# Patient Record
Sex: Female | Born: 1935
Health system: Southern US, Community
[De-identification: ages and names within clinical notes are randomized; demographics above are authoritative.]

## PROBLEM LIST (undated history)

## (undated) DIAGNOSIS — R9430 Abnormal result of cardiovascular function study, unspecified: Secondary | ICD-10-CM

## (undated) DIAGNOSIS — I639 Cerebral infarction, unspecified: Secondary | ICD-10-CM

## (undated) DIAGNOSIS — M1990 Unspecified osteoarthritis, unspecified site: Secondary | ICD-10-CM

## (undated) DIAGNOSIS — T4145XA Adverse effect of unspecified anesthetic, initial encounter: Secondary | ICD-10-CM

## (undated) DIAGNOSIS — R001 Bradycardia, unspecified: Secondary | ICD-10-CM

## (undated) DIAGNOSIS — IMO0002 Reserved for concepts with insufficient information to code with codable children: Secondary | ICD-10-CM

## (undated) DIAGNOSIS — I219 Acute myocardial infarction, unspecified: Secondary | ICD-10-CM

## (undated) DIAGNOSIS — Z5189 Encounter for other specified aftercare: Secondary | ICD-10-CM

## (undated) DIAGNOSIS — S301XXA Contusion of abdominal wall, initial encounter: Secondary | ICD-10-CM

## (undated) DIAGNOSIS — K222 Esophageal obstruction: Secondary | ICD-10-CM

## (undated) DIAGNOSIS — I4510 Unspecified right bundle-branch block: Secondary | ICD-10-CM

## (undated) DIAGNOSIS — M4800 Spinal stenosis, site unspecified: Secondary | ICD-10-CM

## (undated) DIAGNOSIS — I350 Nonrheumatic aortic (valve) stenosis: Secondary | ICD-10-CM

## (undated) DIAGNOSIS — R0602 Shortness of breath: Secondary | ICD-10-CM

## (undated) DIAGNOSIS — M353 Polymyalgia rheumatica: Secondary | ICD-10-CM

## (undated) DIAGNOSIS — K862 Cyst of pancreas: Secondary | ICD-10-CM

## (undated) DIAGNOSIS — R0989 Other specified symptoms and signs involving the circulatory and respiratory systems: Secondary | ICD-10-CM

## (undated) DIAGNOSIS — Z01818 Encounter for other preprocedural examination: Secondary | ICD-10-CM

## (undated) DIAGNOSIS — K219 Gastro-esophageal reflux disease without esophagitis: Secondary | ICD-10-CM

## (undated) DIAGNOSIS — E785 Hyperlipidemia, unspecified: Secondary | ICD-10-CM

## (undated) DIAGNOSIS — N289 Disorder of kidney and ureter, unspecified: Secondary | ICD-10-CM

## (undated) DIAGNOSIS — C801 Malignant (primary) neoplasm, unspecified: Secondary | ICD-10-CM

## (undated) DIAGNOSIS — D649 Anemia, unspecified: Secondary | ICD-10-CM

## (undated) DIAGNOSIS — IMO0001 Reserved for inherently not codable concepts without codable children: Secondary | ICD-10-CM

## (undated) DIAGNOSIS — I10 Essential (primary) hypertension: Secondary | ICD-10-CM

## (undated) DIAGNOSIS — I2510 Atherosclerotic heart disease of native coronary artery without angina pectoris: Secondary | ICD-10-CM

## (undated) DIAGNOSIS — T8859XA Other complications of anesthesia, initial encounter: Secondary | ICD-10-CM

## (undated) HISTORY — DX: Reserved for concepts with insufficient information to code with codable children: IMO0002

## (undated) HISTORY — DX: Other specified symptoms and signs involving the circulatory and respiratory systems: R09.89

## (undated) HISTORY — DX: Gastro-esophageal reflux disease without esophagitis: K21.9

## (undated) HISTORY — PX: ABDOMINAL HYSTERECTOMY: SHX81

## (undated) HISTORY — DX: Essential (primary) hypertension: I10

## (undated) HISTORY — DX: Unspecified osteoarthritis, unspecified site: M19.90

## (undated) HISTORY — DX: Spinal stenosis, site unspecified: M48.00

## (undated) HISTORY — DX: Nonrheumatic aortic (valve) stenosis: I35.0

## (undated) HISTORY — DX: Hyperlipidemia, unspecified: E78.5

## (undated) HISTORY — DX: Cyst of pancreas: K86.2

## (undated) HISTORY — DX: Encounter for other preprocedural examination: Z01.818

## (undated) HISTORY — PX: APPENDECTOMY: SHX54

## (undated) HISTORY — PX: BACK SURGERY: SHX140

## (undated) HISTORY — PX: DILATION AND CURETTAGE OF UTERUS: SHX78

## (undated) HISTORY — DX: Disorder of kidney and ureter, unspecified: N28.9

## (undated) HISTORY — PX: LUMBAR SPINE SURGERY: SHX701

## (undated) HISTORY — DX: Contusion of abdominal wall, initial encounter: S30.1XXA

## (undated) HISTORY — DX: Atherosclerotic heart disease of native coronary artery without angina pectoris: I25.10

## (undated) HISTORY — DX: Abnormal result of cardiovascular function study, unspecified: R94.30

## (undated) HISTORY — DX: Esophageal obstruction: K22.2

## (undated) HISTORY — PX: CORONARY ANGIOPLASTY WITH STENT PLACEMENT: SHX49

## (undated) HISTORY — DX: Polymyalgia rheumatica: M35.3

## (undated) HISTORY — DX: Unspecified right bundle-branch block: I45.10

---

## 1968-09-21 HISTORY — PX: TUBAL LIGATION: SHX77

## 1985-01-21 HISTORY — PX: CHOLECYSTECTOMY: SHX55

## 2006-09-15 ENCOUNTER — Ambulatory Visit: Payer: Self-pay | Admitting: Cardiology

## 2008-02-05 ENCOUNTER — Ambulatory Visit: Payer: Self-pay | Admitting: Urgent Care

## 2008-02-05 LAB — CONVERTED CEMR LAB
Alkaline Phosphatase: 31 units/L — ABNORMAL LOW (ref 39–117)
Basophils Absolute: 0 10*3/uL (ref 0.0–0.1)
Bilirubin, Direct: 0.1 mg/dL (ref 0.0–0.3)
Hemoglobin: 13.7 g/dL (ref 12.0–15.0)
Indirect Bilirubin: 0.4 mg/dL (ref 0.0–0.9)
Lymphocytes Relative: 38 % (ref 12–46)
Monocytes Absolute: 0.7 10*3/uL (ref 0.1–1.0)
Neutro Abs: 5.1 10*3/uL (ref 1.7–7.7)
RBC: 4.53 M/uL (ref 3.87–5.11)
RDW: 13.9 % (ref 11.5–15.5)
Total Protein: 7.1 g/dL (ref 6.0–8.3)

## 2008-02-15 ENCOUNTER — Encounter: Payer: Self-pay | Admitting: Gastroenterology

## 2008-02-15 ENCOUNTER — Ambulatory Visit: Payer: Self-pay | Admitting: Gastroenterology

## 2008-02-15 ENCOUNTER — Ambulatory Visit (HOSPITAL_COMMUNITY): Admission: RE | Admit: 2008-02-15 | Discharge: 2008-02-15 | Payer: Self-pay | Admitting: Gastroenterology

## 2008-03-09 ENCOUNTER — Telehealth (INDEPENDENT_AMBULATORY_CARE_PROVIDER_SITE_OTHER): Payer: Self-pay

## 2008-03-11 ENCOUNTER — Encounter: Payer: Self-pay | Admitting: Gastroenterology

## 2008-03-14 ENCOUNTER — Telehealth (INDEPENDENT_AMBULATORY_CARE_PROVIDER_SITE_OTHER): Payer: Self-pay

## 2008-03-18 ENCOUNTER — Encounter: Payer: Self-pay | Admitting: Gastroenterology

## 2008-03-18 ENCOUNTER — Telehealth (INDEPENDENT_AMBULATORY_CARE_PROVIDER_SITE_OTHER): Payer: Self-pay

## 2008-03-31 ENCOUNTER — Telehealth: Payer: Self-pay | Admitting: Urgent Care

## 2008-04-04 ENCOUNTER — Telehealth (INDEPENDENT_AMBULATORY_CARE_PROVIDER_SITE_OTHER): Payer: Self-pay | Admitting: *Deleted

## 2008-04-19 ENCOUNTER — Encounter: Payer: Self-pay | Admitting: Gastroenterology

## 2008-05-06 ENCOUNTER — Encounter: Payer: Self-pay | Admitting: Gastroenterology

## 2008-08-24 ENCOUNTER — Encounter: Payer: Self-pay | Admitting: Gastroenterology

## 2009-01-21 HISTORY — PX: CORONARY ANGIOPLASTY WITH STENT PLACEMENT: SHX49

## 2010-01-24 ENCOUNTER — Encounter: Payer: Self-pay | Admitting: Gastroenterology

## 2010-02-21 HISTORY — PX: KNEE ARTHROSCOPY: SHX127

## 2010-02-22 NOTE — Letter (Signed)
Summary: NOTE FROM [facility]  NOTE FROM [facility]   Imported By: Rexene Alberts 01/24/2010 14:10:24  _____________________________________________________________________  External Attachment:    Type:   Image     Comment:   External Document

## 2010-03-07 ENCOUNTER — Other Ambulatory Visit (HOSPITAL_COMMUNITY): Payer: Self-pay | Admitting: Orthopaedic Surgery

## 2010-03-07 ENCOUNTER — Encounter (HOSPITAL_COMMUNITY)
Admission: RE | Admit: 2010-03-07 | Discharge: 2010-03-07 | Disposition: A | Discharge status: Home / Self Care | Payer: Medicare Other | Source: Ambulatory Visit | Attending: Orthopaedic Surgery | Admitting: Orthopaedic Surgery

## 2010-03-07 DIAGNOSIS — Z01818 Encounter for other preprocedural examination: Secondary | ICD-10-CM | POA: Insufficient documentation

## 2010-03-07 DIAGNOSIS — I10 Essential (primary) hypertension: Secondary | ICD-10-CM

## 2010-03-07 DIAGNOSIS — Z01812 Encounter for preprocedural laboratory examination: Secondary | ICD-10-CM | POA: Insufficient documentation

## 2010-03-07 DIAGNOSIS — Z01810 Encounter for preprocedural cardiovascular examination: Secondary | ICD-10-CM | POA: Insufficient documentation

## 2010-03-07 LAB — DIFFERENTIAL
Eosinophils Absolute: 0.1 10*3/uL (ref 0.0–0.7)
Lymphocytes Relative: 29 % (ref 12–46)
Lymphs Abs: 2.3 10*3/uL (ref 0.7–4.0)
Monocytes Relative: 4 % (ref 3–12)
Neutro Abs: 5.2 10*3/uL (ref 1.7–7.7)
Neutrophils Relative %: 66 % (ref 43–77)

## 2010-03-07 LAB — URINE MICROSCOPIC-ADD ON

## 2010-03-07 LAB — CBC
HCT: 39.2 % (ref 36.0–46.0)
Hemoglobin: 13.4 g/dL (ref 12.0–15.0)
MCH: 30.8 pg (ref 26.0–34.0)
MCV: 90.1 fL (ref 78.0–100.0)
RBC: 4.35 MIL/uL (ref 3.87–5.11)

## 2010-03-07 LAB — COMPREHENSIVE METABOLIC PANEL
ALT: 9 U/L (ref 0–35)
AST: 18 U/L (ref 0–37)
Albumin: 4 g/dL (ref 3.5–5.2)
CO2: 25 mEq/L (ref 19–32)
Calcium: 9.6 mg/dL (ref 8.4–10.5)
GFR calc Af Amer: 42 mL/min — ABNORMAL LOW (ref >60)
Sodium: 139 mEq/L (ref 135–145)
Total Protein: 6.8 g/dL (ref 6.0–8.3)

## 2010-03-07 LAB — URINALYSIS, ROUTINE W REFLEX MICROSCOPIC
Hgb urine dipstick: NEGATIVE
Protein, ur: NEGATIVE mg/dL
Urine Glucose, Fasting: 250 mg/dL — AB

## 2010-03-08 ENCOUNTER — Observation Stay (HOSPITAL_COMMUNITY)
Admission: RE | Admit: 2010-03-08 | Discharge: 2010-03-08 | Disposition: A | Discharge status: Home / Self Care | Payer: Medicare Other | Source: Ambulatory Visit | Attending: Orthopaedic Surgery | Admitting: Orthopaedic Surgery

## 2010-03-08 DIAGNOSIS — Z01818 Encounter for other preprocedural examination: Secondary | ICD-10-CM | POA: Insufficient documentation

## 2010-03-08 DIAGNOSIS — Z79899 Other long term (current) drug therapy: Secondary | ICD-10-CM | POA: Insufficient documentation

## 2010-03-08 DIAGNOSIS — M23329 Other meniscus derangements, posterior horn of medial meniscus, unspecified knee: Secondary | ICD-10-CM | POA: Insufficient documentation

## 2010-03-08 DIAGNOSIS — Z01812 Encounter for preprocedural laboratory examination: Secondary | ICD-10-CM | POA: Insufficient documentation

## 2010-03-08 DIAGNOSIS — M1710 Unilateral primary osteoarthritis, unspecified knee: Principal | ICD-10-CM | POA: Insufficient documentation

## 2010-03-08 DIAGNOSIS — Z01810 Encounter for preprocedural cardiovascular examination: Secondary | ICD-10-CM | POA: Insufficient documentation

## 2010-03-08 LAB — GLUCOSE, CAPILLARY
Glucose-Capillary: 182 mg/dL — ABNORMAL HIGH (ref 70–99)
Glucose-Capillary: 171 mg/dL — ABNORMAL HIGH (ref 70–99)

## 2010-03-20 NOTE — Op Note (Signed)
NAMECHARLISE, GIOVANETTI NO.:  [id number]  MEDICAL RECORD NO.:  [id number]           PATIENT TYPE:  I  LOCATION:  2550                         FACILITY:  [facility]  PHYSICIAN:  Claude Manges. Kashawna Manzer, M.D.DATE OF BIRTH:  1933/05/31  DATE OF PROCEDURE:  03/08/2010 DATE OF DISCHARGE:  03/08/2010                              OPERATIVE REPORT   PREOPERATIVE DIAGNOSIS:  Tear of medial meniscus right knee with tricompartmental degenerative joint disease.  POSTOPERATIVE DIAGNOSIS:  Tear of medial meniscus right knee with tricompartmental degenerative joint disease.  PROCEDURE: 1. Diagnostic arthroscopy, right knee. 2. Partial medial meniscectomy. 3. Chondroplasty of medial femoral condyle.  SURGEON:  Claude Manges. Cleophas Dunker, MD  ANESTHESIA:  General laryngeal.  COMPLICATIONS:  None.  HISTORY:  This 77 year old female has been experiencing pain in her right knee for over a year.  She did have an MRI scan in spring of 2011 revealing a large flap tear of the medial meniscus.  For medical reasons, she "held off" on considering surgery.  She has had a cardiac stent inserted in [place], [place], and has been on Plavix.  She is also on 3 mg of prednisone every day for chronic polymyalgia rheumatica. She has reached a point where she is having considerable compromise for her activities based on her knee pain and now wants to proceed with surgery.  PROCEDURE:  Ms. Stribling was met in the holding area.  I did mark the right knee as the appropriate operative joint.  The patient was then transported to room #10 and placed under general laryngeal anesthesia without difficulty.  The right lower extremity was then placed in a thigh holder.  Leg was prepped with DuraPrep from the thigh holder to the ankle.  Sterile draping was performed.  Xylocaine 2% with epinephrine was injected into the knee for arthroscopic portals on either side of the patellar tendon.  After waiting  approximately 5 minutes, two puncture sites were then made with little if any bleeding.  The arthroscope was placed in the lateral portal.  Diagnostic arthroscopy revealed a very minimal clear yellow joint effusion.  There was minimal synovitis in the superior pouch, some mild chondromalacia of the patella, probably representing grade 1.  I did not see any loose bodies.  There was a leak in the superior pouch medially, but it was thin without evidence of inflammatory change, it was left alone.  In the medial compartment, there was an obvious tear of the medial meniscus extending from its root to the junction of the middle and posterior thirds.  It was a combination of flap tear and horizontal cleavage tears.  These were debrided with a basket forceps, including shaver, and then tapered and finished with the ArthroCare wand.  I then carefully probed the remaining meniscus without evidence of loose material.  There was considerable chondromalacia of the femoral condyle. The articular cartilage in the medial femoral condyle was friable. There were large flap tears which I debrided down to several layers where there was some exposed subchondral bone.  These were then debrided so that they were bleeding.  Any loose material was then removed from the joint.  There  was minimal synovitis in the intercondylar notch.  The ACL was intact.  There was a small area of tearing of the lateral meniscus at the junction of the middle and posterior thirds, this was debrided. There was abundant calcium pyrophosphate deposition within both menisci, but again little obvious synovitis.  There was just grade 1 chondromalacia of the lateral compartment.  The joint was then explored without evidence of loose material. Puncture sites were left open and infiltrated with 1% Xylocaine with epinephrine.  A sterile bulky dressing was applied, followed by an Ace bandage.  PLAN:  Hydrocodone for pain, crutches,  office next Wednesday.  The patient can return taking her prednisone and her Plavix.  She was not given a boost of the prednisone.  She did take her medicines early this morning and did not have any problems with anesthesia.     Claude Manges. Cleophas Dunker, M.D.     PWW/MEDQ  D:  03/08/2010  T:  03/09/2010  Job:  [id number]  Electronically Signed by Norlene Campbell M.D. on 03/20/2010 11:17:03 AM

## 2010-05-17 DIAGNOSIS — R079 Chest pain, unspecified: Secondary | ICD-10-CM

## 2010-06-05 NOTE — Consult Note (Signed)
NAME:  Sharon Hubbard, Sharon Hubbard NO.:  [id number]   MEDICAL RECORD NO.:  [id number]          PATIENT TYPE:  AMB   LOCATION:  DAY                           FACILITY:  [facility]   PHYSICIAN:  Kassie Mends, M.D.      DATE OF BIRTH:  [birth year]   DATE OF CONSULTATION:  DATE OF DISCHARGE:                                 CONSULTATION   REQUESTING PHYSICIAN:  [name], MD   CHIEF COMPLAINT:  Upper abdominal pain for 3 months/abnormal pancreas on  imaging.Marland Kitchen   HISTORY OF PRESENT ILLNESS:  Ms. Sharon Hubbard is a 77 year old Caucasian  female.  Approximately 3 months ago, she developed some upper abdominal  pain.  It started as right upper quadrant stabbing.  She had some nausea  with it.  She felt the most intense soreness below her right ribs.  She  describes as gnawing-type pain and then radiated to her left lower  quadrant.  She did describe the pain 7/10, is intermittent.  It usually  lasts about 1 hour.  It was usually worse postprandially.  She has had  significant bloating postprandially.  She is having some loose stools as  well about twice per week.  She has gained about 25 pounds in the last  15 months and she is diagnosed with PMR.  Her appetite remains good.  She can go anywhere from having a bowel movement every other day to five  loose stools per day.  She also notices a flushing sensation when she  has these episodes of pain.  She has had some heartburn, some  indigestion despite being on omeprazole 20 mg b.i.d.  She also has had  some dysphagia to solid foods.  She denies any odynophagia.  The  dysphagia has seemed to resolve some with the Prilosec.  She has noticed  significant fatigue.  She denies any rectal bleeding or melena.  Denies  any fever but has had some chills.   On January 04, 2008, she had an abdominal ultrasound, which showed a  hypodense area, which had been previously seen in the body of the  pancreas on a study from September 15, 2006.  On that study,  there was some  internal echoes identified.  It measured slightly larger than what was  seen on the CT previously.  It measures 2.9 x 1.7 x 1.27 and has  slightly lobulated contours.  It appears to be a cystic mass contiguous  with the anterior aspect of the body of the pancreas.  This was followed  up with an MRI of the abdomen with and without contrast on January 19, 2008.  The lesion measures 2.6 x 1.6 cm and there were small cystic  areas in the pancreas adjacent to it.  There was no worrisome MR  features demonstrated and followup was recommended in 6 months.   PAST MEDICAL AND SURGICAL HISTORY:  History of herpes zoster.  She has  had 2 CVAs.  She has PMR seizures, hemorrhoidectomy, GERD.  She had a  cholecystectomy 20 years ago.  She tells me she had a colonoscopy by Dr.  Linna Darner about 2  years ago.  She has had surgery on her back for ruptured  disk.  She has had appendectomy and a complete hysterectomy.   CURRENT MEDICATIONS:  1. Prednisone 5 mg daily.  2. Depakote 500 mg q.a.m., 1 g q.p.m.  3. Metformin/glyburide 5/500 mg b.i.d.  4. Humalog sliding scale insulin p.r.n.  5. Levemir injections 20 units q.a.m.  6. Lotrel 5/10 mg daily.  7. Hydrochlorothiazide 25 mg daily.  8. Zocor 40 mg daily.  9. Omeprazole 20 mg b.i.d.  10.Aspirin 375 mg at bedtime.  11.Vitamin D 1000 international units daily.  12.Coenzyme Q10 50 mg daily.   ALLERGIES:  Unknown.   FAMILY HISTORY:  Mother deceased at 22 secondary to uterine cancer.  Father deceased at 81 secondary to a cervical fracture.  She has lost  two brothers, has one healthy sisters.   SOCIAL HISTORY:  Ms. Sharon Hubbard is married.  She has five healthy  children.  She has a caregiver.  She denies any tobacco, alcohol, or  drug use.   REVIEW OF SYSTEMS:  See HPI.  MUSCULOSKELETAL:  She does have severe  joint pain with her PMR as well as fatigue and myalgias, otherwise  negative review of systems.   PHYSICAL EXAMINATION:  VITAL  SIGNS:  Weight 164 pounds, height 63-1/2  inches, temperature 97.6, blood pressure 120/82, and pulse 60.  GENERAL:  She is a well-developed, well-nourished Caucasian female who  is alert, oriented, pleasant, and cooperative in no acute distress.  HEENT:  Sclerae clear, nonicteric.  Conjunctivae pink.  Oropharynx pink  and moist without lesions.  NECK:  Supple without thyromegaly.  CHEST:  Heart regular rate and rhythm.  Normal S1 and S2 without  murmurs, rubs, or gallops.  LUNGS:  Clear to auscultation bilaterally.  ABDOMEN:  Positive bowel sounds x4.  No bruits auscultated.  Soft,  nontender, without palpable mass or hepatosplenomegaly. No rebound,  tenderness or guarding.  EXTREMITIES:  Without clubbing or edema.  SKIN:  Pink, warm, and dry without any rash or jaundice.   IMPRESSION:  Ms. Sharon Hubbard is a 77 year old Caucasian female who has had  a 74-month history of upper abdominal pain along with some left lower  quadrant abdominal pain, which is intermittent usually worse after  eating along with significant bloating and nausea.  She also has been  found to have a cystic lesion in the pancreas, which may be an  incidental finding.  There is no history of pancreatitis, however, it  appears that this cystic lesion has been present possibly slightly  enlarged in the last year and a half.  Her dyspepsia and nausea could be  due to an unrelated process, such as peptic ulcer disease or gastritis,  and I do feel she is going to need further evaluation of her upper  gastrointestinal tract.  Pancreatic lesion is going to need further  followup as well.  She has also had solid food dysphagia, which has  resolved somewhat with PPI; however, I suspect she may have complicated  gastroesophageal reflux disease including development of esophageal  web/ring or stricture.   PLAN:  1. We will request films on CD including the CT from August 2008,      ultrasound, and MRI for further review with  Dr. Cira Servant and other      radiologist.  2. EGD with possible esophageal dilatation with Dr. Cira Servant in near      future.  I have discussed the procedure including risks and  benefits to include, but not limited to bleeding, infection,      perforation, or drug reaction.  She agrees with the plan and      consent will be obtained.  3. CBC, LFTs, amylase, and lipase today.  4. We will request colonoscopy report from Dr. Linna Darner at [facility] approximately 2 years ago.   We would like to thank Dr. [name] for allowing [place] to participate in the  care of Ms. Merilynn Finland.      Lorenza Burton, N.P.      Kassie Mends, M.D.  Electronically Signed    KJ/MEDQ  D:  02/06/2008  T:  02/07/2008  Job:  [id number]   cc:   [name]  Fax: [phone]

## 2010-06-05 NOTE — Op Note (Signed)
Sharon Hubbard, Sharon Hubbard           ACCOUNT NO.:  [id number]   MEDICAL RECORD NO.:  [id number]          PATIENT TYPE:  AMB   LOCATION:  DAY                           FACILITY:  [facility]   PHYSICIAN:  Kassie Mends, M.D.      DATE OF BIRTH:  [birth year]   DATE OF PROCEDURE:  02/15/2008  DATE OF DISCHARGE:                               OPERATIVE REPORT   REFERRING PHYSICIAN:  [name], MD   PROCEDURE:  Esophagogastroduodenoscopy with cold forceps biopsy and  Savary dilation to 16 mm.   INDICATION FOR EXAM:  Ms. Sharon Hubbard is a 78 year old female who has  polymyalgia rheumatica and a history of herpes zoster on the right  flank.  She had a cholecystectomy 20 years ago and states that she had a  bile duct which was full of sand.  She presents with solid dysphagia  and right upper quadrant abdominal pain.  She had a CT scan which showed  a 2.6 x 1.6 cm cyst in the pancreas.  She had no worrisome features and  the recommendation was for a follow-up MRI.  She is chronically  maintained on prednisone, Depakote, metformin, Humalog, Levemir, Lotrel,  HCTZ,  Zocor, omeprazole 20 mg b.i.d., aspirin 325 mg at bedtime,  vitamin D and co-enzyme Q10.   FINDINGS:  1. Distal Schatzki's ring which narrowed the lumen to approximately 13      mm.  Otherwise no evidence of Barrett's, mass, erosions, or      ulcerations.  2. Patchy erythema in the antrum.  Biopsies obtained via cold forceps.      No erosions or ulcerations seen.  3. Normal duodenal bulb and second portion of the duodenum.   DIAGNOSES:  1. Distal Schatzki's ring.  2. Moderate gastritis.  3. No source for right upper right upper quadrant pain identified.      The differential diagnosis includes common bile duct stones or      postherpetic neuralgia.   RECOMMENDATIONS:  1. She should continue the omeprazole twice daily.  The gastritis is      likely secondary to prednisone and aspirin use.  2. She is to stop her aspirin and restart it  on February 21, 2008.  3. Will call for the results of her biopsies.  4. She is to follow up with [facility] in 6 weeks.  5. No NSAIDs or anticoagulation for 5 days.  6. She is given a handout on gastritis.  7. Schedule endoscopic ultrasound with Dr. Odelia Gage regarding her      pancreatic cyst and right upper quadrant pain with history of      sludge.   MEDICATIONS:  1. Demerol 100 mg IV.  2. Versed 5 mg IV.   PROCEDURE TECHNIQUE:  Physical exam was performed.  Informed consent was  obtained from the patient after explaining the benefits, risks and  alternatives to the procedure.  The patient was connected to monitor and  placed in left lateral position.  Continuous oxygen was provided by  nasal cannula and IV medicine administered through an indwelling  cannula.  After administration of sedation, the patient's esophagus  was  intubated.  The scope was advanced under direct visualization to the  second portion of the duodenum.  The scope was removed slowly by  carefully examining the color, texture, anatomy and integrity of the  mucosa on the way out. Prior to removal of the scope the guidewire was  advanced into the antrum. The scope was withdrawn. Each dilator was  passed over the wire from 13-16 mm. The dilator and guidewire were  removed. The patient was recovered in endoscopy and discharged home in  satisfactory condition.   LABORATORY DATA:  February 05, 2008:  White count 9.6, hemoglobin 13.7  platelets 165, total bilirubin 0.5, alk phos 31 (39 to 117), AST 15, ALT  12, albumin 4.38, amylase 107 (0 to 105) lipase 70 (0 to 75).      Kassie Mends, M.D.  Electronically Signed     SM/MEDQ  D:  02/15/2008  T:  02/15/2008  Job:  [id number]   cc:   Willis Modena, MD   [name]  Fax: [phone]

## 2010-06-07 ENCOUNTER — Encounter: Payer: Self-pay | Admitting: Cardiology

## 2010-06-08 ENCOUNTER — Encounter: Payer: Self-pay | Admitting: Cardiology

## 2010-06-08 DIAGNOSIS — I10 Essential (primary) hypertension: Secondary | ICD-10-CM | POA: Insufficient documentation

## 2010-06-08 DIAGNOSIS — M353 Polymyalgia rheumatica: Secondary | ICD-10-CM | POA: Insufficient documentation

## 2010-06-08 DIAGNOSIS — I4510 Unspecified right bundle-branch block: Secondary | ICD-10-CM | POA: Insufficient documentation

## 2010-06-08 DIAGNOSIS — E1165 Type 2 diabetes mellitus with hyperglycemia: Secondary | ICD-10-CM | POA: Insufficient documentation

## 2010-06-08 DIAGNOSIS — I2510 Atherosclerotic heart disease of native coronary artery without angina pectoris: Secondary | ICD-10-CM | POA: Insufficient documentation

## 2010-06-08 DIAGNOSIS — K219 Gastro-esophageal reflux disease without esophagitis: Secondary | ICD-10-CM | POA: Insufficient documentation

## 2010-06-08 DIAGNOSIS — R9430 Abnormal result of cardiovascular function study, unspecified: Secondary | ICD-10-CM | POA: Insufficient documentation

## 2010-06-08 DIAGNOSIS — E785 Hyperlipidemia, unspecified: Secondary | ICD-10-CM | POA: Insufficient documentation

## 2010-06-08 DIAGNOSIS — R001 Bradycardia, unspecified: Secondary | ICD-10-CM | POA: Insufficient documentation

## 2010-06-11 ENCOUNTER — Ambulatory Visit (INDEPENDENT_AMBULATORY_CARE_PROVIDER_SITE_OTHER): Payer: Medicare Other | Admitting: Cardiology

## 2010-06-11 ENCOUNTER — Encounter: Payer: Self-pay | Admitting: Cardiology

## 2010-06-11 DIAGNOSIS — I2510 Atherosclerotic heart disease of native coronary artery without angina pectoris: Secondary | ICD-10-CM

## 2010-06-11 DIAGNOSIS — Z01818 Encounter for other preprocedural examination: Secondary | ICD-10-CM

## 2010-06-11 DIAGNOSIS — I498 Other specified cardiac arrhythmias: Secondary | ICD-10-CM

## 2010-06-11 DIAGNOSIS — I10 Essential (primary) hypertension: Secondary | ICD-10-CM

## 2010-06-11 DIAGNOSIS — R001 Bradycardia, unspecified: Secondary | ICD-10-CM

## 2010-06-11 DIAGNOSIS — K862 Cyst of pancreas: Secondary | ICD-10-CM | POA: Insufficient documentation

## 2010-06-11 NOTE — Progress Notes (Signed)
HPI The patient is seen today post hospitalization and also for clearance for back surgery.  She was recently hospitalized at [facility] in April, 2012.  At that time she had some chest discomfort.  There was no evidence of myocardial ischemia.  She has a history of having had a drug-eluting stent in [place] in the past.  This was done in April, 2011.  She has been on aspirin and Plavix since that period.  During her recent hospitalization echocardiogram revealed normal left ventricular function with an ejection fraction of 60%.  Stress nuclear scan revealed no scar or ischemia.  She was discharged home when she's done well since. Allergies  Allergen Reactions  . Clarithromycin     REACTION: Vomiting and diarrhea.    Current Outpatient Prescriptions  Medication Sig Dispense Refill  . amitriptyline (ELAVIL) 10 MG tablet Take 10 mg by mouth at bedtime.        Marland Kitchen amLODipine (NORVASC) 10 MG tablet Take 10 mg by mouth daily.        Marland Kitchen aspirin 81 MG tablet Take 81 mg by mouth daily.        . clopidogrel (PLAVIX) 75 MG tablet Take 75 mg by mouth daily.        . divalproex (DEPAKOTE) 500 MG 24 hr tablet Take 500 mg by mouth daily.        . fish oil-omega-3 fatty acids 1000 MG capsule Take 1 g by mouth 2 (two) times daily.        Marland Kitchen glimepiride (AMARYL) 4 MG tablet Take 4 mg by mouth 2 (two) times daily.        . hydrochlorothiazide 25 MG tablet Take 25 mg by mouth daily.        . insulin detemir (LEVEMIR) 100 UNIT/ML injection Inject into the skin as needed.        . latanoprost (XALATAN) 0.005 % ophthalmic solution Place 1 drop into both eyes at bedtime.        Marland Kitchen lisinopril (PRINIVIL,ZESTRIL) 20 MG tablet Take 20 mg by mouth daily.        Marland Kitchen lovastatin (MEVACOR) 20 MG tablet Take 20 mg by mouth at bedtime.        . pantoprazole (PROTONIX) 40 MG tablet Take 40 mg by mouth daily.        . predniSONE (DELTASONE) 1 MG tablet Take 1 mg by mouth 3 (three) times daily.        . timolol (TIMOPTIC) 0.5 %  ophthalmic solution Place 1 drop into both eyes daily.        Marland Kitchen DISCONTD: amoxicillin (AMOXIL) 500 MG capsule Take 500 mg by mouth 3 (three) times daily.        Marland Kitchen DISCONTD: levofloxacin (LEVAQUIN) 500 MG tablet Take 500 mg by mouth daily.          History   Social History  . Marital Status: Married    Spouse Name: N/A    Number of Children: N/A  . Years of Education: N/A   Occupational History  . Not on file.   Social History Main Topics  . Smoking status: Never Smoker   . Smokeless tobacco: Never Used  . Alcohol Use: Not on file  . Drug Use: Not on file  . Sexually Active: Not on file   Other Topics Concern  . Not on file   Social History Narrative  . No narrative on file    No family history on file.  Past Medical  History  Diagnosis Date  . CAD (coronary artery disease)     DES  ostial circumflex., April, 2011, Dr.Zachary, [place], residual 40% LAD and 50% diagonal  /  nuclear. April, 2012, no scar or ischemia  . RBBB (right bundle branch block)     old  . Chest pain     [facility], April, 2012, atypical pain, nuclear no scar or ischemia  . Groin hematoma     April, 2011  . Hypertension   . Diabetes mellitus   . Dyslipidemia   . GERD (gastroesophageal reflux disease)   . Schatzki's ring   . Renal insufficiency   . Polymyalgia rheumatica   . Osteoarthritis     arthroscopic surgery right knee February, 2012  . Spinal stenosis     history of surgery for this  . Bradycardia   . Ejection fraction     EF 65%, echo, April, 2012, aortic valve sclerosis with very slight gradient  . Preoperative evaluation to rule out surgical contraindication     Patient needs back surgery by Dr. Channing Mutters, May, 2012                  No past surgical history on file.  ROS  Patient denies fever, chills, headache, sweats, rash, change in vision, change in hearing, chest pain, cough, nausea vomiting, urinary symptoms.  All other systems are reviewed and are  negative.  PHYSICAL EXAM Patient looks good today.  She is oriented to person time and place.  Affect is normal.  She is here with her son.  Head is atraumatic.  There is no xanthelasma.  There is no carotid bruit.  There is no jugular venous distention.  Lungs are clear.  Respiratory effort is nonlabored.  Cardiac exam reveals S1 and S2.  There is a soft systolic murmur.  The abdomen is soft.  There is no peripheral edema.  There are no musculoskeletal deformities.  There are no skin rashes. Filed Vitals:   06/11/10 1028  BP: 148/78  Pulse: 60  Height: 5\' 4"  (1.626 m)  Weight: 158 lb (71.668 kg)    EKG EKG is not done today as the patient had EKGs recently in the hospital.  ASSESSMENT & PLAN

## 2010-06-11 NOTE — Patient Instructions (Signed)
Continue all current medications.  Your physician wants you to follow up in: 6 months.  You will receive a reminder letter in the mail one-two months in advance.  If you don't receive a letter, please call our office to schedule the follow up appointment  Clear for surgery with Dr. Channing Mutters.

## 2010-06-11 NOTE — Assessment & Plan Note (Signed)
Heart rate is well controlled.  No change in therapy.

## 2010-06-11 NOTE — Assessment & Plan Note (Signed)
Coronary disease is stable.  She was hospitalized in April.  There was no evidence of ischemia.  Ejection fraction is normal.  Nuclear scan reveals no scar or ischemia.  No further workup is needed at this time.

## 2010-06-11 NOTE — Assessment & Plan Note (Signed)
Blood pressure is controlled. No change in therapy. 

## 2010-06-11 NOTE — Assessment & Plan Note (Signed)
The patient is cleared for back surgery.  It will be okay to hold her aspirin and Plavix for 5-7 days before the procedure.  These can then be restarted after her surgery.  I had a long and careful discussion with the patient and her son about this issue.  She had been told originally that she might need Plavix for as long as 3 years.  This is probably related to the type and position of her stent.  However she has been on the drugs for one year after the placement of a drug-eluting stent.  She is very limited by her back pain.  If she and her son decided it is appropriate to proceed with surgery, it is appropriate for her aspirin and Plavix to be held.

## 2010-06-27 NOTE — Discharge Summary (Signed)
  NAMEDEB, LOUDIN NO.:  [id number]  MEDICAL RECORD NO.:  [id number]           PATIENT TYPE:  LOCATION:                                 FACILITY:  PHYSICIAN:  Claude Manges. Cesare Sumlin, M.D.DATE OF BIRTH:  30-Apr-1933  DATE OF ADMISSION: DATE OF DISCHARGE:                              DISCHARGE SUMMARY   DATE OF PROCEDURE:  March 08, 2010.  PREOPERATIVE DIAGNOSIS:  Torn meniscus of right knee with tricompartmental degenerative joint disease.  POSTOPERATIVE DIAGNOSIS:  Torn meniscus of right knee with tricompartmental degenerative joint disease.  PROCEDURE:  Diagnostic arthroscopy of the right knee with partial medial meniscectomy and chondroplasty medial femoral condyle.  This was an outpatient procedure and the patient did well and was discharged on that day in improved condition.     Oris Drone Petrarca, P.A.-C.   ______________________________ Claude Manges. Cleophas Dunker, M.D.    BDP/MEDQ  D:  06/14/2010  T:  06/15/2010  Job:  [id number]  Electronically Signed by Jacqualine Code P.A.-C. on 06/19/2010 08:56:34 AM Electronically Signed by Norlene Campbell M.D. on 06/27/2010 08:47:52 AM

## 2010-09-18 ENCOUNTER — Telehealth: Payer: Self-pay | Admitting: *Deleted

## 2010-09-18 NOTE — Telephone Encounter (Signed)
Pt walked in c/o chest pain. She states this chest pain has woken her up at night. She denies any pain during the day. She states this has been going on for about 5-6 weeks. She states it wakes her up at least 3 times per week. She states it is usually relieved w/NTG x 2 but has had to take 3 NTG before relief several times. She continues to have DOE that she has had since having stent placed. She does not think the SOB is any worse. Pt states she is worried about the chest pain and has thought several times that she should go to the ER for evaluation. Pt is advised to go to ER for ongoing chest pain lasting 5-6 weeks. She is scheduled w/Dr. Myrtis Ser for Sept but is aware to have ER evaluation. Pt verbalized understanding.

## 2010-09-19 ENCOUNTER — Inpatient Hospital Stay (HOSPITAL_COMMUNITY)
Admission: AD | Admit: 2010-09-19 | Discharge: 2010-09-21 | DRG: 247 | Disposition: A | Discharge status: Home / Self Care | Payer: Medicare Other | Source: Other Acute Inpatient Hospital | Attending: Internal Medicine | Admitting: Internal Medicine

## 2010-09-19 DIAGNOSIS — Z9861 Coronary angioplasty status: Secondary | ICD-10-CM

## 2010-09-19 DIAGNOSIS — Z7902 Long term (current) use of antithrombotics/antiplatelets: Secondary | ICD-10-CM

## 2010-09-19 DIAGNOSIS — N183 Chronic kidney disease, stage 3 unspecified: Secondary | ICD-10-CM | POA: Diagnosis present

## 2010-09-19 DIAGNOSIS — E785 Hyperlipidemia, unspecified: Secondary | ICD-10-CM | POA: Diagnosis present

## 2010-09-19 DIAGNOSIS — Z7982 Long term (current) use of aspirin: Secondary | ICD-10-CM

## 2010-09-19 DIAGNOSIS — Z79899 Other long term (current) drug therapy: Secondary | ICD-10-CM

## 2010-09-19 DIAGNOSIS — I498 Other specified cardiac arrhythmias: Secondary | ICD-10-CM | POA: Diagnosis present

## 2010-09-19 DIAGNOSIS — E119 Type 2 diabetes mellitus without complications: Secondary | ICD-10-CM | POA: Diagnosis present

## 2010-09-19 DIAGNOSIS — Z794 Long term (current) use of insulin: Secondary | ICD-10-CM

## 2010-09-19 DIAGNOSIS — R079 Chest pain, unspecified: Secondary | ICD-10-CM

## 2010-09-19 DIAGNOSIS — I2510 Atherosclerotic heart disease of native coronary artery without angina pectoris: Secondary | ICD-10-CM

## 2010-09-19 DIAGNOSIS — I129 Hypertensive chronic kidney disease with stage 1 through stage 4 chronic kidney disease, or unspecified chronic kidney disease: Secondary | ICD-10-CM | POA: Diagnosis present

## 2010-09-19 DIAGNOSIS — I200 Unstable angina: Secondary | ICD-10-CM | POA: Diagnosis present

## 2010-09-19 LAB — GLUCOSE, CAPILLARY: Glucose-Capillary: 195 mg/dL — ABNORMAL HIGH (ref 70–99)

## 2010-09-19 LAB — POCT ACTIVATED CLOTTING TIME: Activated Clotting Time: 386 seconds

## 2010-09-20 DIAGNOSIS — I200 Unstable angina: Secondary | ICD-10-CM

## 2010-09-20 LAB — GLUCOSE, CAPILLARY
Glucose-Capillary: 192 mg/dL — ABNORMAL HIGH (ref 70–99)
Glucose-Capillary: 187 mg/dL — ABNORMAL HIGH (ref 70–99)

## 2010-09-20 LAB — LIPID PANEL
Cholesterol: 286 mg/dL — ABNORMAL HIGH (ref 0–200)
HDL: 61 mg/dL (ref >39)
LDL Cholesterol: UNDETERMINED mg/dL (ref 0–99)
Triglycerides: 506 mg/dL — ABNORMAL HIGH (ref <150)

## 2010-09-20 LAB — CBC
MCH: 30.3 pg (ref 26.0–34.0)
MCHC: 34.3 g/dL (ref 30.0–36.0)
Platelets: 182 10*3/uL (ref 150–400)

## 2010-09-20 LAB — BASIC METABOLIC PANEL
CO2: 24 mEq/L (ref 19–32)
Calcium: 9.8 mg/dL (ref 8.4–10.5)
Chloride: 98 mEq/L (ref 96–112)
Creatinine, Ser: 1.19 mg/dL — ABNORMAL HIGH (ref 0.50–1.10)
Glucose, Bld: 186 mg/dL — ABNORMAL HIGH (ref 70–99)
Sodium: 136 mEq/L (ref 135–145)

## 2010-09-20 LAB — CARDIAC PANEL(CRET KIN+CKTOT+MB+TROPI)
CK, MB: 5.7 ng/mL — ABNORMAL HIGH (ref 0.3–4.0)
Relative Index: INVALID (ref 0.0–2.5)
Troponin I: 0.98 ng/mL (ref <0.30)

## 2010-09-20 LAB — CK TOTAL AND CKMB (NOT AT ARMC): Total CK: 65 U/L (ref 7–177)

## 2010-09-21 LAB — BASIC METABOLIC PANEL
BUN: 19 mg/dL (ref 6–23)
CO2: 28 mEq/L (ref 19–32)
Calcium: 9.7 mg/dL (ref 8.4–10.5)
Chloride: 100 mEq/L (ref 96–112)
Creatinine, Ser: 1.23 mg/dL — ABNORMAL HIGH (ref 0.50–1.10)
Glucose, Bld: 117 mg/dL — ABNORMAL HIGH (ref 70–99)

## 2010-09-21 LAB — GLUCOSE, CAPILLARY
Glucose-Capillary: 196 mg/dL — ABNORMAL HIGH (ref 70–99)
Glucose-Capillary: 108 mg/dL — ABNORMAL HIGH (ref 70–99)
Glucose-Capillary: 221 mg/dL — ABNORMAL HIGH (ref 70–99)

## 2010-10-02 ENCOUNTER — Encounter: Payer: Self-pay | Admitting: Cardiology

## 2010-10-04 ENCOUNTER — Encounter: Payer: Self-pay | Admitting: Cardiology

## 2010-10-05 ENCOUNTER — Ambulatory Visit (INDEPENDENT_AMBULATORY_CARE_PROVIDER_SITE_OTHER): Payer: Medicare Other | Admitting: Cardiology

## 2010-10-05 ENCOUNTER — Encounter: Payer: Self-pay | Admitting: Cardiology

## 2010-10-05 DIAGNOSIS — R001 Bradycardia, unspecified: Secondary | ICD-10-CM

## 2010-10-05 DIAGNOSIS — I498 Other specified cardiac arrhythmias: Secondary | ICD-10-CM

## 2010-10-05 DIAGNOSIS — I10 Essential (primary) hypertension: Secondary | ICD-10-CM

## 2010-10-05 DIAGNOSIS — E785 Hyperlipidemia, unspecified: Secondary | ICD-10-CM

## 2010-10-05 DIAGNOSIS — I2510 Atherosclerotic heart disease of native coronary artery without angina pectoris: Secondary | ICD-10-CM

## 2010-10-05 DIAGNOSIS — Z01818 Encounter for other preprocedural examination: Secondary | ICD-10-CM

## 2010-10-05 MED ORDER — SIMVASTATIN 20 MG PO TABS: 20.0000 mg | ORAL_TABLET | Freq: Every evening | ORAL | Status: DC

## 2010-10-05 NOTE — Assessment & Plan Note (Signed)
Systolic blood pressure is mildly elevated.  This is to be followed with med adjustments as needed.

## 2010-10-05 NOTE — Patient Instructions (Signed)
Follow up as scheduled with Dr. Myrtis Ser. Finish currently supply of Lovastatin and then stop. After stopping Lovastatin, start Zocor (simvastatin) 20 mg every evening.

## 2010-10-05 NOTE — Assessment & Plan Note (Signed)
Fortunately the patient's coronary status is now quite stable.  When I saw her last in May she was not having the ischemic symptoms.  She will remain on her current medications.  I made it clear to her importance of staying on aspirin and Plavix.

## 2010-10-05 NOTE — Progress Notes (Signed)
HPI The patient is seen in followup of coronary disease.  Sharon Hubbard is also seen for followup recent hospitalization.  I saw her last Jun 11, 2010.  At that time Sharon Hubbard was stable.  Sharon Hubbard did not appear to be having ischemic symptoms.  However over time he began to develop marked shortness of breath and chest squeezing with any exercise.  The symptoms continued to worsen and Sharon Hubbard came to our office and was immediately admitted to the hospital.  Our team consulted on her there and decided to send her to [facility].  They're catheterization showed tight LAD disease and Sharon Hubbard received a drug-eluting stent to the proximal/mid LAD.  Sharon Hubbard did very well.  Sharon Hubbard is now back for followup.  Sharon Hubbard's not having exertional shortness of breath.  Sharon Hubbard is to remain on aspirin.  Sharon Hubbard is to remain on Plavix for at least one year if not longer.  As part of today's evaluation I have reviewed her old records.  I will also review the records from [facility] and [facility] including the discharge summary And the catheterization Report. Allergies  Allergen Reactions  . Clarithromycin     REACTION: Vomiting and diarrhea.    Current Outpatient Prescriptions  Medication Sig Dispense Refill  . amitriptyline (ELAVIL) 10 MG tablet Take 10 mg by mouth at bedtime.        Marland Kitchen amLODipine (NORVASC) 10 MG tablet Take 10 mg by mouth daily.        Marland Kitchen aspirin 81 MG tablet Take 81 mg by mouth daily.        . clopidogrel (PLAVIX) 75 MG tablet Take 75 mg by mouth daily.        . divalproex (DEPAKOTE) 500 MG 24 hr tablet Take 500 mg by mouth daily.        . fish oil-omega-3 fatty acids 1000 MG capsule Take 1 g by mouth 2 (two) times daily.        Marland Kitchen glimepiride (AMARYL) 4 MG tablet Take 4 mg by mouth 2 (two) times daily.        . insulin detemir (LEVEMIR) 100 UNIT/ML injection Inject into the skin as needed.        . latanoprost (XALATAN) 0.005 % ophthalmic solution Place 1 drop into both eyes at bedtime.        Marland Kitchen lisinopril (PRINIVIL,ZESTRIL)  20 MG tablet Take 20 mg by mouth daily.        Marland Kitchen lovastatin (MEVACOR) 20 MG tablet Take 20 mg by mouth at bedtime.        . pantoprazole (PROTONIX) 40 MG tablet Take 40 mg by mouth daily.        . predniSONE (DELTASONE) 1 MG tablet Take 1 mg by mouth 3 (three) times daily.        . timolol (TIMOPTIC) 0.5 % ophthalmic solution Place 1 drop into both eyes daily.          History   Social History  . Marital Status: Married    Spouse Name: N/A    Number of Children: N/A  . Years of Education: N/A   Occupational History  . Not on file.   Social History Main Topics  . Smoking status: Never Smoker   . Smokeless tobacco: Never Used  . Alcohol Use: Not on file  . Drug Use: Not on file  . Sexually Active: Not on file   Other Topics Concern  . Not on file   Social History Narrative  .  No narrative on file    No family history on file.  Past Medical History  Diagnosis Date  . CAD (coronary artery disease)     DES  ostial circumflex., April, 2011, Dr.Zachary, [place], residual 40% LAD and 50% diagonal  /  nuclear. April, 2012, no scar or ischemia  . RBBB (right bundle branch block)     old  . Chest pain     [facility], April, 2012, atypical pain, nuclear no scar or ischemia  . Groin hematoma     April, 2011  . Hypertension   . Diabetes mellitus   . Dyslipidemia   . GERD (gastroesophageal reflux disease)   . Schatzki's ring   . Renal insufficiency   . Polymyalgia rheumatica   . Osteoarthritis     arthroscopic surgery right knee February, 2012  . Spinal stenosis     history of surgery for this  . Bradycardia   . Ejection fraction     EF 65%, echo, April, 2012, aortic valve sclerosis with very slight gradient  . Preoperative evaluation to rule out surgical contraindication     Patient needs back surgery by Dr. Channing Mutters, May, 2012                . Pancreas cyst     The patient has multiple pancreatic cysts.  This is followed at [facility]           No past surgical  history on file.  ROS  Patient denies fever, chills, headache, sweats, rash, change in vision, change in hearing, chest pain, cough, nausea vomiting, urinary symptoms.  All of the systems are reviewed and are negative.  PHYSICAL EXAM Patient looks great.  Sharon Hubbard is very excited about how well Sharon Hubbard is feeling.  Sharon Hubbard is oriented to person time and place.  Affect is normal.  Head is atraumatic.  There is no jugular venous distention.  Lungs are clear.  Respiratory effort is unlabored.  Cardiac exam reveals S1 and S2.  There is a soft systolic murmur.  The abdomen is soft.  There is no peripheral edema.  The catheter site in her groin is healed.  There are no musculoskeletal deformities.  No skin rashes. Filed Vitals:   10/05/10 1302  BP: 154/78  Pulse: 65  Resp: 16  Height: 5\' 3"  (1.6 m)  Weight: 165 lb (74.844 kg)    EKG EKG is not done today.  ASSESSMENT & PLAN

## 2010-10-05 NOTE — Assessment & Plan Note (Signed)
She's not having any significant bradycardia.  No further workup.

## 2010-10-05 NOTE — Assessment & Plan Note (Signed)
The patient has been on lovastatin the past.  She was sent home on atorvastatin.  She says that she cannot tolerate this.  She think she can tolerate simvastatin and I will switch her to simvastatin with the appropriate dose adjustment when she runs out of her lovastatin.  We need to try to be as aggressive as possible with her

## 2010-10-05 NOTE — Assessment & Plan Note (Signed)
When I saw the patient in May of 2012 I had decided to clear her for possible back surgery.  She then saw Dr. Channing Mutters who ultimately decided that surgery would not be recommended for her.  It would be a very complex operation with only a relatively small chance of complete success.  Certainly glad that things worked out this way as she might have had a cardiac problem with surgery

## 2010-10-09 NOTE — Cardiovascular Report (Signed)
NAMEMarland Kitchen  Sharon Hubbard, Sharon Hubbard NO.:  [id number]  MEDICAL RECORD NO.:  [id number]  LOCATION:  6524                         FACILITY:  [facility]  PHYSICIAN:  Lorine Bears, MD     DATE OF BIRTH:  [birth year]  DATE OF PROCEDURE:  09/19/2010 DATE OF DISCHARGE:                           CARDIAC CATHETERIZATION   PRIMARY CARDIOLOGIST:  [name], MD, [facility]  REFERRING PHYSICIAN:  Learta Codding, MD, FACC  SURGEON:  Lorine Bears, MD  PROCEDURES PERFORMED: 1. Left heart catheterization. 2. Coronary angiography. 3. Angioplasty and drug-eluting stent placement to the mid left     anterior descending artery for a 95% stenosis which was reduced to     0% stenosis.  ACCESS:  I attempted right radial access.  However, the wire could not be passed in spite of getting good blood return.  This was likely due to a small caliber vessel with spasm.  Thus, the radial approach was aborted and this was done through the right femoral artery approach.  INDICATIONS AND CLINICAL HISTORY:  This is a 77 year old female with known history of coronary artery disease status post angioplasty and drug-eluting stent placement to the mid circumflex in 2011 in [place], [place].  At that time, the procedure was complicated by a large access site bleeding which required at least 4 units of packed RBCs.  It seems that the bleeding complication was actually later occurring and not directly after cardiac catheterization.  The patient presented with progressive symptoms of chest pain and dyspnea mostly happening at night.  She had recurrent presentation to the hospital and due to her previous history, cardiac catheterization was recommended.  Risks, benefits, and alternatives were discussed with the patient.  STUDY DETAILS:  A standard informed consent was obtained.  The right radial and groin area were both prepped in a sterile fashion.  The right radial area was anesthetized with 1% lidocaine.   The radial artery was accessed with an anterior puncture with good blood return.  However, I could not pass two different kinds of wires in spite of multiple attempts.  Thus, I decided to abort the radial approach.  Access through the right femoral artery was gained easily after anterior puncture.  A 5- French sheath was placed in the right femoral artery.  The patient was very sensitive and had significant discomfort with sheath placement. Coronary angiography was performed with a JL-4 and JR-4 catheter.  Left ventricular pressure was recorded with a pigtail catheter, but left ventricular angiography was not performed due to chronic kidney disease and known normal ejection fraction.  Then, it was decided to proceed with angioplasty and stent placement to the mid left anterior descending artery.  INTERVENTIONAL PROCEDURE NOTE:  The 5-French sheath was exchanged to a 6- [place] sheath.  Before that, I performed angiography of the right common femoral artery which showed that the placement of the sheath was above the bifurcation but there was another branch at the entry site.  There was no evidence of bleeding.  The patient was given 150 of Plavix orally.  She has been already on Plavix daily.  She received already 325 mg of aspirin.  She was started on IV bivalirudin with therapeutic  ACT. An XB 3.5 guiding catheter without side holes was used.  The lesion was crossed with a P[place] wire.  It was predilated with a 2.5 x 20 mm Emerge balloon to 8 atmospheres distally and 10 atmospheres proximally. IC nitroglycerin was given.  I then placed a 2.5 x 28 mm Promus Element drug-eluting stent which was placed just distal to the first diagonal branch and the branch was not jailed.  The stent was deployed to 14 atmospheres.  This was postdilated with a 2.75 x 20 mm [place] Quantum balloon to 14 atmospheres distally and 16 atmospheres proximally. Angiography showed excellent results with a step-up and  step-down. There was some mild plaque shift into the ostium of the diagonal but with TIMI 3 flow.  The diagonal was diffusely diseased and it was already determined before the angioplasty that I was not going to intervene in the vessel due to diffuse disease and diameter of 2.0 or less.  The catheter was then removed over a wire.  The sheath was sutured in place to be removed manually.  The patient tolerated the procedure well with no immediate complications.  STUDY FINDINGS:  Hemodynamic findings:  Left ventricular pressure is 159/3 with a left ventricular end-diastolic pressure of 8 mmHg.  Aortic pressure is 156/62 with a mean pressure 96 mmHg.  Left ventricular angiography was not performed but her ejection fraction is known to be normal.  Coronary angiography: 1. Left main coronary artery:  The vessel is normal in size and free     of significant disease or calcifications. 2. Left circumflex artery:  The vessel is normal in size and     nondominant.  A stent is noted in the mid segment which is patent     with only minimal in-stent restenosis.  Distal to the stent there     is a 10% stenosis.  The rest of the artery is free of significant     disease.  OM-1 is a small branch with mild ostial disease.  Second     and third diagonals are normal in size and free of significant     disease. 3. Left anterior descending artery:  The vessel is overall moderately     calcified, especially in the proximal and mid segments.  There is a     40% diffuse lesion in the proximal LAD.  In the mid segment after     the first diagonal, there is diffuse 70-95% disease.  The rest of     the artery has mild distal atherosclerosis but no significant     obstructive disease.  First diagonal is medium in size and about 2     mm in diameter.  It has a 40% ostial lesion followed by a 95% mid     lesion, but it is a long segment.  Second and third diagonals are     very small in size. 4. Right coronary  artery:  The vessel is normal in size and dominant.     It has moderate calcifications throughout its course.  There is a     30% disease proximally.  The mid segment has a 40-50% disease.     Distally before the bifurcation, there is a 30% disease.  The PDA     is large in size with mild diffuse atherosclerosis.  There are 2     medium-sized posterolateral branches which are free of significant     disease.  STUDY CONCLUSION: 1. Significant 2-vessel coronary  artery disease with patent stent in     the left circumflex. 2. Successful angioplasty and drug-eluting stent placement to the mid     left anterior descending for 95% stenosis which resulted in 0%     residual stenosis.  A 2.5 x 28 mm Promus Element drug-eluting stent     was placed. 3. Angioplasty of the diagonal is not recommended due to diffuse     disease and small vessel diameter.  RECOMMENDATIONS:  I recommend aspirin daily indefinitely and Plavix 75 mg once daily for at least 12 months and if possible longer.  Aggressive treatment of risk factors and hyperlipidemia is recommended given the diffuse nature of her coronary artery disease.     Lorine Bears, MD     MA/MEDQ  D:  09/19/2010  T:  09/19/2010  Job:  [id number]  cc:   [name], MD, [facility]  Electronically Signed by Lorine Bears MD on 10/09/2010 11:27:24 AM

## 2010-10-12 NOTE — Discharge Summary (Signed)
NAMEMarland Kitchen  TARAN, HABLE NO.:  [id number]  MEDICAL RECORD NO.:  [id number]  LOCATION:  6524                         FACILITY:  [facility]  PHYSICIAN:  [name], MD, FACCDATE OF BIRTH:  Jun 01, 1933  DATE OF ADMISSION:  09/19/2010 DATE OF DISCHARGE:  09/21/2010                              DISCHARGE SUMMARY   PRIMARY CARDIOLOGIST:  [name], MD, [facility]  PRIMARY CARE PHYSICIAN:  [name], MD  DISCHARGE DIAGNOSIS:  Unstable angina.  SECONDARY DIAGNOSES: 1. Coronary artery disease status post successful percutaneous     coronary intervention and drug-eluting stent placement to the     proximal/mid left anterior descending coronary artery this     admission. 2. Hypertension. 3. Hyperlipidemia. 4. Diabetes mellitus. 5. Stage III chronic kidney disease. 6. History of right groin hematoma without complications this     admission.  ALLERGIES:  No known drug allergies.  PROCEDURES:  Left heart cardiac catheterization performed on September 19, 2010, revealing a 99% stenosis in the proximal to mid LAD with patent stent in the left circumflex and nonobstructive disease in the right coronary artery.  The patient also had a 95% stenosis in a small first diagonal branch.  The LAD was successfully stented with a 2.5 x 20 mm Promus Element Plus drug-eluting stent.  HISTORY OF PRESENT ILLNESS:  A 77 year old female with prior history of coronary disease status post prior left circumflex stenting in April 2011 in [place], [place], who was admitted to [name] on September 18, 2010, with complaints of recurrent chest discomfort.  The patient was evaluated by [name] at [name] and was determined to have ruled out for myocardial infarction.  It was felt that she would require catheterization and was transferred to [name] for further evaluation.  HOSPITAL COURSE:  The patient arrived to [name] on September 19, 2010, and was taken to the  catheterization lab where she underwent diagnostic catheterization, revealing a new 95% stenosis in the proximal-to-mid LAD with patent left circumflex stent and otherwise nonobstructive disease as outlined above.  Attention was turned to the mid LAD which was successfully stented with a placement of a 2.5 x 28 mm Promus Element Plus drug-eluting stent.  The patient tolerated the procedure well and postprocedure, has had no recurrence of chest discomfort.  She has been found to be significant hyperlipidemic with a total cholesterol of 286 and we have initiated high-dose statin therapy.  She was also seen by cardiac rehab and has been ambulating without recurrent symptoms or limitations.  Secondary to baseline bradycardia, we have not placed her on a beta-blocker.  She will be discharged home today in good condition.  DISCHARGE LABS:  Hemoglobin 13.4, hematocrit 39.1, WBC 9.1, platelets 182.  Sodium 138, potassium 3.6, chloride 100, CO2 28, BUN 19, creatinine 1.23, glucose 117, calcium 9.7.  CK 54, MB 5.7, troponin I 0.98.  Total cholesterol 286, triglycerides 506, HDL 61, LDL not calculated.  DISPOSITION:  The patient will be discharged home today in good condition.  FOLLOWUP PLANS AND APPOINTMENTS:  The patient is to follow up with Dr. [name] on October 05, 2010, at 1 p.m.  She will follow up with Dr. [name] as  previously scheduled.  DISCHARGE MEDICATIONS: 1. Aspirin 81 mg daily. 2. Plavix 75 mg daily. 3. Lipitor 80 mg at bedtime. 4. Dicyclomine 10 mg t.i.d. 5. Depakote ER 500 mg daily. 6. Protonix 40 mg daily. 7. Amlodipine 10 mg daily. 8. Glyburide 5 mg b.i.d. 9. Amitriptyline 10 mg at bedtime. 10.Lisinopril 20 mg daily. 11.HCTZ 25 mg daily. 12.Fish oil 1000 mg b.i.d. 13.Levemir 20-22 units at bedtime. 14.Norco 5/325, 1-2 tabs q.4-6 hours p.r.n. 15.Nitroglycerin 0.4 mg sublingual p.r.n. chest pain.  OUTSTANDING LAB STUDIES:  None.  DURATION OF DISCHARGE  ENCOUNTER:  40 minutes including physician time.     Sharon Hubbard, ANP   ______________________________ [name], MD, [facility]    CB/MEDQ  D:  09/21/2010  T:  09/21/2010  Job:  [id number]  cc:   [name], MD  Electronically Signed by Sharon Hubbard ANP on 10/04/2010 03:19:25 PM Electronically Signed by [name] MD FACC on 10/12/2010 09:00:41 AM

## 2010-10-19 NOTE — Telephone Encounter (Signed)
Pt seen in ER for evaluation and transferred [facility]. She has also been seen in office for f/u with Dr. Myrtis Ser.

## 2011-01-10 ENCOUNTER — Ambulatory Visit: Payer: Medicare Other | Admitting: Cardiology

## 2011-01-14 ENCOUNTER — Telehealth: Payer: Self-pay | Admitting: Cardiology

## 2011-01-14 NOTE — Telephone Encounter (Signed)
Called patient to reschedule an appointment 12/26 to 01/07; done.  Patient complains of sob for last 2-3 wks and feeling tired.  Patient states had blockages and this is same feeling she had before.

## 2011-01-16 ENCOUNTER — Ambulatory Visit: Payer: Medicare Other | Admitting: Cardiology

## 2011-01-17 ENCOUNTER — Telehealth: Payer: Self-pay | Admitting: *Deleted

## 2011-01-17 NOTE — Telephone Encounter (Signed)
Pt walked into the office stating she is having SOB, especially with exertion, fatigue, and chest pain/pressure that comes and goes. This feels similar to when she had her 95% blockage but not like her 99.9% blockage. Pt states she knows something is wrong. Pt notified to go to ER for evaluation of symptoms. Pt verbalized understanding.

## 2011-01-18 ENCOUNTER — Encounter (HOSPITAL_COMMUNITY)
Admission: RE | Disposition: A | Discharge status: Home / Self Care | Payer: Self-pay | Source: Ambulatory Visit | Attending: Cardiovascular Disease

## 2011-01-18 ENCOUNTER — Observation Stay (HOSPITAL_COMMUNITY)
Admission: RE | Admit: 2011-01-18 | Discharge: 2011-01-20 | Disposition: A | Discharge status: Home / Self Care | Payer: Medicare Other | Source: Ambulatory Visit | Attending: Cardiovascular Disease | Admitting: Cardiovascular Disease

## 2011-01-18 ENCOUNTER — Encounter (HOSPITAL_COMMUNITY): Payer: Self-pay | Admitting: General Practice

## 2011-01-18 ENCOUNTER — Inpatient Hospital Stay (HOSPITAL_COMMUNITY)
Admission: AD | Admit: 2011-01-18 | Payer: Self-pay | Source: Other Acute Inpatient Hospital | Admitting: Cardiovascular Disease

## 2011-01-18 DIAGNOSIS — I639 Cerebral infarction, unspecified: Secondary | ICD-10-CM

## 2011-01-18 DIAGNOSIS — I2510 Atherosclerotic heart disease of native coronary artery without angina pectoris: Secondary | ICD-10-CM

## 2011-01-18 DIAGNOSIS — R079 Chest pain, unspecified: Secondary | ICD-10-CM

## 2011-01-18 HISTORY — DX: Encounter for other specified aftercare: Z51.89

## 2011-01-18 HISTORY — DX: Cerebral infarction, unspecified: I63.9

## 2011-01-18 HISTORY — DX: Bradycardia, unspecified: R00.1

## 2011-01-18 HISTORY — PX: CARDIAC CATHETERIZATION: SHX172

## 2011-01-18 HISTORY — DX: Acute myocardial infarction, unspecified: I21.9

## 2011-01-18 HISTORY — DX: Adverse effect of unspecified anesthetic, initial encounter: T41.45XA

## 2011-01-18 HISTORY — DX: Malignant (primary) neoplasm, unspecified: C80.1

## 2011-01-18 HISTORY — DX: Shortness of breath: R06.02

## 2011-01-18 HISTORY — DX: Other complications of anesthesia, initial encounter: T88.59XA

## 2011-01-18 HISTORY — PX: CORONARY ANGIOGRAM: SHX5466

## 2011-01-18 HISTORY — DX: Reserved for inherently not codable concepts without codable children: IMO0001

## 2011-01-18 HISTORY — DX: Anemia, unspecified: D64.9

## 2011-01-18 LAB — DIFFERENTIAL
Basophils Absolute: 0 10*3/uL (ref 0.0–0.1)
Basophils Relative: 0 % (ref 0–1)
Monocytes Absolute: 0.5 10*3/uL (ref 0.1–1.0)
Neutro Abs: 2.8 10*3/uL (ref 1.7–7.7)

## 2011-01-18 LAB — CBC
HCT: 34.7 % — ABNORMAL LOW (ref 36.0–46.0)
MCHC: 33.7 g/dL (ref 30.0–36.0)
Platelets: 129 10*3/uL — ABNORMAL LOW (ref 150–400)
RDW: 15 % (ref 11.5–15.5)

## 2011-01-18 LAB — HEMOGLOBIN A1C
Hgb A1c MFr Bld: 9.1 % — ABNORMAL HIGH (ref <5.7)
Mean Plasma Glucose: 214 mg/dL — ABNORMAL HIGH (ref <117)

## 2011-01-18 LAB — GLUCOSE, CAPILLARY: Glucose-Capillary: 198 mg/dL — ABNORMAL HIGH (ref 70–99)

## 2011-01-18 LAB — BASIC METABOLIC PANEL
BUN: 16 mg/dL (ref 6–23)
Creatinine, Ser: 1.19 mg/dL — ABNORMAL HIGH (ref 0.50–1.10)
GFR calc Af Amer: 50 mL/min — ABNORMAL LOW (ref >90)
GFR calc non Af Amer: 44 mL/min — ABNORMAL LOW (ref >90)
Potassium: 3.7 mEq/L (ref 3.5–5.1)

## 2011-01-18 SURGERY — CORONARY ANGIOGRAM

## 2011-01-18 MED ORDER — ACETAMINOPHEN 325 MG PO TABS: 650.0000 mg | ORAL_TABLET | ORAL | Status: DC | PRN

## 2011-01-18 MED ORDER — AMLODIPINE BESYLATE 10 MG PO TABS
10.0000 mg | ORAL_TABLET | Freq: Every day | ORAL | Status: DC
  Administered 2011-01-18 – 2011-01-20 (×3): 10 mg via ORAL
  Filled 2011-01-18 (×3): qty 1

## 2011-01-18 MED ORDER — OMEGA-3-ACID ETHYL ESTERS 1 G PO CAPS
1.0000 g | ORAL_CAPSULE | Freq: Two times a day (BID) | ORAL | Status: DC
  Administered 2011-01-18: 1 g via ORAL
  Filled 2011-01-18 (×3): qty 1

## 2011-01-18 MED ORDER — HEPARIN SODIUM (PORCINE) 5000 UNIT/ML IJ SOLN
5000.0000 [IU] | Freq: Three times a day (TID) | INTRAMUSCULAR | Status: DC
  Administered 2011-01-19 – 2011-01-20 (×5): 5000 [IU] via SUBCUTANEOUS
  Filled 2011-01-18 (×7): qty 1

## 2011-01-18 MED ORDER — NITROGLYCERIN 0.2 MG/ML ON CALL CATH LAB
INTRAVENOUS | Status: AC
  Filled 2011-01-18: qty 1

## 2011-01-18 MED ORDER — SODIUM CHLORIDE 0.9 % IV SOLN
INTRAVENOUS | Status: DC
  Administered 2011-01-18 (×3): via INTRAVENOUS

## 2011-01-18 MED ORDER — FENTANYL CITRATE 0.05 MG/ML IJ SOLN
INTRAMUSCULAR | Status: AC
  Filled 2011-01-18: qty 2

## 2011-01-18 MED ORDER — LIDOCAINE HCL (PF) 1 % IJ SOLN
INTRAMUSCULAR | Status: AC
  Filled 2011-01-18: qty 30

## 2011-01-18 MED ORDER — LISINOPRIL 20 MG PO TABS
20.0000 mg | ORAL_TABLET | Freq: Every day | ORAL | Status: DC
  Administered 2011-01-19 – 2011-01-20 (×2): 20 mg via ORAL
  Filled 2011-01-18 (×2): qty 1

## 2011-01-18 MED ORDER — HYDROCODONE-ACETAMINOPHEN 5-325 MG PO TABS
1.0000 | ORAL_TABLET | Freq: Four times a day (QID) | ORAL | Status: DC | PRN
  Administered 2011-01-18 – 2011-01-20 (×4): 1 via ORAL
  Filled 2011-01-18 (×4): qty 1

## 2011-01-18 MED ORDER — ISOSORBIDE MONONITRATE ER 30 MG PO TB24
30.0000 mg | ORAL_TABLET | ORAL | Status: DC
  Administered 2011-01-18 – 2011-01-19 (×2): 30 mg via ORAL
  Filled 2011-01-18 (×3): qty 1

## 2011-01-18 MED ORDER — SIMVASTATIN 20 MG PO TABS
20.0000 mg | ORAL_TABLET | Freq: Every day | ORAL | Status: DC
  Administered 2011-01-18 – 2011-01-19 (×2): 20 mg via ORAL
  Filled 2011-01-18 (×3): qty 1

## 2011-01-18 MED ORDER — NITROGLYCERIN 0.4 MG SL SUBL: 0.4000 mg | SUBLINGUAL_TABLET | SUBLINGUAL | Status: DC | PRN

## 2011-01-18 MED ORDER — CLOPIDOGREL BISULFATE 75 MG PO TABS: 75.0000 mg | ORAL_TABLET | Freq: Every day | ORAL | Status: DC

## 2011-01-18 MED ORDER — PREDNISONE 1 MG PO TABS
1.0000 mg | ORAL_TABLET | Freq: Three times a day (TID) | ORAL | Status: DC
  Administered 2011-01-18: 1 mg via ORAL
  Filled 2011-01-18 (×4): qty 1

## 2011-01-18 MED ORDER — MIDAZOLAM HCL 2 MG/2ML IJ SOLN
INTRAMUSCULAR | Status: AC
  Filled 2011-01-18: qty 2

## 2011-01-18 MED ORDER — MAGNESIUM HYDROXIDE 400 MG/5ML PO SUSP
30.0000 mL | Freq: Every day | ORAL | Status: DC | PRN
  Administered 2011-01-18: 30 mL via ORAL
  Filled 2011-01-18: qty 30

## 2011-01-18 MED ORDER — ASPIRIN 81 MG PO CHEW
81.0000 mg | CHEWABLE_TABLET | Freq: Every day | ORAL | Status: DC
  Administered 2011-01-18 – 2011-01-20 (×3): 81 mg via ORAL
  Filled 2011-01-18 (×4): qty 1

## 2011-01-18 MED ORDER — ALUM & MAG HYDROXIDE-SIMETH 200-200-20 MG/5ML PO SUSP
15.0000 mL | ORAL | Status: DC | PRN
  Administered 2011-01-18: 30 mL via ORAL
  Filled 2011-01-18: qty 30

## 2011-01-18 MED ORDER — GLIMEPIRIDE 4 MG PO TABS
4.0000 mg | ORAL_TABLET | Freq: Two times a day (BID) | ORAL | Status: DC
  Administered 2011-01-18 – 2011-01-20 (×3): 4 mg via ORAL
  Filled 2011-01-18 (×7): qty 1

## 2011-01-18 MED ORDER — INSULIN ASPART 100 UNIT/ML ~~LOC~~ SOLN: 0.0000 [IU] | Freq: Every day | SUBCUTANEOUS | Status: DC

## 2011-01-18 MED ORDER — INSULIN ASPART 100 UNIT/ML ~~LOC~~ SOLN
0.0000 [IU] | Freq: Three times a day (TID) | SUBCUTANEOUS | Status: DC
  Administered 2011-01-19 (×3): 3 [IU] via SUBCUTANEOUS
  Administered 2011-01-20: 5 [IU] via SUBCUTANEOUS
  Filled 2011-01-18: qty 3

## 2011-01-18 MED ORDER — TIMOLOL MALEATE 0.5 % OP SOLN
1.0000 [drp] | OPHTHALMIC | Status: DC
  Administered 2011-01-18 – 2011-01-19 (×2): 1 [drp] via OPHTHALMIC
  Filled 2011-01-18: qty 5

## 2011-01-18 MED ORDER — OMEGA-3 FATTY ACIDS 1000 MG PO CAPS: 1.0000 g | ORAL_CAPSULE | Freq: Two times a day (BID) | ORAL | Status: DC

## 2011-01-18 MED ORDER — MAGNESIUM HYDROXIDE 400 MG/5ML PO SUSP: 30.0000 mL | Freq: Every day | ORAL | Status: DC | PRN

## 2011-01-18 MED ORDER — DIVALPROEX SODIUM ER 500 MG PO TB24
500.0000 mg | ORAL_TABLET | ORAL | Status: DC
  Administered 2011-01-18 – 2011-01-19 (×2): 500 mg via ORAL
  Filled 2011-01-18 (×4): qty 1

## 2011-01-18 MED ORDER — ONDANSETRON HCL 4 MG/2ML IJ SOLN: 4.0000 mg | Freq: Four times a day (QID) | INTRAMUSCULAR | Status: DC | PRN

## 2011-01-18 MED ORDER — METOPROLOL TARTRATE 25 MG PO TABS
25.0000 mg | ORAL_TABLET | Freq: Two times a day (BID) | ORAL | Status: DC
  Administered 2011-01-18 – 2011-01-20 (×4): 25 mg via ORAL
  Filled 2011-01-18 (×7): qty 1

## 2011-01-18 MED ORDER — CLOPIDOGREL BISULFATE 75 MG PO TABS
75.0000 mg | ORAL_TABLET | Freq: Every day | ORAL | Status: DC
  Administered 2011-01-19 – 2011-01-20 (×2): 75 mg via ORAL
  Filled 2011-01-18 (×2): qty 1

## 2011-01-18 MED ORDER — LATANOPROST 0.005 % OP SOLN
1.0000 [drp] | Freq: Every day | OPHTHALMIC | Status: DC
  Administered 2011-01-18 – 2011-01-19 (×2): 1 [drp] via OPHTHALMIC
  Filled 2011-01-18 (×2): qty 2.5

## 2011-01-18 MED ORDER — HEPARIN SODIUM (PORCINE) 5000 UNIT/ML IJ SOLN: 5000.0000 [IU] | Freq: Three times a day (TID) | INTRAMUSCULAR | Status: DC

## 2011-01-18 MED ORDER — AMITRIPTYLINE HCL 10 MG PO TABS
10.0000 mg | ORAL_TABLET | Freq: Every day | ORAL | Status: DC
  Administered 2011-01-18 – 2011-01-19 (×2): 10 mg via ORAL
  Filled 2011-01-18 (×3): qty 1

## 2011-01-18 MED ORDER — PANTOPRAZOLE SODIUM 40 MG PO TBEC
40.0000 mg | DELAYED_RELEASE_TABLET | Freq: Every day | ORAL | Status: DC
  Administered 2011-01-19 – 2011-01-20 (×2): 40 mg via ORAL
  Filled 2011-01-18 (×2): qty 1

## 2011-01-18 MED ORDER — ASPIRIN EC 81 MG PO TBEC: 81.0000 mg | DELAYED_RELEASE_TABLET | Freq: Every day | ORAL | Status: DC

## 2011-01-18 MED ORDER — HEPARIN (PORCINE) IN NACL 2-0.9 UNIT/ML-% IJ SOLN
INTRAMUSCULAR | Status: AC
  Filled 2011-01-18: qty 2000

## 2011-01-18 NOTE — Op Note (Signed)
   Cardiac Catheterization Procedure Note  Name: Sharon Hubbard MRN: [id number] DOB: Apr 01, 1933  Procedure: Selective Coronary Angiography  Indication: Symptoms consistent with unstable angina   Procedural details: The right groin was prepped, draped, and anesthetized with 1% lidocaine. Using modified Seldinger technique, a 5 French sheath was introduced into the right femoral artery. MP catheter was used to engage the left coronary.  JR4 catheter was used to engage the right coronary.  Left ventriculography was not done due to chronic kidney disease. Catheter exchanges were performed over a guidewire. There were no immediate procedural complications. The patient was transferred to the post catheterization recovery area for further monitoring.  Procedural Findings:   Coronary angiography:  Coronary dominance: right  Left mainstem: No angiographic disease.  Left anterior descending (LAD): Patent long proximal LAD stent after D1 with minimal in-stent restenosis.  Small to moderate D1 with 90% ostial stenosis and 95% proximal stenosis.  This is unchanged when compared to the prior cath film.  30-40% mid LAD stenosis.   Left circumflex (LCx): Small OM1.  Moderate OM2 with 60% proximal vessel stenosis.  Large PLOM with minimal disease.  There is a patent stent in the proximal LCx with minimal in-stent restenosis.   Right coronary artery (RCA): 30% proximal, 30% mid, and 40% distal RCA stenosis.   Left ventriculography: Not done due to CKD.   Final Conclusions:  Patent LAD and LCx stents.  The only significantly obstructive disease is in a small to moderate D1 with 90% ostial and 95% proximal stenosis.  This is unchanged from the prior cath.  This could be causing anginal symptoms.  Given the small size of the vessel and its stability, would treat medically.  I will add metoprolol and Imdur to her regimen.  LV-gram was not done due to CKD, so will get an echocardiogram.   Marca Ancona 01/18/2011, 4:15 PM

## 2011-01-18 NOTE — H&P (Signed)
   Dr. Jari Sportsman consult from today at [facility] was reviewed. Agree with his history and physical, no changes.  Will proceed with left heart cath.   Marca Ancona 3:32 PM 01/18/2011

## 2011-01-19 DIAGNOSIS — I2510 Atherosclerotic heart disease of native coronary artery without angina pectoris: Secondary | ICD-10-CM

## 2011-01-19 DIAGNOSIS — I359 Nonrheumatic aortic valve disorder, unspecified: Secondary | ICD-10-CM

## 2011-01-19 LAB — CBC
Hemoglobin: 11.1 g/dL — ABNORMAL LOW (ref 12.0–15.0)
MCHC: 33 g/dL (ref 30.0–36.0)
RDW: 15 % (ref 11.5–15.5)

## 2011-01-19 LAB — BASIC METABOLIC PANEL
GFR calc Af Amer: 50 mL/min — ABNORMAL LOW (ref >90)
GFR calc non Af Amer: 43 mL/min — ABNORMAL LOW (ref >90)
Potassium: 4.2 mEq/L (ref 3.5–5.1)
Sodium: 142 mEq/L (ref 135–145)

## 2011-01-19 LAB — LIPID PANEL
LDL Cholesterol: UNDETERMINED mg/dL (ref 0–99)
Triglycerides: 461 mg/dL — ABNORMAL HIGH (ref <150)
VLDL: UNDETERMINED mg/dL (ref 0–40)

## 2011-01-19 LAB — GLUCOSE, CAPILLARY: Glucose-Capillary: 185 mg/dL — ABNORMAL HIGH (ref 70–99)

## 2011-01-19 MED ORDER — POTASSIUM CHLORIDE CRYS ER 10 MEQ PO TBCR
10.0000 meq | EXTENDED_RELEASE_TABLET | Freq: Every day | ORAL | Status: DC
  Administered 2011-01-19: 10 meq via ORAL
  Filled 2011-01-19 (×2): qty 1

## 2011-01-19 MED ORDER — OMEGA-3-ACID ETHYL ESTERS 1 G PO CAPS
2.0000 g | ORAL_CAPSULE | Freq: Two times a day (BID) | ORAL | Status: DC
  Filled 2011-01-19 (×2): qty 2

## 2011-01-19 MED ORDER — OMEGA-3-ACID ETHYL ESTERS 1 G PO CAPS
1.0000 g | ORAL_CAPSULE | Freq: Two times a day (BID) | ORAL | Status: DC
  Administered 2011-01-19 – 2011-01-20 (×3): 1 g via ORAL
  Filled 2011-01-19 (×4): qty 1

## 2011-01-19 MED ORDER — PREDNISONE 1 MG PO TABS
3.0000 mg | ORAL_TABLET | Freq: Every day | ORAL | Status: DC
  Administered 2011-01-20: 3 mg via ORAL
  Filled 2011-01-19 (×3): qty 3

## 2011-01-19 MED ORDER — FUROSEMIDE 20 MG PO TABS
20.0000 mg | ORAL_TABLET | Freq: Every day | ORAL | Status: DC
  Administered 2011-01-19 – 2011-01-20 (×2): 20 mg via ORAL
  Filled 2011-01-19 (×2): qty 1

## 2011-01-19 NOTE — Discharge Summary (Signed)
Discharge Summary   Patient ID: Sharon Hubbard MRN: [id number], DOB/AGE: 1933-09-28 77 y.o. Admit date: 01/18/2011 D/C date:     01/20/2011    Primary Cardiologist:  Dr. Zackery Barefoot in [place] Primary Electrophysiologist:  None   Reason for Admission:  Unstable Angina  Primary Discharge Diagnoses:  1. Exertional chest pain and dyspnea-question anginal equivalent 2. CAD-LAD and circumflex stents patent by cardiac catheterization this admission, 95% stenosis in small to moderate D1 (not good for PCI) - medical therapy for CAD. 3. High Triglycerides - Lovaza added, but patient already on Fish Oil at home and this is continued at discharge  Secondary Discharge Diagnoses:   Past Medical History  Diagnosis Date  . CAD (coronary artery disease)     DES  ostial circumflex., April, 2011, Dr.Zachary, [place], residual 40% LAD and 50% diagonal  /  nuclear. April, 2012, no scar or ischemia  . Chest pain     [facility], April, 2012, atypical pain, nuclear no scar or ischemia  . Groin hematoma     April, 2011  . Hypertension   . Diabetes mellitus   . Dyslipidemia   . GERD (gastroesophageal reflux disease)   . Schatzki's ring   . Renal insufficiency   . Polymyalgia rheumatica   . Osteoarthritis     arthroscopic surgery right knee February, 2012  . Spinal stenosis     history of surgery for this  . Ejection fraction     EF 65%, echo, April, 2012, aortic valve sclerosis with very slight gradient  . Preoperative evaluation to rule out surgical contraindication     Patient needs back surgery by Dr. Channing Mutters, May, 2012                . Pancreas cyst     The patient has multiple pancreatic cysts.  This is followed at [facility]         . Complication of anesthesia     "felt like I was smothering once with mask on my face"  . RBBB (right bundle branch block)     old  . Bradycardia, sinus   . Angina   . Myocardial infarction 2011; 08/2010  . Shortness of breath on exertion   . Blood  transfusion   . Anemia   . Stroke 01/18/11    "I've had 2 TIA's"  . Cancer     ""had hysterectomy for a 6 showing cancer; think that's pretty strong  1.    Procedures Performed This Admission:   Cardiac catheterization 01/10/11: Left mainstem: No angiographic disease.  Left anterior descending (LAD): Patent long proximal LAD stent after D1 with minimal in-stent restenosis. Small to moderate D1 with 90% ostial stenosis and 95% proximal stenosis. This is unchanged when compared to the prior cath film. 30-40% mid LAD stenosis.  Left circumflex (LCx): Small OM1. Moderate OM2 with 60% proximal vessel stenosis. Large PLOM with minimal disease. There is a patent stent in the proximal LCx with minimal in-stent restenosis.  Right coronary artery (RCA): 30% proximal, 30% mid, and 40% distal RCA stenosis.  Left ventriculography: Not done due to CKD.  Final Conclusions: Patent LAD and LCx stents. The only significantly obstructive disease is in a small to moderate D1 with 90% ostial and 95% proximal stenosis. This is unchanged from the prior cath. This could be causing anginal symptoms. Given the small size of the vessel and its stability, would treat medically. I will add metoprolol and Imdur to her regimen. LV-gram was  not done due to CKD, so will get an echocardiogram.   2D Echocardiogram 01/19/11: Study Conclusions  - Left ventricle: The cavity size was mildly reduced. There was mild concentric hypertrophy. Systolic function was vigorous. The estimated ejection fraction was in the range of 65% to 70%. Wall motion was normal; there were no regional wall motion abnormalities. - Aortic valve: Mildly calcified annulus. Mildly thickened, moderately calcified leaflets. Cusp separation was mildly reduced. There was very mild stenosis. Valve area: 1.49cm^2(VTI). Valve area: 1.62cm^2 (Vmax). - Right ventricle: The cavity size was normal. Wall thickness was mildly increased. - Atrial septum: No defect  or patent foramen ovale was identified.     Hospital Course:  Sharon Hubbard is a 77 y.o. female who  has a history of PCI to the circumflex and LAD. She was admitted to [facility] and seen in consultation by Dr. Lorine Bears 12/28. She presented with exertional chest tightness and dyspnea similar to her prior angina. ECG was unremarkable. Cardiac enzymes were negative. She was transferred for cardiac catheterization. As noted above, she had a small to moderate D1 with 90% ostial and 95% proximal stenosis. This is not suitable for PCI. Her stents were patent. Medical therapy was pursued. She was evaluated by Dr. Marca Ancona this morning. Echocardiogram is currently pending at the time of this dictation. She will be ambulated on medical therapy which includes beta blocker, long-acting nitrate and diuretic.  She was to be discharged on 12/29. However, she was uncomfortable with this. She was kept one more night and ambulated. She did have some chest symptoms and shortness of breath on the day of discharge. She was evaluated by Dr. Marca Ancona.  He felt she was stable for discharge to home. Her isosorbide was adjusted from 30 mg to 60 mg daily at discharge. However, is felt that her chest discomfort and shortness of breath were not likely anginal. Chest x-ray was clear and [facility]. Oxygen saturations have been normal. She has had no fever. She will be discharged home in stable condition. She will need close followup next week at [facility].   Discharge Vitals: Blood pressure 136/69, pulse 63, temperature 98.1 F (36.7 C), temperature source Oral, resp. rate 16, height 5\' 3"  (1.6 m), weight 164 lb 0.4 oz (74.4 kg), SpO2 97.00%.  Labs: Lab Results  Component Value Date   WBC 5.3 01/19/2011   HGB 11.1* 01/19/2011   HCT 33.6* 01/19/2011   MCV 88.0 01/19/2011   PLT 130* 01/19/2011     Lab 01/20/11 0616  NA 137  K 3.4*  CL 102  CO2 27  BUN 20  CREATININE 1.31*  CALCIUM 9.4   PROT --  BILITOT --  ALKPHOS --  ALT --  AST --  GLUCOSE 183*   No results found for this basename: CKTOTAL:4,CKMB:4,TROPONINI:4 in the last 72 hours  Lab Results  Component Value Date   CHOL 302* 01/19/2011   HDL 45 01/19/2011   LDLCALC UNABLE TO CALCULATE IF TRIGLYCERIDE OVER 400 mg/dL 16/10/9602   TRIG 540* 01/19/2011   No results found for this basename: DDIMER   Lab Results  Component Value Date   TSH 3.885 01/18/2011    Diagnostic Studies/Procedures:  No results found.  Discharge Medications   Current Discharge Medication List    START taking these medications   Details  isosorbide mononitrate (IMDUR) 60 MG 24 hr tablet Take 1 tablet (60 mg total) by mouth daily. Qty: 30 tablet, Refills: 5    metoprolol  tartrate (LOPRESSOR) 25 MG tablet Take 1 tablet (25 mg total) by mouth 2 (two) times daily. Qty: 60 tablet, Refills: 5    nitroGLYCERIN (NITROSTAT) 0.4 MG SL tablet Place 1 tablet (0.4 mg total) under the tongue every 5 (five) minutes as needed for chest pain. Qty: 25 tablet, Refills: 11    potassium chloride SA (K-DUR,KLOR-CON) 20 MEQ tablet Take 1 tablet (20 mEq total) by mouth daily. Qty: 30 tablet, Refills: 5      CONTINUE these medications which have NOT CHANGED   Details  amLODipine (NORVASC) 10 MG tablet Take 10 mg by mouth daily.      aspirin 81 MG tablet Take 81 mg by mouth daily.      atorvastatin (LIPITOR) 80 MG tablet Take 80 mg by mouth at bedtime.      clopidogrel (PLAVIX) 75 MG tablet Take 75 mg by mouth daily.      divalproex (DEPAKOTE) 500 MG 24 hr tablet Take 500 mg by mouth daily.      fish oil-omega-3 fatty acids 1000 MG capsule Take 1 g by mouth 2 (two) times daily.      furosemide (LASIX) 20 MG tablet Take 20 mg by mouth daily.      glimepiride (AMARYL) 4 MG tablet Take 4 mg by mouth 2 (two) times daily.      HYDROcodone-acetaminophen (VICODIN) 5-500 MG per tablet Take 1 tablet by mouth every 8 (eight) hours as needed. For  pain     insulin detemir (LEVEMIR) 100 UNIT/ML injection Inject 20 Units into the skin at bedtime.      insulin lispro (HUMALOG) 100 UNIT/ML injection Inject 4-8 Units into the skin 3 (three) times daily before meals. Pt is on a sliding scale     latanoprost (XALATAN) 0.005 % ophthalmic solution Place 1 drop into both eyes at bedtime.      lisinopril (PRINIVIL,ZESTRIL) 20 MG tablet Take 20 mg by mouth daily.      pantoprazole (PROTONIX) 40 MG tablet Take 40 mg by mouth daily.      predniSONE (DELTASONE) 1 MG tablet Take 3 mg by mouth every morning.      timolol (TIMOPTIC) 0.5 % ophthalmic solution Place 1 drop into both eyes daily.       STOP taking these medications     insulin glargine (LANTUS) 100 UNIT/ML injection      amitriptyline (ELAVIL) 10 MG tablet      simvastatin (ZOCOR) 20 MG tablet         Disposition   The patient will be discharged in stable condition to home. Discharge Orders    Future Appointments: Provider: Department: Dept Phone: Center:   01/28/2011 10:00 AM [name], MD [facility] [phone] [facility]     Future Orders Please Complete By Expires   Diet - low sodium heart healthy      Diet Carb Modified      Increase activity slowly      Driving Restrictions      Comments:   No driving for 3 days.   Lifting restrictions      Comments:   No lifting for one week.   Sexual Activity Restrictions      Comments:   No sex for one week.     Follow-up Information    Follow up with [facility] in 1 week. (keep follow up with Dr. Myrtis Ser 01/28/11 as scheduled.)    Contact information:   [address]  [place] [address]            Duration of Discharge Encounter: Greater than 30 minutes including physician and PA time.  Signed, Tereso Newcomer, PA-C  2:48 PM 01/20/2011        [address] [place], [place]  [address] Phone: [phone] Fax:  [phone]

## 2011-01-19 NOTE — Progress Notes (Signed)
  Echocardiogram 2D Echocardiogram has been performed.  Dayna Geurts, Real Cons 01/19/2011, 10:17 AM

## 2011-01-19 NOTE — Progress Notes (Addendum)
Patient ID: Sharon Hubbard, female   DOB: Jun 21, 1933, 77 y.o.   MRN: [id number]    SUBJECTIVE: Feels fine this morning.  Not short of breath but has not been out of bed yet.      Marland Kitchen amitriptyline  10 mg Oral QHS  . amLODipine  10 mg Oral Daily  . aspirin  81 mg Oral Daily  . clopidogrel  75 mg Oral Q breakfast  . divalproex  500 mg Oral Q24H  . fentaNYL      . glimepiride  4 mg Oral BID  . heparin      . heparin  5,000 Units Subcutaneous Q8H  . insulin aspart  0-15 Units Subcutaneous TID WC  . insulin aspart  0-5 Units Subcutaneous QHS  . isosorbide mononitrate  30 mg Oral Q24H  . latanoprost  1 drop Both Eyes QHS  . lidocaine      . lisinopril  20 mg Oral Daily  . metoprolol tartrate  25 mg Oral BID  . midazolam      . nitroGLYCERIN      . omega-3 acid ethyl esters  1 g Oral BID  . pantoprazole  40 mg Oral Q1200  . predniSONE  1 mg Oral TID  . simvastatin  20 mg Oral QHS  . timolol  1 drop Both Eyes Q24H  . DISCONTD: aspirin EC  81 mg Oral Daily  . DISCONTD: clopidogrel  75 mg Oral Daily  . DISCONTD: fish oil-omega-3 fatty acids  1 g Oral BID  . DISCONTD: heparin  5,000 Units Subcutaneous Q8H      Filed Vitals:   01/19/11 0033 01/19/11 0152 01/19/11 0627 01/19/11 0804  BP: 156/70  138/66 154/63  Pulse: 65  60 64  Temp: 98.5 F (36.9 C)  97.8 F (36.6 C) 97.6 F (36.4 C)  TempSrc: Oral  Oral Oral  Resp: 19  13 18   Height:  5\' 3"  (1.6 m)    Weight:  74.4 kg (164 lb 0.4 oz)    SpO2: 96%  94% 98%    Intake/Output Summary (Last 24 hours) at 01/19/11 0920 Last data filed at 01/19/11 7829  Gross per 24 hour  Intake      0 ml  Output   1100 ml  Net  -1100 ml    LABS: Basic Metabolic Panel:  Basename 01/19/11 0600 01/18/11 1809  NA 142 139  K 4.2 3.7  CL 103 101  CO2 29 28  GLUCOSE 196* 138*  BUN 14 16  CREATININE 1.20* 1.19*  CALCIUM 9.3 9.6  MG -- --  PHOS -- --   Liver Function Tests: No results found for this basename:  AST:2,ALT:2,ALKPHOS:2,BILITOT:2,PROT:2,ALBUMIN:2 in the last 72 hours No results found for this basename: LIPASE:2,AMYLASE:2 in the last 72 hours CBC:  Basename 01/19/11 0600 01/18/11 1809  WBC 5.3 6.1  NEUTROABS -- 2.8  HGB 11.1* 11.7*  HCT 33.6* 34.7*  MCV 88.0 87.2  PLT 130* 129*   Cardiac Enzymes: No results found for this basename: CKTOTAL:3,CKMB:3,CKMBINDEX:3,TROPONINI:3 in the last 72 hours BNP: No components found with this basename: POCBNP:3 D-Dimer: No results found for this basename: DDIMER:2 in the last 72 hours Hemoglobin A1C:  Basename 01/18/11 1809  HGBA1C 9.1*   Fasting Lipid Panel:  Basename 01/19/11 0600  CHOL 302*  HDL 45  LDLCALC UNABLE TO CALCULATE IF TRIGLYCERIDE OVER 400 mg/dL  TRIG 562*  CHOLHDL 6.7  LDLDIRECT --   Thyroid Function Tests:  Basename 01/18/11 1809  TSH  3.885  T4TOTAL --  T3FREE --  THYROIDAB --   Anemia Panel: No results found for this basename: VITAMINB12,FOLATE,FERRITIN,TIBC,IRON,RETICCTPCT in the last 72 hours  RADIOLOGY: No results found.  PHYSICAL EXAM General: NAD Neck: No JVD, no thyromegaly or thyroid nodule.  Lungs: Clear to auscultation bilaterally with normal respiratory effort. CV: Nondisplaced PMI.  Heart regular S1/S2, no S3/S4, no murmur.  No peripheral edema.  Right groin cath site benign.  Abdomen: Soft, nontender, no hepatosplenomegaly, no distention.  Neurologic: Alert and oriented x 3.  Psych: Normal affect. Extremities: No clubbing or cyanosis.   ASSESSMENT AND PLAN:  77 yo with known CAD s/p LAD and CFX PCIs went to [facility] because of profound exertional dyspnea. Left heart cath showed patent stents, 95% stenosis in small to moderate D1 which is unchanged from prior cath.  1. Exertional dyspnea: No new CAD.  She certainly could be having anginal symptoms from the D1 stenosis but unsure why this would be acutely worse as no change from the cath in the summer.  She does not appear  significantly volume overloaded on exam.  Lungs were clear on CXR at [facility].  Oxygen saturation is normal, no fever.   - Added Imdur and metoprolol to regimen => if exertional dyspnea is anginal equivalent, this should help.  - Will add low dose Lasix 20 mg daily. 2. CAD: Stable.  D1 is small, not good PCI target.  Continue medical management.  Added metoprolol, Imdur.  3. Very high triglycerides: add Lovaza.   Marca Ancona 01/19/2011 9:27 AM  ADDENDUM: Sherron Monday with nurse on floor after transfer. Patient and family uncomfortable going home today. Will keep in house today. Ambulate with cardiac rehab. Hopefully home tomorrow. Tereso Newcomer, PA-C  4:54 PM 01/19/2011

## 2011-01-20 LAB — BASIC METABOLIC PANEL
BUN: 20 mg/dL (ref 6–23)
Chloride: 102 mEq/L (ref 96–112)
Creatinine, Ser: 1.31 mg/dL — ABNORMAL HIGH (ref 0.50–1.10)
GFR calc non Af Amer: 39 mL/min — ABNORMAL LOW (ref >90)
Glucose, Bld: 183 mg/dL — ABNORMAL HIGH (ref 70–99)
Potassium: 3.4 mEq/L — ABNORMAL LOW (ref 3.5–5.1)

## 2011-01-20 LAB — GLUCOSE, CAPILLARY
Glucose-Capillary: 201 mg/dL — ABNORMAL HIGH (ref 70–99)
Glucose-Capillary: 140 mg/dL — ABNORMAL HIGH (ref 70–99)

## 2011-01-20 MED ORDER — POTASSIUM CHLORIDE 20 MEQ/15ML (10%) PO LIQD
40.0000 meq | Freq: Once | ORAL | Status: AC
  Administered 2011-01-20: 40 meq via ORAL
  Filled 2011-01-20: qty 30

## 2011-01-20 MED ORDER — NITROGLYCERIN 0.4 MG SL SUBL: 0.4000 mg | SUBLINGUAL_TABLET | SUBLINGUAL | Status: DC | PRN

## 2011-01-20 MED ORDER — METOPROLOL TARTRATE 25 MG PO TABS: 25.0000 mg | ORAL_TABLET | Freq: Two times a day (BID) | ORAL | Status: DC

## 2011-01-20 MED ORDER — POTASSIUM CHLORIDE CRYS ER 20 MEQ PO TBCR: 20.0000 meq | EXTENDED_RELEASE_TABLET | Freq: Every day | ORAL | Status: DC

## 2011-01-20 MED ORDER — ISOSORBIDE MONONITRATE ER 60 MG PO TB24
60.0000 mg | ORAL_TABLET | ORAL | Status: DC
  Filled 2011-01-20: qty 1

## 2011-01-20 MED ORDER — ISOSORBIDE MONONITRATE ER 60 MG PO TB24: 60.0000 mg | ORAL_TABLET | ORAL | Status: DC

## 2011-01-20 NOTE — Progress Notes (Signed)
Patient ID: Sharon Hubbard, female   DOB: 07/18/1933, 77 y.o.   MRN: [id number]     SUBJECTIVE: Walked this morning, felt weak but not short of breath.  Back and hip pain.  Had chest pain at rest this am, none with ambulation.   Echo yesterday showed EF 65-70%, mild LVH, normal RV.      Marland Kitchen amitriptyline  10 mg Oral QHS  . amLODipine  10 mg Oral Daily  . aspirin  81 mg Oral Daily  . clopidogrel  75 mg Oral Q breakfast  . divalproex  500 mg Oral Q24H  . furosemide  20 mg Oral Daily  . glimepiride  4 mg Oral BID  . heparin  5,000 Units Subcutaneous Q8H  . insulin aspart  0-15 Units Subcutaneous TID WC  . insulin aspart  0-5 Units Subcutaneous QHS  . isosorbide mononitrate  30 mg Oral Q24H  . latanoprost  1 drop Both Eyes QHS  . lisinopril  20 mg Oral Daily  . metoprolol tartrate  25 mg Oral BID  . omega-3 acid ethyl esters  1 g Oral BID  . pantoprazole  40 mg Oral Q1200  . potassium chloride  10 mEq Oral Daily  . predniSONE  3 mg Oral Q breakfast  . simvastatin  20 mg Oral QHS  . timolol  1 drop Both Eyes Q24H  . DISCONTD: omega-3 acid ethyl esters  2 g Oral BID  . DISCONTD: predniSONE  1 mg Oral TID      Filed Vitals:   01/19/11 1300 01/19/11 1944 01/19/11 2100 01/20/11 0545  BP: 114/66 138/82 136/70 136/69  Pulse: 61  64 63  Temp: 97.6 F (36.4 C)  98.9 F (37.2 C) 98.1 F (36.7 C)  TempSrc: Oral     Resp: 18  16 16   Height:      Weight:      SpO2: 98%  96% 97%    Intake/Output Summary (Last 24 hours) at 01/20/11 0958 Last data filed at 01/20/11 0700  Gross per 24 hour  Intake    360 ml  Output      0 ml  Net    360 ml    LABS: Basic Metabolic Panel:  Basename 01/20/11 0616 01/19/11 0600  NA 137 142  K 3.4* 4.2  CL 102 103  CO2 27 29  GLUCOSE 183* 196*  BUN 20 14  CREATININE 1.31* 1.20*  CALCIUM 9.4 9.3  MG -- --  PHOS -- --   Liver Function Tests: No results found for this basename: AST:2,ALT:2,ALKPHOS:2,BILITOT:2,PROT:2,ALBUMIN:2 in the  last 72 hours No results found for this basename: LIPASE:2,AMYLASE:2 in the last 72 hours CBC:  Basename 01/19/11 0600 01/18/11 1809  WBC 5.3 6.1  NEUTROABS -- 2.8  HGB 11.1* 11.7*  HCT 33.6* 34.7*  MCV 88.0 87.2  PLT 130* 129*   Cardiac Enzymes: No results found for this basename: CKTOTAL:3,CKMB:3,CKMBINDEX:3,TROPONINI:3 in the last 72 hours BNP: No components found with this basename: POCBNP:3 D-Dimer: No results found for this basename: DDIMER:2 in the last 72 hours Hemoglobin A1C:  Basename 01/18/11 1809  HGBA1C 9.1*   Fasting Lipid Panel:  Basename 01/19/11 0600  CHOL 302*  HDL 45  LDLCALC UNABLE TO CALCULATE IF TRIGLYCERIDE OVER 400 mg/dL  TRIG 782*  CHOLHDL 6.7  LDLDIRECT --   Thyroid Function Tests:  Basename 01/18/11 1809  TSH 3.885  T4TOTAL --  T3FREE --  THYROIDAB --   Anemia Panel: No results found for this basename:  VITAMINB12,FOLATE,FERRITIN,TIBC,IRON,RETICCTPCT in the last 72 hours  RADIOLOGY: No results found.  PHYSICAL EXAM General: NAD Neck: No JVD, no thyromegaly or thyroid nodule.  Lungs: Clear to auscultation bilaterally with normal respiratory effort. CV: Nondisplaced PMI.  Heart regular S1/S2, no S3/S4, no murmur.  No peripheral edema.    Abdomen: Soft, nontender, no hepatosplenomegaly, no distention.  Neurologic: Alert and oriented x 3.  Psych: Normal affect. Extremities: No clubbing or cyanosis.   ASSESSMENT AND PLAN:  77 yo with known CAD s/p LAD and CFX PCIs went to [facility] because of profound exertional dyspnea. Left heart cath showed patent stents, 95% stenosis in small to moderate D1 which is unchanged from prior cath.  1. Exertional dyspnea: No new CAD.  She certainly could be having anginal symptoms from the D1 stenosis but unsure why this would be acutely worse as no change from the cath in the summer.  She does not appear significantly volume overloaded on exam.  Lungs were clear on CXR at [facility].  Oxygen  saturation is normal, no fever.   - Added Imdur and metoprolol to regimen => if exertional dyspnea is anginal equivalent, this should help.  Will increase Imdur to 60 mg daily for discharge, but do not think that rest chest pain she had today is anginal.  She had no chest pain with exertion and she has a stable diagonal lesion.  - Added low dose of Lasix, replete K. 2. CAD: Stable.  D1 is small, not good PCI target.  Continue medical management.  Added metoprolol, Imdur.  3. Very high triglycerides: added Lovaza.  4. May go home today on current cardiac medications (increased Imdur today).  Will need followup in 7 days in [place] with Dr. Myrtis Ser, Dr. Kirke Corin, or Gene to see if she is feeling better.   Marca Ancona 01/20/2011 9:58 AM

## 2011-01-21 ENCOUNTER — Telehealth: Payer: Self-pay | Admitting: *Deleted

## 2011-01-21 DIAGNOSIS — Z79899 Other long term (current) drug therapy: Secondary | ICD-10-CM

## 2011-01-21 DIAGNOSIS — N289 Disorder of kidney and ureter, unspecified: Secondary | ICD-10-CM

## 2011-01-21 NOTE — Progress Notes (Signed)
CARE MANAGEMENT NOTE 01/21/2011  Patient:  Sharon Hubbard, Sharon Hubbard   Account Number:  [id number]  Date Initiated:  01/19/2011  Documentation initiated by:  [facility]  Subjective/Objective Assessment:   CAD s/p LAD and CFX PCIs     Action/Plan:   Anticipated DC Date:  01/20/2011   Anticipated DC Plan:  HOME W HOME HEALTH SERVICES      DC Planning Services  CM consult  [facility]      Choice offered to / List presented to:             Status of service:  Completed, signed off Medicare Important Message given?   (If response is "NO", the following Medicare IM given date fields will be blank) Date Medicare IM given:   Date Additional Medicare IM given:    Discharge Disposition:  HOME/SELF CARE  Per UR Regulation:    Comments:  01/19/2011 1300 Pt states she needs assistance in the home with ADL's. She does not have a long term care policy or veterans benefits. Explained to pt that Medicare will not cover personal care services. States she cannot afford those services out of pocket. Offerred pt list of private duty agencies. States he could not afford. Has neighbors and friends that assist a few days per week. Isidoro Donning RN CCM Case Mgmt phone [phone]

## 2011-01-21 NOTE — Telephone Encounter (Signed)
Pt aware to have labs done before f/u appt w/Dr. Myrtis Ser.

## 2011-01-23 ENCOUNTER — Encounter (HOSPITAL_COMMUNITY): Payer: Self-pay

## 2011-01-23 DIAGNOSIS — G8929 Other chronic pain: Secondary | ICD-10-CM | POA: Diagnosis not present

## 2011-01-23 DIAGNOSIS — Z8541 Personal history of malignant neoplasm of cervix uteri: Secondary | ICD-10-CM | POA: Diagnosis not present

## 2011-01-23 DIAGNOSIS — K449 Diaphragmatic hernia without obstruction or gangrene: Secondary | ICD-10-CM | POA: Diagnosis not present

## 2011-01-23 DIAGNOSIS — Z7982 Long term (current) use of aspirin: Secondary | ICD-10-CM | POA: Diagnosis not present

## 2011-01-23 DIAGNOSIS — Z79899 Other long term (current) drug therapy: Secondary | ICD-10-CM | POA: Diagnosis not present

## 2011-01-23 DIAGNOSIS — Z7902 Long term (current) use of antithrombotics/antiplatelets: Secondary | ICD-10-CM | POA: Diagnosis not present

## 2011-01-23 DIAGNOSIS — M545 Low back pain, unspecified: Secondary | ICD-10-CM | POA: Diagnosis not present

## 2011-01-23 DIAGNOSIS — N959 Unspecified menopausal and perimenopausal disorder: Secondary | ICD-10-CM | POA: Diagnosis not present

## 2011-01-23 DIAGNOSIS — Z9861 Coronary angioplasty status: Secondary | ICD-10-CM | POA: Diagnosis not present

## 2011-01-23 DIAGNOSIS — R5381 Other malaise: Secondary | ICD-10-CM | POA: Diagnosis not present

## 2011-01-23 DIAGNOSIS — I10 Essential (primary) hypertension: Secondary | ICD-10-CM | POA: Diagnosis not present

## 2011-01-23 DIAGNOSIS — IMO0002 Reserved for concepts with insufficient information to code with codable children: Secondary | ICD-10-CM | POA: Diagnosis not present

## 2011-01-23 DIAGNOSIS — I2510 Atherosclerotic heart disease of native coronary artery without angina pectoris: Secondary | ICD-10-CM | POA: Diagnosis not present

## 2011-01-23 DIAGNOSIS — E119 Type 2 diabetes mellitus without complications: Secondary | ICD-10-CM | POA: Diagnosis not present

## 2011-01-23 DIAGNOSIS — I724 Aneurysm of artery of lower extremity: Secondary | ICD-10-CM | POA: Diagnosis not present

## 2011-01-24 DIAGNOSIS — R0989 Other specified symptoms and signs involving the circulatory and respiratory systems: Secondary | ICD-10-CM | POA: Diagnosis not present

## 2011-01-24 DIAGNOSIS — R1900 Intra-abdominal and pelvic swelling, mass and lump, unspecified site: Secondary | ICD-10-CM | POA: Diagnosis not present

## 2011-01-25 ENCOUNTER — Other Ambulatory Visit: Payer: Self-pay

## 2011-01-25 ENCOUNTER — Inpatient Hospital Stay (HOSPITAL_COMMUNITY)
Admission: AD | Admit: 2011-01-25 | Discharge: 2011-01-26 | DRG: 315 | Disposition: A | Discharge status: Home / Self Care | Payer: Medicare Other | Source: Ambulatory Visit | Attending: Internal Medicine | Admitting: Internal Medicine

## 2011-01-25 DIAGNOSIS — I2510 Atherosclerotic heart disease of native coronary artery without angina pectoris: Secondary | ICD-10-CM | POA: Diagnosis present

## 2011-01-25 DIAGNOSIS — Z79899 Other long term (current) drug therapy: Secondary | ICD-10-CM

## 2011-01-25 DIAGNOSIS — I10 Essential (primary) hypertension: Secondary | ICD-10-CM | POA: Diagnosis present

## 2011-01-25 DIAGNOSIS — Y840 Cardiac catheterization as the cause of abnormal reaction of the patient, or of later complication, without mention of misadventure at the time of the procedure: Secondary | ICD-10-CM | POA: Diagnosis present

## 2011-01-25 DIAGNOSIS — Z7982 Long term (current) use of aspirin: Secondary | ICD-10-CM | POA: Diagnosis not present

## 2011-01-25 DIAGNOSIS — Z7902 Long term (current) use of antithrombotics/antiplatelets: Secondary | ICD-10-CM | POA: Diagnosis not present

## 2011-01-25 DIAGNOSIS — M353 Polymyalgia rheumatica: Secondary | ICD-10-CM | POA: Diagnosis present

## 2011-01-25 DIAGNOSIS — M79609 Pain in unspecified limb: Secondary | ICD-10-CM | POA: Diagnosis not present

## 2011-01-25 DIAGNOSIS — E785 Hyperlipidemia, unspecified: Secondary | ICD-10-CM | POA: Insufficient documentation

## 2011-01-25 DIAGNOSIS — N289 Disorder of kidney and ureter, unspecified: Secondary | ICD-10-CM | POA: Diagnosis present

## 2011-01-25 DIAGNOSIS — Z859 Personal history of malignant neoplasm, unspecified: Secondary | ICD-10-CM | POA: Diagnosis not present

## 2011-01-25 DIAGNOSIS — K219 Gastro-esophageal reflux disease without esophagitis: Secondary | ICD-10-CM | POA: Insufficient documentation

## 2011-01-25 DIAGNOSIS — E119 Type 2 diabetes mellitus without complications: Secondary | ICD-10-CM | POA: Diagnosis present

## 2011-01-25 DIAGNOSIS — T82897A Other specified complication of cardiac prosthetic devices, implants and grafts, initial encounter: Principal | ICD-10-CM | POA: Diagnosis present

## 2011-01-25 DIAGNOSIS — I498 Other specified cardiac arrhythmias: Secondary | ICD-10-CM | POA: Diagnosis present

## 2011-01-25 DIAGNOSIS — Z8673 Personal history of transient ischemic attack (TIA), and cerebral infarction without residual deficits: Secondary | ICD-10-CM

## 2011-01-25 DIAGNOSIS — Z794 Long term (current) use of insulin: Secondary | ICD-10-CM

## 2011-01-25 DIAGNOSIS — K863 Pseudocyst of pancreas: Secondary | ICD-10-CM | POA: Diagnosis present

## 2011-01-25 DIAGNOSIS — K862 Cyst of pancreas: Secondary | ICD-10-CM | POA: Diagnosis present

## 2011-01-25 DIAGNOSIS — E1165 Type 2 diabetes mellitus with hyperglycemia: Secondary | ICD-10-CM | POA: Insufficient documentation

## 2011-01-25 DIAGNOSIS — I4510 Unspecified right bundle-branch block: Secondary | ICD-10-CM | POA: Diagnosis present

## 2011-01-25 DIAGNOSIS — Z9861 Coronary angioplasty status: Secondary | ICD-10-CM | POA: Diagnosis not present

## 2011-01-25 DIAGNOSIS — S301XXA Contusion of abdominal wall, initial encounter: Secondary | ICD-10-CM | POA: Diagnosis present

## 2011-01-25 LAB — CBC
HCT: 37.6 % (ref 36.0–46.0)
Hemoglobin: 12.9 g/dL (ref 12.0–15.0)
MCH: 29.7 pg (ref 26.0–34.0)
MCV: 86.6 fL (ref 78.0–100.0)
RBC: 4.34 MIL/uL (ref 3.87–5.11)

## 2011-01-25 LAB — BASIC METABOLIC PANEL
BUN: 19 mg/dL (ref 6–23)
Chloride: 100 mEq/L (ref 96–112)
Glucose, Bld: 227 mg/dL — ABNORMAL HIGH (ref 70–99)
Potassium: 3.6 mEq/L (ref 3.5–5.1)

## 2011-01-25 LAB — GLUCOSE, CAPILLARY
Glucose-Capillary: 156 mg/dL — ABNORMAL HIGH (ref 70–99)
Glucose-Capillary: 219 mg/dL — ABNORMAL HIGH (ref 70–99)

## 2011-01-25 MED ORDER — INSULIN DETEMIR 100 UNIT/ML ~~LOC~~ SOLN
20.0000 [IU] | Freq: Every day | SUBCUTANEOUS | Status: DC
  Administered 2011-01-25: 22:00:00 20 [IU] via SUBCUTANEOUS
  Filled 2011-01-25: qty 3

## 2011-01-25 MED ORDER — TIMOLOL MALEATE 0.5 % OP SOLN
1.0000 [drp] | Freq: Every day | OPHTHALMIC | Status: DC
  Administered 2011-01-25 – 2011-01-26 (×2): 1 [drp] via OPHTHALMIC
  Filled 2011-01-25: qty 5

## 2011-01-25 MED ORDER — LATANOPROST 0.005 % OP SOLN
1.0000 [drp] | Freq: Every day | OPHTHALMIC | Status: DC
  Administered 2011-01-25: 1 [drp] via OPHTHALMIC
  Filled 2011-01-25: qty 2.5

## 2011-01-25 MED ORDER — PREDNISONE 1 MG PO TABS
3.0000 mg | ORAL_TABLET | ORAL | Status: DC
  Administered 2011-01-25 – 2011-01-26 (×2): 3 mg via ORAL
  Filled 2011-01-25 (×3): qty 3

## 2011-01-25 MED ORDER — PANTOPRAZOLE SODIUM 40 MG PO TBEC
40.0000 mg | DELAYED_RELEASE_TABLET | Freq: Every day | ORAL | Status: DC
  Administered 2011-01-25: 40 mg via ORAL
  Filled 2011-01-25: qty 1

## 2011-01-25 MED ORDER — ROSUVASTATIN CALCIUM 40 MG PO TABS
40.0000 mg | ORAL_TABLET | Freq: Every day | ORAL | Status: DC
  Administered 2011-01-25 – 2011-01-26 (×2): 40 mg via ORAL
  Filled 2011-01-25 (×2): qty 1

## 2011-01-25 MED ORDER — SODIUM CHLORIDE 0.9 % IJ SOLN
3.0000 mL | Freq: Two times a day (BID) | INTRAMUSCULAR | Status: DC
  Administered 2011-01-25: 22:00:00 3 mL via INTRAVENOUS

## 2011-01-25 MED ORDER — LISINOPRIL 20 MG PO TABS
20.0000 mg | ORAL_TABLET | Freq: Every day | ORAL | Status: DC
  Administered 2011-01-25 – 2011-01-26 (×2): 20 mg via ORAL
  Filled 2011-01-25 (×2): qty 1

## 2011-01-25 MED ORDER — INSULIN ASPART 100 UNIT/ML ~~LOC~~ SOLN
0.0000 [IU] | Freq: Three times a day (TID) | SUBCUTANEOUS | Status: DC
  Administered 2011-01-25: 14:00:00 5 [IU] via SUBCUTANEOUS
  Administered 2011-01-25 – 2011-01-26 (×2): 3 [IU] via SUBCUTANEOUS
  Filled 2011-01-25: qty 3

## 2011-01-25 MED ORDER — FUROSEMIDE 20 MG PO TABS
20.0000 mg | ORAL_TABLET | Freq: Every day | ORAL | Status: DC
  Administered 2011-01-25 – 2011-01-26 (×2): 20 mg via ORAL
  Filled 2011-01-25 (×2): qty 1

## 2011-01-25 MED ORDER — SODIUM CHLORIDE 0.9 % IV SOLN: 250.0000 mL | INTRAVENOUS | Status: DC | PRN

## 2011-01-25 MED ORDER — ACETAMINOPHEN 325 MG PO TABS: 650.0000 mg | ORAL_TABLET | ORAL | Status: DC | PRN

## 2011-01-25 MED ORDER — ALPRAZOLAM 0.25 MG PO TABS: 0.2500 mg | ORAL_TABLET | Freq: Two times a day (BID) | ORAL | Status: DC | PRN

## 2011-01-25 MED ORDER — POTASSIUM CHLORIDE CRYS ER 20 MEQ PO TBCR
20.0000 meq | EXTENDED_RELEASE_TABLET | Freq: Every day | ORAL | Status: DC
  Administered 2011-01-25 – 2011-01-26 (×2): 20 meq via ORAL
  Filled 2011-01-25 (×2): qty 1

## 2011-01-25 MED ORDER — AMLODIPINE BESYLATE 10 MG PO TABS
10.0000 mg | ORAL_TABLET | Freq: Every day | ORAL | Status: DC
  Administered 2011-01-25 – 2011-01-26 (×2): 10 mg via ORAL
  Filled 2011-01-25 (×2): qty 1

## 2011-01-25 MED ORDER — ONDANSETRON HCL 4 MG/2ML IJ SOLN: 4.0000 mg | Freq: Four times a day (QID) | INTRAMUSCULAR | Status: DC | PRN

## 2011-01-25 MED ORDER — DIVALPROEX SODIUM ER 500 MG PO TB24
500.0000 mg | ORAL_TABLET | Freq: Every day | ORAL | Status: DC
  Administered 2011-01-25 – 2011-01-26 (×2): 500 mg via ORAL
  Filled 2011-01-25 (×2): qty 1

## 2011-01-25 MED ORDER — GLYBURIDE 5 MG PO TABS
5.0000 mg | ORAL_TABLET | Freq: Every day | ORAL | Status: DC
  Filled 2011-01-25 (×2): qty 1

## 2011-01-25 MED ORDER — CLOPIDOGREL BISULFATE 75 MG PO TABS
75.0000 mg | ORAL_TABLET | Freq: Every day | ORAL | Status: DC
Start: 2011-01-25 — End: 2011-01-26
  Administered 2011-01-25 – 2011-01-26 (×2): 75 mg via ORAL
  Filled 2011-01-25 (×2): qty 1

## 2011-01-25 MED ORDER — ASPIRIN 81 MG PO CHEW
81.0000 mg | CHEWABLE_TABLET | Freq: Every day | ORAL | Status: DC
  Administered 2011-01-25 – 2011-01-26 (×2): 81 mg via ORAL
  Filled 2011-01-25 (×2): qty 1

## 2011-01-25 MED ORDER — METOPROLOL TARTRATE 25 MG PO TABS
25.0000 mg | ORAL_TABLET | Freq: Two times a day (BID) | ORAL | Status: DC
  Administered 2011-01-25 – 2011-01-26 (×3): 25 mg via ORAL
  Filled 2011-01-25 (×4): qty 1

## 2011-01-25 MED ORDER — HYDROCODONE-ACETAMINOPHEN 5-325 MG PO TABS
1.0000 | ORAL_TABLET | Freq: Four times a day (QID) | ORAL | Status: DC | PRN
  Administered 2011-01-25 – 2011-01-26 (×2): 1 via ORAL
  Filled 2011-01-25 (×2): qty 1

## 2011-01-25 MED ORDER — SODIUM CHLORIDE 0.9 % IJ SOLN: 3.0000 mL | INTRAMUSCULAR | Status: DC | PRN

## 2011-01-25 MED ORDER — ASPIRIN 81 MG PO TABS: 81.0000 mg | ORAL_TABLET | Freq: Every day | ORAL | Status: DC

## 2011-01-25 MED ORDER — OMEGA-3-ACID ETHYL ESTERS 1 G PO CAPS
1.0000 g | ORAL_CAPSULE | Freq: Two times a day (BID) | ORAL | Status: DC
  Administered 2011-01-25 – 2011-01-26 (×3): 1 g via ORAL
  Filled 2011-01-25 (×4): qty 1

## 2011-01-25 MED ORDER — NITROGLYCERIN 0.4 MG SL SUBL: 0.4000 mg | SUBLINGUAL_TABLET | SUBLINGUAL | Status: DC | PRN

## 2011-01-25 MED ORDER — OMEGA-3 FATTY ACIDS 1000 MG PO CAPS: 1.0000 g | ORAL_CAPSULE | Freq: Two times a day (BID) | ORAL | Status: DC

## 2011-01-25 MED ORDER — ISOSORBIDE MONONITRATE ER 60 MG PO TB24
60.0000 mg | ORAL_TABLET | ORAL | Status: DC
  Administered 2011-01-25 – 2011-01-26 (×2): 60 mg via ORAL
  Filled 2011-01-25 (×2): qty 1

## 2011-01-25 NOTE — Progress Notes (Signed)
Patient Name: Sharon Hubbard Date of Encounter: 01/25/2011 The patient had recently been at [facility] for heart catheterization. This study did not show any significant obstructive disease. She had been having shortness of breath before this catheterization was done. After it was done she said she was feeling better. There was then concerned about the possibility of a hematoma in her right groin at the cath site. She is also concerned about a few small areas of ecchymoses on her abdomen. She is on aspirin and Plavix. When she arrived at the emergency room at [facility] she was seen by Dr. Earnestine Leys. He was concerned because he did hear a high pitched sound in the area of the right groin. He had a portable ultrasound machine with him and thought that he might have seen flow that could represent a pseudoaneurysm. In the past the patient had a major bleed after a catheterization. She and her family were extremely concerned. Decision was made therefore to bring her to [facility] for more complete evaluation. It is noted that the Doppler exam done at [facility] did not show any definite pseudoaneurysm. She is not having any significant pain in this area.  Principal Problem:  *Groin hematoma Active Problems:  CAD (coronary artery disease)  Hypertension  Diabetes mellitus  Dyslipidemia  GERD (gastroesophageal reflux disease)    SUBJECTIVE:  Pt in good spirits. Reports R groin swelling, bruising, and tenderness which have increased since cardiac catheterization. Denies drainage, bleeding, pus, fevers, or chills. Resting comfortably in bed.  OBJECTIVE  Filed Vitals:   01/25/11 0430 01/25/11 0700 01/25/11 0823  BP: 138/69 138/69 151/54  Pulse: 56 58 59  Temp: 97.9 F (36.6 C) 97.9 F (36.6 C) 97.5 F (36.4 C)  TempSrc: Oral Oral Oral  Resp: 13 15 18   Height: 5\' 3"  (1.6 m) 5\' 3"  (1.6 m)   Weight: 71.2 kg (156 lb 15.5 oz) 70.6 kg (155 lb 10.3 oz)   SpO2: 96% 91% 98%   No  intake or output data in the 24 hours ending 01/25/11 0939 Weight change:   PHYSICAL EXAM  General: Well developed, well nourished, NAD Head: Normocephalic, atraumatic, sclera non-icteric, no xanthomas Neck: Supple without bruits or JVD. Lungs:  Resp regular and unlabored, CTAB without wheezes, rales or rhonchi Heart: RRR, II/VI crescendo-decrescendo systolic murmur at LUSB, no s3, s4, rubs or thrills Abdomen: Soft, mild tenderness to palpation, diffuse ecchymosis,  mildly edematous- RLQ, non-distended, BS + x 4.  Msk:  Strength and tone appears normal for age. Extremities: Mild edema and ecchymosis to R groin, , no clubbing. DP/PT/Radials 2+ and equal bilaterally. With careful repeated evaluation there is a high pitched sound heard in the area of the cath site. Neuro: Alert and oriented X 3. Moves all extremities spontaneously. Psych: Normal affect. The patient is very concerned about the small areas of ecchymoses on her abdomen. She is not having any significant pain in her groin at this time. LABS:  Lab 01/20/11 0616 01/19/11 0600 01/18/11 1809  NA 137 142 139  K 3.4* 4.2 3.7  CL 102 103 101  CO2 27 29 28   BUN 20 14 16   CREATININE 1.31* 1.20* 1.19*  CALCIUM 9.4 9.3 9.6  PROT -- -- --  BILITOT -- -- --  ALKPHOS -- -- --  ALT -- -- --  AST -- -- --  AMYLASE -- -- --  LIPASE -- -- --  GLUCOSE 183* 196* 138*   TELE: NSR, 60-70 bpm  ECG: NSR, 62 bpm, nonspecific IVB  Radiology/Studies:  No results found.  Current Medications:     . amLODipine  10 mg Oral Daily  . aspirin  81 mg Oral Daily  . clopidogrel  75 mg Oral QPC breakfast  . divalproex  500 mg Oral Daily  . furosemide  20 mg Oral Daily  . glyBURIDE  5 mg Oral Q breakfast  . insulin aspart  0-15 Units Subcutaneous TID WC  . insulin detemir  20 Units Subcutaneous QHS  . isosorbide mononitrate  60 mg Oral Q24H  . latanoprost  1 drop Both Eyes QHS  . lisinopril  20 mg Oral Daily  . metoprolol tartrate  25 mg  Oral BID  . omega-3 acid ethyl esters  1 g Oral BID  . pantoprazole  40 mg Oral Q lunch  . potassium chloride SA  20 mEq Oral Daily  . predniSONE  3 mg Oral Q0700  . rosuvastatin  40 mg Oral Daily  . sodium chloride  3 mL Intravenous Q12H  . timolol  1 drop Both Eyes Daily  . DISCONTD: aspirin  81 mg Oral Daily  . DISCONTD: fish oil-omega-3 fatty acids  1 g Oral BID    ASSESSMENT AND PLAN:  Sharon Hubbard is a 78 yo Caucasian female with PMHx significant for type 2 DM, HTN, HL and CAD s/p recent cardiac catheterization on 12/28 (for unstable angina, medically managed) who presented to [facility] ED with R groin swelling, tenderness and bruising. On exam, R groin mass was palpated with + bruit. R lower extremity arterial duplex performed at [facility] was negative for arterial injury without sonographic evidence of pseudoaneurysm or AV fistula. She was transferred from [facility] yesterday for suspected pseudoaneurysm of R groin. Admission orders begun at [facility] have been entered. Have called Medical Records at [facility] and they are working on finding cardiology consult note, to be faxed to floor.   R groin swelling, ecchymosis- mild tenderness to palpation, mild edema and ecchymosis, no appreciable bruit today. R lower extremity arterial duplex performed at [facility] was negative for arterial injury without sonographic evidence of pseudoaneurysm or AV fistula.   - Arterial dopplers ordered  - Vascular to see and evaluate need for thrombin injection  Signed, R. Hurman Horn, PA-C 01/25/2011, 9:39 AM Patient seen and examined. I agree with the assessment and plan as detailed above. See also my additional thoughts below.   As part of this evaluation I have entered the subjective information in the first part of the note above. I have spoken with Dr. Andee Lineman. The patient's overall cardiac status is stable. Her hemoglobin is stable. It is unlikely that the areas of ecchymoses on her abdomen represent any significant  problem. The issue is whether or not the patient has a pseudoaneurysm. It is noted that Dr. Earnestine Leys thought that he may have seen a small area of abnormal flow with a portable ultrasound. The formal Doppler done of her groin at [facility] did not show significant abnormality. I do hear a soft high pitched sound in the area of the cath site after careful evaluation. Etiology of this is not clear to me. The patient will have a repeat Doppler today and we will ask for vascular surgery input. The patient appears to be stable. She and her family are extremely concerned about her current situation.  [name], MD, [facility] 01/25/2011 10:14 AM

## 2011-01-26 DIAGNOSIS — S301XXA Contusion of abdominal wall, initial encounter: Secondary | ICD-10-CM | POA: Diagnosis not present

## 2011-01-26 DIAGNOSIS — M79609 Pain in unspecified limb: Secondary | ICD-10-CM

## 2011-01-26 LAB — GLUCOSE, CAPILLARY

## 2011-01-26 NOTE — Discharge Summary (Signed)
Physician Discharge Summary  Patient ID: Sharon Hubbard MRN: [id number] DOB/AGE: 1933-12-01 78 y.o.  Admit date: 01/25/2011 Discharge date: 01/26/2011  Primary Cardiologist: Jerral Bonito, MD  Primary Discharge Diagnosis: 1  Chronic right groin bruit  A  No definite evidence of PSA, AV fistula, or arterial stenosis  Secondary Discharge Diagnoses: CAD (coronary artery disease) status post cardiac catheterization and stent placement 4 months ago continue aspirin and Plavix  RBBB (right bundle branch block)  Hypertension   Diabetes mellitus   Dyslipidemia  GERD (gastroesophageal reflux disease)   Renal insufficiency   Polymyalgia rheumatica   Bradycardia   Ejection fraction-within normal limits   Pancreas cyst   Reason for Admission: 78 year old female, history of nonobstructive CAD by recent catheterization, admitted for further evaluation of right groin bruit, suggestive of possible PSA.  Procedures: R femoral artery duplex scan  Hospital Course: Patient was transferred directly from [facility] for more definitive evaluation of possible PSA, following physical exam findings of a loud right groin bruit, in context of recent catheterization. Of note, a right groin duplex scan at [facility], prior to transfer, was negative for definite evidence of PSA or AV fistula, despite obvious PE findings. A repeat study at [facility], however, was also negative for definite evidence of PSA or AV fistula, and no further workup was recommended.  Patient was cleared for discharge, with resumption of all previous home medications, including ASA and Plavix.  Discharge Vitals: Blood pressure 116/78, pulse 62, temperature 97.9 F (36.6 C), temperature source Oral, resp. rate 19, height 5\' 3"  (1.6 m), weight 156 lb 4.9 oz (70.9 kg), SpO2 97.00%.  Labs:   Lab Results  Component Value Date   WBC 5.5 01/25/2011   HGB 12.9 01/25/2011   HCT 37.6 01/25/2011   MCV 86.6 01/25/2011   PLT 159 01/25/2011      Lab 01/25/11 1044  NA 138  K 3.6  CL 100  CO2 26  BUN 19  CREATININE 1.22*  CALCIUM 10.4  PROT --  BILITOT --  ALKPHOS --  ALT --  AST --  GLUCOSE 227*   Lab Results  Component Value Date   CKTOTAL 54 09/20/2010   CKMB 5.7* 09/20/2010   TROPONINI 0.98* 09/20/2010    Lab Results  Component Value Date   CHOL 302* 01/19/2011   CHOL 286* 09/20/2010   Lab Results  Component Value Date   HDL 45 01/19/2011   HDL 61 09/20/2010   Lab Results  Component Value Date   LDLCALC UNABLE TO CALCULATE IF TRIGLYCERIDE OVER 400 mg/dL 40/98/1191   LDLCALC UNABLE TO CALCULATE IF TRIGLYCERIDE OVER 400 mg/dL 4/78/2956   Lab Results  Component Value Date   TRIG 461* 01/19/2011   TRIG 506* 09/20/2010   Lab Results  Component Value Date   CHOLHDL 6.7 01/19/2011   CHOLHDL 4.7 09/20/2010   No results found for this basename: LDLDIRECT     DISPOSITION: Stable condition  FOLLOW UP PLANS AND APPOINTMENTS: Discharge Orders    Future Appointments: Provider: Department: Dept Phone: Center:   01/28/2011 10:00 AM [name], MD [facility] [phone] [facility]     Future Orders Please Complete By Expires   Diet - low sodium heart healthy      Increase activity slowly          DISCHARGE MEDICATIONS: Current Discharge Medication List    CONTINUE these medications which have NOT CHANGED   Details  amLODipine (NORVASC) 10 MG tablet Take 10 mg by  mouth daily.      aspirin 81 MG tablet Take 81 mg by mouth daily.      atorvastatin (LIPITOR) 80 MG tablet Take 80 mg by mouth at bedtime.      clopidogrel (PLAVIX) 75 MG tablet Take 75 mg by mouth daily.      divalproex (DEPAKOTE) 500 MG 24 hr tablet Take 500 mg by mouth daily.      fish oil-omega-3 fatty acids 1000 MG capsule Take 1 g by mouth 2 (two) times daily.      furosemide (LASIX) 20 MG tablet Take 20 mg by mouth daily.      glimepiride (AMARYL) 4 MG tablet Take 4 mg by mouth 2 (two) times daily.       HYDROcodone-acetaminophen (VICODIN) 5-500 MG per tablet Take 1 tablet by mouth every 8 (eight) hours as needed. For pain     insulin detemir (LEVEMIR) 100 UNIT/ML injection Inject 20 Units into the skin at bedtime.      insulin lispro (HUMALOG) 100 UNIT/ML injection Inject 4-8 Units into the skin 3 (three) times daily before meals. Pt is on a sliding scale     isosorbide mononitrate (IMDUR) 60 MG 24 hr tablet Take 1 tablet (60 mg total) by mouth daily. Qty: 30 tablet, Refills: 5    latanoprost (XALATAN) 0.005 % ophthalmic solution Place 1 drop into both eyes at bedtime.      lisinopril (PRINIVIL,ZESTRIL) 20 MG tablet Take 20 mg by mouth daily.      metoprolol tartrate (LOPRESSOR) 25 MG tablet Take 1 tablet (25 mg total) by mouth 2 (two) times daily. Qty: 60 tablet, Refills: 5    pantoprazole (PROTONIX) 40 MG tablet Take 40 mg by mouth daily.      potassium chloride SA (K-DUR,KLOR-CON) 20 MEQ tablet Take 1 tablet (20 mEq total) by mouth daily. Qty: 30 tablet, Refills: 5    predniSONE (DELTASONE) 1 MG tablet Take 3 mg by mouth every morning.      timolol (TIMOPTIC) 0.5 % ophthalmic solution Place 1 drop into both eyes daily.     nitroGLYCERIN (NITROSTAT) 0.4 MG SL tablet Place 1 tablet (0.4 mg total) under the tongue every 5 (five) minutes as needed for chest pain. Qty: 25 tablet, Refills: 11        BRING ALL MEDICATIONS WITH YOU TO FOLLOW UP APPOINTMENTS  Time spent with patient to include physician time: Greater than 30 minutes, including physician time.  Signed: Gene Mccayla Shimada 01/26/2011, 11:12 AM Co-Sign MD

## 2011-01-26 NOTE — Progress Notes (Signed)
Sharon Bottoms, MD, [facility]IM Board Certified in Adult Cardiovascular Medicine,Internal Medicine and Critical Care Medicine    SUBJECTIVE: Patient was transferred from [facility] after concern for a right groin pseudoaneurysm. The patient clearly had an abnormal bruit in the right groin. She also had small hematoma. Dopplers were repeated here and preliminary results showed that there is no AV fistula or pseudoaneurysm. The patient states she is doing well. She has no groin pain. She reports no chest pain. She has remained on aspirin and Plavix and reports no bleeding complications.  Principal Problem:  *Groin hematoma Active Problems:  CAD (coronary artery disease)  Hypertension  Diabetes mellitus  Dyslipidemia  GERD (gastroesophageal reflux disease)   Past Medical History  Diagnosis Date  . CAD (coronary artery disease)     DES  ostial circumflex., April, 2011, Dr.Zachary, [place], residual 40% LAD and 50% diagonal  /  nuclear. April, 2012, no scar or ischemia  . Chest pain     [facility], April, 2012, atypical pain, nuclear no scar or ischemia  . Groin hematoma     April, 2011  . Hypertension   . Diabetes mellitus   . Dyslipidemia   . GERD (gastroesophageal reflux disease)   . Schatzki's ring   . Renal insufficiency   . Polymyalgia rheumatica   . Osteoarthritis     arthroscopic surgery right knee February, 2012  . Spinal stenosis     history of surgery for this  . Ejection fraction     EF 65%, echo, April, 2012, aortic valve sclerosis with very slight gradient  . Preoperative evaluation to rule out surgical contraindication     Patient needs back surgery by Dr. Channing Mutters, May, 2012                . Pancreas cyst     The patient has multiple pancreatic cysts.  This is followed at [facility]         . Complication of anesthesia     "felt like I was smothering once with mask on my face"  . RBBB (right bundle branch block)     old  . Bradycardia, sinus   .  Angina   . Myocardial infarction 2011; 08/2010  . Shortness of breath on exertion   . Blood transfusion   . Anemia   . Stroke 01/18/11    "I've had 2 TIA's"  . Cancer     ""had hysterectomy for a 6 showing cancer; think that's pretty strong    Current Facility-Administered Medications  Medication Dose Route Frequency Provider Last Rate Last Dose  . 0.9 %  sodium chloride infusion  250 mL Intravenous PRN Odella Aquas      . acetaminophen (TYLENOL) tablet 650 mg  650 mg Oral Q4H PRN Odella Aquas      . ALPRAZolam Prudy Feeler) tablet 0.25 mg  0.25 mg Oral BID PRN Odella Aquas      . amLODipine (NORVASC) tablet 10 mg  10 mg Oral Daily Roger Arguello   10 mg at 01/26/11 0903  . aspirin chewable tablet 81 mg  81 mg Oral Daily Roger Arguello   81 mg at 01/26/11 0903  . clopidogrel (PLAVIX) tablet 75 mg  75 mg Oral QPC breakfast Roger Arguello   75 mg at 01/26/11 0915  . divalproex (DEPAKOTE ER) 24 hr tablet 500 mg  500 mg Oral Daily Roger Arguello   500 mg at 01/26/11 0903  . furosemide (LASIX) tablet 20 mg  20 mg Oral Daily Roger Arguello   20 mg at 01/26/11 1610  . glyBURIDE (DIABETA) tablet 5 mg  5 mg Oral Q breakfast Odella Aquas      . HYDROcodone-acetaminophen (NORCO) 5-325 MG per tablet 1 tablet  1 tablet Oral Q6H PRN Roger Arguello   1 tablet at 01/26/11 0320  . insulin aspart (novoLOG) injection 0-15 Units  0-15 Units Subcutaneous TID WC Roger Arguello   3 Units at 01/26/11 0901  . insulin detemir (LEVEMIR) injection 20 Units  20 Units Subcutaneous QHS Roger Arguello   20 Units at 01/25/11 2131  . isosorbide mononitrate (IMDUR) 24 hr tablet 60 mg  60 mg Oral Q24H Roger Arguello   60 mg at 01/26/11 0904  . latanoprost (XALATAN) 0.005 % ophthalmic solution 1 drop  1 drop Both Eyes QHS Roger Arguello   1 drop at 01/25/11 2131  . lisinopril (PRINIVIL,ZESTRIL) tablet 20 mg  20 mg Oral Daily Roger Arguello   20 mg at 01/26/11 0904  . metoprolol tartrate (LOPRESSOR) tablet 25 mg  25 mg Oral  BID Roger Arguello   25 mg at 01/26/11 0904  . nitroGLYCERIN (NITROSTAT) SL tablet 0.4 mg  0.4 mg Sublingual Q5 min PRN Odella Aquas      . omega-3 acid ethyl esters (LOVAZA) capsule 1 g  1 g Oral BID Roger Arguello   1 g at 01/26/11 0904  . ondansetron (ZOFRAN) injection 4 mg  4 mg Intravenous Q6H PRN Odella Aquas      . pantoprazole (PROTONIX) EC tablet 40 mg  40 mg Oral Q lunch Roger Arguello   40 mg at 01/25/11 1027  . potassium chloride SA (K-DUR,KLOR-CON) CR tablet 20 mEq  20 mEq Oral Daily Roger Arguello   20 mEq at 01/26/11 0905  . predniSONE (DELTASONE) tablet 3 mg  3 mg Oral Q0700 Roger Arguello   3 mg at 01/26/11 0735  . rosuvastatin (CRESTOR) tablet 40 mg  40 mg Oral Daily Roger Arguello   40 mg at 01/26/11 0905  . sodium chloride 0.9 % injection 3 mL  3 mL Intravenous Q12H Roger Arguello   3 mL at 01/25/11 2134  . sodium chloride 0.9 % injection 3 mL  3 mL Intravenous PRN Odella Aquas      . timolol (TIMOPTIC) 0.5 % ophthalmic solution 1 drop  1 drop Both Eyes Daily Roger Arguello   1 drop at 01/26/11 0902    LABS: Basic Metabolic Panel:  Basename 01/25/11 1044  NA 138  K 3.6  CL 100  CO2 26  GLUCOSE 227*  BUN 19  CREATININE 1.22*  CALCIUM 10.4  MG --  PHOS --   Liver Function Tests: No results found for this basename: AST:2,ALT:2,ALKPHOS:2,BILITOT:2,PROT:2,ALBUMIN:2 in the last 72 hours No results found for this basename: LIPASE:2,AMYLASE:2 in the last 72 hours CBC:  Basename 01/25/11 1044  WBC 5.5  NEUTROABS --  HGB 12.9  HCT 37.6  MCV 86.6  PLT 159   Right femoral artery duplex completed. Preliminary report is negative for pseudoaneurysm, A-V fistula, or hematoma.  Smiley Houseman  01/26/2011, 9:12 AM   PHYSICAL EXAM  Filed Vitals:   01/26/11 0400 01/26/11 0500 01/26/11 0800 01/26/11 0904  BP: 121/56  116/78   Pulse: 50  53 62  Temp: 97.6 F (36.4 C)  97.9 F (36.6 C)   TempSrc: Oral  Oral   Resp: 15  19   Height:      Weight:  156 lb 4.9  oz (70.9 kg)    SpO2: 97%      BP 116/78  Pulse 62  Temp(Src) 97.9 F (36.6 C) (Oral)  Resp 19  Ht 5\' 3"  (1.6 m)  Wt 156 lb 4.9 oz (70.9 kg)  BMI 27.69 kg/m2  SpO2 97%  Intake/Output Summary (Last 24 hours) at 01/26/11 1103 Last data filed at 01/26/11 0300  Gross per 24 hour  Intake    600 ml  Output    200 ml  Net    400 ml   I/O last 3 completed shifts: In: 600 [P.O.:600] Out: 200 [Urine:200]  No physical examination was performed. I only listened to the patient's right groin and examined the right groin. There was no hematoma and a bruit is still present in the right groin.  TELEMETRY: Reviewed telemetry pt in normal sinus rhythm  ASSESSMENT AND PLAN:  1. Groin hematoma negative arterial Doppler for pseudoaneurysm or AV fistula  Persistent right groin bruit of unclear etiology, no evidence of arterial stenosis.     Patient Active Problem List  Diagnoses  . CAD (coronary artery disease) status post cardiac catheterization and stent placement 4 months ago continue aspirin and Plavix   . RBBB (right bundle branch block)  . Hypertension  . Diabetes mellitus  . Dyslipidemia  . GERD (gastroesophageal reflux disease)  . Renal insufficiency  . Polymyalgia rheumatica  . Bradycardia  . Ejection fraction-within normal limits   . Pancreas cyst     PLAN   Patient was reassured that there was no pseudoaneurysm or AV fistula.  I did explain to the patient that she has a chronic bruit in the right groin. Actually her daughter was interested in this and she actually listened to my stethoscope and also appreciated that we made her aware that there is a chronic bruit so that we will not have to go to his exercise again in the future.  The patient was very appreciative of her care. She has a followup appointment with Dr. Myrtis Ser in [place] on Monday I told the patient that she does not need to come to this appointment and that she be properly examined here I also notified Dr. Myrtis Ser  and I will send a message to the [facility] office to reschedule her appointment for approximately 3 months from now. I also told the patient can always call the office if she wants to be seen earlier.  Discharge is planned for today the patient will need to continue on aspirin and Plavix. She will also need to continue on all her other medications and there will be no changes in her medical therapy.   Alvin Critchley [facility] 01/26/2011, 11:03 AM

## 2011-01-26 NOTE — Progress Notes (Signed)
Right femoral artery duplex completed.  Preliminary report is negative for pseudoaneurysm, A-V fistula, or hematoma. Smiley Houseman 01/26/2011, 9:12 AM

## 2011-01-28 ENCOUNTER — Encounter: Payer: Medicare Other | Admitting: Cardiology

## 2011-01-28 NOTE — Discharge Summary (Signed)
Agree with plan as outlined above. No evidence of pseudoaneurysm. Resume normal activities. Patient will f/u in [place] office.

## 2011-02-08 ENCOUNTER — Telehealth: Payer: Self-pay | Admitting: *Deleted

## 2011-02-08 NOTE — Telephone Encounter (Signed)
Pt states she can't sleep. She is wide awake at night. She states her dgt told her to find out if she can take Melatonin b/c someone they know uses this for their child who can't sleep. Pt instructed to verify with her pharmacy to make sure this won't interact with any of her medications. She is aware a not will be sent to [place] in Dr. Henrietta Hoover absence for further evaluation. She is always aware follow up with primary MD regarding trouble sleeping.

## 2011-02-11 NOTE — Telephone Encounter (Signed)
Left message to call back w/female at residence.

## 2011-02-11 NOTE — Telephone Encounter (Signed)
No definite increased risk in CAD pts. It can, however, interact with anticoagulants. Pt is not on coumadin, and it didn't refer to either ASA or Plavix. Therefore, I see no reason why she can't use Melatonin.

## 2011-02-18 NOTE — Telephone Encounter (Signed)
Pt notified and verbalized understanding.

## 2011-02-19 ENCOUNTER — Encounter: Payer: Self-pay | Admitting: Cardiology

## 2011-02-19 DIAGNOSIS — I350 Nonrheumatic aortic (valve) stenosis: Secondary | ICD-10-CM | POA: Insufficient documentation

## 2011-02-19 DIAGNOSIS — M47817 Spondylosis without myelopathy or radiculopathy, lumbosacral region: Secondary | ICD-10-CM | POA: Diagnosis not present

## 2011-02-20 ENCOUNTER — Encounter: Payer: Self-pay | Admitting: Cardiology

## 2011-02-20 ENCOUNTER — Ambulatory Visit (INDEPENDENT_AMBULATORY_CARE_PROVIDER_SITE_OTHER): Payer: Medicare Other | Admitting: Cardiology

## 2011-02-20 DIAGNOSIS — I359 Nonrheumatic aortic valve disorder, unspecified: Secondary | ICD-10-CM | POA: Diagnosis not present

## 2011-02-20 DIAGNOSIS — R0989 Other specified symptoms and signs involving the circulatory and respiratory systems: Secondary | ICD-10-CM

## 2011-02-20 DIAGNOSIS — E785 Hyperlipidemia, unspecified: Secondary | ICD-10-CM

## 2011-02-20 DIAGNOSIS — I350 Nonrheumatic aortic (valve) stenosis: Secondary | ICD-10-CM

## 2011-02-20 DIAGNOSIS — I2510 Atherosclerotic heart disease of native coronary artery without angina pectoris: Secondary | ICD-10-CM | POA: Diagnosis not present

## 2011-02-20 MED ORDER — ROSUVASTATIN CALCIUM 20 MG PO TABS: 20.0000 mg | ORAL_TABLET | Freq: Every day | ORAL | Status: DC

## 2011-02-20 NOTE — Patient Instructions (Signed)
   Crestor 20mg  every evening Your physician wants you to follow up in: 6 months.  You will receive a reminder letter in the mail one-two months in advance.  If you don't receive a letter, please call our office to schedule the follow up appointment

## 2011-02-20 NOTE — Progress Notes (Signed)
HPI  Patient is seen to followup coronary disease. This is also a post hospital visit. I had seen her last in the office in September, 2012. At that time she was just recovering from coronary intervention. Since that time the patient was admitted December, 2012 for repeat catheterization. Stent was patent. There was some mild disease it was being followed medically.  The patient was then readmitted in January, 2013. She had discomfort in her right groin. A bruit was heard it was felt that there was concern for pseudoaneurysm. Patient was extremely anxious at that time a decision was made to transfer her to: For complete evaluation. The first Doppler of her groin question to pseudoaneurysm. Repeat study at, showed no pseudoaneurysm and no AV fistula. She stabilized and was discharged home. As part of today's evaluation I have reviewed the hospitalizations from December, 2012 and January, 2013.  Today she feels great. She's not having any further shortness of breath or chest pain. She had some difficulty sleeping but this is improved.  Allergies  Allergen Reactions  . Clarithromycin     REACTION: Vomiting and diarrhea.  . Codeine   . Sulfa Antibiotics   . Atorvastatin Other (See Comments)    Made her knees buckle and muscles ache    Current Outpatient Prescriptions  Medication Sig Dispense Refill  . amLODipine (NORVASC) 10 MG tablet Take 10 mg by mouth daily.        Marland Kitchen aspirin 81 MG tablet Take 81 mg by mouth daily.        . clopidogrel (PLAVIX) 75 MG tablet Take 75 mg by mouth daily.        . divalproex (DEPAKOTE) 500 MG 24 hr tablet Take 500 mg by mouth daily.        . fish oil-omega-3 fatty acids 1000 MG capsule Take 1 g by mouth 2 (two) times daily.        . furosemide (LASIX) 20 MG tablet Take 20 mg by mouth daily.        Marland Kitchen glimepiride (AMARYL) 4 MG tablet Take 4 mg by mouth 2 (two) times daily.        Marland Kitchen HYDROcodone-acetaminophen (VICODIN) 5-500 MG per tablet Take 1 tablet by mouth every  8 (eight) hours as needed. For pain       . insulin glargine (LANTUS) 100 UNIT/ML injection Inject into the skin at bedtime.      . insulin lispro (HUMALOG) 100 UNIT/ML injection Inject 4-8 Units into the skin 3 (three) times daily before meals. Pt is on a sliding scale       . isosorbide mononitrate (IMDUR) 60 MG 24 hr tablet Take 1 tablet (60 mg total) by mouth daily.  30 tablet  5  . latanoprost (XALATAN) 0.005 % ophthalmic solution Place 1 drop into both eyes at bedtime.        Marland Kitchen lisinopril (PRINIVIL,ZESTRIL) 20 MG tablet Take 20 mg by mouth daily.        . Melatonin 3 MG TABS Take 1 tablet by mouth at bedtime.      . metoprolol tartrate (LOPRESSOR) 25 MG tablet Take 1 tablet (25 mg total) by mouth 2 (two) times daily.  60 tablet  5  . nitroGLYCERIN (NITROSTAT) 0.4 MG SL tablet Place 1 tablet (0.4 mg total) under the tongue every 5 (five) minutes as needed for chest pain.  25 tablet  11  . pantoprazole (PROTONIX) 40 MG tablet Take 40 mg by mouth daily.        Marland Kitchen  potassium chloride SA (K-DUR,KLOR-CON) 20 MEQ tablet Take 1 tablet (20 mEq total) by mouth daily.  30 tablet  5  . predniSONE (DELTASONE) 1 MG tablet Take 3 mg by mouth every morning.        . timolol (TIMOPTIC) 0.5 % ophthalmic solution Place 1 drop into both eyes daily.         History   Social History  . Marital Status: Married    Spouse Name: N/A    Number of Children: N/A  . Years of Education: N/A   Occupational History  . Not on file.   Social History Main Topics  . Smoking status: Never Smoker   . Smokeless tobacco: Never Used  . Alcohol Use: No  . Drug Use: No  . Sexually Active: No   Other Topics Concern  . Not on file   Social History Narrative  . No narrative on file    No family history on file.  Past Medical History  Diagnosis Date  . CAD (coronary artery disease)     DES  ostial circumflex., April, 2011, Dr.Zachary, [place], residual 40% LAD and 50% diagonal  /  nuclear. April, 2012, no  scar or ischemia  . Groin hematoma     April, 2011  . Hypertension   . Diabetes mellitus   . Dyslipidemia   . GERD (gastroesophageal reflux disease)   . Schatzki's ring   . Renal insufficiency   . Polymyalgia rheumatica   . Osteoarthritis     arthroscopic surgery right knee February, 2012  . Spinal stenosis     history of surgery for this  . Ejection fraction     EF 65%, echo, April, 2012, aortic valve sclerosis with very slight gradient  . Preoperative evaluation to rule out surgical contraindication     Patient needs back surgery by Dr. Channing Mutters, May, 2012                . Pancreas cyst     The patient has multiple pancreatic cysts.  This is followed at [facility]         . Complication of anesthesia     "felt like I was smothering once with mask on my face"  . RBBB (right bundle branch block)     old  . Bradycardia, sinus   . Myocardial infarction 2011; 08/2010  . Shortness of breath on exertion   . Blood transfusion   . Anemia   . Stroke 01/18/11    "I've had 2 TIA's"  . Cancer     ""had hysterectomy for a 6 showing cancer; think that's pretty strong  . Aortic stenosis     very mild, echo 12/2010  . Femoral bruit     01/2011 hosp, but no pseudoaneurysm or AV fistula    Past Surgical History  Procedure Date  . Coronary angioplasty with stent placement 2011    "in [place]"  . Coronary angioplasty with stent placement 08/2010  . Cardiac catheterization 01/18/11  . Cholecystectomy 1987  . Appendectomy   . Back surgery   . Lumbar spine surgery ~ 1977; ~ 2010    "2 hugh ruptured discs; I was paralyzed first OR; 2nd OR by Dr. Channing Mutters, don't know what for"  . Tubal ligation 1970's  . Dilation and curettage of uterus   . Abdominal hysterectomy ~ 1974  . Knee arthroscopy 02/2010    right knee; chrondoplasty medial femoral condyle;  Partial medial meniscectomy.     ROS  Patient denies fever, chills, headache, sweats, rash, change in vision, change in hearing, chest pain, cough,  nausea vomiting, urinary symptoms. All other systems are reviewed and are negative.  PHYSICAL EXAM  Patient here with her husband of 61 years today. She is oriented to person time and place. Affect is normal. There is no jugulovenous distention. Lungs are clear. Respiratory effort is nonlabored. Cardiac exam reveals S1 and S2. There no clicks. There is a crescendo decrescendo murmur compatible with her very mild aortic stenosis.. The abdomen is soft. There is no peripheral edema. No musculoskeletal deformities. No skin rashes.  Filed Vitals:   02/20/11 1051  BP: 126/70  Pulse: 54  Height: 5\' 3"  (1.6 m)  Weight: 161 lb (73.029 kg)  SpO2: 97%    ASSESSMENT & PLAN

## 2011-02-20 NOTE — Assessment & Plan Note (Signed)
She has a soft femoral bruit. However we know that this is not pseudoaneurysm or AV fistula. No further workup is needed.

## 2011-02-20 NOTE — Assessment & Plan Note (Signed)
The patient has very mild aortic stenosis by echo in December, 2012. No further workup is needed.

## 2011-02-20 NOTE — Assessment & Plan Note (Signed)
Coronary disease is stable. We know from her study in December, 2012 that her stents are patent. There is a small to moderate 90% ostial diagonal 1 lesion. This could possibly cause some symptoms but it is treated medically. No further workup.

## 2011-02-20 NOTE — Assessment & Plan Note (Signed)
Patient had been given a prescription for Lipitor. She says she cannot tolerate this. She says she can tolerate Crestor. This will be started if she is in agreement.

## 2011-03-07 DIAGNOSIS — R609 Edema, unspecified: Secondary | ICD-10-CM | POA: Diagnosis not present

## 2011-03-07 DIAGNOSIS — I10 Essential (primary) hypertension: Secondary | ICD-10-CM | POA: Diagnosis not present

## 2011-03-07 DIAGNOSIS — K219 Gastro-esophageal reflux disease without esophagitis: Secondary | ICD-10-CM | POA: Diagnosis not present

## 2011-03-07 DIAGNOSIS — M549 Dorsalgia, unspecified: Secondary | ICD-10-CM | POA: Diagnosis not present

## 2011-03-21 DIAGNOSIS — Z79899 Other long term (current) drug therapy: Secondary | ICD-10-CM | POA: Diagnosis not present

## 2011-03-21 DIAGNOSIS — G8929 Other chronic pain: Secondary | ICD-10-CM | POA: Diagnosis present

## 2011-03-21 DIAGNOSIS — Z7982 Long term (current) use of aspirin: Secondary | ICD-10-CM | POA: Diagnosis not present

## 2011-03-21 DIAGNOSIS — I10 Essential (primary) hypertension: Secondary | ICD-10-CM | POA: Diagnosis not present

## 2011-03-21 DIAGNOSIS — Z780 Asymptomatic menopausal state: Secondary | ICD-10-CM | POA: Diagnosis not present

## 2011-03-21 DIAGNOSIS — M545 Low back pain: Secondary | ICD-10-CM | POA: Diagnosis present

## 2011-03-21 DIAGNOSIS — Z886 Allergy status to analgesic agent status: Secondary | ICD-10-CM | POA: Diagnosis not present

## 2011-03-21 DIAGNOSIS — N12 Tubulo-interstitial nephritis, not specified as acute or chronic: Secondary | ICD-10-CM | POA: Diagnosis not present

## 2011-03-21 DIAGNOSIS — Z888 Allergy status to other drugs, medicaments and biological substances status: Secondary | ICD-10-CM | POA: Diagnosis not present

## 2011-03-21 DIAGNOSIS — Z794 Long term (current) use of insulin: Secondary | ICD-10-CM | POA: Diagnosis not present

## 2011-03-21 DIAGNOSIS — E119 Type 2 diabetes mellitus without complications: Secondary | ICD-10-CM | POA: Diagnosis not present

## 2011-03-21 DIAGNOSIS — K449 Diaphragmatic hernia without obstruction or gangrene: Secondary | ICD-10-CM | POA: Diagnosis present

## 2011-03-21 DIAGNOSIS — Z882 Allergy status to sulfonamides status: Secondary | ICD-10-CM | POA: Diagnosis not present

## 2011-03-21 DIAGNOSIS — F319 Bipolar disorder, unspecified: Secondary | ICD-10-CM | POA: Diagnosis not present

## 2011-03-21 DIAGNOSIS — Z7902 Long term (current) use of antithrombotics/antiplatelets: Secondary | ICD-10-CM | POA: Diagnosis not present

## 2011-03-21 DIAGNOSIS — I2510 Atherosclerotic heart disease of native coronary artery without angina pectoris: Secondary | ICD-10-CM | POA: Diagnosis not present

## 2011-04-08 DIAGNOSIS — A4150 Gram-negative sepsis, unspecified: Secondary | ICD-10-CM | POA: Diagnosis not present

## 2011-04-08 DIAGNOSIS — K219 Gastro-esophageal reflux disease without esophagitis: Secondary | ICD-10-CM | POA: Diagnosis not present

## 2011-04-08 DIAGNOSIS — I10 Essential (primary) hypertension: Secondary | ICD-10-CM | POA: Diagnosis not present

## 2011-04-08 DIAGNOSIS — M549 Dorsalgia, unspecified: Secondary | ICD-10-CM | POA: Diagnosis not present

## 2011-04-08 DIAGNOSIS — N3000 Acute cystitis without hematuria: Secondary | ICD-10-CM | POA: Diagnosis not present

## 2011-04-08 DIAGNOSIS — R609 Edema, unspecified: Secondary | ICD-10-CM | POA: Diagnosis not present

## 2011-04-23 DIAGNOSIS — E119 Type 2 diabetes mellitus without complications: Secondary | ICD-10-CM | POA: Diagnosis not present

## 2011-04-23 DIAGNOSIS — H2589 Other age-related cataract: Secondary | ICD-10-CM | POA: Diagnosis not present

## 2011-04-23 DIAGNOSIS — H4011X0 Primary open-angle glaucoma, stage unspecified: Secondary | ICD-10-CM | POA: Diagnosis not present

## 2011-04-23 DIAGNOSIS — H40039 Anatomical narrow angle, unspecified eye: Secondary | ICD-10-CM | POA: Diagnosis not present

## 2011-05-16 DIAGNOSIS — Z1211 Encounter for screening for malignant neoplasm of colon: Secondary | ICD-10-CM | POA: Diagnosis not present

## 2011-05-16 DIAGNOSIS — K5900 Constipation, unspecified: Secondary | ICD-10-CM | POA: Diagnosis not present

## 2011-05-16 DIAGNOSIS — K863 Pseudocyst of pancreas: Secondary | ICD-10-CM | POA: Diagnosis not present

## 2011-05-16 DIAGNOSIS — K862 Cyst of pancreas: Secondary | ICD-10-CM | POA: Diagnosis not present

## 2011-05-21 DIAGNOSIS — H40039 Anatomical narrow angle, unspecified eye: Secondary | ICD-10-CM | POA: Diagnosis not present

## 2011-05-22 DIAGNOSIS — M47817 Spondylosis without myelopathy or radiculopathy, lumbosacral region: Secondary | ICD-10-CM | POA: Diagnosis not present

## 2011-05-26 ENCOUNTER — Other Ambulatory Visit: Payer: Self-pay | Admitting: Nurse Practitioner

## 2011-06-04 DIAGNOSIS — E782 Mixed hyperlipidemia: Secondary | ICD-10-CM | POA: Diagnosis not present

## 2011-06-13 DIAGNOSIS — L02419 Cutaneous abscess of limb, unspecified: Secondary | ICD-10-CM | POA: Diagnosis not present

## 2011-06-13 DIAGNOSIS — L03119 Cellulitis of unspecified part of limb: Secondary | ICD-10-CM | POA: Diagnosis not present

## 2011-06-18 DIAGNOSIS — IMO0002 Reserved for concepts with insufficient information to code with codable children: Secondary | ICD-10-CM | POA: Diagnosis not present

## 2011-06-18 DIAGNOSIS — M353 Polymyalgia rheumatica: Secondary | ICD-10-CM | POA: Diagnosis not present

## 2011-06-23 ENCOUNTER — Other Ambulatory Visit: Payer: Self-pay | Admitting: Physician Assistant

## 2011-06-24 ENCOUNTER — Other Ambulatory Visit: Payer: Self-pay | Admitting: Physician Assistant

## 2011-06-25 DIAGNOSIS — J209 Acute bronchitis, unspecified: Secondary | ICD-10-CM | POA: Diagnosis not present

## 2011-07-26 ENCOUNTER — Other Ambulatory Visit: Payer: Self-pay | Admitting: Physician Assistant

## 2011-07-31 DIAGNOSIS — E1149 Type 2 diabetes mellitus with other diabetic neurological complication: Secondary | ICD-10-CM | POA: Diagnosis not present

## 2011-07-31 DIAGNOSIS — M79609 Pain in unspecified limb: Secondary | ICD-10-CM | POA: Diagnosis not present

## 2011-07-31 DIAGNOSIS — B351 Tinea unguium: Secondary | ICD-10-CM | POA: Diagnosis not present

## 2011-08-03 ENCOUNTER — Encounter: Payer: Self-pay | Admitting: Cardiology

## 2011-08-06 ENCOUNTER — Ambulatory Visit (INDEPENDENT_AMBULATORY_CARE_PROVIDER_SITE_OTHER): Payer: Medicare Other | Admitting: Cardiology

## 2011-08-06 ENCOUNTER — Encounter: Payer: Self-pay | Admitting: Cardiology

## 2011-08-06 VITALS — BP 134/65 | HR 48 | Ht 63.5 in | Wt 150.8 lb

## 2011-08-06 DIAGNOSIS — R0602 Shortness of breath: Secondary | ICD-10-CM | POA: Diagnosis not present

## 2011-08-06 DIAGNOSIS — I10 Essential (primary) hypertension: Secondary | ICD-10-CM | POA: Diagnosis not present

## 2011-08-06 DIAGNOSIS — R0989 Other specified symptoms and signs involving the circulatory and respiratory systems: Secondary | ICD-10-CM

## 2011-08-06 DIAGNOSIS — I2510 Atherosclerotic heart disease of native coronary artery without angina pectoris: Secondary | ICD-10-CM | POA: Diagnosis not present

## 2011-08-06 NOTE — Assessment & Plan Note (Addendum)
Patient is complaining of some shortness of breath. She does not appear to be significantly fluid overloaded at this time. Her weight is down because of better eating habits. She may have trace edema. She is on a small dose of Lasix. I'm very hesitant to push her diuretic dose any higher. She does have significant salt intake. She says that she can cut this back. I have encouraged her to do so as the first step. Her room air O2 sat today is 98%.

## 2011-08-06 NOTE — Assessment & Plan Note (Signed)
Blood pressure stable. No change in therapy. 

## 2011-08-06 NOTE — Patient Instructions (Addendum)
Your physician recommends that you schedule a follow-up appointment in: 6 months with Dr. Katz. You will receive a reminder letter in the mail in about 4 months reminding you to call and schedule your appointment. If you don't receive this letter, please contact our office.   Your physician recommends that you continue on your current medications as directed. Please refer to the Current Medication list given to you today.  

## 2011-08-06 NOTE — Assessment & Plan Note (Signed)
This problem is stable. She needs no further workup at this time.

## 2011-08-06 NOTE — Progress Notes (Signed)
Patient ID: Sharon Hubbard, female   DOB: 1934/01/06, 78 y.o.   MRN: [id number]   HPI   Patient is seen to followup coronary artery disease. I saw her last in January, 2013. She had a repeat catheterization in December, 2012. Her stent was patent. She then had some right groin discomfort in January 2 013. There was a soft bruit was question of a pseudoaneurysm. She was actually transferred to [facility]. We saw her there and she was evaluated by vascular surgery. Repeat study revealed no significant pseudoaneurysm. I then saw her back in the office in January, 2013. She was stable. She has good left ventricular function. She has mild aortic stenosis. She says that she has exertional shortness of breath. She does not have any PND or orthopnea. There is intermittent trace edema. She is on a small dose of Lasix. She definitely has not gained weight. As a matter of fact she has lost weight related to better dietary habits.  Allergies  Allergen Reactions  . Clarithromycin     REACTION: Vomiting and diarrhea.  . Codeine   . Sulfa Antibiotics   . Atorvastatin Other (See Comments)    Made her knees buckle and muscles ache    Current Outpatient Prescriptions  Medication Sig Dispense Refill  . amLODipine (NORVASC) 10 MG tablet Take 10 mg by mouth daily.        Marland Kitchen aspirin 81 MG tablet Take 81 mg by mouth daily.        . clopidogrel (PLAVIX) 75 MG tablet TAKE ONE TABLET BY MOUTH EVERY DAY IN THE MORNING  30 tablet  6  . divalproex (DEPAKOTE) 500 MG 24 hr tablet Take 500 mg by mouth daily.        . fish oil-omega-3 fatty acids 1000 MG capsule Take 1 g by mouth 2 (two) times daily.        . furosemide (LASIX) 20 MG tablet Take 20 mg by mouth daily.        Marland Kitchen glimepiride (AMARYL) 4 MG tablet Take 4 mg by mouth 2 (two) times daily.        Marland Kitchen HYDROcodone-acetaminophen (VICODIN) 5-500 MG per tablet Take 1 tablet by mouth every 8 (eight) hours as needed. For pain       . insulin detemir (LEVEMIR) 100 UNIT/ML  injection Inject 18 Units into the skin daily.      . insulin lispro (HUMALOG) 100 UNIT/ML injection Inject into the skin 3 (three) times daily before meals. Pt is on a sliding scale      . isosorbide mononitrate (IMDUR) 60 MG 24 hr tablet TAKE ONE TABLET BY MOUTH EVERY DAY  30 tablet  5  . latanoprost (XALATAN) 0.005 % ophthalmic solution Place 1 drop into both eyes at bedtime.        Marland Kitchen lisinopril (PRINIVIL,ZESTRIL) 20 MG tablet Take 20 mg by mouth daily.        . Melatonin 3 MG TABS Take 1 tablet by mouth at bedtime.      . metoprolol tartrate (LOPRESSOR) 25 MG tablet TAKE ONE TABLET BY MOUTH TWICE DAILY  60 tablet  6  . nitroGLYCERIN (NITROSTAT) 0.4 MG SL tablet Place 1 tablet (0.4 mg total) under the tongue every 5 (five) minutes as needed for chest pain.  25 tablet  11  . pantoprazole (PROTONIX) 40 MG tablet Take 40 mg by mouth daily.        . potassium chloride SA (K-DUR,KLOR-CON) 20 MEQ tablet Take 1  tablet (20 mEq total) by mouth daily.  30 tablet  5  . predniSONE (DELTASONE) 1 MG tablet Take 3 mg by mouth every morning.        . rosuvastatin (CRESTOR) 20 MG tablet Take 1 tablet (20 mg total) by mouth at bedtime.  30 tablet  6  . timolol (TIMOPTIC) 0.5 % ophthalmic solution Place 1 drop into both eyes daily.         History   Social History  . Marital Status: Married    Spouse Name: N/A    Number of Children: N/A  . Years of Education: N/A   Occupational History  . Not on file.   Social History Main Topics  . Smoking status: Never Smoker   . Smokeless tobacco: Never Used  . Alcohol Use: No  . Drug Use: No  . Sexually Active: No   Other Topics Concern  . Not on file   Social History Narrative  . No narrative on file    No family history on file.  Past Medical History  Diagnosis Date  . CAD (coronary artery disease)     Interventions in the past  . Groin hematoma     April, 2011  . Hypertension   . Diabetes mellitus   . Dyslipidemia   . GERD (gastroesophageal  reflux disease)   . Schatzki's ring   . Renal insufficiency   . Polymyalgia rheumatica   . Osteoarthritis     arthroscopic surgery right knee February, 2012  . Spinal stenosis     history of surgery for this  . Ejection fraction     EF 65%, echo, April, 2012, aortic valve sclerosis with very slight gradient  . Preoperative evaluation to rule out surgical contraindication     Patient needs back surgery by Dr. Channing Mutters, May, 2012                . Pancreas cyst     The patient has multiple pancreatic cysts.  This is followed at [facility]         . Complication of anesthesia     "felt like I was smothering once with mask on my face"  . RBBB (right bundle branch block)     old  . Bradycardia, sinus   . Myocardial infarction 2011; 08/2010  . Shortness of breath on exertion   . Blood transfusion   . Anemia   . Stroke 01/18/11    "I've had 2 TIA's"  . Cancer     ""had hysterectomy for a 6 showing cancer; think that's pretty strong  . Aortic stenosis     very mild, echo 12/2010  . Femoral bruit     01/2011 hosp, but no pseudoaneurysm or AV fistula    Past Surgical History  Procedure Date  . Coronary angioplasty with stent placement 2011    "in [place]"  . Coronary angioplasty with stent placement 08/2010  . Cardiac catheterization 01/18/11  . Cholecystectomy 1987  . Appendectomy   . Back surgery   . Lumbar spine surgery ~ 1977; ~ 2010    "2 hugh ruptured discs; I was paralyzed first OR; 2nd OR by Dr. Channing Mutters, don't know what for"  . Tubal ligation 1970's  . Dilation and curettage of uterus   . Abdominal hysterectomy ~ 1974  . Knee arthroscopy 02/2010    right knee; chrondoplasty medial femoral condyle;  Partial medial meniscectomy.     ROS   Patient denies fever, chills, headache, sweats, rash,  Change in vision, change in hearing, chest pain, cough, nausea vomiting, urinary symptoms. All other systems are reviewed and are negative.  PHYSICAL EXAM   She is talkative today about  significant back pain. She's not having any chest pain. She is oriented to person time and place. Affect is normal. Lungs are clear. There is no jugulovenous distention. There is no respiratory distress. Cardiac exam reveals S1 and S2. There no clicks or significant murmurs. Abdomen is soft. There is trace peripheral edema.  Filed Vitals:   08/06/11 1336  BP: 134/65  Pulse: 48  Height: 5' 3.5" (1.613 m)  Weight: 150 lb 12.8 oz (68.402 kg)  SpO2: 92%     ASSESSMENT & PLAN

## 2011-08-06 NOTE — Assessment & Plan Note (Signed)
Coronary disease is stable. No further workup needed. 

## 2011-08-14 DIAGNOSIS — H4011X0 Primary open-angle glaucoma, stage unspecified: Secondary | ICD-10-CM | POA: Diagnosis not present

## 2011-08-14 DIAGNOSIS — H2589 Other age-related cataract: Secondary | ICD-10-CM | POA: Diagnosis not present

## 2011-08-14 DIAGNOSIS — E119 Type 2 diabetes mellitus without complications: Secondary | ICD-10-CM | POA: Diagnosis not present

## 2011-08-22 DIAGNOSIS — M47817 Spondylosis without myelopathy or radiculopathy, lumbosacral region: Secondary | ICD-10-CM | POA: Diagnosis not present

## 2011-09-09 DIAGNOSIS — N3000 Acute cystitis without hematuria: Secondary | ICD-10-CM | POA: Diagnosis not present

## 2011-09-09 DIAGNOSIS — E782 Mixed hyperlipidemia: Secondary | ICD-10-CM | POA: Diagnosis not present

## 2011-09-10 DIAGNOSIS — H2589 Other age-related cataract: Secondary | ICD-10-CM | POA: Diagnosis not present

## 2011-09-20 DIAGNOSIS — Z961 Presence of intraocular lens: Secondary | ICD-10-CM | POA: Diagnosis not present

## 2011-09-20 DIAGNOSIS — Z8673 Personal history of transient ischemic attack (TIA), and cerebral infarction without residual deficits: Secondary | ICD-10-CM | POA: Diagnosis not present

## 2011-09-20 DIAGNOSIS — H4011X0 Primary open-angle glaucoma, stage unspecified: Secondary | ICD-10-CM | POA: Diagnosis not present

## 2011-09-20 DIAGNOSIS — I10 Essential (primary) hypertension: Secondary | ICD-10-CM | POA: Diagnosis not present

## 2011-09-20 DIAGNOSIS — H409 Unspecified glaucoma: Secondary | ICD-10-CM | POA: Diagnosis not present

## 2011-09-20 DIAGNOSIS — I2510 Atherosclerotic heart disease of native coronary artery without angina pectoris: Secondary | ICD-10-CM | POA: Diagnosis not present

## 2011-09-20 DIAGNOSIS — H2589 Other age-related cataract: Secondary | ICD-10-CM | POA: Diagnosis not present

## 2011-09-20 DIAGNOSIS — H269 Unspecified cataract: Secondary | ICD-10-CM | POA: Diagnosis not present

## 2011-09-20 DIAGNOSIS — E119 Type 2 diabetes mellitus without complications: Secondary | ICD-10-CM | POA: Diagnosis not present

## 2011-09-20 DIAGNOSIS — G40909 Epilepsy, unspecified, not intractable, without status epilepticus: Secondary | ICD-10-CM | POA: Diagnosis not present

## 2011-09-20 DIAGNOSIS — H40009 Preglaucoma, unspecified, unspecified eye: Secondary | ICD-10-CM | POA: Diagnosis not present

## 2011-09-20 DIAGNOSIS — Z9849 Cataract extraction status, unspecified eye: Secondary | ICD-10-CM | POA: Diagnosis not present

## 2011-10-09 DIAGNOSIS — E1149 Type 2 diabetes mellitus with other diabetic neurological complication: Secondary | ICD-10-CM | POA: Diagnosis not present

## 2011-10-09 DIAGNOSIS — E119 Type 2 diabetes mellitus without complications: Secondary | ICD-10-CM | POA: Diagnosis not present

## 2011-10-30 ENCOUNTER — Other Ambulatory Visit: Payer: Self-pay | Admitting: *Deleted

## 2011-10-30 ENCOUNTER — Other Ambulatory Visit: Payer: Self-pay | Admitting: Cardiology

## 2011-10-30 MED ORDER — POTASSIUM CHLORIDE CRYS ER 20 MEQ PO TBCR: 20.0000 meq | EXTENDED_RELEASE_TABLET | Freq: Every day | ORAL | Status: DC

## 2011-11-28 ENCOUNTER — Other Ambulatory Visit: Payer: Self-pay | Admitting: Cardiology

## 2011-11-28 MED ORDER — METOPROLOL TARTRATE 25 MG PO TABS: 25.0000 mg | ORAL_TABLET | Freq: Two times a day (BID) | ORAL | Status: DC

## 2011-11-28 NOTE — Telephone Encounter (Signed)
Generic lopressor 25mg  reifll needed

## 2011-12-11 DIAGNOSIS — M47817 Spondylosis without myelopathy or radiculopathy, lumbosacral region: Secondary | ICD-10-CM | POA: Diagnosis not present

## 2011-12-12 DIAGNOSIS — E782 Mixed hyperlipidemia: Secondary | ICD-10-CM | POA: Diagnosis not present

## 2011-12-12 DIAGNOSIS — I10 Essential (primary) hypertension: Secondary | ICD-10-CM | POA: Diagnosis not present

## 2011-12-23 DIAGNOSIS — Z23 Encounter for immunization: Secondary | ICD-10-CM | POA: Diagnosis not present

## 2011-12-23 DIAGNOSIS — IMO0002 Reserved for concepts with insufficient information to code with codable children: Secondary | ICD-10-CM | POA: Diagnosis not present

## 2011-12-23 DIAGNOSIS — M899 Disorder of bone, unspecified: Secondary | ICD-10-CM | POA: Diagnosis not present

## 2011-12-23 DIAGNOSIS — M353 Polymyalgia rheumatica: Secondary | ICD-10-CM | POA: Diagnosis not present

## 2011-12-31 DIAGNOSIS — E1149 Type 2 diabetes mellitus with other diabetic neurological complication: Secondary | ICD-10-CM | POA: Diagnosis not present

## 2011-12-31 DIAGNOSIS — E119 Type 2 diabetes mellitus without complications: Secondary | ICD-10-CM | POA: Diagnosis not present

## 2012-02-17 ENCOUNTER — Encounter: Payer: Self-pay | Admitting: Cardiology

## 2012-02-17 ENCOUNTER — Ambulatory Visit (INDEPENDENT_AMBULATORY_CARE_PROVIDER_SITE_OTHER): Payer: Medicare Other | Admitting: Cardiology

## 2012-02-17 VITALS — BP 137/71 | HR 41 | Ht 63.0 in | Wt 150.4 lb

## 2012-02-17 DIAGNOSIS — I4510 Unspecified right bundle-branch block: Secondary | ICD-10-CM

## 2012-02-17 DIAGNOSIS — I350 Nonrheumatic aortic (valve) stenosis: Secondary | ICD-10-CM

## 2012-02-17 DIAGNOSIS — I10 Essential (primary) hypertension: Secondary | ICD-10-CM

## 2012-02-17 DIAGNOSIS — I359 Nonrheumatic aortic valve disorder, unspecified: Secondary | ICD-10-CM

## 2012-02-17 DIAGNOSIS — I498 Other specified cardiac arrhythmias: Secondary | ICD-10-CM

## 2012-02-17 DIAGNOSIS — R001 Bradycardia, unspecified: Secondary | ICD-10-CM

## 2012-02-17 DIAGNOSIS — I2510 Atherosclerotic heart disease of native coronary artery without angina pectoris: Secondary | ICD-10-CM

## 2012-02-17 DIAGNOSIS — N289 Disorder of kidney and ureter, unspecified: Secondary | ICD-10-CM

## 2012-02-17 NOTE — Assessment & Plan Note (Signed)
Blood pressures control. No change in therapy. 

## 2012-02-17 NOTE — Assessment & Plan Note (Signed)
We noted the patient has very mild aortic stenosis by echo, December, 2012. She does not need a followup echo now.

## 2012-02-17 NOTE — Assessment & Plan Note (Signed)
Chemistry will be checked to be sure that there is no electrolyte basis for her bradycardia.

## 2012-02-17 NOTE — Patient Instructions (Signed)
   Stop Metoprolol  Labs today for TSH, BMET  Office will notify of results Continue all other current medications.  Follow up on 2/7

## 2012-02-17 NOTE — Assessment & Plan Note (Signed)
Right bundle-branch block is old.

## 2012-02-17 NOTE — Progress Notes (Signed)
HPI  Patient is seen to followup coronary disease and bradycardia. The patient had a catheterization that was a repeat study in December, 2012. There was question of a pseudoaneurysm that turned out not to be the case. However the patient was concerned and had been briefly transferred to [facility] for this evaluation. She stabilized and she is doing well. I saw her back in the office July, 2013.  More recently she says that she feels weak after doing certain activities for a prolonged period of time. She also says that on one days she may have felt presyncopal. There is a history of bradycardia. She is on a low dose of metoprolol. It appears that her bradycardia now is worse.  Allergies  Allergen Reactions  . Clarithromycin     REACTION: Vomiting and diarrhea.  . Codeine   . Sulfa Antibiotics   . Atorvastatin Other (See Comments)    Made her knees buckle and muscles ache    Current Outpatient Prescriptions  Medication Sig Dispense Refill  . amLODipine (NORVASC) 10 MG tablet Take 10 mg by mouth daily.        Marland Kitchen aspirin 81 MG tablet Take 81 mg by mouth daily.        . clopidogrel (PLAVIX) 75 MG tablet TAKE ONE TABLET BY MOUTH EVERY DAY IN THE MORNING  30 tablet  6  . divalproex (DEPAKOTE) 500 MG 24 hr tablet Take 500 mg by mouth daily.        . fish oil-omega-3 fatty acids 1000 MG capsule Take 1 g by mouth 2 (two) times daily.        . furosemide (LASIX) 20 MG tablet Take 20 mg by mouth daily.        Marland Kitchen glimepiride (AMARYL) 4 MG tablet Take 4 mg by mouth 2 (two) times daily.        Marland Kitchen HYDROcodone-acetaminophen (VICODIN) 5-500 MG per tablet Take 1 tablet by mouth every 8 (eight) hours as needed. For pain       . insulin detemir (LEVEMIR) 100 UNIT/ML injection Inject 18 Units into the skin daily.      . insulin lispro (HUMALOG) 100 UNIT/ML injection Inject into the skin 3 (three) times daily before meals. Pt is on a sliding scale      . isosorbide mononitrate (IMDUR) 60 MG 24 hr tablet TAKE  ONE TABLET BY MOUTH EVERY DAY  30 tablet  5  . latanoprost (XALATAN) 0.005 % ophthalmic solution Place 1 drop into both eyes at bedtime.        Marland Kitchen lisinopril (PRINIVIL,ZESTRIL) 20 MG tablet Take 20 mg by mouth daily.        . Melatonin 3 MG TABS Take 1 tablet by mouth at bedtime.      . metoprolol tartrate (LOPRESSOR) 25 MG tablet Take 25 mg by mouth daily.      . nitroGLYCERIN (NITROSTAT) 0.4 MG SL tablet Place 1 tablet (0.4 mg total) under the tongue every 5 (five) minutes as needed for chest pain.  25 tablet  11  . pantoprazole (PROTONIX) 40 MG tablet Take 40 mg by mouth daily.        . potassium chloride SA (K-DUR,KLOR-CON) 20 MEQ tablet Take 1 tablet (20 mEq total) by mouth daily.  30 tablet  5  . predniSONE (DELTASONE) 1 MG tablet Take 3 mg by mouth every morning.        . rosuvastatin (CRESTOR) 20 MG tablet Take 1 tablet (20 mg total) by  mouth at bedtime.  30 tablet  6  . timolol (TIMOPTIC) 0.5 % ophthalmic solution Place 1 drop into both eyes daily.         History   Social History  . Marital Status: Married    Spouse Name: N/A    Number of Children: N/A  . Years of Education: N/A   Occupational History  . Not on file.   Social History Main Topics  . Smoking status: Never Smoker   . Smokeless tobacco: Never Used  . Alcohol Use: No  . Drug Use: No  . Sexually Active: No   Other Topics Concern  . Not on file   Social History Narrative  . No narrative on file    No family history on file.  Past Medical History  Diagnosis Date  . CAD (coronary artery disease)     Interventions in the past  . Groin hematoma     April, 2011  . Hypertension   . Diabetes mellitus   . Dyslipidemia   . GERD (gastroesophageal reflux disease)   . Schatzki's ring   . Renal insufficiency   . Polymyalgia rheumatica   . Osteoarthritis     arthroscopic surgery right knee February, 2012  . Spinal stenosis     history of surgery for this  . Ejection fraction     EF 65%, echo, April,  2012, aortic valve sclerosis with very slight gradient  . Preoperative evaluation to rule out surgical contraindication     Patient needs back surgery by Dr. Channing Mutters, May, 2012                . Pancreas cyst     The patient has multiple pancreatic cysts.  This is followed at [facility]         . Complication of anesthesia     "felt like I was smothering once with mask on my face"  . RBBB (right bundle branch block)     old  . Bradycardia, sinus   . Myocardial infarction 2011; 08/2010  . Shortness of breath on exertion   . Blood transfusion   . Anemia   . Stroke 01/18/11    "I've had 2 TIA's"  . Cancer     ""had hysterectomy for a 6 showing cancer; think that's pretty strong  . Aortic stenosis     very mild, echo 12/2010  . Femoral bruit     01/2011 hosp, but no pseudoaneurysm or AV fistula  . Pre-syncope     Hospitalization January 18, 2012, associated with rapid supraventricular tachycardia  . SVT (supraventricular tachycardia)     Hospital December, 2013, broke with adenosine,,  associated with presyncope,  possible AVNRT,     Past Surgical History  Procedure Date  . Coronary angioplasty with stent placement 2011    "in [place]"  . Coronary angioplasty with stent placement 08/2010  . Cardiac catheterization 01/18/11  . Cholecystectomy 1987  . Appendectomy   . Back surgery   . Lumbar spine surgery ~ 1977; ~ 2010    "2 hugh ruptured discs; I was paralyzed first OR; 2nd OR by Dr. Channing Mutters, don't know what for"  . Tubal ligation 1970's  . Dilation and curettage of uterus   . Abdominal hysterectomy ~ 1974  . Knee arthroscopy 02/2010    right knee; chrondoplasty medial femoral condyle;  Partial medial meniscectomy.     Patient Active Problem List  Diagnosis  . CAD (coronary artery disease)  . RBBB (right bundle  branch block)  . Groin hematoma  . Hypertension  . Diabetes mellitus  . Dyslipidemia  . GERD (gastroesophageal reflux disease)  . Renal insufficiency  . Polymyalgia  rheumatica  . Bradycardia  . Ejection fraction  . Preoperative evaluation to rule out surgical contraindication  . Pancreas cyst  . Aortic stenosis  . Femoral bruit  . Shortness of breath on exertion  . Pre-syncope  . SVT (supraventricular tachycardia)    ROS   Patient denies fever, chills, headache, sweats, rash, change in vision, change in hearing, chest pain, cough, nausea vomiting, urinary symptoms. All other systems are reviewed and are negative.  PHYSICAL EXAM  Patient is oriented to person time and place. Affect is normal. There is no jugulovenous distention. Lungs are clear. Respiratory effort is nonlabored. Cardiac exam reveals S1 and S2. There is a murmur of mild aortic stenosis.. The abdomen is soft. There is no peripheral edema.  Filed Vitals:   02/17/12 1305  BP: 137/71  Pulse: 41  Height: 5\' 3"  (1.6 m)  Weight: 150 lb 6.4 oz (68.221 kg)   EKG is done today and reviewed by me. There is old right bundle branch block. There is marked sinus bradycardia.  ASSESSMENT & PLAN

## 2012-02-17 NOTE — Assessment & Plan Note (Signed)
Coronary disease is stable. We know her anatomy from catheterization December, 2012. Medical therapy is recommended.

## 2012-02-17 NOTE — Assessment & Plan Note (Signed)
It appears that the patient's sinus bradycardia is now more marked. Metoprolol will be stopped. She is on some Timoptic eyedrops that we'll have to be kept in mind. I believe this may be causing some of her symptoms. I will see her for early followup.

## 2012-02-17 NOTE — Addendum Note (Signed)
Addended by: Lesle Chris on: 02/17/2012 01:50 PM   Modules accepted: Orders

## 2012-02-18 DIAGNOSIS — N289 Disorder of kidney and ureter, unspecified: Secondary | ICD-10-CM | POA: Diagnosis not present

## 2012-02-18 DIAGNOSIS — I2510 Atherosclerotic heart disease of native coronary artery without angina pectoris: Secondary | ICD-10-CM | POA: Diagnosis not present

## 2012-02-18 DIAGNOSIS — I498 Other specified cardiac arrhythmias: Secondary | ICD-10-CM | POA: Diagnosis not present

## 2012-02-25 ENCOUNTER — Telehealth: Payer: Self-pay | Admitting: *Deleted

## 2012-02-25 NOTE — Telephone Encounter (Signed)
Message copied by Eustace Moore on Tue Feb 25, 2012 10:27 AM ------      Message from: Myrtis Ser, [place] D      Created: Fri Feb 21, 2012  5:45 PM       Ask her to take an extra potassium pill for two days.

## 2012-02-25 NOTE — Telephone Encounter (Signed)
Patient informed and verbalized understanding of plan. 

## 2012-02-25 NOTE — Telephone Encounter (Signed)
Left message for patient to call office.  

## 2012-02-27 ENCOUNTER — Other Ambulatory Visit: Payer: Self-pay

## 2012-02-27 MED ORDER — ISOSORBIDE MONONITRATE ER 60 MG PO TB24: 60.0000 mg | ORAL_TABLET | Freq: Every day | ORAL | Status: DC

## 2012-02-28 ENCOUNTER — Encounter: Payer: Self-pay | Admitting: Cardiology

## 2012-02-28 ENCOUNTER — Ambulatory Visit (INDEPENDENT_AMBULATORY_CARE_PROVIDER_SITE_OTHER): Payer: Medicare Other | Admitting: Cardiology

## 2012-02-28 VITALS — BP 147/77 | HR 52 | Ht 63.0 in | Wt 149.0 lb

## 2012-02-28 DIAGNOSIS — I10 Essential (primary) hypertension: Secondary | ICD-10-CM

## 2012-02-28 DIAGNOSIS — I498 Other specified cardiac arrhythmias: Secondary | ICD-10-CM

## 2012-02-28 DIAGNOSIS — N289 Disorder of kidney and ureter, unspecified: Secondary | ICD-10-CM | POA: Diagnosis not present

## 2012-02-28 DIAGNOSIS — R001 Bradycardia, unspecified: Secondary | ICD-10-CM

## 2012-02-28 MED ORDER — ISOSORBIDE MONONITRATE ER 60 MG PO TB24: 60.0000 mg | ORAL_TABLET | Freq: Every day | ORAL | Status: DC

## 2012-02-28 MED ORDER — POTASSIUM CHLORIDE CRYS ER 20 MEQ PO TBCR: 20.0000 meq | EXTENDED_RELEASE_TABLET | Freq: Every day | ORAL | Status: DC

## 2012-02-28 MED ORDER — CLOPIDOGREL BISULFATE 75 MG PO TABS: 75.0000 mg | ORAL_TABLET | Freq: Every day | ORAL | Status: DC

## 2012-02-28 NOTE — Progress Notes (Signed)
HPI  The patient is seen today to followup bradycardia, hypertension. I saw her on February 17, 2012. She had mentioned one episode of possibly feeling presyncopal. She was bradycardic and I stopped her beta blocker. She's feeling well. She has not had any recurring symptoms. We did check her labs. Creatinine was 1.39 with a GFR of 45. Potassium was 3.4. She was already on potassium and I gave a small extra dosing for 2 days. TSH was normal.  She returns today and she is doing well.  Allergies  Allergen Reactions  . Clarithromycin     REACTION: Vomiting and diarrhea.  . Codeine   . Sulfa Antibiotics   . Atorvastatin Other (See Comments)    Made her knees buckle and muscles ache    Current Outpatient Prescriptions  Medication Sig Dispense Refill  . amLODipine (NORVASC) 10 MG tablet Take 10 mg by mouth daily.        Marland Kitchen aspirin 81 MG tablet Take 81 mg by mouth daily.        . clopidogrel (PLAVIX) 75 MG tablet Take 1 tablet (75 mg total) by mouth daily.  90 tablet  3  . divalproex (DEPAKOTE) 500 MG 24 hr tablet Take 500 mg by mouth daily.        . fish oil-omega-3 fatty acids 1000 MG capsule Take 1 g by mouth 2 (two) times daily.        . furosemide (LASIX) 20 MG tablet Take 20 mg by mouth daily.        Marland Kitchen glimepiride (AMARYL) 4 MG tablet Take 4 mg by mouth 2 (two) times daily.        Marland Kitchen HYDROcodone-acetaminophen (VICODIN) 5-500 MG per tablet Take 1 tablet by mouth every 8 (eight) hours as needed. For pain       . insulin detemir (LEVEMIR) 100 UNIT/ML injection Inject 18 Units into the skin daily.      . insulin lispro (HUMALOG) 100 UNIT/ML injection Inject into the skin 3 (three) times daily before meals. Pt is on a sliding scale      . isosorbide mononitrate (IMDUR) 60 MG 24 hr tablet Take 1 tablet (60 mg total) by mouth daily.  90 tablet  3  . latanoprost (XALATAN) 0.005 % ophthalmic solution Place 1 drop into both eyes at bedtime.        Marland Kitchen lisinopril (PRINIVIL,ZESTRIL) 20 MG tablet Take  20 mg by mouth daily.        . Melatonin 3 MG TABS Take 1 tablet by mouth at bedtime.      . nitroGLYCERIN (NITROSTAT) 0.4 MG SL tablet Place 1 tablet (0.4 mg total) under the tongue every 5 (five) minutes as needed for chest pain.  25 tablet  11  . pantoprazole (PROTONIX) 40 MG tablet Take 40 mg by mouth daily.        . potassium chloride SA (K-DUR,KLOR-CON) 20 MEQ tablet Take 1 tablet (20 mEq total) by mouth daily.  90 tablet  3  . predniSONE (DELTASONE) 1 MG tablet Take 3 mg by mouth every morning.        . rosuvastatin (CRESTOR) 20 MG tablet Take 20 mg by mouth at bedtime.      . timolol (TIMOPTIC) 0.5 % ophthalmic solution Place 1 drop into both eyes daily.         History   Social History  . Marital Status: Married    Spouse Name: N/A    Number of Children: N/A  .  Years of Education: N/A   Occupational History  . Not on file.   Social History Main Topics  . Smoking status: Never Smoker   . Smokeless tobacco: Never Used  . Alcohol Use: No  . Drug Use: No  . Sexually Active: No   Other Topics Concern  . Not on file   Social History Narrative  . No narrative on file    No family history on file.  Past Medical History  Diagnosis Date  . CAD (coronary artery disease)     Interventions in the past  . Groin hematoma     April, 2011  . Hypertension   . Diabetes mellitus   . Dyslipidemia   . GERD (gastroesophageal reflux disease)   . Schatzki's ring   . Renal insufficiency   . Polymyalgia rheumatica   . Osteoarthritis     arthroscopic surgery right knee February, 2012  . Spinal stenosis     history of surgery for this  . Ejection fraction     EF 65%, echo, April, 2012, aortic valve sclerosis with very slight gradient  . Preoperative evaluation to rule out surgical contraindication     Patient needs back surgery by Dr. Channing Mutters, May, 2012                . Pancreas cyst     The patient has multiple pancreatic cysts.  This is followed at [facility]         . Complication  of anesthesia     "felt like I was smothering once with mask on my face"  . RBBB (right bundle branch block)     old  . Bradycardia, sinus   . Myocardial infarction 2011; 08/2010  . Shortness of breath on exertion   . Blood transfusion   . Anemia   . Stroke 01/18/11    "I've had 2 TIA's"  . Cancer     ""had hysterectomy for a 6 showing cancer; think that's pretty strong  . Aortic stenosis     very mild, echo 12/2010  . Femoral bruit     01/2011 hosp, but no pseudoaneurysm or AV fistula    Past Surgical History  Procedure Date  . Coronary angioplasty with stent placement 2011    "in [place]"  . Coronary angioplasty with stent placement 08/2010  . Cardiac catheterization 01/18/11  . Cholecystectomy 1987  . Appendectomy   . Back surgery   . Lumbar spine surgery ~ 1977; ~ 2010    "2 hugh ruptured discs; I was paralyzed first OR; 2nd OR by Dr. Channing Mutters, don't know what for"  . Tubal ligation 1970's  . Dilation and curettage of uterus   . Abdominal hysterectomy ~ 1974  . Knee arthroscopy 02/2010    right knee; chrondoplasty medial femoral condyle;  Partial medial meniscectomy.     Patient Active Problem List  Diagnosis  . CAD (coronary artery disease)  . RBBB (right bundle branch block)  . Groin hematoma  . Hypertension  . Diabetes mellitus  . Dyslipidemia  . GERD (gastroesophageal reflux disease)  . Renal insufficiency  . Polymyalgia rheumatica  . Bradycardia  . Ejection fraction  . Preoperative evaluation to rule out surgical contraindication  . Pancreas cyst  . Aortic stenosis  . Femoral bruit  . Shortness of breath on exertion    ROS   Patient denies fever, chills, headache, sweats, rash, change in vision, change in hearing, chest pain, cough, nausea vomiting, urinary symptoms. All of the systems  are reviewed and are negative.  PHYSICAL EXAM   Patient is oriented to person time and place. Affect is normal. There is no jugular venous distention. Lungs are clear.  Respiratory effort is nonlabored. Cardiac exam reveals S1 and S2. There is a systolic murmur that is consistent with her very mild aortic stenosis.. The abdomen is soft. The patient feels that she has some slight swelling in her feet. There is no significant edema.  Filed Vitals:   02/28/12 1054  BP: 147/77  Pulse: 52  Height: 5\' 3"  (1.6 m)  Weight: 149 lb (67.586 kg)     ASSESSMENT & PLAN

## 2012-02-28 NOTE — Assessment & Plan Note (Signed)
When she was on a beta blocker her rate was as low as the 40s. Currently off the beta blocker her rate is in the 50s. She is stable with this. No change in therapy.

## 2012-02-28 NOTE — Assessment & Plan Note (Signed)
Blood pressure remains adequately controlled. No change in therapy.

## 2012-02-28 NOTE — Assessment & Plan Note (Signed)
Her labs show she does have some renal insufficiency. No further workup is needed at this time. I decided not to change her overall potassium dosing.

## 2012-02-28 NOTE — Patient Instructions (Addendum)

## 2012-03-12 DIAGNOSIS — M47817 Spondylosis without myelopathy or radiculopathy, lumbosacral region: Secondary | ICD-10-CM | POA: Diagnosis not present

## 2012-04-16 DIAGNOSIS — I10 Essential (primary) hypertension: Secondary | ICD-10-CM | POA: Diagnosis not present

## 2012-04-16 DIAGNOSIS — E782 Mixed hyperlipidemia: Secondary | ICD-10-CM | POA: Diagnosis not present

## 2012-04-27 DIAGNOSIS — E119 Type 2 diabetes mellitus without complications: Secondary | ICD-10-CM | POA: Diagnosis not present

## 2012-04-27 DIAGNOSIS — H4011X0 Primary open-angle glaucoma, stage unspecified: Secondary | ICD-10-CM | POA: Diagnosis not present

## 2012-04-27 DIAGNOSIS — Z961 Presence of intraocular lens: Secondary | ICD-10-CM | POA: Diagnosis not present

## 2012-06-11 DIAGNOSIS — M47817 Spondylosis without myelopathy or radiculopathy, lumbosacral region: Secondary | ICD-10-CM | POA: Diagnosis not present

## 2012-06-22 DIAGNOSIS — M949 Disorder of cartilage, unspecified: Secondary | ICD-10-CM | POA: Diagnosis not present

## 2012-06-22 DIAGNOSIS — M353 Polymyalgia rheumatica: Secondary | ICD-10-CM | POA: Diagnosis not present

## 2012-06-22 DIAGNOSIS — IMO0002 Reserved for concepts with insufficient information to code with codable children: Secondary | ICD-10-CM | POA: Diagnosis not present

## 2012-06-22 DIAGNOSIS — M899 Disorder of bone, unspecified: Secondary | ICD-10-CM | POA: Diagnosis not present

## 2012-06-23 ENCOUNTER — Encounter (HOSPITAL_COMMUNITY): Payer: Self-pay | Admitting: Dietician

## 2012-06-23 NOTE — Progress Notes (Signed)
[facility] Diabetes Class Completion  Date:June 23, 2012  Time: 1000  Pt attended [facility]'s Diabetes [facility] Education Class on June 23, 2012.   Patient was educated on the following topics: survival skills (signs and symptoms of hyperglycemia and hypoglycemia, treatment for hypoglycemia, ideal levels for fasting and postprandial blood sugars, goal Hgb A1c level, foot care basics), recommendations for physical activity, carbohydrate metabolism in relation to diabetes, and meal planning (sources of carbohydrate, carbohydrate counting, meal planning strategies, food label reading, and portion control).   Carlean Crowl A. Kayan, RD, LDN  

## 2012-07-16 DIAGNOSIS — L570 Actinic keratosis: Secondary | ICD-10-CM | POA: Diagnosis not present

## 2012-08-27 ENCOUNTER — Encounter: Payer: Self-pay | Admitting: Cardiology

## 2012-08-27 ENCOUNTER — Ambulatory Visit (INDEPENDENT_AMBULATORY_CARE_PROVIDER_SITE_OTHER): Payer: Medicare Other | Admitting: Cardiology

## 2012-08-27 VITALS — BP 128/83 | HR 65 | Ht 63.5 in | Wt 144.1 lb

## 2012-08-27 DIAGNOSIS — E785 Hyperlipidemia, unspecified: Secondary | ICD-10-CM | POA: Diagnosis not present

## 2012-08-27 DIAGNOSIS — I359 Nonrheumatic aortic valve disorder, unspecified: Secondary | ICD-10-CM

## 2012-08-27 DIAGNOSIS — I498 Other specified cardiac arrhythmias: Secondary | ICD-10-CM | POA: Diagnosis not present

## 2012-08-27 DIAGNOSIS — I350 Nonrheumatic aortic (valve) stenosis: Secondary | ICD-10-CM

## 2012-08-27 DIAGNOSIS — I2510 Atherosclerotic heart disease of native coronary artery without angina pectoris: Secondary | ICD-10-CM | POA: Diagnosis not present

## 2012-08-27 DIAGNOSIS — R001 Bradycardia, unspecified: Secondary | ICD-10-CM

## 2012-08-27 NOTE — Patient Instructions (Addendum)

## 2012-08-27 NOTE — Progress Notes (Signed)
HPI  Patient is seen today to followup bradycardia, hypertension, coronary artery disease. She has had coronary interventions elsewhere in the past. She has significant chest pain at that time. She is not having any chest pain at this time. She's not had any syncope or presyncope. Overall she's doing well.  Allergies  Allergen Reactions  . Clarithromycin     REACTION: Vomiting and diarrhea.  . Codeine   . Sulfa Antibiotics   . Atorvastatin Other (See Comments)    Made her knees buckle and muscles ache    Current Outpatient Prescriptions  Medication Sig Dispense Refill  . amLODipine (NORVASC) 10 MG tablet Take 10 mg by mouth daily.        Marland Kitchen aspirin 81 MG tablet Take 81 mg by mouth daily.        . chlorthalidone (HYGROTON) 25 MG tablet Take 25 mg by mouth daily.      . cholecalciferol (VITAMIN D) 1000 UNITS tablet Take 1,000 Units by mouth daily.      . clopidogrel (PLAVIX) 75 MG tablet Take 1 tablet (75 mg total) by mouth daily.  90 tablet  3  . divalproex (DEPAKOTE) 500 MG 24 hr tablet Take 500 mg by mouth daily.        Marland Kitchen glimepiride (AMARYL) 4 MG tablet Take 4 mg by mouth 2 (two) times daily.        Marland Kitchen HYDROcodone-acetaminophen (NORCO) 10-325 MG per tablet Take 1 tablet by mouth every 6 (six) hours as needed for pain.      Marland Kitchen insulin detemir (LEVEMIR) 100 UNIT/ML injection Inject 10-20 Units into the skin daily.       . insulin lispro (HUMALOG) 100 UNIT/ML injection Inject into the skin 3 (three) times daily before meals. Pt is on a sliding scale      . isosorbide mononitrate (IMDUR) 60 MG 24 hr tablet Take 1 tablet (60 mg total) by mouth daily.  90 tablet  3  . latanoprost (XALATAN) 0.005 % ophthalmic solution Place 1 drop into both eyes at bedtime.        Marland Kitchen lisinopril (PRINIVIL,ZESTRIL) 20 MG tablet Take 20 mg by mouth daily.        . Melatonin 3 MG TABS Take 1 tablet by mouth at bedtime.      . nitroGLYCERIN (NITROSTAT) 0.4 MG SL tablet Place 1 tablet (0.4 mg total) under the  tongue every 5 (five) minutes as needed for chest pain.  25 tablet  11  . pantoprazole (PROTONIX) 40 MG tablet Take 40 mg by mouth daily.        . potassium chloride SA (K-DUR,KLOR-CON) 20 MEQ tablet Take 20 mEq by mouth as needed.      . predniSONE (DELTASONE) 1 MG tablet Take 3 mg by mouth every morning.       . psyllium (REGULOID) 0.52 G capsule Take 0.52 g by mouth daily.      . rosuvastatin (CRESTOR) 20 MG tablet Take 20 mg by mouth at bedtime.      . timolol (TIMOPTIC) 0.5 % ophthalmic solution Place 1 drop into both eyes daily.        No current facility-administered medications for this visit.    History   Social History  . Marital Status: Married    Spouse Name: N/A    Number of Children: N/A  . Years of Education: N/A   Occupational History  . Not on file.   Social History Main Topics  . Smoking status:  Never Smoker   . Smokeless tobacco: Never Used  . Alcohol Use: No  . Drug Use: No  . Sexually Active: No   Other Topics Concern  . Not on file   Social History Narrative  . No narrative on file    No family history on file.  Past Medical History  Diagnosis Date  . CAD (coronary artery disease)     Interventions in the past  . Groin hematoma     April, 2011  . Hypertension   . Diabetes mellitus   . Dyslipidemia   . GERD (gastroesophageal reflux disease)   . Schatzki's ring   . Renal insufficiency   . Polymyalgia rheumatica   . Osteoarthritis     arthroscopic surgery right knee February, 2012  . Spinal stenosis     history of surgery for this  . Ejection fraction     EF 65%, echo, April, 2012, aortic valve sclerosis with very slight gradient  . Preoperative evaluation to rule out surgical contraindication     Patient needs back surgery by Dr. Channing Mutters, May, 2012                . Pancreas cyst     The patient has multiple pancreatic cysts.  This is [facility] at [facility]         . Complication of anesthesia     "felt like I was smothering once with mask on  my face"  . RBBB (right bundle branch block)     old  . Bradycardia, sinus   . Myocardial infarction 2011; 08/2010  . Shortness of breath on exertion   . Blood transfusion   . Anemia   . Stroke 01/18/11    "I've had 2 TIA's"  . Cancer     ""had hysterectomy for a 6 showing cancer; think that's pretty strong  . Aortic stenosis     very mild, echo 12/2010  . Femoral bruit     01/2011 hosp, but no pseudoaneurysm or AV fistula    Past Surgical History  Procedure Laterality Date  . Coronary angioplasty with stent placement  2011    "in [place]"  . Coronary angioplasty with stent placement  08/2010  . Cardiac catheterization  01/18/11  . Cholecystectomy  1987  . Appendectomy    . Back surgery    . Lumbar spine surgery  ~ 1977; ~ 2010    "2 hugh ruptured discs; I was paralyzed first OR; 2nd OR by Dr. Channing Mutters, don't know what for"  . Tubal ligation  1970's  . Dilation and curettage of uterus    . Abdominal hysterectomy  ~ 1974  . Knee arthroscopy  02/2010    right knee; chrondoplasty medial femoral condyle;  Partial medial meniscectomy.     Patient Active Problem List   Diagnosis Date Noted  . Femoral bruit     Priority: High  . Shortness of breath on exertion   . Aortic stenosis   . Preoperative evaluation to rule out surgical contraindication   . Pancreas cyst   . CAD (coronary artery disease)   . RBBB (right bundle branch block)   . Groin hematoma   . Hypertension   . Diabetes mellitus   . Dyslipidemia   . GERD (gastroesophageal reflux disease)   . Renal insufficiency   . Polymyalgia rheumatica   . Bradycardia   . Ejection fraction     ROS   Patient denies fever, chills, headache, sweats, rash, change in vision, change  in hearing, chest pain, cough, nausea vomiting, urinary symptoms. She does have a lesion on the back of her left hand which will need further dermatology followup. All other systems are reviewed and are negative.  PHYSICAL EXAM  Patient is oriented to  person time and place. Affect is normal. There is no jugulovenous distention. Lungs are clear. Respiratory effort is nonlabored. Cardiac exam reveals an S1 and S2. There is a crescendo decrescendo systolic murmur. The abdomen is soft. There is no peripheral edema. She does have a skin lesion on the back of her left hand. This is to be evaluated by dermatology later this month.  Filed Vitals:   08/27/12 1400  BP: 128/83  Pulse: 65  Height: 5' 3.5" (1.613 m)  Weight: 144 lb 1.9 oz (65.372 kg)     ASSESSMENT & PLAN

## 2012-08-27 NOTE — Assessment & Plan Note (Signed)
Her coronary disease is stable. She does not need exercise testing at this time.

## 2012-08-27 NOTE — Assessment & Plan Note (Signed)
We know that she has aortic stenosis. It was very mild  1 1/2 years ago. I feel she does not need an echo now. We will plan to do an echo when we see her back in the future.

## 2012-08-27 NOTE — Assessment & Plan Note (Signed)
There is no significant bradycardia. No change in therapy. 

## 2012-08-27 NOTE — Assessment & Plan Note (Signed)
Her lipids are being treated. No change in therapy. 

## 2012-09-04 DIAGNOSIS — M47817 Spondylosis without myelopathy or radiculopathy, lumbosacral region: Secondary | ICD-10-CM | POA: Diagnosis not present

## 2012-09-16 DIAGNOSIS — D485 Neoplasm of uncertain behavior of skin: Secondary | ICD-10-CM | POA: Diagnosis not present

## 2012-09-16 DIAGNOSIS — Z85828 Personal history of other malignant neoplasm of skin: Secondary | ICD-10-CM | POA: Diagnosis not present

## 2012-09-16 DIAGNOSIS — C44319 Basal cell carcinoma of skin of other parts of face: Secondary | ICD-10-CM | POA: Diagnosis not present

## 2012-09-16 DIAGNOSIS — C4441 Basal cell carcinoma of skin of scalp and neck: Secondary | ICD-10-CM | POA: Diagnosis not present

## 2012-09-16 DIAGNOSIS — D044 Carcinoma in situ of skin of scalp and neck: Secondary | ICD-10-CM | POA: Diagnosis not present

## 2012-09-16 DIAGNOSIS — C44621 Squamous cell carcinoma of skin of unspecified upper limb, including shoulder: Secondary | ICD-10-CM | POA: Diagnosis not present

## 2012-10-01 DIAGNOSIS — C4441 Basal cell carcinoma of skin of scalp and neck: Secondary | ICD-10-CM | POA: Diagnosis not present

## 2012-10-15 DIAGNOSIS — C4442 Squamous cell carcinoma of skin of scalp and neck: Secondary | ICD-10-CM | POA: Diagnosis not present

## 2012-10-15 DIAGNOSIS — C44621 Squamous cell carcinoma of skin of unspecified upper limb, including shoulder: Secondary | ICD-10-CM | POA: Diagnosis not present

## 2012-11-02 DIAGNOSIS — E1139 Type 2 diabetes mellitus with other diabetic ophthalmic complication: Secondary | ICD-10-CM | POA: Diagnosis not present

## 2012-11-02 DIAGNOSIS — H3530 Unspecified macular degeneration: Secondary | ICD-10-CM | POA: Diagnosis not present

## 2012-11-02 DIAGNOSIS — H4011X0 Primary open-angle glaucoma, stage unspecified: Secondary | ICD-10-CM | POA: Diagnosis not present

## 2012-11-02 DIAGNOSIS — E11319 Type 2 diabetes mellitus with unspecified diabetic retinopathy without macular edema: Secondary | ICD-10-CM | POA: Diagnosis not present

## 2012-11-23 DIAGNOSIS — D485 Neoplasm of uncertain behavior of skin: Secondary | ICD-10-CM | POA: Diagnosis not present

## 2012-11-23 DIAGNOSIS — L570 Actinic keratosis: Secondary | ICD-10-CM | POA: Diagnosis not present

## 2012-11-23 DIAGNOSIS — Z85828 Personal history of other malignant neoplasm of skin: Secondary | ICD-10-CM | POA: Diagnosis not present

## 2012-12-01 DIAGNOSIS — B351 Tinea unguium: Secondary | ICD-10-CM | POA: Diagnosis not present

## 2012-12-01 DIAGNOSIS — E1149 Type 2 diabetes mellitus with other diabetic neurological complication: Secondary | ICD-10-CM | POA: Diagnosis not present

## 2012-12-03 DIAGNOSIS — C44519 Basal cell carcinoma of skin of other part of trunk: Secondary | ICD-10-CM | POA: Diagnosis not present

## 2012-12-07 ENCOUNTER — Other Ambulatory Visit: Payer: Self-pay | Admitting: Cardiology

## 2012-12-07 DIAGNOSIS — M47817 Spondylosis without myelopathy or radiculopathy, lumbosacral region: Secondary | ICD-10-CM | POA: Diagnosis not present

## 2012-12-25 ENCOUNTER — Encounter (HOSPITAL_COMMUNITY): Payer: Self-pay | Admitting: Emergency Medicine

## 2012-12-25 ENCOUNTER — Emergency Department (HOSPITAL_COMMUNITY)
Admission: EM | Admit: 2012-12-25 | Discharge: 2012-12-25 | Disposition: A | Discharge status: Home / Self Care | Payer: Medicare Other | Attending: Emergency Medicine | Admitting: Emergency Medicine

## 2012-12-25 DIAGNOSIS — E1169 Type 2 diabetes mellitus with other specified complication: Secondary | ICD-10-CM | POA: Insufficient documentation

## 2012-12-25 DIAGNOSIS — I10 Essential (primary) hypertension: Secondary | ICD-10-CM | POA: Diagnosis not present

## 2012-12-25 DIAGNOSIS — M1990 Unspecified osteoarthritis, unspecified site: Secondary | ICD-10-CM | POA: Insufficient documentation

## 2012-12-25 DIAGNOSIS — Z862 Personal history of diseases of the blood and blood-forming organs and certain disorders involving the immune mechanism: Secondary | ICD-10-CM | POA: Diagnosis not present

## 2012-12-25 DIAGNOSIS — I2510 Atherosclerotic heart disease of native coronary artery without angina pectoris: Secondary | ICD-10-CM | POA: Insufficient documentation

## 2012-12-25 DIAGNOSIS — E785 Hyperlipidemia, unspecified: Secondary | ICD-10-CM | POA: Insufficient documentation

## 2012-12-25 DIAGNOSIS — R5381 Other malaise: Secondary | ICD-10-CM | POA: Diagnosis not present

## 2012-12-25 DIAGNOSIS — Z9861 Coronary angioplasty status: Secondary | ICD-10-CM | POA: Diagnosis not present

## 2012-12-25 DIAGNOSIS — Z859 Personal history of malignant neoplasm, unspecified: Secondary | ICD-10-CM | POA: Diagnosis not present

## 2012-12-25 DIAGNOSIS — Q391 Atresia of esophagus with tracheo-esophageal fistula: Secondary | ICD-10-CM | POA: Diagnosis not present

## 2012-12-25 DIAGNOSIS — K219 Gastro-esophageal reflux disease without esophagitis: Secondary | ICD-10-CM | POA: Diagnosis not present

## 2012-12-25 DIAGNOSIS — Z7902 Long term (current) use of antithrombotics/antiplatelets: Secondary | ICD-10-CM | POA: Diagnosis not present

## 2012-12-25 DIAGNOSIS — Z7982 Long term (current) use of aspirin: Secondary | ICD-10-CM | POA: Insufficient documentation

## 2012-12-25 DIAGNOSIS — I252 Old myocardial infarction: Secondary | ICD-10-CM | POA: Diagnosis not present

## 2012-12-25 DIAGNOSIS — E162 Hypoglycemia, unspecified: Secondary | ICD-10-CM

## 2012-12-25 DIAGNOSIS — Z8673 Personal history of transient ischemic attack (TIA), and cerebral infarction without residual deficits: Secondary | ICD-10-CM | POA: Insufficient documentation

## 2012-12-25 DIAGNOSIS — Z794 Long term (current) use of insulin: Secondary | ICD-10-CM | POA: Diagnosis not present

## 2012-12-25 DIAGNOSIS — E119 Type 2 diabetes mellitus without complications: Secondary | ICD-10-CM | POA: Diagnosis not present

## 2012-12-25 DIAGNOSIS — Z79899 Other long term (current) drug therapy: Secondary | ICD-10-CM | POA: Diagnosis not present

## 2012-12-25 DIAGNOSIS — Z87448 Personal history of other diseases of urinary system: Secondary | ICD-10-CM | POA: Insufficient documentation

## 2012-12-25 LAB — CBC WITH DIFFERENTIAL/PLATELET
Basophils Absolute: 0 10*3/uL (ref 0.0–0.1)
Basophils Relative: 0 % (ref 0–1)
HCT: 40.5 % (ref 36.0–46.0)
Lymphocytes Relative: 39 % (ref 12–46)
MCHC: 33.1 g/dL (ref 30.0–36.0)
Monocytes Absolute: 0.7 10*3/uL (ref 0.1–1.0)
Neutro Abs: 4.2 10*3/uL (ref 1.7–7.7)
Neutrophils Relative %: 51 % (ref 43–77)
RDW: 15.3 % (ref 11.5–15.5)
WBC: 8.2 10*3/uL (ref 4.0–10.5)

## 2012-12-25 LAB — TROPONIN I: Troponin I: <0.3 ng/mL (ref <0.30)

## 2012-12-25 LAB — GLUCOSE, CAPILLARY
Glucose-Capillary: 97 mg/dL (ref 70–99)
Glucose-Capillary: 178 mg/dL — ABNORMAL HIGH (ref 70–99)
Glucose-Capillary: 212 mg/dL — ABNORMAL HIGH (ref 70–99)

## 2012-12-25 LAB — BASIC METABOLIC PANEL
BUN: 24 mg/dL — ABNORMAL HIGH (ref 6–23)
Chloride: 99 mEq/L (ref 96–112)
Creatinine, Ser: 1.34 mg/dL — ABNORMAL HIGH (ref 0.50–1.10)
GFR calc Af Amer: 43 mL/min — ABNORMAL LOW (ref >90)
GFR calc non Af Amer: 37 mL/min — ABNORMAL LOW (ref >90)
Potassium: 2.9 mEq/L — ABNORMAL LOW (ref 3.5–5.1)

## 2012-12-25 MED ORDER — POTASSIUM CHLORIDE CRYS ER 20 MEQ PO TBCR
40.0000 meq | EXTENDED_RELEASE_TABLET | Freq: Once | ORAL | Status: AC
  Administered 2012-12-25: 40 meq via ORAL
  Filled 2012-12-25: qty 2

## 2012-12-25 MED ORDER — DEXTROSE 50 % IV SOLN
INTRAVENOUS | Status: AC
  Administered 2012-12-25: 50 mL via INTRAVENOUS
  Filled 2012-12-25: qty 50

## 2012-12-25 MED ORDER — GLUCOSE 40 % PO GEL
1.0000 | Freq: Once | ORAL | Status: AC
  Administered 2012-12-25: 37.5 g via ORAL
  Filled 2012-12-25: qty 1

## 2012-12-25 MED ORDER — DEXTROSE 50 % IV SOLN
1.0000 | Freq: Once | INTRAVENOUS | Status: AC
  Administered 2012-12-25: 50 mL via INTRAVENOUS

## 2012-12-25 NOTE — ED Notes (Signed)
Family now at bedside

## 2012-12-25 NOTE — ED Notes (Signed)
bgl [id number]
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
## 2012-12-25 NOTE — ED Provider Notes (Signed)
CSN: [id number]     Arrival date & time 12/25/12  1540 History  This chart was scribed for Celene Kras, MD by Ardelia Mems, ED Scribe. This patient was seen in room [facility] and the patient's care was started at 4:09 PM.   Chief Complaint  Patient presents with  . Hypoglycemia    The history is provided by the patient. No language interpreter was used.    HPI Comments: Sharon Hubbard is a 79 y.o. Female with a history of MI, CAD, HTN, DM, RBBB, bradycardia, anemia, stoke and CA brought by EMS the Emergency Department complaining of hypoglycemia today. She states that she was driving from [place] to pay a bill, and that she remembers "coming to" in the back of an ambulance. She states that after a certain point while driving today, she doesn't remember driving, and she states that she later discovered that she was on the wrong road. Per EMS, pt's initial blood sugar was 35. They administered one amp of D50 and pt's repeat CBG was 193 after this. She states that she believes she is returning to baseline, but she reports generalized weakness currently. She states that she is complaint with her diabetes medications, and took them as prescribed today. She states that she ate breakfast this morning (boiled egg, half of a banana), but skipped lunch, and believes this may be related. She states that she has been feeling well the past few days, and she denies any recent illnesses. She denies fever, chills, chest pain, SOB or any other symptoms.  PCP- Dr. [name]   Past Medical History  Diagnosis Date  . CAD (coronary artery disease)     Interventions in the past  . Groin hematoma     April, 2011  . Hypertension   . Diabetes mellitus   . Dyslipidemia   . GERD (gastroesophageal reflux disease)   . Schatzki's ring   . Renal insufficiency   . Polymyalgia rheumatica   . Osteoarthritis     arthroscopic surgery right knee February, 2012  . Spinal stenosis     history of surgery for  this  . Ejection fraction     EF 65%, echo, April, 2012, aortic valve sclerosis with very slight gradient  . Preoperative evaluation to rule out surgical contraindication     Patient needs back surgery by Dr. Channing Mutters, May, 2012                . Pancreas cyst     The patient has multiple pancreatic cysts.  This is followed at [facility]         . Complication of anesthesia     "felt like I was smothering once with mask on my face"  . RBBB (right bundle branch block)     old  . Bradycardia, sinus   . Myocardial infarction 2011; 08/2010  . Shortness of breath on exertion   . Blood transfusion   . Anemia   . Stroke 01/18/11    "I've had 2 TIA's"  . Cancer     ""had hysterectomy for a 6 showing cancer; think that's pretty strong  . Aortic stenosis     very mild, echo 12/2010  . Femoral bruit     01/2011 hosp, but no pseudoaneurysm or AV fistula   Past Surgical History  Procedure Laterality Date  . Coronary angioplasty with stent placement  2011    "in [place]"  . Coronary angioplasty with stent placement  08/2010  . Cardiac  catheterization  01/18/11  . Cholecystectomy  1987  . Appendectomy    . Back surgery    . Lumbar spine surgery  ~ 1977; ~ 2010    "2 hugh ruptured discs; I was paralyzed first OR; 2nd OR by Dr. Channing Mutters, don't know what for"  . Tubal ligation  1970's  . Dilation and curettage of uterus    . Abdominal hysterectomy  ~ 1974  . Knee arthroscopy  02/2010    right knee; chrondoplasty medial femoral condyle;  Partial medial meniscectomy.    No family history on file. History  Substance Use Topics  . Smoking status: Never Smoker   . Smokeless tobacco: Never Used  . Alcohol Use: No   OB History   Grav Para Term Preterm Abortions TAB SAB Ect Mult Living                 Review of Systems  Constitutional: Negative for fever and chills.  Respiratory: Negative for shortness of breath.   Cardiovascular: Negative for chest pain.  Neurological: Positive for weakness.  All  other systems reviewed and are negative.   Allergies  Clarithromycin; Codeine; Sulfa antibiotics; and Atorvastatin  Home Medications   Current Outpatient Rx  Name  Route  Sig  Dispense  Refill  . glimepiride (AMARYL) 4 MG tablet   Oral   Take 4 mg by mouth 2 (two) times daily.           . insulin detemir (LEVEMIR) 100 UNIT/ML injection   Subcutaneous   Inject 10-20 Units into the skin at bedtime. Takes only depeniding on blood sugar levels         . insulin lispro (HUMALOG) 100 UNIT/ML injection   Subcutaneous   Inject 0-10 Units into the skin 3 (three) times daily before meals. Pt is on a sliding scale         . amLODipine (NORVASC) 10 MG tablet   Oral   Take 10 mg by mouth daily.           Marland Kitchen aspirin 81 MG tablet   Oral   Take 81 mg by mouth daily.           . chlorthalidone (HYGROTON) 25 MG tablet   Oral   Take 25 mg by mouth daily.         . cholecalciferol (VITAMIN D) 1000 UNITS tablet   Oral   Take 1,000 Units by mouth daily.         . clopidogrel (PLAVIX) 75 MG tablet      TAKE ONE TABLET (75MG ) BY MOUTH DAILY   90 tablet   3   . divalproex (DEPAKOTE) 500 MG 24 hr tablet   Oral   Take 500 mg by mouth daily.           Marland Kitchen HYDROcodone-acetaminophen (NORCO) 10-325 MG per tablet   Oral   Take 1 tablet by mouth every 6 (six) hours as needed for pain.         . isosorbide mononitrate (IMDUR) 60 MG 24 hr tablet   Oral   Take 1 tablet (60 mg total) by mouth daily.   90 tablet   3   . latanoprost (XALATAN) 0.005 % ophthalmic solution   Both Eyes   Place 1 drop into both eyes at bedtime.           Marland Kitchen lisinopril (PRINIVIL,ZESTRIL) 20 MG tablet   Oral   Take 20 mg by mouth daily.           Marland Kitchen  Melatonin 3 MG TABS   Oral   Take 1 tablet by mouth at bedtime.         Marland Kitchen EXPIRED: nitroGLYCERIN (NITROSTAT) 0.4 MG SL tablet   Sublingual   Place 1 tablet (0.4 mg total) under the tongue every 5 (five) minutes as needed for chest pain.   25  tablet   11   . pantoprazole (PROTONIX) 40 MG tablet   Oral   Take 40 mg by mouth daily.           . potassium chloride SA (K-DUR,KLOR-CON) 20 MEQ tablet   Oral   Take 20 mEq by mouth as needed.         . predniSONE (DELTASONE) 1 MG tablet   Oral   Take 3 mg by mouth every morning.          . psyllium (REGULOID) 0.52 G capsule   Oral   Take 0.52 g by mouth daily.         . rosuvastatin (CRESTOR) 20 MG tablet   Oral   Take 20 mg by mouth at bedtime.         . timolol (TIMOPTIC) 0.5 % ophthalmic solution   Both Eyes   Place 1 drop into both eyes daily.           Triage Vitals: BP 169/49  Pulse 43  Temp(Src) 98 F (36.7 C) (Oral)  Resp 18  Ht 5\' 3"  (1.6 m)  Wt 140 lb (63.504 kg)  BMI 24.81 kg/m2  SpO2 99%  Physical Exam  Nursing note and vitals reviewed. Constitutional: She appears well-developed and well-nourished. No distress.  HENT:  Head: Normocephalic and atraumatic.  Right Ear: External ear normal.  Left Ear: External ear normal.  Eyes: Conjunctivae are normal. Right eye exhibits no discharge. Left eye exhibits no discharge. No scleral icterus.  Neck: Neck supple. No tracheal deviation present.  Cardiovascular: Normal rate, regular rhythm and intact distal pulses.   Pulmonary/Chest: Effort normal and breath sounds normal. No stridor. No respiratory distress. She has no wheezes. She has no rales.  Abdominal: Soft. Bowel sounds are normal. She exhibits no distension. There is no tenderness. There is no rebound and no guarding.  Musculoskeletal: She exhibits no edema and no tenderness.  Neurological: She is alert. She has normal strength. No sensory deficit. Cranial nerve deficit:  no gross defecits noted. She exhibits normal muscle tone. She displays no seizure activity. Coordination normal.  Skin: Skin is warm and dry. No rash noted.  Psychiatric: She has a normal mood and affect.    ED Course  Procedures (including critical care  time)  DIAGNOSTIC STUDIES: Oxygen Saturation is 99% on RA, normal by my interpretation.    COORDINATION OF CARE: 4:05 PM- Discussed plan to obtain diagnostic lab work. Will order Glutose gel. Pt advised of plan for treatment and pt agrees. 1637  Notified that pt's repeat blood sugar is down in the 40s.  She had just been given glucagel but had not been given any food yet.  Staff will give patient peanut butter, crackers and juice.  Repeat d50  Medications  dextrose (GLUTOSE) 40 % oral gel 37.5 g (37.5 g Oral Given 12/25/12 1625)  dextrose 50 % solution 50 mL (50 mLs Intravenous Given 12/25/12 1638)  potassium chloride SA (K-DUR,KLOR-CON) CR tablet 40 mEq (40 mEq Oral Given 12/25/12 1928)   Labs Review Labs Reviewed  BASIC METABOLIC PANEL - Abnormal; Notable for the following:    Potassium 2.9 (*)  Glucose, Bld 233 (*)    BUN 24 (*)    Creatinine, Ser 1.34 (*)    GFR calc non Af Amer 37 (*)    GFR calc Af Amer 43 (*)    All other components within normal limits  CBC WITH DIFFERENTIAL - Abnormal; Notable for the following:    Platelets 145 (*)    All other components within normal limits  GLUCOSE, CAPILLARY - Abnormal; Notable for the following:    Glucose-Capillary 44 (*)    All other components within normal limits  GLUCOSE, CAPILLARY - Abnormal; Notable for the following:    Glucose-Capillary 212 (*)    All other components within normal limits  GLUCOSE, CAPILLARY - Abnormal; Notable for the following:    Glucose-Capillary 178 (*)    All other components within normal limits  GLUCOSE, CAPILLARY - Abnormal; Notable for the following:    Glucose-Capillary 212 (*)    All other components within normal limits  GLUCOSE, CAPILLARY - Abnormal; Notable for the following:    Glucose-Capillary 220 (*)    All other components within normal limits  GLUCOSE, CAPILLARY  TROPONIN I   Imaging Review No results found.  EKG Sinus bradycardia rate 47 Normal axis Nonspecific  intraventricular block Prior EKG showed sinus bradycardia with a rate in the 40s  MDM   1. Hypoglycemia     The patient was monitored for several hours.  He blood sugar has improved and has remained stable.  Discussed findings with patient and her family.  Suspect her low blood sugar was related to a skipped meal.  Will have her hold her insulin today.  Discussed close monitoring with patient and family.  I personally performed the services described in this documentation, which was scribed in my presence.  The recorded information has been reviewed and is accurate.   Celene Kras, MD 12/25/12 2008

## 2013-01-18 DIAGNOSIS — Z0000 Encounter for general adult medical examination without abnormal findings: Secondary | ICD-10-CM | POA: Diagnosis not present

## 2013-02-09 DIAGNOSIS — E1149 Type 2 diabetes mellitus with other diabetic neurological complication: Secondary | ICD-10-CM | POA: Diagnosis not present

## 2013-02-09 DIAGNOSIS — B351 Tinea unguium: Secondary | ICD-10-CM | POA: Diagnosis not present

## 2013-03-01 ENCOUNTER — Other Ambulatory Visit: Payer: Self-pay | Admitting: Cardiology

## 2013-03-05 ENCOUNTER — Ambulatory Visit (INDEPENDENT_AMBULATORY_CARE_PROVIDER_SITE_OTHER): Payer: Medicare Other | Admitting: Cardiology

## 2013-03-05 ENCOUNTER — Encounter: Payer: Self-pay | Admitting: Cardiology

## 2013-03-05 VITALS — BP 155/72 | HR 49 | Ht 63.0 in | Wt 146.8 lb

## 2013-03-05 DIAGNOSIS — M353 Polymyalgia rheumatica: Secondary | ICD-10-CM | POA: Diagnosis not present

## 2013-03-05 DIAGNOSIS — I10 Essential (primary) hypertension: Secondary | ICD-10-CM | POA: Diagnosis not present

## 2013-03-05 DIAGNOSIS — R0602 Shortness of breath: Secondary | ICD-10-CM

## 2013-03-05 DIAGNOSIS — I2510 Atherosclerotic heart disease of native coronary artery without angina pectoris: Secondary | ICD-10-CM | POA: Diagnosis not present

## 2013-03-05 MED ORDER — FUROSEMIDE 40 MG PO TABS: 40.0000 mg | ORAL_TABLET | Freq: Every day | ORAL | Status: DC

## 2013-03-05 NOTE — Assessment & Plan Note (Signed)
The patient is now on 4 mg of prednisone daily. This is a slight increase in the past but she's been on it chronically.

## 2013-03-05 NOTE — Assessment & Plan Note (Signed)
Blood pressure is controlled. No change in therapy. 

## 2013-03-05 NOTE — Assessment & Plan Note (Signed)
I believe her coronary disease is stable. However it is possible that her exertional shortness of breath could be an anginal equivalent. This will be kept in mind when I see her in followup.

## 2013-03-05 NOTE — Patient Instructions (Addendum)
Your physician recommends that you schedule a follow-up appointment in: 3-4 weeks. Your physician has recommended you make the following change in your medication: HOLD CHLORTHALIDONE. START FUROSEMIDE 40 MG DAILY. Your prescription was sent to your pharmacy today.  All other medications will remain the same. Your physician has requested that you have an echocardiogram. Echocardiography is a painless test that uses sound waves to create images of your heart. It provides your doctor with information about the size and shape of your heart and how well your heart's chambers and valves are working. This procedure takes approximately one hour. There are no restrictions for this procedure.

## 2013-03-05 NOTE — Progress Notes (Signed)
HPI  Patient is seen today to followup history of hypertension and coronary disease. She's not having any chest pain. However she is having some increase in exertional shortness of breath. There is no PND or orthopnea. She says that she develops some edema during the day. It goes down at nighttime but does not go weight completely. She does not use excess salt. Her fluid intake is appropriately moderate and not excessive. She is on low-dose prednisone. This is chronic and there is been no significant change. The dosing is overseen by rheumatology in [place]. She has many diffuse aches and pains.  Allergies  Allergen Reactions  . Clarithromycin     REACTION: Vomiting and diarrhea.  . Codeine   . Sulfa Antibiotics   . Atorvastatin Other (See Comments)    Made her knees buckle and muscles ache    Current Outpatient Prescriptions  Medication Sig Dispense Refill  . amLODipine (NORVASC) 10 MG tablet Take 10 mg by mouth daily.        Marland Kitchen aspirin 81 MG tablet Take 81 mg by mouth daily.        . chlorthalidone (HYGROTON) 25 MG tablet Take 25 mg by mouth daily.      . cholecalciferol (VITAMIN D) 1000 UNITS tablet Take 1,000 Units by mouth daily.      . clopidogrel (PLAVIX) 75 MG tablet TAKE ONE TABLET (75MG ) BY MOUTH DAILY  90 tablet  3  . divalproex (DEPAKOTE) 500 MG 24 hr tablet Take 500 mg by mouth daily.        Marland Kitchen glimepiride (AMARYL) 4 MG tablet Take 4 mg by mouth 2 (two) times daily.        Marland Kitchen HYDROcodone-acetaminophen (NORCO) 10-325 MG per tablet Take 1 tablet by mouth every 6 (six) hours as needed for pain.      Marland Kitchen insulin detemir (LEVEMIR) 100 UNIT/ML injection Inject 10-20 Units into the skin at bedtime. Takes only depeniding on blood sugar levels      . insulin lispro (HUMALOG) 100 UNIT/ML injection Inject 0-10 Units into the skin 3 (three) times daily before meals. Pt is on a sliding scale      . isosorbide mononitrate (IMDUR) 60 MG 24 hr tablet TAKE ONE TABLET (60MG ) BY MOUTH DAILY   90 tablet  1  . latanoprost (XALATAN) 0.005 % ophthalmic solution Place 1 drop into both eyes at bedtime.        Marland Kitchen lisinopril (PRINIVIL,ZESTRIL) 20 MG tablet Take 20 mg by mouth daily.        . pantoprazole (PROTONIX) 40 MG tablet Take 40 mg by mouth daily.        . predniSONE (DELTASONE) 1 MG tablet Take 3 mg by mouth every morning.       . rosuvastatin (CRESTOR) 20 MG tablet Take 20 mg by mouth at bedtime.      . timolol (TIMOPTIC) 0.5 % ophthalmic solution Place 1 drop into both eyes daily.       . nitroGLYCERIN (NITROSTAT) 0.4 MG SL tablet Place 1 tablet (0.4 mg total) under the tongue every 5 (five) minutes as needed for chest pain.  25 tablet  11  . potassium chloride SA (K-DUR,KLOR-CON) 20 MEQ tablet Take 20 mEq by mouth as needed.       No current facility-administered medications for this visit.    History   Social History  . Marital Status: Married    Spouse Name: N/A    Number of Children: N/A  .  Years of Education: N/A   Occupational History  . Not on file.   Social History Main Topics  . Smoking status: Never Smoker   . Smokeless tobacco: Never Used  . Alcohol Use: No  . Drug Use: No  . Sexual Activity: No   Other Topics Concern  . Not on file   Social History Narrative  . No narrative on file    No family history on file.  Past Medical History  Diagnosis Date  . CAD (coronary artery disease)     Interventions in the past  . Groin hematoma     April, 2011  . Hypertension   . Diabetes mellitus   . Dyslipidemia   . GERD (gastroesophageal reflux disease)   . Schatzki's ring   . Renal insufficiency   . Polymyalgia rheumatica   . Osteoarthritis     arthroscopic surgery right knee February, 2012  . Spinal stenosis     history of surgery for this  . Ejection fraction     EF 65%, echo, April, 2012, aortic valve sclerosis with very slight gradient  . Preoperative evaluation to rule out surgical contraindication     Patient needs back surgery by Dr. Carloyn Manner,  May, 2012                . Pancreas cyst     The patient has multiple pancreatic cysts.  This is followed at [facility]         . Complication of anesthesia     "felt like I was smothering once with mask on my face"  . RBBB (right bundle branch block)     old  . Bradycardia, sinus   . Myocardial infarction 2011; 08/2010  . Shortness of breath on exertion   . Blood transfusion   . Anemia   . Stroke 01/18/11    "I've had 2 TIA's"  . Cancer     ""had hysterectomy for a 6 showing cancer; think that's pretty strong  . Aortic stenosis     very mild, echo 12/2010  . Femoral bruit     01/2011 hosp, but no pseudoaneurysm or AV fistula    Past Surgical History  Procedure Laterality Date  . Coronary angioplasty with stent placement  2011    "in [place]"  . Coronary angioplasty with stent placement  08/2010  . Cardiac catheterization  01/18/11  . Cholecystectomy  1987  . Appendectomy    . Back surgery    . Lumbar spine surgery  ~ 1977; ~ 2010    "2 hugh ruptured discs; I was paralyzed first OR; 2nd OR by Dr. Carloyn Manner, don't know what for"  . Tubal ligation  1970's  . Dilation and curettage of uterus    . Abdominal hysterectomy  ~ 1974  . Knee arthroscopy  02/2010    right knee; chrondoplasty medial femoral condyle;  Partial medial meniscectomy.     Patient Active Problem List   Diagnosis Date Noted  . Femoral bruit     Priority: High  . Shortness of breath on exertion   . Aortic stenosis   . Preoperative evaluation to rule out surgical contraindication   . Pancreas cyst   . CAD (coronary artery disease)   . RBBB (right bundle branch block)   . Groin hematoma   . Hypertension   . Diabetes mellitus   . Dyslipidemia   . GERD (gastroesophageal reflux disease)   . Renal insufficiency   . Polymyalgia rheumatica   . Bradycardia   .  Ejection fraction     ROS   Patient denies fever, chills, headache, sweats, rash, change in vision, change in hearing, chest pain, cough, nausea vomiting,  urinary symptoms. All other systems are reviewed and are negative.  PHYSICAL EXAM  Patient is oriented to person time and place. Affect is normal. There is no jugulovenous distention. Lungs are clear. Respiratory effort is nonlabored. Cardiac exam reveals S1 without S2. There is a 3/6 crescendo decrescendo systolic murmur. This is consistent with aortic valvular disease. Abdomen is soft. There is trace peripheral edema. There are no musculoskeletal deformities. There are no skin rashes.  Filed Vitals:   03/05/13 1027  BP: 155/72  Pulse: 49  Height: 5\' 3"  (1.6 m)  Weight: 146 lb 12 oz (66.565 kg)    ASSESSMENT & PLAN

## 2013-03-05 NOTE — Assessment & Plan Note (Signed)
The patient is having shortness of breath with exertion. She does not have PND or orthopnea. She does have some slight edema. She has definite aortic stenosis. Her murmur may be louder now. We need to reassess the severity of her aortic stenosis and reassess LV function. Two-dimensional echo will be done. I am also changing her chlorthalidone to Lasix 40 mg daily. I will then see her back to reassess all of the information.

## 2013-03-10 ENCOUNTER — Other Ambulatory Visit (INDEPENDENT_AMBULATORY_CARE_PROVIDER_SITE_OTHER): Payer: Medicare Other

## 2013-03-10 ENCOUNTER — Other Ambulatory Visit: Payer: Self-pay

## 2013-03-10 DIAGNOSIS — I359 Nonrheumatic aortic valve disorder, unspecified: Secondary | ICD-10-CM | POA: Diagnosis not present

## 2013-03-10 DIAGNOSIS — R0609 Other forms of dyspnea: Secondary | ICD-10-CM

## 2013-03-10 DIAGNOSIS — R609 Edema, unspecified: Secondary | ICD-10-CM

## 2013-03-10 DIAGNOSIS — R0989 Other specified symptoms and signs involving the circulatory and respiratory systems: Secondary | ICD-10-CM

## 2013-03-10 DIAGNOSIS — R0602 Shortness of breath: Secondary | ICD-10-CM

## 2013-03-10 DIAGNOSIS — I2510 Atherosclerotic heart disease of native coronary artery without angina pectoris: Secondary | ICD-10-CM

## 2013-03-11 ENCOUNTER — Encounter: Payer: Self-pay | Admitting: Cardiology

## 2013-03-12 ENCOUNTER — Telehealth: Payer: Self-pay | Admitting: Cardiology

## 2013-03-12 NOTE — Telephone Encounter (Signed)
Patient would like to know results of test

## 2013-03-15 NOTE — Telephone Encounter (Signed)
Notes Recorded by Carlena Bjornstad, MD on 03/11/2013 at 5:16 PM Please let the patient know that the echo test looks good. Pumping function or her heart is good. There is some narrowing of the aortic valve but it is only mild or moderate. I will discuss further with her in followup

## 2013-03-15 NOTE — Telephone Encounter (Signed)
Patient informed. 

## 2013-03-23 ENCOUNTER — Encounter: Payer: Self-pay | Admitting: Cardiology

## 2013-03-23 ENCOUNTER — Telehealth: Payer: Self-pay | Admitting: Cardiology

## 2013-03-23 ENCOUNTER — Encounter: Payer: Self-pay | Admitting: *Deleted

## 2013-03-23 ENCOUNTER — Ambulatory Visit (INDEPENDENT_AMBULATORY_CARE_PROVIDER_SITE_OTHER): Payer: Medicare Other | Admitting: Cardiology

## 2013-03-23 VITALS — BP 163/80 | HR 52 | Ht 63.0 in | Wt 144.0 lb

## 2013-03-23 DIAGNOSIS — S301XXA Contusion of abdominal wall, initial encounter: Secondary | ICD-10-CM | POA: Diagnosis not present

## 2013-03-23 DIAGNOSIS — R0989 Other specified symptoms and signs involving the circulatory and respiratory systems: Secondary | ICD-10-CM

## 2013-03-23 DIAGNOSIS — I10 Essential (primary) hypertension: Secondary | ICD-10-CM

## 2013-03-23 DIAGNOSIS — I359 Nonrheumatic aortic valve disorder, unspecified: Secondary | ICD-10-CM

## 2013-03-23 DIAGNOSIS — I350 Nonrheumatic aortic (valve) stenosis: Secondary | ICD-10-CM

## 2013-03-23 DIAGNOSIS — I2510 Atherosclerotic heart disease of native coronary artery without angina pectoris: Secondary | ICD-10-CM | POA: Diagnosis not present

## 2013-03-23 DIAGNOSIS — R0602 Shortness of breath: Secondary | ICD-10-CM | POA: Diagnosis not present

## 2013-03-23 DIAGNOSIS — R9430 Abnormal result of cardiovascular function study, unspecified: Secondary | ICD-10-CM

## 2013-03-23 NOTE — Patient Instructions (Signed)
Your physician recommends that you continue on your current medications as directed. Please refer to the Current Medication list given to you today. Your physician has requested that you have a cardiac catheterization. Cardiac catheterization is used to diagnose and/or treat various heart conditions. Doctors may recommend this procedure for a number of different reasons. The most common reason is to evaluate chest pain. Chest pain can be a symptom of coronary artery disease (CAD), and cardiac catheterization can show whether plaque is narrowing or blocking your heart's arteries. This procedure is also used to evaluate the valves, as well as measure the blood flow and oxygen levels in different parts of your heart. For further information please visit www.cardiosmart.org. Please follow instruction sheet, as given.   

## 2013-03-23 NOTE — H&P (Signed)
Sharon Hubbard  History and physical for cardiac catheterization:   HPI  Patient is seen back today to followup coronary disease and exertional shortness of breath. I saw her last March 05, 2013. I adjusted her diuretics. She has less edema. She hasn't only as the day goes on now. However she continues to have very significant exertional shortness of breath. Her daughter is in the room and documents this clearly. She says that she feels like she did before her last coronary stent. She does get chest heaviness with her exertional shortness of breath.   At the time of her last visit I also arranged for a followup 2-D echo. Ejection fraction is 65%. She has mild to moderate aortic stenosis. This may be slightly worse than the past. I doubt it is causing her symptoms. Allergies   Allergen  Reactions   .  Clarithromycin         REACTION: Vomiting and diarrhea.   .  Codeine     .  Sulfa Antibiotics     .  Atorvastatin  Other (See Comments)       Made her knees buckle and muscles ache         Current Outpatient Prescriptions   Medication  Sig  Dispense  Refill   .  amLODipine (NORVASC) 10 MG tablet  Take 10 mg by mouth daily.           Marland Kitchen  aspirin 81 MG tablet  Take 81 mg by mouth daily.           .  cholecalciferol (VITAMIN D) 1000 UNITS tablet  Take 1,000 Units by mouth daily.         .  clopidogrel (PLAVIX) 75 MG tablet  TAKE ONE TABLET (75MG ) BY MOUTH DAILY   90 tablet   3   .  divalproex (DEPAKOTE) 500 MG 24 hr tablet  Take 500 mg by mouth daily.           .  furosemide (LASIX) 40 MG tablet  Take 1 tablet (40 mg total) by mouth daily.   90 tablet   0   .  glimepiride (AMARYL) 4 MG tablet  Take 4 mg by mouth 2 (two) times daily.           Marland Kitchen  HYDROcodone-acetaminophen (NORCO) 10-325 MG per tablet  Take 1 tablet by mouth every 6 (six) hours as needed for pain.         Marland Kitchen  insulin detemir (LEVEMIR) 100 UNIT/ML injection  Inject 10-20 Units into the skin at bedtime. Takes only  depeniding on blood sugar levels         .  insulin lispro (HUMALOG) 100 UNIT/ML injection  Inject 0-10 Units into the skin 3 (three) times daily before meals. Pt is on a sliding scale         .  isosorbide mononitrate (IMDUR) 60 MG 24 hr tablet  TAKE ONE TABLET (60MG ) BY MOUTH DAILY   90 tablet   1   .  latanoprost (XALATAN) 0.005 % ophthalmic solution  Place 1 drop into both eyes at bedtime.           Marland Kitchen  lisinopril (PRINIVIL,ZESTRIL) 20 MG tablet  Take 20 mg by mouth daily.           .  nitroGLYCERIN (NITROSTAT) 0.4 MG SL tablet  Place 0.4 mg under the tongue every 5 (five) minutes as needed for chest pain.         Marland Kitchen  pantoprazole (PROTONIX) 40 MG tablet  Take 40 mg by mouth daily.           .  predniSONE (DELTASONE) 1 MG tablet  Take 4 mg by mouth every morning.          .  rosuvastatin (CRESTOR) 20 MG tablet  Take 20 mg by mouth at bedtime.         .  timolol (TIMOPTIC) 0.5 % ophthalmic solution  Place 1 drop into both eyes daily.          .  chlorthalidone (HYGROTON) 25 MG tablet  Take 25 mg by mouth daily.         .  potassium chloride SA (K-DUR,KLOR-CON) 20 MEQ tablet  Take 20 mEq by mouth as needed.             No current facility-administered medications for this visit.         History       Social History   .  Marital Status:  Married       Spouse Name:  N/A       Number of Children:  N/A   .  Years of Education:  N/A       Occupational History   .  Not on file.       Social History Main Topics   .  Smoking status:  Never Smoker    .  Smokeless tobacco:  Never Used   .  Alcohol Use:  No   .  Drug Use:  No   .  Sexual Activity:  No       Other Topics  Concern   .  Not on file       Social History Narrative   .  No narrative on file        No family history on file.    Past Medical History   Diagnosis  Date   .  CAD (coronary artery disease)         Interventions in the past   .  Groin hematoma         April, 2011   .  Hypertension     .  Diabetes  mellitus     .  Dyslipidemia     .  GERD (gastroesophageal reflux disease)     .  Schatzki's ring     .  Renal insufficiency     .  Polymyalgia rheumatica     .  Osteoarthritis         arthroscopic surgery right knee February, 2012   .  Spinal stenosis         history of surgery for this   .  Ejection fraction         EF 65%, echo, April, 2012, aortic valve sclerosis with very slight gradient   .  Preoperative evaluation to rule out surgical contraindication         Patient needs back surgery by Dr. Carloyn Manner, May, 2012                 .  Pancreas cyst         The patient has multiple pancreatic cysts.  This is followed at [facility]          .  Complication of anesthesia         "felt like I was smothering once with mask on my face"   .  RBBB (right bundle branch block)  old   .  Bradycardia, sinus     .  Myocardial infarction  2011; 08/2010   .  Shortness of breath on exertion     .  Blood transfusion     .  Anemia     .  Stroke  01/18/11       "I've had 2 TIA's"   .  Cancer         ""had hysterectomy for a 6 showing cancer; think that's pretty strong   .  Aortic stenosis         very mild, echo 12/2010   .  Femoral bruit         01/2011 hosp, but no pseudoaneurysm or AV fistula         Past Surgical History   Procedure  Laterality  Date   .  Coronary angioplasty with stent placement    2011       "in [place]"   .  Coronary angioplasty with stent placement    08/2010   .  Cardiac catheterization    01/18/11   .  Cholecystectomy    1987   .  Appendectomy       .  Back surgery       .  Lumbar spine surgery    ~ 1977; ~ 2010       "2 hugh ruptured discs; I was paralyzed first OR; 2nd OR by Dr. Carloyn Manner, don't know what for"   .  Tubal ligation    1970's   .  Dilation and curettage of uterus       .  Abdominal hysterectomy    ~ 1974   .  Knee arthroscopy    02/2010       right knee; chrondoplasty medial femoral condyle;  Partial medial meniscectomy.          Patient  Active Problem List     Diagnosis  Date Noted   .  Femoral bruit         Priority: High   .  Shortness of breath on exertion     .  Aortic stenosis     .  Preoperative evaluation to rule out surgical contraindication     .  Pancreas cyst     .  CAD (coronary artery disease)     .  RBBB (right bundle branch block)     .  Groin hematoma     .  Hypertension     .  Diabetes mellitus     .  Dyslipidemia     .  GERD (gastroesophageal reflux disease)     .  Renal insufficiency     .  Polymyalgia rheumatica     .  Bradycardia     .  Ejection fraction          ROS    Patient denies fever, chills, headache, sweats, rash, change in vision, change in hearing, cough, nausea vomiting, urinary symptoms. All other systems are reviewed and are negative.   PHYSICAL EXAM  Today she is here with her youngest daughter area she is oriented to person time and place. Affect is normal. There is no jugulovenous distention. Lungs are clear. Respiratory effort is nonlabored. Cardiac exam her vitals S1 and S2. There no clicks. There is a soft systolic murmur. Abdomen is soft. She has no peripheral edema at this time. There no musculoskeletal deformities. There are no skin rashes.    Filed Vitals:  03/23/13 1114   BP:  163/80   Pulse:  52   Height:  5\' 3"  (1.6 m)   Weight:  144 lb (65.318 kg)   SpO2:  99%          ASSESSMENT & PLAN            CAD (coronary artery disease) - Carlena Bjornstad, MD at 03/23/2013 11:46 AM    Status: Written Related Problem: CAD (coronary artery disease)    The patient has known coronary disease. She had drug-eluting stents placed elsewhere in the past. She had a drug-eluting stent placed in [place] in August, 2012. Catheterization in December, 2012 revealed patent stents. There was a small/moderate diagonal with a 90% stenosis that could cause some symptoms. She had been stable over time. Now she has increase in symptoms. She says that it feels like her original  symptoms before her stents were placed. I decided to proceed with catheterization. She will have right and left heart cath. Aortic valve can be assessed further. We will assess her right heart pressures and her coronaries. Careful attention needs to be paid to reviewing her history concerning the catheterization sites.         Groin hematoma - Carlena Bjornstad, MD at 03/23/2013 11:46 AM    Status: Written Related Problem: Groin hematoma    The patient has significant groin bleed historically in 2011. This was not in the lab at [facility]         Hypertension - Carlena Bjornstad, MD at 03/23/2013 11:47 AM    Status: Written Related Problem: Hypertension    Systolic pressure is mildly elevated today. I've chosen not to change her medications further as of today. Further titration will be needed later.         Ejection fraction - Carlena Bjornstad, MD at 03/23/2013 11:47 AM    Status: Written Related Problem: Ejection fraction    Her ejection fraction remains normal.         Aortic stenosis - Carlena Bjornstad, MD at 03/23/2013 11:48 AM    Status: Written Related Problem: Aortic stenosis    She has aortic stenosis. It may have progressed some since 2012. However it is no more than moderate.         Shortness of breath on exertion - Carlena Bjornstad, MD at 03/23/2013 11:48 AM    Status: Written Related Problem: Shortness of breath on exertion    It appears that her exertional shortness of breath may be an anginal:. We will proceed with catheterization.   Today I spent greater than 25 minutes with the patient's total care. I reviewed her lab results and had a dense extensive discussion with the patient and her daughter. We carefully considered all options and decided to proceed with catheterization. I am making all of these arrangements.

## 2013-03-23 NOTE — Assessment & Plan Note (Signed)
Systolic pressure is mildly elevated today. I've chosen not to change her medications further as of today. Further titration will be needed later.

## 2013-03-23 NOTE — Progress Notes (Signed)
HPI  Patient is seen back today to followup coronary disease and exertional shortness of breath. I saw her last March 05, 2013. I adjusted her diuretics. She has less edema. She hasn't only as the day goes on now. However she continues to have very significant exertional shortness of breath. Her daughter is in the room and documents this clearly. She says that she feels like she did before her last coronary stent. She does get chest heaviness with her exertional shortness of breath.  At the time of her last visit I also arranged for a followup 2-D echo. Ejection fraction is 65%. She has mild to moderate aortic stenosis. This may be slightly worse than the past. I doubt it is causing her symptoms. Allergies  Allergen Reactions  . Clarithromycin     REACTION: Vomiting and diarrhea.  . Codeine   . Sulfa Antibiotics   . Atorvastatin Other (See Comments)    Made her knees buckle and muscles ache    Current Outpatient Prescriptions  Medication Sig Dispense Refill  . amLODipine (NORVASC) 10 MG tablet Take 10 mg by mouth daily.        Marland Kitchen aspirin 81 MG tablet Take 81 mg by mouth daily.        . cholecalciferol (VITAMIN D) 1000 UNITS tablet Take 1,000 Units by mouth daily.      . clopidogrel (PLAVIX) 75 MG tablet TAKE ONE TABLET (75MG ) BY MOUTH DAILY  90 tablet  3  . divalproex (DEPAKOTE) 500 MG 24 hr tablet Take 500 mg by mouth daily.        . furosemide (LASIX) 40 MG tablet Take 1 tablet (40 mg total) by mouth daily.  90 tablet  0  . glimepiride (AMARYL) 4 MG tablet Take 4 mg by mouth 2 (two) times daily.        Marland Kitchen HYDROcodone-acetaminophen (NORCO) 10-325 MG per tablet Take 1 tablet by mouth every 6 (six) hours as needed for pain.      Marland Kitchen insulin detemir (LEVEMIR) 100 UNIT/ML injection Inject 10-20 Units into the skin at bedtime. Takes only depeniding on blood sugar levels      . insulin lispro (HUMALOG) 100 UNIT/ML injection Inject 0-10 Units into the skin 3 (three) times daily before meals.  Pt is on a sliding scale      . isosorbide mononitrate (IMDUR) 60 MG 24 hr tablet TAKE ONE TABLET (60MG ) BY MOUTH DAILY  90 tablet  1  . latanoprost (XALATAN) 0.005 % ophthalmic solution Place 1 drop into both eyes at bedtime.        Marland Kitchen lisinopril (PRINIVIL,ZESTRIL) 20 MG tablet Take 20 mg by mouth daily.        . nitroGLYCERIN (NITROSTAT) 0.4 MG SL tablet Place 0.4 mg under the tongue every 5 (five) minutes as needed for chest pain.      . pantoprazole (PROTONIX) 40 MG tablet Take 40 mg by mouth daily.        . predniSONE (DELTASONE) 1 MG tablet Take 4 mg by mouth every morning.       . rosuvastatin (CRESTOR) 20 MG tablet Take 20 mg by mouth at bedtime.      . timolol (TIMOPTIC) 0.5 % ophthalmic solution Place 1 drop into both eyes daily.       . chlorthalidone (HYGROTON) 25 MG tablet Take 25 mg by mouth daily.      . potassium chloride SA (K-DUR,KLOR-CON) 20 MEQ tablet Take 20 mEq by mouth as needed.  No current facility-administered medications for this visit.    History   Social History  . Marital Status: Married    Spouse Name: N/A    Number of Children: N/A  . Years of Education: N/A   Occupational History  . Not on file.   Social History Main Topics  . Smoking status: Never Smoker   . Smokeless tobacco: Never Used  . Alcohol Use: No  . Drug Use: No  . Sexual Activity: No   Other Topics Concern  . Not on file   Social History Narrative  . No narrative on file    No family history on file.  Past Medical History  Diagnosis Date  . CAD (coronary artery disease)     Interventions in the past  . Groin hematoma     April, 2011  . Hypertension   . Diabetes mellitus   . Dyslipidemia   . GERD (gastroesophageal reflux disease)   . Schatzki's ring   . Renal insufficiency   . Polymyalgia rheumatica   . Osteoarthritis     arthroscopic surgery right knee February, 2012  . Spinal stenosis     history of surgery for this  . Ejection fraction     EF 65%, echo,  April, 2012, aortic valve sclerosis with very slight gradient  . Preoperative evaluation to rule out surgical contraindication     Patient needs back surgery by Dr. Carloyn Manner, May, 2012                . Pancreas cyst     The patient has multiple pancreatic cysts.  This is followed at [facility]         . Complication of anesthesia     "felt like I was smothering once with mask on my face"  . RBBB (right bundle branch block)     old  . Bradycardia, sinus   . Myocardial infarction 2011; 08/2010  . Shortness of breath on exertion   . Blood transfusion   . Anemia   . Stroke 01/18/11    "I've had 2 TIA's"  . Cancer     ""had hysterectomy for a 6 showing cancer; think that's pretty strong  . Aortic stenosis     very mild, echo 12/2010  . Femoral bruit     01/2011 hosp, but no pseudoaneurysm or AV fistula    Past Surgical History  Procedure Laterality Date  . Coronary angioplasty with stent placement  2011    "in [place]"  . Coronary angioplasty with stent placement  08/2010  . Cardiac catheterization  01/18/11  . Cholecystectomy  1987  . Appendectomy    . Back surgery    . Lumbar spine surgery  ~ 1977; ~ 2010    "2 hugh ruptured discs; I was paralyzed first OR; 2nd OR by Dr. Carloyn Manner, don't know what for"  . Tubal ligation  1970's  . Dilation and curettage of uterus    . Abdominal hysterectomy  ~ 1974  . Knee arthroscopy  02/2010    right knee; chrondoplasty medial femoral condyle;  Partial medial meniscectomy.     Patient Active Problem List   Diagnosis Date Noted  . Femoral bruit     Priority: High  . Shortness of breath on exertion   . Aortic stenosis   . Preoperative evaluation to rule out surgical contraindication   . Pancreas cyst   . CAD (coronary artery disease)   . RBBB (right bundle branch block)   . Groin  hematoma   . Hypertension   . Diabetes mellitus   . Dyslipidemia   . GERD (gastroesophageal reflux disease)   . Renal insufficiency   . Polymyalgia rheumatica   .  Bradycardia   . Ejection fraction     ROS   Patient denies fever, chills, headache, sweats, rash, change in vision, change in hearing, cough, nausea vomiting, urinary symptoms. All other systems are reviewed and are negative.  PHYSICAL EXAM  Today she is here with her youngest daughter area she is oriented to person time and place. Affect is normal. There is no jugulovenous distention. Lungs are clear. Respiratory effort is nonlabored. Cardiac exam her vitals S1 and S2. There no clicks. There is a soft systolic murmur. Abdomen is soft. She has no peripheral edema at this time. There no musculoskeletal deformities. There are no skin rashes.  Filed Vitals:   03/23/13 1114  BP: 163/80  Pulse: 52  Height: 5\' 3"  (1.6 m)  Weight: 144 lb (65.318 kg)  SpO2: 99%     ASSESSMENT & PLAN

## 2013-03-23 NOTE — Assessment & Plan Note (Signed)
The patient has significant groin bleed historically in 2011. This was not in the lab at [facility]

## 2013-03-23 NOTE — Assessment & Plan Note (Signed)
Her ejection fraction remains normal.

## 2013-03-23 NOTE — Assessment & Plan Note (Signed)
The patient has known coronary disease. She had drug-eluting stents placed elsewhere in the past. She had a drug-eluting stent placed in [place] in August, 2012. Catheterization in December, 2012 revealed patent stents. There was a small/moderate diagonal with a 90% stenosis that could cause some symptoms. She had been stable over time. Now she has increase in symptoms. She says that it feels like her original symptoms before her stents were placed. I decided to proceed with catheterization. She will have right and left heart cath. Aortic valve can be assessed further. We will assess her right heart pressures and her coronaries. Careful attention needs to be paid to reviewing her history concerning the catheterization sites.

## 2013-03-23 NOTE — Assessment & Plan Note (Signed)
She has aortic stenosis. It may have progressed some since 2012. However it is no more than moderate.

## 2013-03-23 NOTE — Assessment & Plan Note (Signed)
It appears that her exertional shortness of breath may be an anginal:. We will proceed with catheterization.  Today I spent greater than 25 minutes with the patient's total care. I reviewed her lab results and had a dense extensive discussion with the patient and her daughter. We carefully considered all options and decided to proceed with catheterization. I am making all of these arrangements.

## 2013-03-23 NOTE — Telephone Encounter (Signed)
Left and Right heart cath Thursday, March 25, 2013 @12 :00 noon with Dr. Angelena Form dx:SOB, CAD Checking percert .

## 2013-03-25 ENCOUNTER — Encounter (HOSPITAL_COMMUNITY): Payer: Self-pay | Admitting: General Practice

## 2013-03-25 ENCOUNTER — Encounter (HOSPITAL_COMMUNITY)
Admission: RE | Disposition: A | Discharge status: Home Health | Payer: Medicare Other | Source: Ambulatory Visit | Attending: Cardiovascular Disease

## 2013-03-25 ENCOUNTER — Inpatient Hospital Stay (HOSPITAL_COMMUNITY)
Admission: RE | Admit: 2013-03-25 | Discharge: 2013-04-01 | DRG: 246 | Disposition: A | Discharge status: Home Health | Payer: Medicare Other | Source: Ambulatory Visit | Attending: Cardiovascular Disease | Admitting: Cardiovascular Disease

## 2013-03-25 DIAGNOSIS — D62 Acute posthemorrhagic anemia: Secondary | ICD-10-CM | POA: Diagnosis not present

## 2013-03-25 DIAGNOSIS — E119 Type 2 diabetes mellitus without complications: Secondary | ICD-10-CM | POA: Diagnosis present

## 2013-03-25 DIAGNOSIS — M353 Polymyalgia rheumatica: Secondary | ICD-10-CM | POA: Diagnosis present

## 2013-03-25 DIAGNOSIS — I350 Nonrheumatic aortic (valve) stenosis: Secondary | ICD-10-CM

## 2013-03-25 DIAGNOSIS — Z7982 Long term (current) use of aspirin: Secondary | ICD-10-CM | POA: Diagnosis not present

## 2013-03-25 DIAGNOSIS — I252 Old myocardial infarction: Secondary | ICD-10-CM

## 2013-03-25 DIAGNOSIS — Z7902 Long term (current) use of antithrombotics/antiplatelets: Secondary | ICD-10-CM

## 2013-03-25 DIAGNOSIS — D696 Thrombocytopenia, unspecified: Secondary | ICD-10-CM | POA: Diagnosis present

## 2013-03-25 DIAGNOSIS — Z8673 Personal history of transient ischemic attack (TIA), and cerebral infarction without residual deficits: Secondary | ICD-10-CM

## 2013-03-25 DIAGNOSIS — N183 Chronic kidney disease, stage 3 unspecified: Secondary | ICD-10-CM | POA: Diagnosis present

## 2013-03-25 DIAGNOSIS — Z9861 Coronary angioplasty status: Secondary | ICD-10-CM | POA: Diagnosis not present

## 2013-03-25 DIAGNOSIS — R578 Other shock: Secondary | ICD-10-CM | POA: Diagnosis not present

## 2013-03-25 DIAGNOSIS — K219 Gastro-esophageal reflux disease without esophagitis: Secondary | ICD-10-CM | POA: Diagnosis present

## 2013-03-25 DIAGNOSIS — I129 Hypertensive chronic kidney disease with stage 1 through stage 4 chronic kidney disease, or unspecified chronic kidney disease: Secondary | ICD-10-CM | POA: Diagnosis present

## 2013-03-25 DIAGNOSIS — E785 Hyperlipidemia, unspecified: Secondary | ICD-10-CM | POA: Diagnosis present

## 2013-03-25 DIAGNOSIS — I4510 Unspecified right bundle-branch block: Secondary | ICD-10-CM | POA: Diagnosis present

## 2013-03-25 DIAGNOSIS — R031 Nonspecific low blood-pressure reading: Secondary | ICD-10-CM | POA: Diagnosis not present

## 2013-03-25 DIAGNOSIS — Z79899 Other long term (current) drug therapy: Secondary | ICD-10-CM

## 2013-03-25 DIAGNOSIS — I359 Nonrheumatic aortic valve disorder, unspecified: Secondary | ICD-10-CM | POA: Diagnosis not present

## 2013-03-25 DIAGNOSIS — I2584 Coronary atherosclerosis due to calcified coronary lesion: Secondary | ICD-10-CM | POA: Diagnosis present

## 2013-03-25 DIAGNOSIS — I200 Unstable angina: Secondary | ICD-10-CM | POA: Diagnosis not present

## 2013-03-25 DIAGNOSIS — S81009A Unspecified open wound, unspecified knee, initial encounter: Secondary | ICD-10-CM | POA: Diagnosis not present

## 2013-03-25 DIAGNOSIS — I2510 Atherosclerotic heart disease of native coronary artery without angina pectoris: Principal | ICD-10-CM | POA: Diagnosis present

## 2013-03-25 DIAGNOSIS — Z794 Long term (current) use of insulin: Secondary | ICD-10-CM | POA: Diagnosis not present

## 2013-03-25 DIAGNOSIS — Y840 Cardiac catheterization as the cause of abnormal reaction of the patient, or of later complication, without mention of misadventure at the time of the procedure: Secondary | ICD-10-CM | POA: Diagnosis not present

## 2013-03-25 DIAGNOSIS — K863 Pseudocyst of pancreas: Secondary | ICD-10-CM

## 2013-03-25 DIAGNOSIS — IMO0002 Reserved for concepts with insufficient information to code with codable children: Secondary | ICD-10-CM | POA: Diagnosis not present

## 2013-03-25 DIAGNOSIS — S31809A Unspecified open wound of unspecified buttock, initial encounter: Secondary | ICD-10-CM | POA: Diagnosis not present

## 2013-03-25 DIAGNOSIS — S301XXA Contusion of abdominal wall, initial encounter: Secondary | ICD-10-CM

## 2013-03-25 DIAGNOSIS — I10 Essential (primary) hypertension: Secondary | ICD-10-CM

## 2013-03-25 DIAGNOSIS — Y9210 Unspecified residential institution as the place of occurrence of the external cause: Secondary | ICD-10-CM | POA: Diagnosis not present

## 2013-03-25 DIAGNOSIS — R0602 Shortness of breath: Secondary | ICD-10-CM | POA: Diagnosis not present

## 2013-03-25 DIAGNOSIS — I498 Other specified cardiac arrhythmias: Secondary | ICD-10-CM | POA: Diagnosis present

## 2013-03-25 DIAGNOSIS — R001 Bradycardia, unspecified: Secondary | ICD-10-CM

## 2013-03-25 DIAGNOSIS — K862 Cyst of pancreas: Secondary | ICD-10-CM | POA: Diagnosis present

## 2013-03-25 HISTORY — PX: LEFT AND RIGHT HEART CATHETERIZATION WITH CORONARY ANGIOGRAM: SHX5449

## 2013-03-25 LAB — POCT I-STAT 3, ART BLOOD GAS (G3+)
Acid-Base Excess: 2 mmol/L (ref 0.0–2.0)
Bicarbonate: 26.3 mEq/L — ABNORMAL HIGH (ref 20.0–24.0)
O2 Saturation: 92 %
PO2 ART: 61 mmHg — AB (ref 80.0–100.0)
TCO2: 28 mmol/L (ref 0–100)
pCO2 arterial: 38.6 mmHg (ref 35.0–45.0)
pH, Arterial: 7.442 (ref 7.350–7.450)

## 2013-03-25 LAB — BASIC METABOLIC PANEL
BUN: 22 mg/dL (ref 6–23)
CALCIUM: 9.8 mg/dL (ref 8.4–10.5)
CO2: 26 mEq/L (ref 19–32)
Chloride: 99 mEq/L (ref 96–112)
Creatinine, Ser: 1.4 mg/dL — ABNORMAL HIGH (ref 0.50–1.10)
GFR calc Af Amer: 41 mL/min — ABNORMAL LOW (ref >90)
GFR, EST NON AFRICAN AMERICAN: 35 mL/min — AB (ref >90)
GLUCOSE: 147 mg/dL — AB (ref 70–99)
Potassium: 3.7 mEq/L (ref 3.7–5.3)
Sodium: 143 mEq/L (ref 137–147)

## 2013-03-25 LAB — POCT I-STAT 3, VENOUS BLOOD GAS (G3P V)
Acid-base deficit: 2 mmol/L (ref 0.0–2.0)
BICARBONATE: 23.1 meq/L (ref 20.0–24.0)
O2 Saturation: 69 %
PO2 VEN: 38 mmHg (ref 30.0–45.0)
TCO2: 24 mmol/L (ref 0–100)
pCO2, Ven: 40.8 mmHg — ABNORMAL LOW (ref 45.0–50.0)
pH, Ven: 7.361 — ABNORMAL HIGH (ref 7.250–7.300)

## 2013-03-25 LAB — PROTIME-INR
INR: 0.88 (ref 0.00–1.49)
Prothrombin Time: 11.8 seconds (ref 11.6–15.2)

## 2013-03-25 LAB — CBC
HCT: 38.8 % (ref 36.0–46.0)
HEMOGLOBIN: 13.2 g/dL (ref 12.0–15.0)
MCH: 30.5 pg (ref 26.0–34.0)
MCHC: 34 g/dL (ref 30.0–36.0)
MCV: 89.6 fL (ref 78.0–100.0)
PLATELETS: 127 10*3/uL — AB (ref 150–400)
RBC: 4.33 MIL/uL (ref 3.87–5.11)
RDW: 14.5 % (ref 11.5–15.5)
WBC: 8 10*3/uL (ref 4.0–10.5)

## 2013-03-25 LAB — GLUCOSE, CAPILLARY: Glucose-Capillary: 176 mg/dL — ABNORMAL HIGH (ref 70–99)

## 2013-03-25 LAB — POCT ACTIVATED CLOTTING TIME: Activated Clotting Time: 298 seconds

## 2013-03-25 SURGERY — LEFT AND RIGHT HEART CATHETERIZATION WITH CORONARY ANGIOGRAM
Anesthesia: LOCAL

## 2013-03-25 MED ORDER — SODIUM CHLORIDE 0.9 % IJ SOLN
3.0000 mL | Freq: Two times a day (BID) | INTRAMUSCULAR | Status: DC
  Administered 2013-03-25 – 2013-03-31 (×12): 3 mL via INTRAVENOUS

## 2013-03-25 MED ORDER — PREDNISONE 1 MG PO TABS
4.0000 mg | ORAL_TABLET | ORAL | Status: DC
  Administered 2013-03-26 – 2013-04-01 (×7): 4 mg via ORAL
  Filled 2013-03-25 (×9): qty 4

## 2013-03-25 MED ORDER — SODIUM CHLORIDE 0.9 % IV SOLN
INTRAVENOUS | Status: DC
Start: 2013-03-26 — End: 2013-03-25
  Administered 2013-03-25: 11:00:00 via INTRAVENOUS

## 2013-03-25 MED ORDER — FENTANYL CITRATE 0.05 MG/ML IJ SOLN
INTRAMUSCULAR | Status: AC
  Filled 2013-03-25: qty 2

## 2013-03-25 MED ORDER — CLOPIDOGREL BISULFATE 300 MG PO TABS
ORAL_TABLET | ORAL | Status: AC
  Filled 2013-03-25: qty 1

## 2013-03-25 MED ORDER — GLIMEPIRIDE 4 MG PO TABS
4.0000 mg | ORAL_TABLET | Freq: Two times a day (BID) | ORAL | Status: DC
  Administered 2013-03-25: 20:00:00 4 mg via ORAL
  Filled 2013-03-25 (×3): qty 1

## 2013-03-25 MED ORDER — SODIUM CHLORIDE 0.9 % IV SOLN: INTRAVENOUS | Status: AC

## 2013-03-25 MED ORDER — DIVALPROEX SODIUM ER 500 MG PO TB24
500.0000 mg | ORAL_TABLET | Freq: Every day | ORAL | Status: DC
  Administered 2013-03-27 – 2013-04-01 (×6): 500 mg via ORAL
  Filled 2013-03-25 (×8): qty 1

## 2013-03-25 MED ORDER — SODIUM CHLORIDE 0.9 % IJ SOLN: 3.0000 mL | INTRAMUSCULAR | Status: DC | PRN

## 2013-03-25 MED ORDER — ISOSORBIDE MONONITRATE ER 60 MG PO TB24
60.0000 mg | ORAL_TABLET | Freq: Every day | ORAL | Status: DC
  Filled 2013-03-25: qty 1

## 2013-03-25 MED ORDER — HYDROCODONE-ACETAMINOPHEN 10-325 MG PO TABS
1.0000 | ORAL_TABLET | Freq: Four times a day (QID) | ORAL | Status: DC | PRN
  Administered 2013-03-25 – 2013-04-01 (×21): 1 via ORAL
  Filled 2013-03-25 (×21): qty 1

## 2013-03-25 MED ORDER — TIMOLOL MALEATE 0.5 % OP SOLN
1.0000 [drp] | Freq: Every day | OPHTHALMIC | Status: DC
  Administered 2013-03-26 – 2013-04-01 (×7): 1 [drp] via OPHTHALMIC
  Filled 2013-03-25 (×2): qty 5

## 2013-03-25 MED ORDER — ASPIRIN 81 MG PO CHEW
81.0000 mg | CHEWABLE_TABLET | Freq: Every day | ORAL | Status: DC
  Administered 2013-03-26 – 2013-04-01 (×7): 81 mg via ORAL
  Filled 2013-03-25 (×8): qty 1

## 2013-03-25 MED ORDER — SODIUM CHLORIDE 0.9 % IV SOLN
250.0000 mL | INTRAVENOUS | Status: DC | PRN
Start: 2013-03-25 — End: 2013-03-25

## 2013-03-25 MED ORDER — NITROGLYCERIN 0.4 MG SL SUBL
0.4000 mg | SUBLINGUAL_TABLET | SUBLINGUAL | Status: DC | PRN
  Filled 2013-03-25: qty 1

## 2013-03-25 MED ORDER — MIDAZOLAM HCL 2 MG/2ML IJ SOLN
INTRAMUSCULAR | Status: AC
  Filled 2013-03-25: qty 2

## 2013-03-25 MED ORDER — CHLORTHALIDONE 25 MG PO TABS
25.0000 mg | ORAL_TABLET | Freq: Every day | ORAL | Status: DC
  Filled 2013-03-25: qty 1

## 2013-03-25 MED ORDER — SODIUM CHLORIDE 0.9 % IJ SOLN: 3.0000 mL | Freq: Two times a day (BID) | INTRAMUSCULAR | Status: DC

## 2013-03-25 MED ORDER — AMLODIPINE BESYLATE 10 MG PO TABS
10.0000 mg | ORAL_TABLET | Freq: Every day | ORAL | Status: DC
  Filled 2013-03-25: qty 1

## 2013-03-25 MED ORDER — LATANOPROST 0.005 % OP SOLN
1.0000 [drp] | Freq: Every day | OPHTHALMIC | Status: DC
  Administered 2013-03-25 – 2013-03-31 (×7): 1 [drp] via OPHTHALMIC
  Filled 2013-03-25: qty 2.5

## 2013-03-25 MED ORDER — FUROSEMIDE 40 MG PO TABS
40.0000 mg | ORAL_TABLET | Freq: Every day | ORAL | Status: DC
  Filled 2013-03-25 (×2): qty 1

## 2013-03-25 MED ORDER — NITROGLYCERIN 0.2 MG/ML ON CALL CATH LAB
INTRAVENOUS | Status: AC
  Filled 2013-03-25: qty 1

## 2013-03-25 MED ORDER — PANTOPRAZOLE SODIUM 40 MG PO TBEC
40.0000 mg | DELAYED_RELEASE_TABLET | Freq: Every day | ORAL | Status: DC
  Administered 2013-03-26 – 2013-04-01 (×7): 40 mg via ORAL
  Filled 2013-03-25 (×8): qty 1

## 2013-03-25 MED ORDER — DIAZEPAM 5 MG PO TABS
5.0000 mg | ORAL_TABLET | ORAL | Status: AC
  Administered 2013-03-25: 5 mg via ORAL
  Filled 2013-03-25: qty 1

## 2013-03-25 MED ORDER — HYDRALAZINE HCL 20 MG/ML IJ SOLN
INTRAMUSCULAR | Status: AC
  Filled 2013-03-25: qty 1

## 2013-03-25 MED ORDER — FENTANYL CITRATE 0.05 MG/ML IJ SOLN
25.0000 ug | INTRAMUSCULAR | Status: DC | PRN
  Administered 2013-03-25 – 2013-03-26 (×3): 25 ug via INTRAVENOUS
  Filled 2013-03-25 (×4): qty 2

## 2013-03-25 MED ORDER — LIDOCAINE HCL (PF) 1 % IJ SOLN
INTRAMUSCULAR | Status: AC
  Filled 2013-03-25: qty 30

## 2013-03-25 MED ORDER — FENTANYL CITRATE 0.05 MG/ML IJ SOLN
25.0000 ug | Freq: Once | INTRAMUSCULAR | Status: AC
  Administered 2013-03-25: 25 ug via INTRAVENOUS

## 2013-03-25 MED ORDER — HEPARIN (PORCINE) IN NACL 2-0.9 UNIT/ML-% IJ SOLN
INTRAMUSCULAR | Status: AC
  Filled 2013-03-25: qty 1000

## 2013-03-25 MED ORDER — BIVALIRUDIN 250 MG IV SOLR
INTRAVENOUS | Status: AC
  Filled 2013-03-25: qty 250

## 2013-03-25 MED ORDER — ASPIRIN 81 MG PO CHEW
81.0000 mg | CHEWABLE_TABLET | ORAL | Status: AC
  Administered 2013-03-25: 81 mg via ORAL
  Filled 2013-03-25: qty 1

## 2013-03-25 MED ORDER — NITROGLYCERIN IN D5W 200-5 MCG/ML-% IV SOLN
INTRAVENOUS | Status: AC
  Filled 2013-03-25: qty 250

## 2013-03-25 MED ORDER — SIMVASTATIN 20 MG PO TABS
20.0000 mg | ORAL_TABLET | Freq: Every day | ORAL | Status: DC
  Filled 2013-03-25 (×2): qty 1

## 2013-03-25 MED ORDER — CLOPIDOGREL BISULFATE 75 MG PO TABS
ORAL_TABLET | ORAL | Status: AC
  Filled 2013-03-25: qty 1

## 2013-03-25 MED ORDER — LISINOPRIL 20 MG PO TABS
20.0000 mg | ORAL_TABLET | Freq: Every day | ORAL | Status: DC
Start: 2013-03-26 — End: 2013-03-26
  Filled 2013-03-25: qty 1

## 2013-03-25 MED ORDER — CLOPIDOGREL BISULFATE 75 MG PO TABS
75.0000 mg | ORAL_TABLET | Freq: Every day | ORAL | Status: DC
  Administered 2013-03-26 – 2013-04-01 (×7): 75 mg via ORAL
  Filled 2013-03-25 (×8): qty 1

## 2013-03-25 NOTE — CV Procedure (Signed)
Cardiac Catheterization Operative Report  Sharon Hubbard [id number] 3/5/20151:58 PM Stoney Bang A, MD  Procedure Performed:  1. Left Heart Catheterization 2. Selective Coronary Angiography 3. Right Heart Catheterization 4. PTCA/DES x 1 proximal LAD  Operator: Lauree Chandler, MD  Indication:   80 yo female with history of CAD, HTN, HLD, DM with recent dyspnea and chest pain c/w unstable angina.                           Procedure Details: The risks, benefits, complications, treatment options, and expected outcomes were discussed with the patient. The patient and/or family concurred with the proposed plan, giving informed consent. The patient was brought to the cath lab after IV hydration was begun and oral premedication was given. The patient was further sedated with Versed and Fentanyl. The right groin was prepped and draped in the usual manner. Using the modified Seldinger access technique, a 5 French sheath was placed in the right femoral artery. A 7 French sheath was inserted into the right femoral vein. A balloon tipped catheter was used to perform a right heart catheterization. The left main was engaged with a JL4 catheter. The RCA was engaged with an AR-1 catheter. A pigtail catheter was used to measure LV pressures. She was found to have severe stenosis in the proximal LAD. I elected to proceed to PCI.   PCI Note: The patient was given a bolus of Angiomax and a drip was started. She was given Plavix 300 mg po x 1. I engaged the left main with a XB LAD 3.5 guiding catheter. When the ACT was over 200, I passed a Cougar IC wire down the LAD. I then pre-dilated the stenosis with a 2.5 x 12 mm balloon. I then carefully positioned and deployed a 3.0 x 24 mm Promus Premier DES in the proximal LAD. The stent was post-dilated with a  3.0 x 15 mm Geneva balloon. The stenosis was taken from 90% down to 0%.   There were no immediate complications. The patient was taken to the  recovery area in stable condition.   Hemodynamic Findings: Ao:   172/66            LV: 181/13/14 RA:   4        RV: 23/1/1 PA: 22/7 (mean 13) PCWP:  5 Fick Cardiac Output: 5.48 L/min Fick Cardiac Index: 3.22 L/min/m2 Central Aortic Saturation: 92% Pulmonary Artery Saturation: 69%   Angiographic Findings:  Left main:  No obstructive disease.   Left Anterior Descending Artery: Large caliber vessel that courses to the apex. The proximal vessel has an eccentric 90% stenosis. The mid stented segment is patent with minimal restenosis. The small caliber diagonal branch has 95% stenosis, unchanged from last cath.   Circumflex Artery: Large caliber vessel with small caliber first OM branch and moderate caliber second OM branch. The first OM branch has a focal 50% stenosis. The proximal AV groove Circumflex is heavily calcified.   Right Coronary Artery: Large, dominant vessel with 30% proximal stenosis, 60% calcified mid stenosis. The distal vessel has mild plaque.   Left Ventricular Angiogram: Deferred.   Impression: 1. Severe stenosis proximal LAD now s/p successful PTCA/DES x 1 proximal LAD 2. Severe stenosis in small caliber diagonal branch, too small for PCI 3. Moderate calcified stenosis mid RCA  Recommendations: She will need continued therapy with ASA and Plavix for at least one year but longer if she tolerates well.  Continue current therapy.        Complications:  None; patient tolerated the procedure well.

## 2013-03-25 NOTE — Progress Notes (Signed)
Site area: right groin  Site Prior to Removal:  Level 0  Pressure Applied For 20 MINUTES    Minutes Beginning at 1355  Manual:   yes  Patient Status During Pull:  stable  Post Pull Groin Site:  Level 1  Post Pull Instructions Given:  yes  Post Pull Pulses Present:  yes  Dressing Applied:  yes  Comments:  Arterial sheath pulled at 1355 and held for 10 minutes with no complications. Venous sheath pulled at 1405 and patient complained of pain with noted hematoma below site. Pressure held at cath site and below cath site where hematoma noted. Hematoma continued to slowly increase despite pressure held. Called for assistance at 1500 and area assessed by Donna Christen RN. Cath lab called for assistance and Cyndia Diver arrived to help at 1505. Eddie Dibbles into assist. 1605 pressure dressing dry and intact and area soft with no noted hematoma. Patient complains of pain when area palpated.

## 2013-03-25 NOTE — H&P (View-Only) (Signed)
Sharon Hubbard  History and physical for cardiac catheterization:   HPI  Patient is seen back today to followup coronary disease and exertional shortness of breath. I saw her last March 05, 2013. I adjusted her diuretics. She has less edema. She hasn't only as the day goes on now. However she continues to have very significant exertional shortness of breath. Her daughter is in the room and documents this clearly. She says that she feels like she did before her last coronary stent. She does get chest heaviness with her exertional shortness of breath.   At the time of her last visit I also arranged for a followup 2-D echo. Ejection fraction is 65%. She has mild to moderate aortic stenosis. This may be slightly worse than the past. I doubt it is causing her symptoms. Allergies   Allergen  Reactions   .  Clarithromycin         REACTION: Vomiting and diarrhea.   .  Codeine     .  Sulfa Antibiotics     .  Atorvastatin  Other (See Comments)       Made her knees buckle and muscles ache         Current Outpatient Prescriptions   Medication  Sig  Dispense  Refill   .  amLODipine (NORVASC) 10 MG tablet  Take 10 mg by mouth daily.           Marland Kitchen  aspirin 81 MG tablet  Take 81 mg by mouth daily.           .  cholecalciferol (VITAMIN D) 1000 UNITS tablet  Take 1,000 Units by mouth daily.         .  clopidogrel (PLAVIX) 75 MG tablet  TAKE ONE TABLET (75MG ) BY MOUTH DAILY   90 tablet   3   .  divalproex (DEPAKOTE) 500 MG 24 hr tablet  Take 500 mg by mouth daily.           .  furosemide (LASIX) 40 MG tablet  Take 1 tablet (40 mg total) by mouth daily.   90 tablet   0   .  glimepiride (AMARYL) 4 MG tablet  Take 4 mg by mouth 2 (two) times daily.           Marland Kitchen  HYDROcodone-acetaminophen (NORCO) 10-325 MG per tablet  Take 1 tablet by mouth every 6 (six) hours as needed for pain.         Marland Kitchen  insulin detemir (LEVEMIR) 100 UNIT/ML injection  Inject 10-20 Units into the skin at bedtime. Takes only  depeniding on blood sugar levels         .  insulin lispro (HUMALOG) 100 UNIT/ML injection  Inject 0-10 Units into the skin 3 (three) times daily before meals. Pt is on a sliding scale         .  isosorbide mononitrate (IMDUR) 60 MG 24 hr tablet  TAKE ONE TABLET (60MG ) BY MOUTH DAILY   90 tablet   1   .  latanoprost (XALATAN) 0.005 % ophthalmic solution  Place 1 drop into both eyes at bedtime.           Marland Kitchen  lisinopril (PRINIVIL,ZESTRIL) 20 MG tablet  Take 20 mg by mouth daily.           .  nitroGLYCERIN (NITROSTAT) 0.4 MG SL tablet  Place 0.4 mg under the tongue every 5 (five) minutes as needed for chest pain.         Marland Kitchen  pantoprazole (PROTONIX) 40 MG tablet  Take 40 mg by mouth daily.           .  predniSONE (DELTASONE) 1 MG tablet  Take 4 mg by mouth every morning.          .  rosuvastatin (CRESTOR) 20 MG tablet  Take 20 mg by mouth at bedtime.         .  timolol (TIMOPTIC) 0.5 % ophthalmic solution  Place 1 drop into both eyes daily.          .  chlorthalidone (HYGROTON) 25 MG tablet  Take 25 mg by mouth daily.         .  potassium chloride SA (K-DUR,KLOR-CON) 20 MEQ tablet  Take 20 mEq by mouth as needed.             No current facility-administered medications for this visit.         History       Social History   .  Marital Status:  Married       Spouse Name:  N/A       Number of Children:  N/A   .  Years of Education:  N/A       Occupational History   .  Not on file.       Social History Main Topics   .  Smoking status:  Never Smoker    .  Smokeless tobacco:  Never Used   .  Alcohol Use:  No   .  Drug Use:  No   .  Sexual Activity:  No       Other Topics  Concern   .  Not on file       Social History Narrative   .  No narrative on file        No family history on file.    Past Medical History   Diagnosis  Date   .  CAD (coronary artery disease)         Interventions in the past   .  Groin hematoma         April, 2011   .  Hypertension     .  Diabetes  mellitus     .  Dyslipidemia     .  GERD (gastroesophageal reflux disease)     .  Schatzki's ring     .  Renal insufficiency     .  Polymyalgia rheumatica     .  Osteoarthritis         arthroscopic surgery right knee February, 2012   .  Spinal stenosis         history of surgery for this   .  Ejection fraction         EF 65%, echo, April, 2012, aortic valve sclerosis with very slight gradient   .  Preoperative evaluation to rule out surgical contraindication         Patient needs back surgery by Dr. Carloyn Manner, May, 2012                 .  Pancreas cyst         The patient has multiple pancreatic cysts.  This is [facility] at [facility]          .  Complication of anesthesia         "felt like I was smothering once with mask on my face"   .  RBBB (right bundle branch block)  old   .  Bradycardia, sinus     .  Myocardial infarction  2011; 08/2010   .  Shortness of breath on exertion     .  Blood transfusion     .  Anemia     .  Stroke  01/18/11       "I've had 2 TIA's"   .  Cancer         ""had hysterectomy for a 6 showing cancer; think that's pretty strong   .  Aortic stenosis         very mild, echo 12/2010   .  Femoral bruit         01/2011 hosp, but no pseudoaneurysm or AV fistula         Past Surgical History   Procedure  Laterality  Date   .  Coronary angioplasty with stent placement    2011       "in [place]"   .  Coronary angioplasty with stent placement    08/2010   .  Cardiac catheterization    01/18/11   .  Cholecystectomy    1987   .  Appendectomy       .  Back surgery       .  Lumbar spine surgery    ~ 1977; ~ 2010       "2 hugh ruptured discs; I was paralyzed first OR; 2nd OR by Dr. Carloyn Manner, don't know what for"   .  Tubal ligation    1970's   .  Dilation and curettage of uterus       .  Abdominal hysterectomy    ~ 1974   .  Knee arthroscopy    02/2010       right knee; chrondoplasty medial femoral condyle;  Partial medial meniscectomy.          Patient  Active Problem List     Diagnosis  Date Noted   .  Femoral bruit         Priority: High   .  Shortness of breath on exertion     .  Aortic stenosis     .  Preoperative evaluation to rule out surgical contraindication     .  Pancreas cyst     .  CAD (coronary artery disease)     .  RBBB (right bundle branch block)     .  Groin hematoma     .  Hypertension     .  Diabetes mellitus     .  Dyslipidemia     .  GERD (gastroesophageal reflux disease)     .  Renal insufficiency     .  Polymyalgia rheumatica     .  Bradycardia     .  Ejection fraction          ROS    Patient denies fever, chills, headache, sweats, rash, change in vision, change in hearing, cough, nausea vomiting, urinary symptoms. All other systems are reviewed and are negative.   PHYSICAL EXAM  Today she is here with her youngest daughter area she is oriented to person time and place. Affect is normal. There is no jugulovenous distention. Lungs are clear. Respiratory effort is nonlabored. Cardiac exam her vitals S1 and S2. There no clicks. There is a soft systolic murmur. Abdomen is soft. She has no peripheral edema at this time. There no musculoskeletal deformities. There are no skin rashes.    Filed Vitals:  03/23/13 1114   BP:  163/80   Pulse:  52   Height:  5\' 3"  (1.6 m)   Weight:  144 lb (65.318 kg)   SpO2:  99%          ASSESSMENT & PLAN            CAD (coronary artery disease) - Carlena Bjornstad, MD at 03/23/2013 11:46 AM    Status: Written Related Problem: CAD (coronary artery disease)    The patient has known coronary disease. She had drug-eluting stents placed elsewhere in the past. She had a drug-eluting stent placed in [place] in August, 2012. Catheterization in December, 2012 revealed patent stents. There was a small/moderate diagonal with a 90% stenosis that could cause some symptoms. She had been stable over time. Now she has increase in symptoms. She says that it feels like her original  symptoms before her stents were placed. I decided to proceed with catheterization. She will have right and left heart cath. Aortic valve can be assessed further. We will assess her right heart pressures and her coronaries. Careful attention needs to be paid to reviewing her history concerning the catheterization sites.         Groin hematoma - Carlena Bjornstad, MD at 03/23/2013 11:46 AM    Status: Written Related Problem: Groin hematoma    The patient has significant groin bleed historically in 2011. This was not in the lab at [facility]         Hypertension - Carlena Bjornstad, MD at 03/23/2013 11:47 AM    Status: Written Related Problem: Hypertension    Systolic pressure is mildly elevated today. I've chosen not to change her medications further as of today. Further titration will be needed later.         Ejection fraction - Carlena Bjornstad, MD at 03/23/2013 11:47 AM    Status: Written Related Problem: Ejection fraction    Her ejection fraction remains normal.         Aortic stenosis - Carlena Bjornstad, MD at 03/23/2013 11:48 AM    Status: Written Related Problem: Aortic stenosis    She has aortic stenosis. It may have progressed some since 2012. However it is no more than moderate.         Shortness of breath on exertion - Carlena Bjornstad, MD at 03/23/2013 11:48 AM    Status: Written Related Problem: Shortness of breath on exertion    It appears that her exertional shortness of breath may be an anginal:. We will proceed with catheterization.   Today I spent greater than 25 minutes with the patient's total care. I reviewed her lab results and had a dense extensive discussion with the patient and her daughter. We carefully considered all options and decided to proceed with catheterization. I am making all of these arrangements.

## 2013-03-25 NOTE — Care Management Note (Addendum)
    Page 1 of 2   04/01/2013     2:17:04 PM   CARE MANAGEMENT NOTE 04/01/2013  Patient:  Sharon Hubbard, Sharon Hubbard   Account Number:  [id number]  Date Initiated:  03/25/2013  Documentation initiated by:  HUTCHINSON,CRYSTAL  Subjective/Objective Assessment:   cardiac cath with the diagnosis of Shortness of breath, chest pain, known CAD.     Action/Plan:   CM to follow for disposition needs   Anticipated DC Date:  04/01/2013   Anticipated DC Plan:  [place]  CM consult      [facility] Choice  HOME HEALTH   Choice offered to / List presented to:  C-1 Patient   DME arranged  Vassie Moselle      DME agency  [place] arranged  HH-1 RN  [place] [facility]      [facility] agency  [place]   Status of service:  Completed, signed off Medicare Important Message given?   (If response is "NO", the following Medicare IM given date fields will be blank) Date Medicare IM given:   Date Additional Medicare IM given:    Discharge Disposition:  [place]  Per UR Regulation:  Reviewed for med. necessity/level of care/duration of stay  If discussed at [place] of Stay Meetings, dates discussed:   03/30/2013  04/01/2013    Comments:  04/01/13 [place] FAXED F2F AND WOUND CARE ORDERS TO [place]. NOTIFIED [place].  START OF CARE 24-48H POST DC DATE.  03/31/13 Jermichael Belmares,RN,BSN [phone] PT FOR LIKELY DC HOME WITH DAUGHTER ON 3/12; WILL NEED HH FOLLOW UP AT DC.  PT LIVES IN [place]; PREFERS [facility] FOR HH FOLLOW UP.  FAXED REFERRAL TO [place]; FAX # [phone].  WILL NEED WOUND CARE ORDERS AND F2F DOCUMENTATION FOR MEDICARE PRIOR TO DC.  OPIB: Left Heart Cath 03/25/2013 Current  Recommendations: She will need continued therapy with ASA and Plavix for at least one year but longer if she tolerates well. Continue current therapy. Crystal Hutchinson RN,  BSN, [place], CCM 03/25/2013  Per Handoff:  ---03/25/2013 1426 by Louie Bun--- caTHED/ INTERVENTION/ OIB documented Crystal Hutchinson RN, BSN, [place], CCM 03/25/2013

## 2013-03-25 NOTE — Interval H&P Note (Signed)
History and Physical Interval Note:  03/25/2013 12:40 PM  Sharon Hubbard  has presented today for cardiac cath with the diagnosis of Shortness of breath, chest pain, known CAD.  The various methods of treatment have been discussed with the patient and family. After consideration of risks, benefits and other options for treatment, the patient has consented to  Procedure(s): LEFT AND RIGHT HEART CATHETERIZATION WITH CORONARY ANGIOGRAM (N/A) as a surgical intervention .  The patient's history has been reviewed, patient examined, no change in status, stable for surgery.  I have reviewed the patient's chart and labs.  Questions were answered to the patient's satisfaction.    Cath Lab Visit (complete for each Cath Lab visit)  Clinical Evaluation Leading to the Procedure:   ACS: no  Non-ACS:    Anginal Classification: CCS III  Anti-ischemic medical therapy: Maximal Therapy (2 or more classes of medications)  Non-Invasive Test Results: No non-invasive testing performed  Prior CABG: No previous CABG        MCALHANY,CHRISTOPHER

## 2013-03-25 NOTE — Progress Notes (Signed)
Called to assist with groin management. Raised moderately hard area noted below and lateral to insertion sites.Pressure to rt groin x 60 min. Pressure dressing applied. Site stable 10 min post dressing. Pt teaching done. Assisted by Dina Rich and Eddie Dibbles.

## 2013-03-26 ENCOUNTER — Encounter (HOSPITAL_COMMUNITY)
Admission: RE | Disposition: A | Discharge status: Home Health | Payer: Self-pay | Source: Ambulatory Visit | Attending: Cardiovascular Disease

## 2013-03-26 ENCOUNTER — Ambulatory Visit (HOSPITAL_COMMUNITY): Payer: Medicare Other

## 2013-03-26 ENCOUNTER — Inpatient Hospital Stay (HOSPITAL_COMMUNITY): Payer: Medicare Other | Admitting: Certified Registered"

## 2013-03-26 ENCOUNTER — Encounter (HOSPITAL_COMMUNITY): Payer: Self-pay | Admitting: Radiology

## 2013-03-26 ENCOUNTER — Encounter (HOSPITAL_COMMUNITY): Payer: Medicare Other | Admitting: Certified Registered"

## 2013-03-26 DIAGNOSIS — IMO0002 Reserved for concepts with insufficient information to code with codable children: Secondary | ICD-10-CM

## 2013-03-26 HISTORY — PX: FEMORAL ARTERY EXPLORATION: SHX5160

## 2013-03-26 LAB — HEMOGLOBIN AND HEMATOCRIT, BLOOD
HCT: 31 % — ABNORMAL LOW (ref 36.0–46.0)
HEMOGLOBIN: 10.4 g/dL — AB (ref 12.0–15.0)

## 2013-03-26 LAB — POCT I-STAT 4, (NA,K, GLUC, HGB,HCT)
Glucose, Bld: 284 mg/dL — ABNORMAL HIGH (ref 70–99)
HCT: 18 % — ABNORMAL LOW (ref 36.0–46.0)
HEMOGLOBIN: 6.1 g/dL — AB (ref 12.0–15.0)
Potassium: 3.7 mEq/L (ref 3.7–5.3)
Sodium: 141 mEq/L (ref 137–147)

## 2013-03-26 LAB — BASIC METABOLIC PANEL
BUN: 25 mg/dL — AB (ref 6–23)
CO2: 22 mEq/L (ref 19–32)
CREATININE: 1.66 mg/dL — AB (ref 0.50–1.10)
Calcium: 8.7 mg/dL (ref 8.4–10.5)
Chloride: 101 mEq/L (ref 96–112)
GFR, EST AFRICAN AMERICAN: 33 mL/min — AB (ref >90)
GFR, EST NON AFRICAN AMERICAN: 28 mL/min — AB (ref >90)
Glucose, Bld: 226 mg/dL — ABNORMAL HIGH (ref 70–99)
Potassium: 3.8 mEq/L (ref 3.7–5.3)
Sodium: 140 mEq/L (ref 137–147)

## 2013-03-26 LAB — CBC
HCT: 28.7 % — ABNORMAL LOW (ref 36.0–46.0)
HCT: 41 % (ref 36.0–46.0)
HEMOGLOBIN: 14.1 g/dL (ref 12.0–15.0)
Hemoglobin: 9.6 g/dL — ABNORMAL LOW (ref 12.0–15.0)
MCH: 30.1 pg (ref 26.0–34.0)
MCH: 30 pg (ref 26.0–34.0)
MCHC: 33.4 g/dL (ref 30.0–36.0)
MCHC: 34.4 g/dL (ref 30.0–36.0)
MCV: 90 fL (ref 78.0–100.0)
MCV: 87.2 fL (ref 78.0–100.0)
PLATELETS: 72 10*3/uL — AB (ref 150–400)
Platelets: 120 10*3/uL — ABNORMAL LOW (ref 150–400)
RBC: 3.19 MIL/uL — ABNORMAL LOW (ref 3.87–5.11)
RBC: 4.7 MIL/uL (ref 3.87–5.11)
RDW: 15 % (ref 11.5–15.5)
RDW: 15.7 % — ABNORMAL HIGH (ref 11.5–15.5)
WBC: 12.3 10*3/uL — ABNORMAL HIGH (ref 4.0–10.5)
WBC: 12.8 10*3/uL — AB (ref 4.0–10.5)

## 2013-03-26 LAB — GLUCOSE, CAPILLARY
GLUCOSE-CAPILLARY: 186 mg/dL — AB (ref 70–99)
GLUCOSE-CAPILLARY: 243 mg/dL — AB (ref 70–99)
GLUCOSE-CAPILLARY: 163 mg/dL — AB (ref 70–99)
GLUCOSE-CAPILLARY: 144 mg/dL — AB (ref 70–99)
Glucose-Capillary: 232 mg/dL — ABNORMAL HIGH (ref 70–99)
Glucose-Capillary: 142 mg/dL — ABNORMAL HIGH (ref 70–99)

## 2013-03-26 LAB — POCT I-STAT 7, (LYTES, BLD GAS, ICA,H+H)
BICARBONATE: 24.4 meq/L — AB (ref 20.0–24.0)
Calcium, Ion: 1.06 mmol/L — ABNORMAL LOW (ref 1.13–1.30)
HEMATOCRIT: 24 % — AB (ref 36.0–46.0)
HEMOGLOBIN: 8.2 g/dL — AB (ref 12.0–15.0)
O2 Saturation: 100 %
PH ART: 7.426 (ref 7.350–7.450)
PO2 ART: 498 mmHg — AB (ref 80.0–100.0)
Potassium: 3.6 mEq/L — ABNORMAL LOW (ref 3.7–5.3)
SODIUM: 142 meq/L (ref 137–147)
TCO2: 26 mmol/L (ref 0–100)
pCO2 arterial: 36.8 mmHg (ref 35.0–45.0)

## 2013-03-26 LAB — PREPARE RBC (CROSSMATCH)

## 2013-03-26 LAB — ABO/RH: ABO/RH(D): A POS

## 2013-03-26 LAB — MRSA PCR SCREENING: MRSA by PCR: NEGATIVE

## 2013-03-26 SURGERY — EXPLORATION, ARTERY, FEMORAL
Anesthesia: General | Site: Groin | Laterality: Right

## 2013-03-26 MED ORDER — ONDANSETRON HCL 4 MG/2ML IJ SOLN
INTRAMUSCULAR | Status: AC
  Filled 2013-03-26: qty 2

## 2013-03-26 MED ORDER — FENTANYL CITRATE 0.05 MG/ML IJ SOLN
25.0000 ug | Freq: Once | INTRAMUSCULAR | Status: AC
  Administered 2013-03-26: 25 ug via INTRAVENOUS

## 2013-03-26 MED ORDER — FENTANYL CITRATE 0.05 MG/ML IJ SOLN
INTRAMUSCULAR | Status: AC
  Filled 2013-03-26: qty 2

## 2013-03-26 MED ORDER — FENTANYL CITRATE 0.05 MG/ML IJ SOLN
INTRAMUSCULAR | Status: DC | PRN
  Administered 2013-03-26: 100 ug via INTRAVENOUS

## 2013-03-26 MED ORDER — ACETAMINOPHEN 160 MG/5ML PO SOLN
325.0000 mg | ORAL | Status: DC | PRN
  Filled 2013-03-26: qty 20.3

## 2013-03-26 MED ORDER — GLYCOPYRROLATE 0.2 MG/ML IJ SOLN
INTRAMUSCULAR | Status: AC
  Filled 2013-03-26: qty 2

## 2013-03-26 MED ORDER — ONDANSETRON HCL 4 MG/2ML IJ SOLN
4.0000 mg | Freq: Four times a day (QID) | INTRAMUSCULAR | Status: DC
  Administered 2013-03-26 – 2013-03-28 (×10): 4 mg via INTRAVENOUS
  Filled 2013-03-26 (×11): qty 2

## 2013-03-26 MED ORDER — FENTANYL CITRATE 0.05 MG/ML IJ SOLN: 25.0000 ug | INTRAMUSCULAR | Status: DC | PRN

## 2013-03-26 MED ORDER — ONDANSETRON HCL 4 MG/2ML IJ SOLN
4.0000 mg | Freq: Once | INTRAMUSCULAR | Status: AC
  Administered 2013-03-26: 4 mg via INTRAVENOUS

## 2013-03-26 MED ORDER — OXYCODONE HCL 5 MG PO TABS: 5.0000 mg | ORAL_TABLET | Freq: Once | ORAL | Status: AC | PRN

## 2013-03-26 MED ORDER — LIDOCAINE HCL 1 % IJ SOLN
INTRAMUSCULAR | Status: DC | PRN
  Administered 2013-03-26: 60 mg via INTRADERMAL

## 2013-03-26 MED ORDER — PHENYLEPHRINE HCL 10 MG/ML IJ SOLN
INTRAMUSCULAR | Status: DC | PRN
  Administered 2013-03-26: 40 ug via INTRAVENOUS
  Administered 2013-03-26: 80 ug via INTRAVENOUS

## 2013-03-26 MED ORDER — SODIUM CHLORIDE 0.9 % IV BOLUS (SEPSIS)
1000.0000 mL | Freq: Once | INTRAVENOUS | Status: AC
  Administered 2013-03-26: 1000 mL via INTRAVENOUS

## 2013-03-26 MED ORDER — HEMOSTATIC AGENTS (NO CHARGE) OPTIME
TOPICAL | Status: DC | PRN
  Administered 2013-03-26: 1 via TOPICAL

## 2013-03-26 MED ORDER — MIDAZOLAM HCL 2 MG/2ML IJ SOLN
INTRAMUSCULAR | Status: AC
  Filled 2013-03-26: qty 2

## 2013-03-26 MED ORDER — SUCCINYLCHOLINE CHLORIDE 20 MG/ML IJ SOLN
INTRAMUSCULAR | Status: DC | PRN
  Administered 2013-03-26: 60 mg via INTRAVENOUS

## 2013-03-26 MED ORDER — ONDANSETRON HCL 4 MG/2ML IJ SOLN: 4.0000 mg | Freq: Once | INTRAMUSCULAR | Status: AC | PRN

## 2013-03-26 MED ORDER — SODIUM CHLORIDE 0.9 % IV SOLN
INTRAVENOUS | Status: DC | PRN
  Administered 2013-03-26: 10:00:00 via INTRAVENOUS

## 2013-03-26 MED ORDER — 0.9 % SODIUM CHLORIDE (POUR BTL) OPTIME
TOPICAL | Status: DC | PRN
  Administered 2013-03-26: 3000 mL

## 2013-03-26 MED ORDER — INSULIN ASPART 100 UNIT/ML ~~LOC~~ SOLN
4.0000 [IU] | Freq: Three times a day (TID) | SUBCUTANEOUS | Status: DC
  Administered 2013-03-28 – 2013-03-31 (×11): 4 [IU] via SUBCUTANEOUS

## 2013-03-26 MED ORDER — INSULIN ASPART 100 UNIT/ML ~~LOC~~ SOLN
0.0000 [IU] | Freq: Three times a day (TID) | SUBCUTANEOUS | Status: DC
  Administered 2013-03-26: 5 [IU] via SUBCUTANEOUS
  Administered 2013-03-26: 2 [IU] via SUBCUTANEOUS
  Administered 2013-03-27: 3 [IU] via SUBCUTANEOUS
  Administered 2013-03-27: 2 [IU] via SUBCUTANEOUS
  Administered 2013-03-27: 3 [IU] via SUBCUTANEOUS
  Administered 2013-03-28: 8 [IU] via SUBCUTANEOUS
  Administered 2013-03-28 – 2013-03-29 (×3): 3 [IU] via SUBCUTANEOUS
  Administered 2013-03-29: 2 [IU] via SUBCUTANEOUS
  Administered 2013-03-30: 3 [IU] via SUBCUTANEOUS
  Administered 2013-03-30: 5 [IU] via SUBCUTANEOUS
  Administered 2013-03-30 – 2013-03-31 (×4): 3 [IU] via SUBCUTANEOUS
  Administered 2013-04-01: 2 [IU] via SUBCUTANEOUS
  Administered 2013-04-01: 3 [IU] via SUBCUTANEOUS

## 2013-03-26 MED ORDER — SODIUM CHLORIDE 0.9 % IV BOLUS (SEPSIS)
500.0000 mL | Freq: Once | INTRAVENOUS | Status: AC
  Administered 2013-03-26: 500 mL via INTRAVENOUS

## 2013-03-26 MED ORDER — MORPHINE SULFATE 2 MG/ML IJ SOLN
INTRAMUSCULAR | Status: AC
  Filled 2013-03-26: qty 1

## 2013-03-26 MED ORDER — SODIUM CHLORIDE 0.9 % IV SOLN
INTRAVENOUS | Status: DC
  Administered 2013-03-26: 10 mL/h via INTRAVENOUS

## 2013-03-26 MED ORDER — PROPOFOL 10 MG/ML IV BOLUS
INTRAVENOUS | Status: DC | PRN
  Administered 2013-03-26: 100 mg via INTRAVENOUS

## 2013-03-26 MED ORDER — DEXTROSE 5 % IV SOLN
1.5000 g | INTRAVENOUS | Status: DC | PRN
  Administered 2013-03-26: 1.5 g via INTRAVENOUS

## 2013-03-26 MED ORDER — ROSUVASTATIN CALCIUM 20 MG PO TABS
20.0000 mg | ORAL_TABLET | Freq: Every day | ORAL | Status: DC
  Administered 2013-03-26 – 2013-03-31 (×6): 20 mg via ORAL
  Filled 2013-03-26 (×7): qty 1

## 2013-03-26 MED ORDER — ONDANSETRON HCL 4 MG/2ML IJ SOLN
INTRAMUSCULAR | Status: DC | PRN
  Administered 2013-03-26: 4 mg via INTRAVENOUS

## 2013-03-26 MED ORDER — NEOSTIGMINE METHYLSULFATE 1 MG/ML IJ SOLN
INTRAMUSCULAR | Status: AC
  Filled 2013-03-26: qty 10

## 2013-03-26 MED ORDER — OXYCODONE HCL 5 MG/5ML PO SOLN: 5.0000 mg | Freq: Once | ORAL | Status: AC | PRN

## 2013-03-26 MED ORDER — ACETAMINOPHEN 325 MG PO TABS
325.0000 mg | ORAL_TABLET | ORAL | Status: DC | PRN
  Administered 2013-03-26 – 2013-03-27 (×2): 650 mg via ORAL
  Administered 2013-03-27: 325 mg via ORAL
  Administered 2013-03-28 – 2013-03-29 (×3): 650 mg via ORAL
  Filled 2013-03-26 (×5): qty 2
  Filled 2013-03-26: qty 1

## 2013-03-26 MED ORDER — PHENYLEPHRINE HCL 10 MG/ML IJ SOLN
10.0000 mg | INTRAVENOUS | Status: DC | PRN
  Administered 2013-03-26: 20 ug/min via INTRAVENOUS

## 2013-03-26 MED ORDER — SODIUM CHLORIDE 0.9 % IR SOLN
Status: DC | PRN
  Administered 2013-03-26: 11:00:00

## 2013-03-26 MED ORDER — MIDAZOLAM HCL 5 MG/5ML IJ SOLN
INTRAMUSCULAR | Status: DC | PRN
  Administered 2013-03-26: 1 mg via INTRAVENOUS

## 2013-03-26 MED ORDER — DOPAMINE-DEXTROSE 3.2-5 MG/ML-% IV SOLN
INTRAVENOUS | Status: AC
  Administered 2013-03-26: 2 ug/kg/min
  Filled 2013-03-26: qty 250

## 2013-03-26 MED ORDER — FENTANYL CITRATE 0.05 MG/ML IJ SOLN
INTRAMUSCULAR | Status: AC
  Filled 2013-03-26: qty 5

## 2013-03-26 MED ORDER — LACTATED RINGERS IV SOLN
INTRAVENOUS | Status: DC | PRN
  Administered 2013-03-26: 10:00:00 via INTRAVENOUS

## 2013-03-26 MED ORDER — INSULIN ASPART 100 UNIT/ML ~~LOC~~ SOLN
8.0000 [IU] | Freq: Once | SUBCUTANEOUS | Status: AC
  Administered 2013-03-26: 8 [IU] via SUBCUTANEOUS
  Filled 2013-03-26: qty 0.08

## 2013-03-26 MED ORDER — ETOMIDATE 2 MG/ML IV SOLN
INTRAVENOUS | Status: AC
  Filled 2013-03-26: qty 10

## 2013-03-26 MED FILL — Sodium Chloride IV Soln 0.9%: INTRAVENOUS | Qty: 50 | Status: AC

## 2013-03-26 SURGICAL SUPPLY — 64 items
BANDAGE ESMARK 6X9 LF (GAUZE/BANDAGES/DRESSINGS) IMPLANT
BENZOIN TINCTURE PRP APPL 2/3 (GAUZE/BANDAGES/DRESSINGS) ×3 IMPLANT
BNDG ESMARK 6X9 LF (GAUZE/BANDAGES/DRESSINGS)
CANISTER SUCTION 2500CC (MISCELLANEOUS) ×3 IMPLANT
CANNULA VESSEL 3MM 2 BLNT TIP (CANNULA) ×3 IMPLANT
CLIP LIGATING EXTRA MED SLVR (CLIP) ×3 IMPLANT
CLIP LIGATING EXTRA SM BLUE (MISCELLANEOUS) ×3 IMPLANT
CLIP TI MEDIUM 6 (CLIP) ×3 IMPLANT
CLIP TI WIDE RED SMALL 6 (CLIP) ×3 IMPLANT
CLOSURE WOUND 1/2 X4 (GAUZE/BANDAGES/DRESSINGS) ×1
COVER SURGICAL LIGHT HANDLE (MISCELLANEOUS) ×3 IMPLANT
CUFF TOURNIQUET SINGLE 34IN LL (TOURNIQUET CUFF) IMPLANT
CUFF TOURNIQUET SINGLE 44IN (TOURNIQUET CUFF) IMPLANT
DRAIN CHANNEL 10F 3/8 F FF (DRAIN) ×3 IMPLANT
DRAIN SNY 10X20 3/4 PERF (WOUND CARE) IMPLANT
DRAPE WARM FLUID 44X44 (DRAPE) ×3 IMPLANT
DRAPE X-RAY CASS 24X20 (DRAPES) IMPLANT
DRSG COVADERM 4X10 (GAUZE/BANDAGES/DRESSINGS) IMPLANT
DRSG COVADERM 4X8 (GAUZE/BANDAGES/DRESSINGS) IMPLANT
ELECT REM PT RETURN 9FT ADLT (ELECTROSURGICAL) ×3
ELECTRODE REM PT RTRN 9FT ADLT (ELECTROSURGICAL) ×1 IMPLANT
EVACUATOR SILICONE 100CC (DRAIN) ×6 IMPLANT
GLOVE ECLIPSE 7.5 STRL STRAW (GLOVE) ×3 IMPLANT
GLOVE SS BIOGEL STRL SZ 7.5 (GLOVE) ×1 IMPLANT
GLOVE SUPERSENSE BIOGEL SZ 7.5 (GLOVE) ×2
GOWN STRL REUS W/ TWL LRG LVL3 (GOWN DISPOSABLE) ×3 IMPLANT
GOWN STRL REUS W/TWL LRG LVL3 (GOWN DISPOSABLE) ×6
HEMOSTAT SNOW SURGICEL 2X4 (HEMOSTASIS) ×3 IMPLANT
INSERT FOGARTY SM (MISCELLANEOUS) IMPLANT
KIT BASIN OR (CUSTOM PROCEDURE TRAY) ×3 IMPLANT
KIT ROOM TURNOVER OR (KITS) ×3 IMPLANT
NS IRRIG 1000ML POUR BTL (IV SOLUTION) ×6 IMPLANT
PACK PERIPHERAL VASCULAR (CUSTOM PROCEDURE TRAY) ×3 IMPLANT
PAD ARMBOARD 7.5X6 YLW CONV (MISCELLANEOUS) ×6 IMPLANT
PADDING CAST COTTON 6X4 STRL (CAST SUPPLIES) IMPLANT
SET COLLECT BLD 21X3/4 12 (NEEDLE) IMPLANT
SPONGE GAUZE 4X4 12PLY (GAUZE/BANDAGES/DRESSINGS) ×3 IMPLANT
SPONGE LAP 18X18 X RAY DECT (DISPOSABLE) ×3 IMPLANT
STAPLER VISISTAT 35W (STAPLE) ×3 IMPLANT
STOPCOCK 4 WAY LG BORE MALE ST (IV SETS) IMPLANT
STRIP CLOSURE SKIN 1/2X4 (GAUZE/BANDAGES/DRESSINGS) ×2 IMPLANT
SUT ETHILON 2 0 FS 18 (SUTURE) ×3 IMPLANT
SUT ETHILON 3 0 PS 1 (SUTURE) IMPLANT
SUT PROLENE 5 0 C 1 24 (SUTURE) ×6 IMPLANT
SUT PROLENE 5 0 C 1 36 (SUTURE) ×3 IMPLANT
SUT PROLENE 6 0 BV (SUTURE) ×3 IMPLANT
SUT PROLENE 6 0 C 1 24 (SUTURE) ×3 IMPLANT
SUT PROLENE 6 0 CC (SUTURE) ×3 IMPLANT
SUT SILK 2 0 SH (SUTURE) ×3 IMPLANT
SUT VIC AB 2-0 CT1 27 (SUTURE) ×2
SUT VIC AB 2-0 CT1 TAPERPNT 27 (SUTURE) ×1 IMPLANT
SUT VIC AB 2-0 CTX 36 (SUTURE) ×6 IMPLANT
SUT VIC AB 2-0 SH 18 (SUTURE) ×3 IMPLANT
SUT VIC AB 3-0 SH 27 (SUTURE) ×6
SUT VIC AB 3-0 SH 27X BRD (SUTURE) ×3 IMPLANT
SUT VIC AB 3-0 SH 8-18 (SUTURE) ×3 IMPLANT
SUT VICRYL 4-0 PS2 18IN ABS (SUTURE) ×3 IMPLANT
TAPE CLOTH SURG 4X10 WHT LF (GAUZE/BANDAGES/DRESSINGS) ×3 IMPLANT
TOWEL OR 17X24 6PK STRL BLUE (TOWEL DISPOSABLE) ×6 IMPLANT
TOWEL OR 17X26 10 PK STRL BLUE (TOWEL DISPOSABLE) ×6 IMPLANT
TRAY FOLEY CATH 16FRSI W/METER (SET/KITS/TRAYS/PACK) ×3 IMPLANT
TUBING EXTENTION W/L.L. (IV SETS) IMPLANT
UNDERPAD 30X30 INCONTINENT (UNDERPADS AND DIAPERS) ×3 IMPLANT
WATER STERILE IRR 1000ML POUR (IV SOLUTION) ×3 IMPLANT

## 2013-03-26 NOTE — Transfer of Care (Signed)
Immediate Anesthesia Transfer of Care Note  Patient: Sharon Hubbard  Procedure(s) Performed: Procedure(s): FEMORAL ARTERY EXPLORATION WITH SUTURE REPAIR; EVACUATION OF HEMATOMA (Right)  Patient Location: PACU  Anesthesia Type:General  Level of Consciousness: awake, alert , oriented and patient cooperative  Airway & Oxygen Therapy: Patient Spontanous Breathing and Patient connected to nasal cannula oxygen  Post-op Assessment: Report given to PACU RN, Post -op Vital signs reviewed and stable and Patient moving all extremities  Post vital signs: Reviewed and stable  Complications: No apparent anesthesia complications

## 2013-03-26 NOTE — Progress Notes (Signed)
Pt.  became hot and nauseated several minutes after manual pressure was applied to vascular site. RN to beside. Pt. Pale, diaphoretic, alert, and answering questions appropriately. RN instructed pt to keep taking slow deep breaths. Pt. Suddenly became unresponsive with a fixed downward gaze. Sternum rub immediately performed and patient became responsive again. BP dropped to 68/47(51).  HR remained within normal limits. Zoll now at beside with pads applied to patient. EKG performed. Zofran given. Pt. Alert  and oriented, complaining of chest pain. Cards PA at bedside.

## 2013-03-26 NOTE — Progress Notes (Signed)
    Subjective:  Denies CP; mild epigastric pain; nausea; no dyspnea   Objective:  Filed Vitals:   03/26/13 0700 03/26/13 0715 03/26/13 0730 03/26/13 0734  BP: 86/54 92/32 77/28  79/39  Pulse: 76 76 81 82  Temp:      TempSrc:      Resp: 17 8 16 17   Height:      Weight:      SpO2: 100% 100% 100% 100%    Intake/Output from previous day:  Intake/Output Summary (Last 24 hours) at 03/26/13 0737 Last data filed at 03/25/13 2145  Gross per 24 hour  Intake    340 ml  Output    850 ml  Net   -510 ml    Physical Exam: Physical exam: Well-developed well-nourished in no acute distress.  Skin is warm and dry.  HEENT is normal.  Neck is supple.  Chest is clear to auscultation with normal expansion.  Cardiovascular exam is regular rate and rhythm. 2/6 systolic murmur LSB Abdominal exam nontender or distended. No masses palpated. Right groin with large hematoma extending to mid thigh and back; ecchymosis Extremities show no edema. 2+ DP on right neuro grossly intact    Lab Results: Basic Metabolic Panel:  Recent Labs  03/25/13 1036  NA 143  K 3.7  CL 99  CO2 26  GLUCOSE 147*  BUN 22  CREATININE 1.40*  CALCIUM 9.8   CBC:  Recent Labs  03/25/13 1036 03/26/13 0519  WBC 8.0  --   HGB 13.2 10.4*  HCT 38.8 31.0*  MCV 89.6  --   PLT 127*  --      Assessment/Plan:  1 CAD-status post PCI of LAD-continue aspirin, Plavix and statin. 2 groin hematoma-patient has a large hematoma. CT scan shows no retroperitoneal bleed. Patient's blood pressure decreased this morning. She was given a 500 cc bolus of normal saline with improvement. Hold all antihypertensives today. Follow hemoglobin and transfuse as needed. We'll continue bedrest for now. She appears to have stopped bleeding at this point. If bleeding recurs may need surgical consult. 3 chronic kidney disease-patient received contrast with her CT scan as well as catheterization yesterday. She has also been hypotensive.  High risk of ATN. Follow renal function closely. 4 mild to moderate aortic stenosis. 5 pancreatic cyst-this has been noted previously. She will need followup studies as an outpatient. 6 diabetes mellitus-hold oral hypoglycemics today as we will keep her n.p.o. for now in case surgical intervention required. 7 hypertension-we are holding antihypertensives for now. We will resume as blood pressure stabilizes. 8 hyperlipidemia-continue statin. Kirk Ruths 03/26/2013, 7:37 AM

## 2013-03-26 NOTE — Anesthesia Postprocedure Evaluation (Signed)
  Anesthesia Post-op Note  Patient: Sharon Hubbard  Procedure(s) Performed: Procedure(s): FEMORAL ARTERY EXPLORATION WITH SUTURE REPAIR; EVACUATION OF HEMATOMA (Right)  Patient Location: PACU  Anesthesia Type:General  Level of Consciousness: awake, alert  and oriented  Airway and Oxygen Therapy: Patient Spontanous Breathing and Patient connected to nasal cannula oxygen  Post-op Pain: mild  Post-op Assessment: Post-op Vital signs reviewed, Patient's Cardiovascular Status Stable, Respiratory Function Stable, Patent Airway, No signs of Nausea or vomiting and Pain level controlled  Post-op Vital Signs: Reviewed and stable  Complications: No apparent anesthesia complications

## 2013-03-26 NOTE — Progress Notes (Signed)
0852 Called to room by pt who reports extreme nausea. Dr Stanford Breed notified, 4mg  Zofran given. Groin site assessed and manual pressure held by Dr Stanford Breed. Pt reporting severe pain, anxious, attempting to verbally soothe.  Vascular surgeon into assess pt who began reporting increased back pain and was very resistant to pressure being held. 0928- SBP drop to 60's. Dr Stanford Breed back to room. Manual pressure continues to be held. IL NS bolus and Dopamine started at 33mcg/kg/min. Hematoma has not grown during this time. 0933- OR staff in room, took over holding pressure. SBP increasing.  0950- To OR

## 2013-03-26 NOTE — Progress Notes (Addendum)
03/26/13 0400 03/26/13 0411 03/26/13 0412  Vitals  BP ! 147/87 mmHg 120/64 mmHg --   MAP (mmHg) 109 83 --   ECG Heart Rate 85 ! 102 94   Pt's daughter called RN for pt requesting pain medication.  RN went to room immediately, heard pt moaning.  Groin noted to have grown considerably and was much firmer than previous assessment.  Pt states she just moved in bed and pain was sudden onset.  Manual pressure held both above and below puncture site since pt had both arterial and venous sticks.  Pt moaned loudly and crying but hematoma appeared to stop growing.  Dr Jules Husbands called, manual pressure held until his arrival w/in 5 minutes.  Upon his assessment, he advised to stop holding pressure and not to apply femostop due to severe pain.  Hematoma visibly growing posteriorly and laterially into hip.  Pt received 50 mcg Fentanyl IV with some relief.  Hematoma has extended superior to puncture site now. BP 147/87 and HR 100's during manual hold, BP down to 120/64 and HR back down to 80's after pressure released and pt able to relax some.  Taken to CT per bed, care transferred to [facility] from [facility].  Slid over to CT table and hematoma acutely grew again visibly, this time at inner thigh area.  Pt's daughter updated w/ plan, taken all pt's valuables to [place] waiting room, Jarrett Soho aware.

## 2013-03-26 NOTE — OR Nursing (Signed)
Patient brought emergently to operating room. Verbal and armband ID performed. No other pre procedure verification. Emergency procedure.

## 2013-03-26 NOTE — Op Note (Signed)
OPERATIVE REPORT  Date of Surgery: 03/25/2013 - 03/26/2013  Surgeon: Tinnie Gens, MD  Assistant: Gerri Lins PA  Pre-op Diagnosis: Expanding hematoma right inguinal area post cardiac catheterization and intervention 24 hours previously  Post-op Diagnosis: Bleeding from puncture site right common femoral artery with extensive right lateral buttock and anterior thigh hematoma  Procedure: Procedure(s): FEMORAL ARTERY EXPLORATION WITH SUTURE REPAIR; EVACUATION OF HEMATOMA  Anesthesia: General  EBL: 100 cc in the OR-proximally 3 units of hematoma evacuated  Complications: None  Patient was taken urgently to the operating room with expanding hematoma right inguinal area having undergone cardiac catheterization with intervention 24 hours earlier. Patient's blood pressure was approximately 80 systolic on arriving the OR. Right inguinal area was then prepped and draped in routine sterile manner holding compression in the right femoral triangle. Longitudinal incision was then made through the inguinal area carried down through subcutaneous and immediately we encountered a large hematoma which had dissected laterally and posteriorly over the lateral thigh and gluteus muscles. It also dissected anteriorly in the thigh. Proximally 2-3 units of hematoma was evacuated from these areas. Dissection then continued down to the femoral vessels. Superficial femoral artery was initially identified and circled with a vessel loop and I dissected proximally until the puncture site was identified. It was in the distal common femoral artery. A clean puncture anteriorly. Most of the active bleeding had stopped but he was continuing to have some hemorrhage which increased when we manipulated the vessel. This was repaired with 3 interrupted 6-0 Prolene sutures with no further bleeding. Artery was dissected up to anything ligament up to the external iliac artery there was no sign of hematoma or bleeding in this area. The  superficial femoral and profunda had excellent pulses palpable. The hematoma both posterior laterally and anteriorly was evacuated as much as possible thirty-year grade with saline there was no further active bleeding and a Hemovac drain was brought out through an inferiorly based stab wound and secured with a nylon suture. The wound was then closed with interrupted 2-0 Vicryl for the deep layers interrupted 3-0 Vicryl for the subcutaneous layers and staples for the skin sterile dressing applied the patient received 2 units of packed red blood cells during the operation and a third unit to be given in the PACUs:She was hemoclipped dynamically stable throughout the procedure after the initial hypotension was treated with transfusion but blood pressure remained at least 80 systolic throughout. This was taken to the recovery room in stable condition    Tinnie Gens, MD 03/26/2013 11:12 AM

## 2013-03-26 NOTE — Consult Note (Addendum)
[facility] [place] HISTORY AND PHYSICAL   History of Present Illness:  Patient is a 80 y.o. year old female who presents for evaluation of expanding hematoma right groin.  She had cardiac cath yesterday and LAD intervention.  She had a small hematoma this morning then developed nausea vomiting related to either hypotension or narcotic.  Hematoma then expanded significantly.  BP improved with saline bolus.  She is on Plavix/ASA.  Other medical problems include diabetes, hyperlipidemia renal insufficiency all currently stable.  Past Medical History  Diagnosis Date  . CAD (coronary artery disease)     Interventions in the past  . Groin hematoma     April, 2011  . Hypertension   . Diabetes mellitus   . Dyslipidemia   . GERD (gastroesophageal reflux disease)   . Schatzki's ring   . Renal insufficiency   . Polymyalgia rheumatica   . Osteoarthritis     arthroscopic surgery right knee February, 2012  . Spinal stenosis     history of surgery for this  . Ejection fraction     EF 65%, echo, April, 2012, aortic valve sclerosis with very slight gradient  . Preoperative evaluation to rule out surgical contraindication     Patient needs back surgery by Dr. Carloyn Manner, May, 2012                . Pancreas cyst     The patient has multiple pancreatic cysts.  This is followed at [facility]         . Complication of anesthesia     "felt like I was smothering once with mask on my face"  . RBBB (right bundle branch block)     old  . Bradycardia, sinus   . Myocardial infarction 2011; 08/2010  . Shortness of breath on exertion   . Blood transfusion   . Anemia   . Stroke 01/18/11    "I've had 2 TIA's"  . Cancer     ""had hysterectomy for a 6 showing cancer; think that's pretty strong  . Aortic stenosis     very mild, echo 12/2010  . Femoral bruit     01/2011 hosp, but no pseudoaneurysm or AV fistula    Past Surgical History  Procedure Laterality Date  . Cholecystectomy  1987  .  Appendectomy    . Back surgery    . Lumbar spine surgery  ~ 1977; ~ 2010    "2 hugh ruptured discs; I was paralyzed first OR; 2nd OR by Dr. Carloyn Manner, don't know what for"  . Tubal ligation  1970's  . Dilation and curettage of uterus    . Abdominal hysterectomy  ~ 1974  . Knee arthroscopy  02/2010    right knee; chrondoplasty medial femoral condyle;  Partial medial meniscectomy.   . Coronary angioplasty with stent placement  2011    "in [place]"  . Coronary angioplasty with stent placement  08/2010; 03/25/2013  . Cardiac catheterization  01/18/11    Social History History  Substance Use Topics  . Smoking status: Never Smoker   . Smokeless tobacco: Never Used  . Alcohol Use: No    Family History No family history on file.  Allergies  Allergies  Allergen Reactions  . Clarithromycin     REACTION: Vomiting and diarrhea.  . Codeine   . Sulfa Antibiotics   . Atorvastatin Other (See Comments)    Made her knees buckle and muscles ache     Current Facility-Administered Medications  Medication  Dose Route Frequency Provider Last Rate Last Dose  . aspirin chewable tablet 81 mg  81 mg Oral Daily Burnell Blanks, MD      . clopidogrel (PLAVIX) tablet 75 mg  75 mg Oral Q breakfast Burnell Blanks, MD   75 mg at 03/26/13 0840  . divalproex (DEPAKOTE ER) 24 hr tablet 500 mg  500 mg Oral Daily Burnell Blanks, MD      . fentaNYL (SUBLIMAZE) injection 25 mcg  25 mcg Intravenous Q4H PRN Jules Husbands, MD   25 mcg at 03/26/13 0806  . HYDROcodone-acetaminophen (NORCO) 10-325 MG per tablet 1 tablet  1 tablet Oral Q6H PRN Burnell Blanks, MD   1 tablet at 03/25/13 2028  . insulin aspart (novoLOG) injection 0-15 Units  0-15 Units Subcutaneous TID WC Perry Mount, PA-C   5 Units at 03/26/13 [id number]  . insulin aspart (novoLOG) injection 4 Units  4 Units Subcutaneous TID WC Perry Mount, PA-C      . latanoprost (XALATAN) 0.005 % ophthalmic solution 1 drop  1 drop Both Eyes QHS  Burnell Blanks, MD   1 drop at 03/25/13 2316  . nitroGLYCERIN (NITROSTAT) SL tablet 0.4 mg  0.4 mg Sublingual Q5 min PRN Burnell Blanks, MD      . ondansetron [facility]) 4 MG/2ML injection           . ondansetron (ZOFRAN) injection 4 mg  4 mg Intravenous 4 times per day Perry Mount, PA-C   4 mg at 03/26/13 0641  . pantoprazole (PROTONIX) EC tablet 40 mg  40 mg Oral Daily Burnell Blanks, MD      . predniSONE (DELTASONE) tablet 4 mg  4 mg Oral BH-q7a Burnell Blanks, MD      . simvastatin (ZOCOR) tablet 20 mg  20 mg Oral q1800 Burnell Blanks, MD      . sodium chloride 0.9 % injection 3 mL  3 mL Intravenous Q12H Burnell Blanks, MD   3 mL at 03/25/13 2304  . timolol (TIMOPTIC) 0.5 % ophthalmic solution 1 drop  1 drop Both Eyes Daily Burnell Blanks, MD        Physical Examination  Filed Vitals:   03/26/13 0730 03/26/13 0734 03/26/13 0746 03/26/13 0800  BP: 77/28 79/39 107/48 91/45  Pulse: 81 82 80 73  Temp:   97.6 F (36.4 C)   TempSrc:   Oral   Resp: 16 17 16  0  Height:      Weight:      SpO2: 100% 100% 100% 100%    Body mass index is 25.2 kg/(m^2).  General:  Alert and oriented, complains of right groin pain HEENT: Normal Abdomen: Soft, non-tender, non-distended, obese Skin: No rash, peau dorange with ecchymosis right anterior thigh and groin, hematoma 7 x 5 cm non pulsatile Neurologic: Upper and lower extremity motor 5/5 and symmetric  DATA:   CBC    Component Value Date/Time   WBC 12.3* 03/26/2013 0715   RBC 3.19* 03/26/2013 0715   HGB 9.6* 03/26/2013 0715   HCT 28.7* 03/26/2013 0715   PLT 120* 03/26/2013 0715   MCV 90.0 03/26/2013 0715   MCH 30.1 03/26/2013 0715   MCHC 33.4 03/26/2013 0715   RDW 15.0 03/26/2013 0715   LYMPHSABS 3.2 12/25/2012 1711   MONOABS 0.7 12/25/2012 1711   EOSABS 0.1 12/25/2012 1711   BASOSABS 0.0 12/25/2012 1711       ASSESSMENT:  Expanding hematoma with skin compromise right groin  possible ongoing  bleeding   PLAN:  To OR for exploration of groin evacuation of hematoma and control of bleeding.  Procedure risk benefit discussed with pt and son.  Type and screen sent.  Ruta Hinds, MD [facility] [place] Office: [phone] Pager: [phone]

## 2013-03-26 NOTE — Progress Notes (Addendum)
Brief X-Cover Note  Ms. Hakala is a 80 yo woman who had a right/left heart catheterization earlier today with sheath pull this afternoon with some bleeding and associated hematoma noted per RN. She required some fentanyl for pain earlier in the evening but per RN had stable hematoma. Around 04:000, Ms Nowotny moved in bed and the primary RN noted expanding thigh hematoma beyond previous marking. On my arrival, Ms. Rochette intermittently moaning (while primary RN was putting pressure) and then visibly uncomfortable/in pain. Hemodynamically, vitals reviewed; BP 114/50  Pulse 67  Temp(Src) 98 F (36.7 C) (Oral)  Resp 18  Ht 5' 3.5" (1.613 m)  Wt 65.318 kg (144 lb)  BMI 25.11 kg/m2  SpO2 92% Tachycardic with pressure by RN, no overt murmur, lungs fairly CTA anteriorly. Right thigh with visible bruising, palpable hematoma - all below inguinal ligament but expanding laterally and posteriorly ~ 8-10 cm x 6 cm. Her daughter was at bedside and I discussed importance of confirming this is a superficial bleed versus a more life-threatening retro-peritoneal bleed. I arranged transfer to the CCU and I discussed with radiology and we will obtain a CT abd/pelvis without contrast to evaluate for RP bleed. Although we will not easily identify a pseudoaneursym, this would not be causing a life-threatening bleed and the risk of contrast after LHC is higher given her GFR of 40. Patient and family updated. Transferring to CCU stat by way of CT scan. Type and cross, repeat h/h now. Potential transfusion.   Jules Husbands, MD  CT scan obtained; with findings verbally notified to me by the radiologist...and official read, "large right anterior thigh hematoma which measures 11 x 6 x 17 cm. This is superficial to the right calf musculature without  deep thigh hematoma noted. No evidence of retroperitoneal hematoma. Also noted is projecting anteriorly from the pancreatic neck region is a 3 x 2 x 2 cm cystic lesion. This  may represent an intra papillary mucinous  tumor although incompletely assessed. Follow-up CT in 6 months recommended for further delineation"  I have updated the family and re-evaluated the patient at bedside. H/H pending, type and screen sent, vitals reviewed again, BP 107/59  Pulse 79  Temp(Src) 97.4 F (36.3 C) (Oral)  Resp 12  Ht 5\' 3"  (1.6 m)  Wt 64.5 kg (142 lb 3.2 oz)  BMI 25.20 kg/m2  SpO2 99%. Will continue pain control and Ms. Worsley will potentially need some blood. She does not have a retroperitoneal bleed and the bleed appears to be superficial. I will also notify the incoming team at the time that I leave shortly after 6 AM.   Jules Husbands, MD

## 2013-03-26 NOTE — Anesthesia Preprocedure Evaluation (Signed)
Anesthesia Evaluation  Patient identified by MRN, date of birth, ID band Patient awake    Reviewed: Allergy & Precautions, H&P , NPO status , Patient's Chart, lab work & pertinent test results, reviewed documented beta blocker date and time   Airway Mallampati: II TM Distance: >3 FB     Dental  (+) Teeth Intact   Pulmonary          Cardiovascular hypertension,     Neuro/Psych CVA    GI/Hepatic GERD-  ,  Endo/Other  diabetes, Type 2  Renal/GU Renal InsufficiencyRenal disease     Musculoskeletal   Abdominal   Peds  Hematology  (+) anemia ,   Anesthesia Other Findings   Reproductive/Obstetrics                           Anesthesia Physical Anesthesia Plan  ASA: III  Anesthesia Plan: General   Post-op Pain Management:    Induction: Intravenous  Airway Management Planned: Oral ETT  Additional Equipment: Arterial line  Intra-op Plan:   Post-operative Plan: Extubation in OR and Possible Post-op intubation/ventilation  Informed Consent:   Dental advisory given and Only emergency history available  Plan Discussed with: Anesthesiologist and Surgeon  Anesthesia Plan Comments:         Anesthesia Quick Evaluation

## 2013-03-26 NOTE — Anesthesia Procedure Notes (Signed)
Procedure Name: Intubation Date/Time: 03/26/2013 10:07 AM Performed by: Julian Reil Pre-anesthesia Checklist: Patient identified, Emergency Drugs available, Suction available and Patient being monitored Patient Re-evaluated:Patient Re-evaluated prior to inductionOxygen Delivery Method: Circle system utilized Preoxygenation: Pre-oxygenation with 100% oxygen Intubation Type: IV induction, Rapid sequence and Cricoid Pressure applied Laryngoscope Size: Mac and 3 Grade View: Grade I Tube type: Oral Tube size: 7.0 mm Number of attempts: 1 Airway Equipment and Method: Stylet Placement Confirmation: ETT inserted through vocal cords under direct vision,  positive ETCO2 and breath sounds checked- equal and bilateral Secured at: 21 cm Tube secured with: Tape Dental Injury: Teeth and Oropharynx as per pre-operative assessment

## 2013-03-26 NOTE — Progress Notes (Signed)
Patient developed N/V and then increased pain in RLE; SBP decreased to 65. Patient given normal saline bolus and now started on low dose dopamine. Hematoma appeared to be enlarging. Pressure held. Vascular surgery consulted and plans emergent exploration. Repeat CBC post op and will likely need transfusion. 60 min CCT 8:30-9:30 AM Kirk Ruths

## 2013-03-26 NOTE — Progress Notes (Signed)
Pt back from OR. Drowsy, but easily arousable and oriented. VS as noted in CHL. New L radial A-line in place. Poor waveform noted on monitor. R groin incision dsg CDI. JP draining sanguinous fluid. Foley from OR draining clear yellow urine. Daughter at bedside.

## 2013-03-27 ENCOUNTER — Encounter (HOSPITAL_COMMUNITY): Payer: Self-pay | Admitting: *Deleted

## 2013-03-27 DIAGNOSIS — I200 Unstable angina: Secondary | ICD-10-CM

## 2013-03-27 DIAGNOSIS — I359 Nonrheumatic aortic valve disorder, unspecified: Secondary | ICD-10-CM

## 2013-03-27 LAB — GLUCOSE, CAPILLARY
GLUCOSE-CAPILLARY: 172 mg/dL — AB (ref 70–99)
GLUCOSE-CAPILLARY: 195 mg/dL — AB (ref 70–99)
Glucose-Capillary: 126 mg/dL — ABNORMAL HIGH (ref 70–99)
Glucose-Capillary: 164 mg/dL — ABNORMAL HIGH (ref 70–99)

## 2013-03-27 LAB — BASIC METABOLIC PANEL
BUN: 21 mg/dL (ref 6–23)
CO2: 21 mEq/L (ref 19–32)
Calcium: 8.2 mg/dL — ABNORMAL LOW (ref 8.4–10.5)
Chloride: 106 mEq/L (ref 96–112)
Creatinine, Ser: 1.54 mg/dL — ABNORMAL HIGH (ref 0.50–1.10)
GFR calc Af Amer: 36 mL/min — ABNORMAL LOW (ref >90)
GFR calc non Af Amer: 31 mL/min — ABNORMAL LOW (ref >90)
Glucose, Bld: 109 mg/dL — ABNORMAL HIGH (ref 70–99)
Potassium: 4 mEq/L (ref 3.7–5.3)
Sodium: 141 mEq/L (ref 137–147)

## 2013-03-27 LAB — CBC
HCT: 37.7 % (ref 36.0–46.0)
Hemoglobin: 13.2 g/dL (ref 12.0–15.0)
MCH: 29.7 pg (ref 26.0–34.0)
MCHC: 35 g/dL (ref 30.0–36.0)
MCV: 84.9 fL (ref 78.0–100.0)
Platelets: 79 10*3/uL — ABNORMAL LOW (ref 150–400)
RBC: 4.44 MIL/uL (ref 3.87–5.11)
RDW: 15.9 % — ABNORMAL HIGH (ref 11.5–15.5)
WBC: 10.9 10*3/uL — ABNORMAL HIGH (ref 4.0–10.5)

## 2013-03-27 MED ORDER — AMLODIPINE BESYLATE 5 MG PO TABS
5.0000 mg | ORAL_TABLET | Freq: Every day | ORAL | Status: DC
  Administered 2013-03-27 – 2013-03-29 (×3): 5 mg via ORAL
  Filled 2013-03-27 (×3): qty 1

## 2013-03-27 MED ORDER — HYDRALAZINE HCL 20 MG/ML IJ SOLN
10.0000 mg | Freq: Four times a day (QID) | INTRAMUSCULAR | Status: DC | PRN
  Filled 2013-03-27: qty 1

## 2013-03-27 NOTE — Progress Notes (Signed)
CARDIAC REHAB PHASE I   PRE:  Rate/Rhythm: 67 SR  BP:  Supine: 150/57  Sitting:   Standing:    SaO2: 97%RA  MODE:  Ambulation:  Pivoted to chair  ft   POST:  Rate/Rhythm: 65 SR  BP:  Supine:   Sitting: 153/39  Standing:    SaO2: 99%RA 1405-1445 Talked with RN prior to getting pt to chair. Assisted pt to sitting on side of bed few minutes before she pivoted on left leg to recliner. Moved right leg gently in bed as pt stated could not move on her own.. No dizziness with sitting or pivoting. To recliner with call bell. Family in room. Pt did not bear weight on right leg. Encouraged her to sit in recliner to increase strength.   Graylon Good, RN BSN  03/27/2013 2:40 PM

## 2013-03-27 NOTE — Progress Notes (Signed)
Subjective: Interval History: none.. comfortable. Reports marked improvement in severe pain she had with expanding hematoma yesterday  Objective: Vital signs in last 24 hours: Temp:  [97.6 F (36.4 C)-98.8 F (37.1 C)] 98.8 F (37.1 C) (03/07 0700) Pulse Rate:  [25-95] 71 (03/07 0700) Resp:  [0-25] 10 (03/07 0700) BP: (64-181)/(32-97) 175/50 mmHg (03/07 0700) SpO2:  [92 %-100 %] 98 % (03/07 0700) Arterial Line BP: (107-218)/(51-82) 107/82 mmHg (03/06 1254)  Intake/Output from previous day: 03/06 0701 - 03/07 0700 In: 2169 [P.O.:120; I.V.:1129; Blood:670; IV Piggyback:250] Out: 5573 [Urine:850; Drains:140; Blood:700] Intake/Output this shift:    Right thigh was some bruising. Soft. J-P reportedly at 140 cc out  Lab Results:  Recent Labs  03/26/13 1705 03/27/13 0300  WBC 12.8* 10.9*  HGB 14.1 13.2  HCT 41.0 37.7  PLT 72* 79*   BMET  Recent Labs  03/26/13 0715 03/26/13 1028 03/26/13 1104 03/27/13 0300  NA 140 141 142 141  K 3.8 3.7 3.6* 4.0  CL 101  --   --  106  CO2 22  --   --  21  GLUCOSE 226* 284*  --  109*  BUN 25*  --   --  21  CREATININE 1.66*  --   --  1.54*  CALCIUM 8.7  --   --  8.2*    Studies/Results: Ct Abdomen Pelvis Wo Contrast  03/26/2013   CLINICAL DATA:  Post catheterization with hematoma.  Pain.  EXAM: CT ABDOMEN AND PELVIS WITHOUT CONTRAST  TECHNIQUE: Multidetector CT imaging of the abdomen and pelvis was performed following the standard protocol without intravenous contrast.  COMPARISON:  No comparison CT abdomen pelvis available for direct review. There is a report from 09/15/2006 exam.  FINDINGS: There is a large right anterior thigh hematoma which measures 11 x 6 x 17 cm. This is superficial to the right calf musculature without deep thigh a hematoma noted. No evidence of retroperitoneal hematoma. At the level of the right femoral veins from which the hematoma arose, no definitive findings of aneurysm.  Lung bases clear.  Prominent coronary  artery calcifications/ stents. Heart size within normal limits. Mitral and aortic valve calcifications.  Ascending thoracic aorta ectatic and measures up to 3.8 cm.  Calcification of the abdominal aorta with ectasia without focal aneurysm. Atherosclerotic type changes common iliac arteries with narrowing greater on the right. Calcification aortic branch vessels. Ectatic splenic artery.  Residual contrast within kidneys from recent catheterization. Heterogeneous appearance of the kidneys without worrisome mass noted.  Contrast within the urinary bladder without obvious mass.  No worrisome splenic, hepatic or adrenal lesion.  Projecting anteriorly from the pancreatic neck region is a 3 x 2 x 2 cm cystic lesion. This may represent an intra papillary mucinous tumor although incompletely assessed. Follow-up CT in 6 months recommended for further delineation  Scattered colonic diverticula without extra luminal bowel inflammatory process or free air.  Post cholecystectomy, appendectomy and hysterectomy.  Degenerative changes lumbar spine most notable L3-4 and L4-5 level.  IMPRESSION: There is a large right anterior thigh hematoma which measures 11 x 6 x 17 cm. This is superficial to the right calf musculature without deep thigh hematoma noted. No evidence of retroperitoneal hematoma. At the level of the right femoral veins from which the hematoma arose, no definitive findings of aneurysm.  Prominent coronary artery calcifications/ stents.  Ascending thoracic aorta ectatic and measures up to 3.8 cm.  Calcification of the abdominal aorta with ectasia without focal aneurysm. Atherosclerotic type changes  common iliac arteries with narrowing greater on the right. Calcification aortic branch vessels. Ectatic splenic artery.  Projecting anteriorly from the pancreatic neck region is a 3 x 2 x 2 cm cystic lesion. This may represent an intra papillary mucinous tumor although incompletely assessed. Follow-up CT in 6 months  recommended for further delineation  These results were called by telephone at the time of interpretation on 03/26/2013 at 5:30 AM to Dr. Jules Husbands , who verbally acknowledged these results.   Electronically Signed   By: Chauncey Cruel M.D.   On: 03/26/2013 05:32   Anti-infectives: Anti-infectives   None      Assessment/Plan: s/p Procedure(s): FEMORAL ARTERY EXPLORATION WITH SUTURE REPAIR; EVACUATION OF HEMATOMA (Right) Hemoglobin stable. Palpable popliteal pulse below right groin injury. Plan DC JP drain Jonelle Sidle if minimal output. Okay to mobilize as needed   LOS: 2 days   Darrel Gloss 03/27/2013, 8:48 AM

## 2013-03-27 NOTE — Progress Notes (Addendum)
Patient Name: Sharon Hubbard Date of Encounter: 03/27/2013  Active Problems:   Unstable angina   Unstable angina pectoris   Length of Stay: 2  SUBJECTIVE  Feels much better today. No angina or dyspnea Hands are puffy after all the IV fluids Groin is mildly uncomfortable 80 mL drainage from groin hematoma Hgb only dropped 1 g.  CURRENT MEDS . amLODipine  5 mg Oral Daily  . aspirin  81 mg Oral Daily  . clopidogrel  75 mg Oral Q breakfast  . divalproex  500 mg Oral Daily  . insulin aspart  0-15 Units Subcutaneous TID WC  . insulin aspart  4 Units Subcutaneous TID WC  . latanoprost  1 drop Both Eyes QHS  . ondansetron (ZOFRAN) IV  4 mg Intravenous 4 times per day  . pantoprazole  40 mg Oral Daily  . predniSONE  4 mg Oral BH-q7a  . rosuvastatin  20 mg Oral q1800  . sodium chloride  3 mL Intravenous Q12H  . timolol  1 drop Both Eyes Daily    OBJECTIVE   Intake/Output Summary (Last 24 hours) at 03/27/13 1104 Last data filed at 03/27/13 0900  Gross per 24 hour  Intake   1668 ml  Output   1690 ml  Net    -22 ml   Filed Weights   03/25/13 1020 03/26/13 0505  Weight: 65.318 kg (144 lb) 64.5 kg (142 lb 3.2 oz)    PHYSICAL EXAM Filed Vitals:   03/27/13 0600 03/27/13 0700 03/27/13 0800 03/27/13 0900  BP: 143/49 175/50 192/56 123/38  Pulse: 64 71 70 71  Temp:  98.8 F (37.1 C)    TempSrc:  Oral    Resp: 6 10 11 22   Height:      Weight:      SpO2: 95% 98% 96% 95%   General: Alert, oriented x3, no distress Head: no evidence of trauma, PERRL, EOMI, no exophtalmos or lid lag, no myxedema, no xanthelasma; normal ears, nose and oropharynx Neck: normal jugular venous pulsations and no hepatojugular reflux; brisk carotid pulses without delay and no carotid bruits Chest: clear to auscultation, no signs of consolidation by percussion or palpation, normal fremitus, symmetrical and full respiratory excursions Cardiovascular: normal position and quality of the apical  impulse, regular rhythm, normal first and second heart sounds, no rubs or gallops, 2/6 aortic ejection murmur Abdomen: no tenderness or distention, no masses by palpation, no abnormal pulsatility or arterial bruits, normal bowel sounds, no hepatosplenomegaly Extremities: large right infrainguinal hematoma, drain in place,no clubbing, cyanosis or edema; 2+ radial, ulnar and brachial pulses bilaterally; 2+ right femoral, posterior tibial and dorsalis pedis pulses; 2+ left femoral, posterior tibial and dorsalis pedis pulses; no subclavian or femoral bruits Neurological: grossly nonfocal  LABS  CBC  Recent Labs  03/26/13 1705 03/27/13 0300  WBC 12.8* 10.9*  HGB 14.1 13.2  HCT 41.0 37.7  MCV 87.2 84.9  PLT 72* 79*   Basic Metabolic Panel  Recent Labs  03/26/13 0715 03/26/13 1028 03/26/13 1104 03/27/13 0300  NA 140 141 142 141  K 3.8 3.7 3.6* 4.0  CL 101  --   --  106  CO2 22  --   --  21  GLUCOSE 226* 284*  --  109*  BUN 25*  --   --  21  CREATININE 1.66*  --   --  1.54*  CALCIUM 8.7  --   --  8.2*   Radiology Studies Imaging results have been reviewed and  Ct Abdomen Pelvis Wo Contrast  03/26/2013   CLINICAL DATA:  Post catheterization with hematoma.  Pain.  EXAM: CT ABDOMEN AND PELVIS WITHOUT CONTRAST  TECHNIQUE: Multidetector CT imaging of the abdomen and pelvis was performed following the standard protocol without intravenous contrast.  COMPARISON:  No comparison CT abdomen pelvis available for direct review. There is a report from 09/15/2006 exam.  FINDINGS: There is a large right anterior thigh hematoma which measures 11 x 6 x 17 cm. This is superficial to the right calf musculature without deep thigh a hematoma noted. No evidence of retroperitoneal hematoma. At the level of the right femoral veins from which the hematoma arose, no definitive findings of aneurysm.  Lung bases clear.  Prominent coronary artery calcifications/ stents. Heart size within normal limits. Mitral and  aortic valve calcifications.  Ascending thoracic aorta ectatic and measures up to 3.8 cm.  Calcification of the abdominal aorta with ectasia without focal aneurysm. Atherosclerotic type changes common iliac arteries with narrowing greater on the right. Calcification aortic branch vessels. Ectatic splenic artery.  Residual contrast within kidneys from recent catheterization. Heterogeneous appearance of the kidneys without worrisome mass noted.  Contrast within the urinary bladder without obvious mass.  No worrisome splenic, hepatic or adrenal lesion.  Projecting anteriorly from the pancreatic neck region is a 3 x 2 x 2 cm cystic lesion. This may represent an intra papillary mucinous tumor although incompletely assessed. Follow-up CT in 6 months recommended for further delineation  Scattered colonic diverticula without extra luminal bowel inflammatory process or free air.  Post cholecystectomy, appendectomy and hysterectomy.  Degenerative changes lumbar spine most notable L3-4 and L4-5 level.  IMPRESSION: There is a large right anterior thigh hematoma which measures 11 x 6 x 17 cm. This is superficial to the right calf musculature without deep thigh hematoma noted. No evidence of retroperitoneal hematoma. At the level of the right femoral veins from which the hematoma arose, no definitive findings of aneurysm.  Prominent coronary artery calcifications/ stents.  Ascending thoracic aorta ectatic and measures up to 3.8 cm.  Calcification of the abdominal aorta with ectasia without focal aneurysm. Atherosclerotic type changes common iliac arteries with narrowing greater on the right. Calcification aortic branch vessels. Ectatic splenic artery.  Projecting anteriorly from the pancreatic neck region is a 3 x 2 x 2 cm cystic lesion. This may represent an intra papillary mucinous tumor although incompletely assessed. Follow-up CT in 6 months recommended for further delineation  These results were called by telephone at the  time of interpretation on 03/26/2013 at 5:30 AM to Dr. Jules Husbands , who verbally acknowledged these results.   Electronically Signed   By: Chauncey Cruel M.D.   On: 03/26/2013 05:32    TELE NSR  ECG 3/6 - junctional rhythm ST depression anterolaterally  ASSESSMENT AND PLAN Large right groin hematoma appears stable. Start to resume antihypertensives. No symptoms of coronary insufficiency. Defer drain management to Dr. Donnetta Hutching and will ask his opinion re: when it is safe to ambulate.   Sanda Klein, MD, [facility] [phone] office [phone] pager 03/27/2013 11:04 AM

## 2013-03-28 LAB — GLUCOSE, CAPILLARY
GLUCOSE-CAPILLARY: 279 mg/dL — AB (ref 70–99)
GLUCOSE-CAPILLARY: 193 mg/dL — AB (ref 70–99)
Glucose-Capillary: 115 mg/dL — ABNORMAL HIGH (ref 70–99)

## 2013-03-28 LAB — CBC
HCT: 30.3 % — ABNORMAL LOW (ref 36.0–46.0)
HEMOGLOBIN: 10.4 g/dL — AB (ref 12.0–15.0)
MCH: 29.8 pg (ref 26.0–34.0)
MCHC: 34.3 g/dL (ref 30.0–36.0)
MCV: 86.8 fL (ref 78.0–100.0)
Platelets: 67 10*3/uL — ABNORMAL LOW (ref 150–400)
RBC: 3.49 MIL/uL — ABNORMAL LOW (ref 3.87–5.11)
RDW: 15.2 % (ref 11.5–15.5)
WBC: 8 10*3/uL (ref 4.0–10.5)

## 2013-03-28 NOTE — Evaluation (Signed)
Physical Therapy Evaluation Patient Details Name: Sharon Hubbard MRN: [id number] DOB: October 25, 1933 Today's Date: 03/28/2013 Time: 2706-2376 PT Time Calculation (min): 22 min  PT Assessment / Plan / Recommendation History of Present Illness  Pt with rt groin hematoma after heart cath underwent FEMORAL ARTERY EXPLORATION WITH SUTURE REPAIR; EVACUATION OF HEMATOMA on 03/26/13.  Clinical Impression  Pt admitted with above. Pt currently with functional limitations due to the deficits listed below (see PT Problem List).  Pt will benefit from skilled PT to increase their independence and safety with mobility to allow discharge to home. Feel pt will progress quickly.      PT Assessment  Patient needs continued PT services    Follow Up Recommendations  Home health PT    Does the patient have the potential to tolerate intense rehabilitation      Barriers to Discharge        Equipment Recommendations  None recommended by PT    Recommendations for Other Services     Frequency Min 4X/week    Precautions / Restrictions     Pertinent Vitals/Pain Pain in rt groin thigh. Pain reduced with use of walker.      Mobility  Transfers Overall transfer level: Needs assistance Equipment used: Rolling walker (2 wheeled) Transfers: Sit to/from Stand Sit to Stand: Min assist General transfer comment: Assist to bring hips up. Ambulation/Gait Ambulation/Gait assistance: Min guard Ambulation Distance (Feet): 70 Feet Assistive device: Rolling walker (2 wheeled) Gait Pattern/deviations: Step-through pattern;Decreased step length - right;Decreased stance time - right General Gait Details: Initially pt having to partially slide rt foot forward but with ince distance able to pick foot up and advance.  Incr step length as distance increased.    Exercises     PT Diagnosis: Difficulty walking;Acute pain  PT Problem List: Decreased strength;Decreased activity tolerance;Decreased mobility;Decreased  knowledge of use of DME;Pain PT Treatment Interventions: DME instruction;Gait training;Functional mobility training;Therapeutic activities;Therapeutic exercise;Patient/family education     PT Goals(Current goals can be found in the care plan section) Acute Rehab PT Goals Patient Stated Goal: Return home PT Goal Formulation: With patient Time For Goal Achievement: 04/04/13 Potential to Achieve Goals: Good  Visit Information  Last PT Received On: 03/28/13 Assistance Needed: +1 History of Present Illness: Pt with rt groin hematoma after heart cath underwent FEMORAL ARTERY EXPLORATION WITH SUTURE REPAIR; EVACUATION OF HEMATOMA on 03/26/13.       Prior [place] expects to be discharged to:: Private residence Living Arrangements: Spouse/significant other Available Help at Discharge: Family;Available 24 hours/day Type of Home: House Home Access: Ramped entrance Home Layout: One level Home Equipment: Walker - 2 wheels Additional Comments: Pt reports husband has had previous CVA but he doesn't require physical assistance. Prior Function Level of Independence: Independent Communication Communication: HOH    Cognition  Cognition Arousal/Alertness: Awake/alert Behavior During Therapy: WFL for tasks assessed/performed Overall Cognitive Status: Within Functional Limits for tasks assessed    Extremity/Trunk Assessment Upper Extremity Assessment Upper Extremity Assessment: Overall WFL for tasks assessed Lower Extremity Assessment Lower Extremity Assessment: RLE deficits/detail RLE Deficits / Details: Decr strength due to pain.   Balance Balance Overall balance assessment: Needs assistance Standing balance support: Bilateral upper extremity supported Standing balance comment: Stood with rolling walker with supervision.  End of Session PT - End of Session Equipment Utilized During Treatment: Gait belt Activity Tolerance: Patient tolerated treatment  well Patient left: in chair;with call bell/phone within reach;with family/visitor present Nurse Communication: Mobility status  GP  Aislynn Cifelli 03/28/2013, 11:54 AM  Suanne Marker PT [phone]

## 2013-03-28 NOTE — Progress Notes (Signed)
Subjective: Interval History: none.. ports soreness. Has been up to the chair.   Objective: Vital signs in last 24 hours: Temp:  [98.3 F (36.8 C)-98.8 F (37.1 C)] 98.7 F (37.1 C) (03/08 0727) Pulse Rate:  [52-69] 52 (03/08 0700) Resp:  [8-27] 12 (03/08 0700) BP: (108-168)/(25-59) 123/39 mmHg (03/08 0700) SpO2:  [92 %-99 %] 93 % (03/08 0700)  Intake/Output from previous day: 03/07 0701 - 03/08 0700 In: 1364.3 [P.O.:1260; I.V.:104.3] Out: 660 [Urine:600; Drains:60] Intake/Output this shift:    Right groin healing. Dressing removed and a and C. incision looks good.  Lab Results:  Recent Labs  03/26/13 1705 03/27/13 0300  WBC 12.8* 10.9*  HGB 14.1 13.2  HCT 41.0 37.7  PLT 72* 79*   BMET  Recent Labs  03/26/13 0715 03/26/13 1028 03/26/13 1104 03/27/13 0300  NA 140 141 142 141  K 3.8 3.7 3.6* 4.0  CL 101  --   --  106  CO2 22  --   --  21  GLUCOSE 226* 284*  --  109*  BUN 25*  --   --  21  CREATININE 1.66*  --   --  1.54*  CALCIUM 8.7  --   --  8.2*    Studies/Results: Ct Abdomen Pelvis Wo Contrast  03/26/2013   CLINICAL DATA:  Post catheterization with hematoma.  Pain.  EXAM: CT ABDOMEN AND PELVIS WITHOUT CONTRAST  TECHNIQUE: Multidetector CT imaging of the abdomen and pelvis was performed following the standard protocol without intravenous contrast.  COMPARISON:  No comparison CT abdomen pelvis available for direct review. There is a report from 09/15/2006 exam.  FINDINGS: There is a large right anterior thigh hematoma which measures 11 x 6 x 17 cm. This is superficial to the right calf musculature without deep thigh a hematoma noted. No evidence of retroperitoneal hematoma. At the level of the right femoral veins from which the hematoma arose, no definitive findings of aneurysm.  Lung bases clear.  Prominent coronary artery calcifications/ stents. Heart size within normal limits. Mitral and aortic valve calcifications.  Ascending thoracic aorta ectatic and  measures up to 3.8 cm.  Calcification of the abdominal aorta with ectasia without focal aneurysm. Atherosclerotic type changes common iliac arteries with narrowing greater on the right. Calcification aortic branch vessels. Ectatic splenic artery.  Residual contrast within kidneys from recent catheterization. Heterogeneous appearance of the kidneys without worrisome mass noted.  Contrast within the urinary bladder without obvious mass.  No worrisome splenic, hepatic or adrenal lesion.  Projecting anteriorly from the pancreatic neck region is a 3 x 2 x 2 cm cystic lesion. This may represent an intra papillary mucinous tumor although incompletely assessed. Follow-up CT in 6 months recommended for further delineation  Scattered colonic diverticula without extra luminal bowel inflammatory process or free air.  Post cholecystectomy, appendectomy and hysterectomy.  Degenerative changes lumbar spine most notable L3-4 and L4-5 level.  IMPRESSION: There is a large right anterior thigh hematoma which measures 11 x 6 x 17 cm. This is superficial to the right calf musculature without deep thigh hematoma noted. No evidence of retroperitoneal hematoma. At the level of the right femoral veins from which the hematoma arose, no definitive findings of aneurysm.  Prominent coronary artery calcifications/ stents.  Ascending thoracic aorta ectatic and measures up to 3.8 cm.  Calcification of the abdominal aorta with ectasia without focal aneurysm. Atherosclerotic type changes common iliac arteries with narrowing greater on the right. Calcification aortic branch vessels. Ectatic splenic artery.  Projecting anteriorly from the pancreatic neck region is a 3 x 2 x 2 cm cystic lesion. This may represent an intra papillary mucinous tumor although incompletely assessed. Follow-up CT in 6 months recommended for further delineation  These results were called by telephone at the time of interpretation on 03/26/2013 at 5:30 AM to Dr. Jules Husbands ,  who verbally acknowledged these results.   Electronically Signed   By: Chauncey Cruel M.D.   On: 03/26/2013 05:32   Anti-infectives: Anti-infectives   None      Assessment/Plan: s/p Procedure(s): FEMORAL ARTERY EXPLORATION WITH SUTURE REPAIR; EVACUATION OF HEMATOMA (Right) Stable status post repair femoral artery 48 hours ago. Will DC Jackson-Pratt drain today. Mobilize.   LOS: 3 days   Sharon Hubbard 03/28/2013, 9:53 AM

## 2013-03-28 NOTE — Progress Notes (Signed)
JP bulb d/c'd .the patient tol well .23ml opaque dark red drainage w/in the bulb at time of removal . Pea-sized opening (where J/P tubing was) could not  be closed w/steri-strips because the skin to the Rt thigh is very taut .Incision superior of  J/p site is well approx w/all staples intact . These sites gently swabbed w/chlorhexidine ,then covered w/dry gauze and medipore tape .Purplish bruising remains w/in lines ( of marker pen) .

## 2013-03-28 NOTE — Progress Notes (Signed)
Patient Name: Sharon Hubbard Date of Encounter: 03/28/2013  Active Problems:   Unstable angina   Unstable angina pectoris   Length of Stay: 3  SUBJECTIVE   No angina or dyspnea Thigh very painful when she moves  CURRENT MEDS . amLODipine  5 mg Oral Daily  . aspirin  81 mg Oral Daily  . clopidogrel  75 mg Oral Q breakfast  . divalproex  500 mg Oral Daily  . insulin aspart  0-15 Units Subcutaneous TID WC  . insulin aspart  4 Units Subcutaneous TID WC  . latanoprost  1 drop Both Eyes QHS  . ondansetron (ZOFRAN) IV  4 mg Intravenous 4 times per day  . pantoprazole  40 mg Oral Daily  . predniSONE  4 mg Oral BH-q7a  . rosuvastatin  20 mg Oral q1800  . sodium chloride  3 mL Intravenous Q12H  . timolol  1 drop Both Eyes Daily    OBJECTIVE   Intake/Output Summary (Last 24 hours) at 03/28/13 0843 Last data filed at 03/28/13 0600  Gross per 24 hour  Intake 1354.33 ml  Output    660 ml  Net 694.33 ml   Filed Weights   03/25/13 1020 03/26/13 0505  Weight: 65.318 kg (144 lb) 64.5 kg (142 lb 3.2 oz)    PHYSICAL EXAM Filed Vitals:   03/28/13 0500 03/28/13 0600 03/28/13 0700 03/28/13 0727  BP: 168/40 133/43 123/39   Pulse: 52 55 52   Temp:    98.7 F (37.1 C)  TempSrc:    Oral  Resp: 10 10 12    Height:      Weight:      SpO2: 96% 96% 93%    General: Alert, oriented x3, no distress Head: no evidence of trauma, PERRL, EOMI, no exophtalmos or lid lag, no myxedema, no xanthelasma; normal ears, nose and oropharynx Neck: normal jugular venous pulsations and no hepatojugular reflux; brisk carotid pulses without delay and no carotid bruits Chest: clear to auscultation, no signs of consolidation by percussion or palpation, normal fremitus, symmetrical and full respiratory excursions Cardiovascular: normal position and quality of the apical impulse, regular rhythm, normal first and second heart sounds, no rubs or gallops, no murmur Abdomen: no tenderness or distention, no  masses by palpation, no abnormal pulsatility or arterial bruits, normal bowel sounds, no hepatosplenomegaly Extremities: large right infrainguinal hematoma, drain in place no clubbing, cyanosis or edema; 2+ radial, ulnar and brachial pulses bilaterally; 2+ right femoral, posterior tibial and dorsalis pedis pulses; 2+ left femoral, posterior tibial and dorsalis pedis pulses; no subclavian or femoral bruits Neurological: grossly nonfocal  LABS  CBC  Recent Labs  03/26/13 1705 03/27/13 0300  WBC 12.8* 10.9*  HGB 14.1 13.2  HCT 41.0 37.7  MCV 87.2 84.9  PLT 72* 79*   Basic Metabolic Panel  Recent Labs  03/26/13 0715 03/26/13 1028 03/26/13 1104 03/27/13 0300  NA 140 141 142 141  K 3.8 3.7 3.6* 4.0  CL 101  --   --  106  CO2 22  --   --  21  GLUCOSE 226* 284*  --  109*  BUN 25*  --   --  21  CREATININE 1.66*  --   --  1.54*  CALCIUM 8.7  --   --  8.2*    Radiology Studies Imaging results have been reviewed and No results found.  TELE NSR   ASSESSMENT AND PLAN  Unstable angina s/p PTCA/DES proximal LAD Large right groin hematoma  Appears  stable from a cardiac standpoint. Hematoma softer. Able to sit up yesterday, but movement is still very painful. Will reevaluate response to PT before making a recommendation for inpatient versus outpatient rehab. Recheck platelet count  Sanda Klein, MD, [facility] [phone] office [phone] pager 03/28/2013 8:43 AM

## 2013-03-29 DIAGNOSIS — R031 Nonspecific low blood-pressure reading: Secondary | ICD-10-CM

## 2013-03-29 DIAGNOSIS — I2510 Atherosclerotic heart disease of native coronary artery without angina pectoris: Secondary | ICD-10-CM

## 2013-03-29 DIAGNOSIS — I498 Other specified cardiac arrhythmias: Secondary | ICD-10-CM

## 2013-03-29 DIAGNOSIS — E785 Hyperlipidemia, unspecified: Secondary | ICD-10-CM

## 2013-03-29 DIAGNOSIS — D696 Thrombocytopenia, unspecified: Secondary | ICD-10-CM | POA: Diagnosis present

## 2013-03-29 DIAGNOSIS — I10 Essential (primary) hypertension: Secondary | ICD-10-CM

## 2013-03-29 LAB — GLUCOSE, CAPILLARY
GLUCOSE-CAPILLARY: 203 mg/dL — AB (ref 70–99)
GLUCOSE-CAPILLARY: 128 mg/dL — AB (ref 70–99)
GLUCOSE-CAPILLARY: 188 mg/dL — AB (ref 70–99)
Glucose-Capillary: 184 mg/dL — ABNORMAL HIGH (ref 70–99)
Glucose-Capillary: 222 mg/dL — ABNORMAL HIGH (ref 70–99)

## 2013-03-29 LAB — CBC
HCT: 28.5 % — ABNORMAL LOW (ref 36.0–46.0)
HEMOGLOBIN: 9.8 g/dL — AB (ref 12.0–15.0)
MCH: 29.9 pg (ref 26.0–34.0)
MCHC: 34.4 g/dL (ref 30.0–36.0)
MCV: 86.9 fL (ref 78.0–100.0)
Platelets: 82 10*3/uL — ABNORMAL LOW (ref 150–400)
RBC: 3.28 MIL/uL — AB (ref 3.87–5.11)
RDW: 14.7 % (ref 11.5–15.5)
WBC: 7.4 10*3/uL (ref 4.0–10.5)

## 2013-03-29 LAB — BASIC METABOLIC PANEL
BUN: 14 mg/dL (ref 6–23)
CO2: 25 mEq/L (ref 19–32)
Calcium: 8.5 mg/dL (ref 8.4–10.5)
Chloride: 103 mEq/L (ref 96–112)
Creatinine, Ser: 1.28 mg/dL — ABNORMAL HIGH (ref 0.50–1.10)
GFR calc non Af Amer: 39 mL/min — ABNORMAL LOW (ref >90)
GFR, EST AFRICAN AMERICAN: 45 mL/min — AB (ref >90)
Glucose, Bld: 212 mg/dL — ABNORMAL HIGH (ref 70–99)
POTASSIUM: 3.8 meq/L (ref 3.7–5.3)
Sodium: 139 mEq/L (ref 137–147)

## 2013-03-29 LAB — SAVE SMEAR

## 2013-03-29 MED ORDER — AMLODIPINE BESYLATE 10 MG PO TABS
10.0000 mg | ORAL_TABLET | Freq: Every day | ORAL | Status: DC
  Administered 2013-03-30 – 2013-04-01 (×3): 10 mg via ORAL
  Filled 2013-03-29 (×3): qty 1

## 2013-03-29 MED ORDER — AMLODIPINE BESYLATE 5 MG PO TABS
5.0000 mg | ORAL_TABLET | Freq: Once | ORAL | Status: AC
  Administered 2013-03-29: 5 mg via ORAL
  Filled 2013-03-29: qty 1

## 2013-03-29 MED ORDER — POLYETHYLENE GLYCOL 3350 17 G PO PACK
17.0000 g | PACK | Freq: Every day | ORAL | Status: DC | PRN
  Filled 2013-03-29: qty 1

## 2013-03-29 NOTE — Progress Notes (Signed)
Physical Therapy Treatment Patient Details Name: Sharon Hubbard MRN: [id number] DOB: [birth year] Today's Date: 03/29/2013 Time: 9379-0240 PT Time Calculation (min): 28 min  PT Assessment / Plan / Recommendation  History of Present Illness Pt with rt groin hematoma after heart cath underwent FEMORAL ARTERY EXPLORATION WITH SUTURE REPAIR; EVACUATION OF HEMATOMA on 03/26/13.   PT Comments   Pt progressing with mobility able to get OOB and ambulate without physical assist today. Pt encouraged to be ambulating to bathroom and continue HEP. Will continue to follow.    Follow Up Recommendations  Home health PT     Does the patient have the potential to tolerate intense rehabilitation     Barriers to Discharge        Equipment Recommendations       Recommendations for Other Services    Frequency Min 3X/week   Progress towards PT Goals Progress towards PT goals: Progressing toward goals  Plan Frequency needs to be updated;Current plan remains appropriate    Precautions / Restrictions Precautions Precautions: Fall Restrictions Weight Bearing Restrictions: No   Pertinent Vitals/Pain BP 177/52 RN and MD aware and ok with mobility sats 90-99% on RA HR 74 No pain at rest. Periods of sharp pain 7/10 with positioning in bed R groin     Mobility  Bed Mobility Overal bed mobility: Modified Independent General bed mobility comments: increased time with use of rail Transfers Overall transfer level: Needs assistance Equipment used: Rolling walker (2 wheeled) Transfers: Sit to/from Stand Sit to Stand: Supervision General transfer comment: cues for hand placement Ambulation/Gait Ambulation/Gait assistance: Supervision Ambulation Distance (Feet): 90 Feet Assistive device: Rolling walker (2 wheeled) Gait Pattern/deviations: Step-through pattern;Decreased stride length Gait velocity interpretation: Below normal speed for age/gender General Gait Details: cues for sequence and position  in RW    Exercises General Exercises - Lower Extremity Long Arc Quad: AROM;Both;10 reps;Seated Hip Flexion/Marching: AROM;Both;10 reps;Seated   PT Diagnosis:    PT Problem List:   PT Treatment Interventions:     PT Goals (current goals can now be found in the care plan section)    Visit Information  Last PT Received On: 03/29/13 Assistance Needed: +1 History of Present Illness: Pt with rt groin hematoma after heart cath underwent FEMORAL ARTERY EXPLORATION WITH SUTURE REPAIR; EVACUATION OF HEMATOMA on 03/26/13.    Subjective Data      Cognition  Cognition Arousal/Alertness: Awake/alert Behavior During Therapy: WFL for tasks assessed/performed Overall Cognitive Status: Within Functional Limits for tasks assessed    Balance     End of Session PT - End of Session Equipment Utilized During Treatment: Gait belt Activity Tolerance: Patient tolerated treatment well Patient left: in chair;with call bell/phone within reach;with family/visitor present Nurse Communication: Mobility status   GP     Melford Aase 03/29/2013, 9:46 AM Elwyn Reach, [place]

## 2013-03-29 NOTE — Consult Note (Signed)
Referral MD  Reason for Referral: thrombocytopenia  No chief complaint on file. : low platelets  HPI: 80yo white female with low platelets.  SReview of EMR shows normal plts in 12/2012. Since then, platelets gradually dropping.  Had recent cardiac cath with stent and then had major bleed in right thigh.  Had 4units of blood. No bleeding elsewhere. Bleeding stopped. No obvious change in meds.  No risk factors for Hepatitis B/C or HIV.  Does have PMR.  H/o past TIA.    Past Medical History  Diagnosis Date  . CAD (coronary artery disease)     Interventions in the past  . Groin hematoma     April, 2011  . Hypertension   . Diabetes mellitus   . Dyslipidemia   . GERD (gastroesophageal reflux disease)   . Schatzki's ring   . Renal insufficiency   . Polymyalgia rheumatica   . Osteoarthritis     arthroscopic surgery right knee February, 2012  . Spinal stenosis     history of surgery for this  . Ejection fraction     EF 65%, echo, April, 2012, aortic valve sclerosis with very slight gradient  . Preoperative evaluation to rule out surgical contraindication     Patient needs back surgery by Dr. Carloyn Manner, May, 2012                . Pancreas cyst     The patient has multiple pancreatic cysts.  This is followed at [facility]         . Complication of anesthesia     "felt like I was smothering once with mask on my face"  . RBBB (right bundle branch block)     old  . Bradycardia, sinus   . Myocardial infarction 2011; 08/2010  . Shortness of breath on exertion   . Blood transfusion   . Anemia   . Stroke 01/18/11    "I've had 2 TIA's"  . Cancer     ""had hysterectomy for a 6 showing cancer; think that's pretty strong  . Aortic stenosis     very mild, echo 12/2010  . Femoral bruit     01/2011 hosp, but no pseudoaneurysm or AV fistula  :  Past Surgical History  Procedure Laterality Date  . Cholecystectomy  1987  . Appendectomy    . Back surgery    . Lumbar spine surgery  ~ 1977; ~  2010    "2 hugh ruptured discs; I was paralyzed first OR; 2nd OR by Dr. Carloyn Manner, don't know what for"  . Tubal ligation  1970's  . Dilation and curettage of uterus    . Abdominal hysterectomy  ~ 1974  . Knee arthroscopy  02/2010    right knee; chrondoplasty medial femoral condyle;  Partial medial meniscectomy.   . Coronary angioplasty with stent placement  2011    "in [place]"  . Coronary angioplasty with stent placement  08/2010; 03/25/2013  . Cardiac catheterization  01/18/11  :  Current facility-administered medications:0.9 %  sodium chloride infusion, , Intravenous, Continuous, Lelon Perla, MD, 10 mL/hr at 03/26/13 1812;  acetaminophen (TYLENOL) solution 325-650 mg, 325-650 mg, Oral, Q4H PRN, Laurie Panda, MD;  acetaminophen (TYLENOL) tablet 325-650 mg, 325-650 mg, Oral, Q4H PRN, Laurie Panda, MD, 650 mg at 03/29/13 0926;  [START ON 03/30/2013] amLODipine (NORVASC) tablet 10 mg, 10 mg, Oral, Daily, Sueanne Margarita, MD aspirin chewable tablet 81 mg, 81 mg, Oral, Daily, Burnell Blanks, MD, 81 mg at 03/29/13 0926;  clopidogrel (PLAVIX) tablet 75 mg, 75 mg, Oral, Q breakfast, Burnell Blanks, MD, 75 mg at 03/29/13 0813;  divalproex (DEPAKOTE ER) 24 hr tablet 500 mg, 500 mg, Oral, Daily, Burnell Blanks, MD, 500 mg at 03/29/13 7619;  fentaNYL (SUBLIMAZE) injection 25 mcg, 25 mcg, Intravenous, Q4H PRN, Jules Husbands, MD, 25 mcg at 03/26/13 0806 fentaNYL (SUBLIMAZE) injection 25-50 mcg, 25-50 mcg, Intravenous, Q5 min PRN, Laurie Panda, MD;  hydrALAZINE (APRESOLINE) injection 10 mg, 10 mg, Intravenous, Q6H PRN, Tarri Fuller, PA-C;  HYDROcodone-acetaminophen ([place]) 10-325 MG per tablet 1 tablet, 1 tablet, Oral, Q6H PRN, Burnell Blanks, MD, 1 tablet at 03/29/13 1426 insulin aspart (novoLOG) injection 0-15 Units, 0-15 Units, Subcutaneous, TID WC, Perry Mount, PA-C, 3 Units at 03/29/13 1759;  insulin aspart (novoLOG) injection 4 Units, 4 Units, Subcutaneous, TID WC, Perry Mount,  PA-C, 4 Units at 03/29/13 1251;  latanoprost (XALATAN) 0.005 % ophthalmic solution 1 drop, 1 drop, Both Eyes, QHS, Burnell Blanks, MD, 1 drop at 03/28/13 2148 nitroGLYCERIN (NITROSTAT) SL tablet 0.4 mg, 0.4 mg, Sublingual, Q5 min PRN, Burnell Blanks, MD;  pantoprazole (PROTONIX) EC tablet 40 mg, 40 mg, Oral, Daily, Burnell Blanks, MD, 40 mg at 03/29/13 5093;  polyethylene glycol (MIRALAX / GLYCOLAX) packet 17 g, 17 g, Oral, Daily PRN, Sueanne Margarita, MD;  predniSONE (DELTASONE) tablet 4 mg, 4 mg, Oral, BH-q7a, Burnell Blanks, MD, 4 mg at 03/29/13 0813 rosuvastatin (CRESTOR) tablet 20 mg, 20 mg, Oral, q1800, Burnell Blanks, MD, 20 mg at 03/29/13 1758;  sodium chloride 0.9 % injection 3 mL, 3 mL, Intravenous, Q12H, Burnell Blanks, MD, 3 mL at 03/29/13 0922;  timolol (TIMOPTIC) 0.5 % ophthalmic solution 1 drop, 1 drop, Both Eyes, Daily, Burnell Blanks, MD, 1 drop at 03/29/13 2671:  . [START ON 03/30/2013] amLODipine  10 mg Oral Daily  . aspirin  81 mg Oral Daily  . clopidogrel  75 mg Oral Q breakfast  . divalproex  500 mg Oral Daily  . insulin aspart  0-15 Units Subcutaneous TID WC  . insulin aspart  4 Units Subcutaneous TID WC  . latanoprost  1 drop Both Eyes QHS  . pantoprazole  40 mg Oral Daily  . predniSONE  4 mg Oral BH-q7a  . rosuvastatin  20 mg Oral q1800  . sodium chloride  3 mL Intravenous Q12H  . timolol  1 drop Both Eyes Daily  :  Allergies  Allergen Reactions  . Clarithromycin     REACTION: Vomiting and diarrhea.  . Codeine   . Sulfa Antibiotics   . Atorvastatin Other (See Comments)    Made her knees buckle and muscles ache  :  History reviewed. No pertinent family history.:  History   Social History  . Marital Status: Married    Spouse Name: N/A    Number of Children: N/A  . Years of Education: N/A   Occupational History  . Not on file.   Social History Main Topics  . Smoking status: Never Smoker   . Smokeless  tobacco: Never Used  . Alcohol Use: No  . Drug Use: No  . Sexual Activity: No   Other Topics Concern  . Not on file   Social History Narrative  . No narrative on file  :  Pertinent items are noted in HPI.  Exam: Patient Vitals for the past 24 hrs:  BP Temp Temp src Pulse Resp SpO2  03/29/13 1703 149/52 mmHg 98.2 F (36.8 C) Oral 53  18 97 %  03/29/13 1400 144/60 mmHg - - - 10 -  03/29/13 1200 152/66 mmHg - Oral - 20 96 %  03/29/13 1000 163/39 mmHg - - 53 0 98 %  03/29/13 0943 177/52 mmHg - - 74 - -  03/29/13 0900 177/44 mmHg - - 63 13 99 %  03/29/13 0800 158/47 mmHg 98.4 F (36.9 C) Oral 51 9 93 %  03/29/13 0700 133/37 mmHg - - 52 16 96 %  03/29/13 0611 - - - 53 8 97 %  03/29/13 0600 138/49 mmHg - - 50 12 94 %  03/29/13 0500 153/45 mmHg - - 56 8 94 %  03/29/13 0400 168/72 mmHg 98.7 F (37.1 C) Oral 60 8 98 %  03/29/13 0300 123/37 mmHg - - 51 17 93 %  03/29/13 0200 153/38 mmHg - - 52 0 95 %  03/29/13 0100 125/32 mmHg - - 49 13 94 %  03/29/13 0000 148/36 mmHg 99 F (37.2 C) Oral 52 18 96 %  03/28/13 2317 - - - 57 20 94 %  03/28/13 2300 166/44 mmHg - - 62 12 97 %  03/28/13 2200 153/42 mmHg - - 63 9 94 %  03/28/13 2100 168/40 mmHg - - 67 14 97 %  03/28/13 2011 172/61 mmHg - - 66 13 100 %  03/28/13 2000 191/59 mmHg 98.6 F (37 C) Oral 65 21 98 %   none   Recent Labs  03/28/13 0930 03/29/13 0355  WBC 8.0 7.4  HGB 10.4* 9.8*  HCT 30.3* 28.5*  PLT 67* 82*    Recent Labs  03/27/13 0300 03/29/13 1015  NA 141 139  K 4.0 3.8  CL 106 103  CO2 21 25  GLUCOSE 109* 212*  BUN 21 14  CREATININE 1.54* 1.28*  CALCIUM 8.2* 8.5    Blood smear review: Normal platelet morphology.  Several large platelets.  Normal RBC morphology. (-) schistocytes.  (-) nucleated red cells.  Normal white cells. (-) hypersegmented myeloid.  Normal lymphocytes  Pathology: none   Assessment and Plan: Thrombocytopenia.  Gradually progressive over 3 months.  Likley multi-factorial.  Could  be medicine related.  She is on a lot of meds. She does have PMR. I will check her for APLA.    This recent drop could be due to bleeding and transient hypotension.  This could cause transient thrombocytopenia. If so, I would expect improvement in 2-3 weeks.  She has never had any bleeding otherwise.  I do not think that the right inguinal bleed was from her low platelets.  I do not think that she needs a bone marrow bx.  She blood smear really looked pretty benign.  I do NOT see any evidence of microangiopathic hemolytic issues (TTP).  I think that it will be safe for her to be on ASA/Plavix. This will be important to keep the stent open.  I do not know if a IIB/IIIA inhbitors are used any more during the cardiac cath  I will follow along!!  She has a strong faith!!  Harriette Ohara 17:14

## 2013-03-29 NOTE — Progress Notes (Signed)
Pt admitted to room 2w07, VSS, alert and oriented, oriented to room, call bell  CMS to right leg good with groin dsg intact , Family at bedside no complaints voiced

## 2013-03-29 NOTE — Clinical Documentation Improvement (Signed)
Post Cardiac Catherization, the patient developed a large expanding hematoma.   Patient became hypotensive 77/28  Tachycardic/bradycardic at times (57 to 103)  Respirations ranged from 8-17  Oxygen saturations dropped to 89%; placed in 2L FiO2  500 cc's NS fluid bolus given  Transfused 2 units PRBC's  Please provide a diagnosis associated with the above clinical data and treatment provided if clinically significant.    Cardiogenic Shock Hypovolemic Shock Hemorrhagic Shock Other Condition   Thank You, Zoila Shutter ,RN Clinical Documentation Specialist:  [phone]  [facility]

## 2013-03-29 NOTE — Progress Notes (Signed)
CARDIAC REHAB PHASE I   PRE:  Rate/Rhythm: 52 SB    BP: sitting 161/46    SaO2:   MODE:  Ambulation: 150 ft   POST:  Rate/Rhythm: 66 SR    BP: sitting 179/43     SaO2:   Pt moving fairly well. Stood independently and walked with RW without much c/o. Return to bed for nap. Assist x1 to get in bed. Began ed with pt and daughter. Stent card is missing. Will continue to look for it. [phone]   Darrick Meigs CES, ACSM 03/29/2013 2:18 PM

## 2013-03-29 NOTE — Progress Notes (Addendum)
SUBJECTIVE:   No complaints  OBJECTIVE:   Vitals:   Filed Vitals:   03/29/13 0600 03/29/13 0611 03/29/13 0700 03/29/13 0800  BP: 138/49  133/37 158/47  Pulse: 50 53 52 51  Temp:    98.4 F (36.9 C)  TempSrc:    Oral  Resp: 12 8 16 9   Height:      Weight:      SpO2: 94% 97% 96% 93%   I&O's:   Intake/Output Summary (Last 24 hours) at 03/29/13 8413 Last data filed at 03/29/13 0800  Gross per 24 hour  Intake    960 ml  Output   1875 ml  Net   -915 ml   TELEMETRY: Reviewed telemetry pt in NSR:     PHYSICAL EXAM General: Well developed, well nourished, in no acute distress Head: Eyes PERRLA, No xanthomas.   Normal cephalic and atramatic  Lungs:   Clear bilaterally to auscultation and percussion. Heart:   HRRR S1 S2 Pulses are 2+ & equal. 2/6 SM at RUSB Abdomen: Bowel sounds are positive, abdomen soft and non-tender without masses Extremities:   No clubbing, cyanosis or edema.  DP +1 Large hematoma in right groin that appears stable Neuro: Alert and oriented X 3. Psych:  Good affect, responds appropriately   LABS: Basic Metabolic Panel:  Recent Labs  03/26/13 1028 03/26/13 1104 03/27/13 0300  NA 141 142 141  K 3.7 3.6* 4.0  CL  --   --  106  CO2  --   --  21  GLUCOSE 284*  --  109*  BUN  --   --  21  CREATININE  --   --  1.54*  CALCIUM  --   --  8.2*   Liver Function Tests: No results found for this basename: AST, ALT, ALKPHOS, BILITOT, PROT, ALBUMIN,  in the last 72 hours No results found for this basename: LIPASE, AMYLASE,  in the last 72 hours CBC:  Recent Labs  03/28/13 0930 03/29/13 0355  WBC 8.0 7.4  HGB 10.4* 9.8*  HCT 30.3* 28.5*  MCV 86.8 86.9  PLT 67* 82*   Cardiac Enzymes: No results found for this basename: CKTOTAL, CKMB, CKMBINDEX, TROPONINI,  in the last 72 hours BNP: No components found with this basename: POCBNP,  D-Dimer: No results found for this basename: DDIMER,  in the last 72 hours Hemoglobin A1C: No results found for  this basename: HGBA1C,  in the last 72 hours Fasting Lipid Panel: No results found for this basename: CHOL, HDL, LDLCALC, TRIG, CHOLHDL, LDLDIRECT,  in the last 72 hours Thyroid Function Tests: No results found for this basename: TSH, T4TOTAL, FREET3, T3FREE, THYROIDAB,  in the last 72 hours Anemia Panel: No results found for this basename: VITAMINB12, FOLATE, FERRITIN, TIBC, IRON, RETICCTPCT,  in the last 72 hours Coag Panel:   Lab Results  Component Value Date   INR 0.88 03/25/2013   INR 0.89 03/07/2010    RADIOLOGY: Ct Abdomen Pelvis Wo Contrast  03/26/2013   CLINICAL DATA:  Post catheterization with hematoma.  Pain.  EXAM: CT ABDOMEN AND PELVIS WITHOUT CONTRAST  TECHNIQUE: Multidetector CT imaging of the abdomen and pelvis was performed following the standard protocol without intravenous contrast.  COMPARISON:  No comparison CT abdomen pelvis available for direct review. There is a report from 09/15/2006 exam.  FINDINGS: There is a large right anterior thigh hematoma which measures 11 x 6 x 17 cm. This is superficial to the right calf musculature without deep thigh  a hematoma noted. No evidence of retroperitoneal hematoma. At the level of the right femoral veins from which the hematoma arose, no definitive findings of aneurysm.  Lung bases clear.  Prominent coronary artery calcifications/ stents. Heart size within normal limits. Mitral and aortic valve calcifications.  Ascending thoracic aorta ectatic and measures up to 3.8 cm.  Calcification of the abdominal aorta with ectasia without focal aneurysm. Atherosclerotic type changes common iliac arteries with narrowing greater on the right. Calcification aortic branch vessels. Ectatic splenic artery.  Residual contrast within kidneys from recent catheterization. Heterogeneous appearance of the kidneys without worrisome mass noted.  Contrast within the urinary bladder without obvious mass.  No worrisome splenic, hepatic or adrenal lesion.  Projecting  anteriorly from the pancreatic neck region is a 3 x 2 x 2 cm cystic lesion. This may represent an intra papillary mucinous tumor although incompletely assessed. Follow-up CT in 6 months recommended for further delineation  Scattered colonic diverticula without extra luminal bowel inflammatory process or free air.  Post cholecystectomy, appendectomy and hysterectomy.  Degenerative changes lumbar spine most notable L3-4 and L4-5 level.  IMPRESSION: There is a large right anterior thigh hematoma which measures 11 x 6 x 17 cm. This is superficial to the right calf musculature without deep thigh hematoma noted. No evidence of retroperitoneal hematoma. At the level of the right femoral veins from which the hematoma arose, no definitive findings of aneurysm.  Prominent coronary artery calcifications/ stents.  Ascending thoracic aorta ectatic and measures up to 3.8 cm.  Calcification of the abdominal aorta with ectasia without focal aneurysm. Atherosclerotic type changes common iliac arteries with narrowing greater on the right. Calcification aortic branch vessels. Ectatic splenic artery.  Projecting anteriorly from the pancreatic neck region is a 3 x 2 x 2 cm cystic lesion. This may represent an intra papillary mucinous tumor although incompletely assessed. Follow-up CT in 6 months recommended for further delineation  These results were called by telephone at the time of interpretation on 03/26/2013 at 5:30 AM to Dr. Jules Husbands , who verbally acknowledged these results.   Electronically Signed   By: Chauncey Cruel M.D.   On: 03/26/2013 05:32      ASSESSMENT:  1.  Unstable angina 2.  ASCAD s/p PTCA/DES to proximal LAD 3.  Large right groin hematoma - stable - patient ambulating with PT without difficulty 4.  HTN - BP elevated 5.  DM 6.  Dyslipidemia 7.  Thrombocytopenia ? Etiology - in review of her med list since hospitalization she did not receive any Heparin at time of PCI.  Her platelet count was mildly reduced  at time or admit (?PPI/depakote etiology) and then dropped acutely but is now trending upward.   8.  Mild to moderate AS  PLAN:   1.  She is stable for transfer to tele bed 2.  Continue ASA/statin/Plavix 3.  No beta blocker due to bradycardia 4.  Increase Amlodipine to 10mg  daily for better BP control 5.  Hematology consult for thrombocytopenia  Sueanne Margarita, MD  03/29/2013  9:04 AM

## 2013-03-29 NOTE — Telephone Encounter (Signed)
Pt has Medicare and [employer] MCR supplement.  No precert required.

## 2013-03-29 NOTE — Clinical Documentation Improvement (Signed)
03/29/13  Post Catherization, patient with expanding hematoma in right inguinal area. Returned to OR where evacuation of hematoma, repair of vessel performed.   H&H dropped from 13.2/38.8  to  9.8/28.5 after serial CBC's drawn  Transfused 2 units of PRBC's prior to going to OR.  Please provide a diagnosis associated with above clinical data and treatment provided.   Expected Acute Blood Loss Anemia  Acute Blood Loss Anemia  Acute on chronic blood loss anemia  Chronic blood loss anemia  Other Condition   Thank You, Zoila Shutter ,RN Clinical Documentation Specialist:  [facility] [employer] [facility]

## 2013-03-29 NOTE — Progress Notes (Addendum)
[facility] [place]  Subjective  - Walked some yesterday and sat up in the chair.  She states her right thigh is feeling much better.   Objective 138/49 53 98.7 F (37.1 C) (Oral) 8 97%  Intake/Output Summary (Last 24 hours) at 03/29/13 0753 Last data filed at 03/29/13 0600  Gross per 24 hour  Intake    960 ml  Output   1875 ml  Net   -915 ml    Right thigh soft with decreased ecchymosis.   Distally feet warm to touch sensation intact and equal well perfused. No active bleeding  Assessment/Planning: POD #4 FEMORAL ARTERY EXPLORATION WITH SUTURE REPAIR; EVACUATION OF HEMATOMA (Right) Drain D/C'd yesterday.  Dressing changes daily Increase mobility   Laurence Slate [facility] 03/29/2013 7:53 AM --  Laboratory Lab Results:  Recent Labs  03/28/13 0930 03/29/13 0355  WBC 8.0 7.4  HGB 10.4* 9.8*  HCT 30.3* 28.5*  PLT 67* 82*   BMET  Recent Labs  03/26/13 1028 03/26/13 1104 03/27/13 0300  NA 141 142 141  K 3.7 3.6* 4.0  CL  --   --  106  CO2  --   --  21  GLUCOSE 284*  --  109*  BUN  --   --  21  CREATININE  --   --  1.54*  CALCIUM  --   --  8.2*    COAG Lab Results  Component Value Date   INR 0.88 03/25/2013   INR 0.89 03/07/2010   No results found for this basename: PTT      I have examined the patient, reviewed and agree with above. More comfortable today. Right thigh with minimal swelling. Palpable popliteal pulse. Continue to mobilize.  Andrea Colglazier, MD 03/29/2013 1:12 PM

## 2013-03-29 NOTE — Progress Notes (Signed)
Report called to receiving RN [facility] [id number]. Will transfer via [place]. No complaints at the current time

## 2013-03-30 ENCOUNTER — Encounter (HOSPITAL_COMMUNITY): Payer: Self-pay | Admitting: Vascular Surgery

## 2013-03-30 DIAGNOSIS — K862 Cyst of pancreas: Secondary | ICD-10-CM

## 2013-03-30 DIAGNOSIS — K863 Pseudocyst of pancreas: Secondary | ICD-10-CM

## 2013-03-30 LAB — CBC
HCT: 29.2 % — ABNORMAL LOW (ref 36.0–46.0)
Hemoglobin: 10 g/dL — ABNORMAL LOW (ref 12.0–15.0)
MCH: 29.9 pg (ref 26.0–34.0)
MCHC: 34.2 g/dL (ref 30.0–36.0)
MCV: 87.2 fL (ref 78.0–100.0)
PLATELETS: 104 10*3/uL — AB (ref 150–400)
RBC: 3.35 MIL/uL — AB (ref 3.87–5.11)
RDW: 14.4 % (ref 11.5–15.5)
WBC: 6.3 10*3/uL (ref 4.0–10.5)

## 2013-03-30 LAB — TYPE AND SCREEN
ABO/RH(D): A POS
Antibody Screen: NEGATIVE
UNIT DIVISION: 0
UNIT DIVISION: 0
UNIT DIVISION: 0
Unit division: 0
Unit division: 0
Unit division: 0

## 2013-03-30 LAB — BASIC METABOLIC PANEL
BUN: 14 mg/dL (ref 6–23)
CALCIUM: 9 mg/dL (ref 8.4–10.5)
CO2: 25 meq/L (ref 19–32)
Chloride: 103 mEq/L (ref 96–112)
Creatinine, Ser: 1.19 mg/dL — ABNORMAL HIGH (ref 0.50–1.10)
GFR calc Af Amer: 49 mL/min — ABNORMAL LOW (ref >90)
GFR, EST NON AFRICAN AMERICAN: 43 mL/min — AB (ref >90)
GLUCOSE: 168 mg/dL — AB (ref 70–99)
POTASSIUM: 4 meq/L (ref 3.7–5.3)
SODIUM: 141 meq/L (ref 137–147)

## 2013-03-30 LAB — GLUCOSE, CAPILLARY
GLUCOSE-CAPILLARY: 156 mg/dL — AB (ref 70–99)
GLUCOSE-CAPILLARY: 173 mg/dL — AB (ref 70–99)
Glucose-Capillary: 235 mg/dL — ABNORMAL HIGH (ref 70–99)
Glucose-Capillary: 168 mg/dL — ABNORMAL HIGH (ref 70–99)

## 2013-03-30 MED ORDER — GLIMEPIRIDE 4 MG PO TABS
4.0000 mg | ORAL_TABLET | Freq: Two times a day (BID) | ORAL | Status: DC
  Administered 2013-03-30 – 2013-04-01 (×4): 4 mg via ORAL
  Filled 2013-03-30 (×6): qty 1

## 2013-03-30 MED ORDER — ACETAMINOPHEN 500 MG PO TABS
500.0000 mg | ORAL_TABLET | Freq: Four times a day (QID) | ORAL | Status: DC | PRN
  Administered 2013-03-30 – 2013-04-01 (×7): 500 mg via ORAL
  Filled 2013-03-30 (×7): qty 1

## 2013-03-30 NOTE — Progress Notes (Signed)
Patient ambulated in hall 400 ft with front wheel walker and daughter. Patient tolerated well.

## 2013-03-30 NOTE — Progress Notes (Signed)
Patient: Sharon Hubbard / Admit Date: 03/25/2013 / Date of Encounter: 03/30/2013, 8:34 AM Primary Cardiologist:  Subjective  Feeling stronger every day. Not quite ready to go home. Has been ambulating with PT. Still some R groin pain. SOB from yesterday has improved. No CP.  Objective   Telemetry: SB HR upper 40's to low 60s. No sustained pauses or more significant lowering of HR.  Physical Exam: Blood pressure 136/60, pulse 51, temperature 98.1 F (36.7 C), temperature source Oral, resp. rate 18, height 5\' 3"  (1.6 m), weight 142 lb 3.2 oz (64.5 kg), SpO2 99.00%. General: Well developed, well nourished elderly WF in no acute distress. Head: Normocephalic, atraumatic, sclera non-icteric, no xanthomas, nares are without discharge. Neck: JVP not elevated. Lungs: Clear bilaterally to auscultation without wheezes, rales, or rhonchi. Breathing is unlabored. Heart: RRR S1 preserved S2 with 3/6 SEM RUSB without rubs or gallops.  Abdomen: Soft, non-tender, non-distended with normoactive bowel sounds. No rebound/guarding. Extremities: No clubbing or cyanosis. No edema. R groin with large hematoma, ecchymosis that to be evolving normally, legs warm Neuro: Alert and oriented X 3. Moves all extremities spontaneously. Psych:  Responds to questions appropriately with a normal affect.   Intake/Output Summary (Last 24 hours) at 03/30/13 0834 Last data filed at 03/30/13 0816  Gross per 24 hour  Intake    840 ml  Output    600 ml  Net    240 ml    Inpatient Medications:  . amLODipine  10 mg Oral Daily  . aspirin  81 mg Oral Daily  . clopidogrel  75 mg Oral Q breakfast  . divalproex  500 mg Oral Daily  . glimepiride  4 mg Oral BID  . insulin aspart  0-15 Units Subcutaneous TID WC  . insulin aspart  4 Units Subcutaneous TID WC  . latanoprost  1 drop Both Eyes QHS  . pantoprazole  40 mg Oral Daily  . predniSONE  4 mg Oral BH-q7a  . rosuvastatin  20 mg Oral q1800  . sodium chloride  3 mL  Intravenous Q12H  . timolol  1 drop Both Eyes Daily   Infusions:  . sodium chloride Stopped (03/27/13 1726)    Labs:  Recent Labs  03/29/13 1015 03/30/13 0351  NA 139 141  K 3.8 4.0  CL 103 103  CO2 25 25  GLUCOSE 212* 168*  BUN 14 14  CREATININE 1.28* 1.19*  CALCIUM 8.5 9.0   No results found for this basename: AST, ALT, ALKPHOS, BILITOT, PROT, ALBUMIN,  in the last 72 hours  Recent Labs  03/29/13 0355 03/30/13 0351  WBC 7.4 6.3  HGB 9.8* 10.0*  HCT 28.5* 29.2*  MCV 86.9 87.2  PLT 82* 104*   Radiology/Studies:  Ct Abdomen Pelvis Wo Contrast 03/26/2013   CLINICAL DATA:  Post catheterization with hematoma.  Pain.  EXAM: CT ABDOMEN AND PELVIS WITHOUT CONTRAST  TECHNIQUE: Multidetector CT imaging of the abdomen and pelvis was performed following the standard protocol without intravenous contrast.  COMPARISON:  No comparison CT abdomen pelvis available for direct review. There is a report from 09/15/2006 exam.  FINDINGS: There is a large right anterior thigh hematoma which measures 11 x 6 x 17 cm. This is superficial to the right calf musculature without deep thigh a hematoma noted. No evidence of retroperitoneal hematoma. At the level of the right femoral veins from which the hematoma arose, no definitive findings of aneurysm.  Lung bases clear.  Prominent coronary artery calcifications/ stents. Heart size  within normal limits. Mitral and aortic valve calcifications.  Ascending thoracic aorta ectatic and measures up to 3.8 cm.  Calcification of the abdominal aorta with ectasia without focal aneurysm. Atherosclerotic type changes common iliac arteries with narrowing greater on the right. Calcification aortic branch vessels. Ectatic splenic artery.  Residual contrast within kidneys from recent catheterization. Heterogeneous appearance of the kidneys without worrisome mass noted.  Contrast within the urinary bladder without obvious mass.  No worrisome splenic, hepatic or adrenal lesion.   Projecting anteriorly from the pancreatic neck region is a 3 x 2 x 2 cm cystic lesion. This may represent an intra papillary mucinous tumor although incompletely assessed. Follow-up CT in 6 months recommended for further delineation  Scattered colonic diverticula without extra luminal bowel inflammatory process or free air.  Post cholecystectomy, appendectomy and hysterectomy.  Degenerative changes lumbar spine most notable L3-4 and L4-5 level.  IMPRESSION: There is a large right anterior thigh hematoma which measures 11 x 6 x 17 cm. This is superficial to the right calf musculature without deep thigh hematoma noted. No evidence of retroperitoneal hematoma. At the level of the right femoral veins from which the hematoma arose, no definitive findings of aneurysm.  Prominent coronary artery calcifications/ stents.  Ascending thoracic aorta ectatic and measures up to 3.8 cm.  Calcification of the abdominal aorta with ectasia without focal aneurysm. Atherosclerotic type changes common iliac arteries with narrowing greater on the right. Calcification aortic branch vessels. Ectatic splenic artery.  Projecting anteriorly from the pancreatic neck region is a 3 x 2 x 2 cm cystic lesion. This may represent an intra papillary mucinous tumor although incompletely assessed. Follow-up CT in 6 months recommended for further delineation  These results were called by telephone at the time of interpretation on 03/26/2013 at 5:30 AM to Dr. Jules Husbands , who verbally acknowledged these results.   Electronically Signed   By: Chauncey Cruel M.D.   On: 03/26/2013 05:32     Assessment and Plan  1. CAD/unstable angina s/p PTCA/DES to prox LAD 03/25/13 (history of prior stent placement), severe stenosis in small caliber diagonal branch, too small for PCI; moderate calcified mRCA for medical therapy - recommendation is for ASA and Plavix for at least one year or longer if she tolerates. Continue aspirin, Plavix, statin. No beta blocker due to  baseline bradycardia. 2. R groin hematoma s/p femoral artery exploration with suture repair, evacuation of hematoma 3/6, complicated by hemorrhagic shock and ABL anemia s/p 4 U PRBC - stable. Probably would add iron at discharge (with stool softener). 3. Thrombocytopenia, stable - OK for DAPT per Dr. Marin Olp; some drop may be related to medicines and blood loss. No evidence of TTP. No indication for BMB at this time. 4. Pancreatic neck abnormality on CT - will need OP f/u. Dr. Stanford Breed reports this has been noted previously 5. HTN - Consider resumption of home ACEI at lower dose today. Would hold off on resuming chlorthalidone given volume loss this admission. 6. Diabetes mellitus - resume glimepiride. 7. Dyslipidemia - cont statin. 8. Mild-moderate aortic stenosis by echo 03/10/13 - follow as OP. 9. CKD stage III - stable.   Dispo: Nearing DC, possibly tomorrow. Continue PT - will benefit from HHPT. F/u with Dr. Marin Olp. Appreciate vascular assistance as well.  Signed, Melina Copa PA-C

## 2013-03-30 NOTE — Progress Notes (Signed)
CARDIAC REHAB PHASE I   PRE:  Rate/Rhythm: 54 SB  BP:  Supine: 151/48  Sitting:   Standing:    SaO2: 98%RA  MODE:  Ambulation: 500 ft   POST:  Rate/Rhythm: 65  BP:  Supine:   Sitting: 154/60  Standing:    SaO2: 95%RA 1405-1445 Pt walked 500 ft on RA with rolling walker with steady gait. Tolerated well. No CP. To bed after walk. Will need rolling walker for home use.   Graylon Good, RN BSN  03/30/2013 2:40 PM

## 2013-03-30 NOTE — Progress Notes (Addendum)
[facility] [place]  Subjective  - Still having pain in the right thigh, but she is moving a little better.   Objective 136/60 51 98.1 F (36.7 C) (Oral) 18 99%  Intake/Output Summary (Last 24 hours) at 03/30/13 1340 Last data filed at 03/30/13 1100  Gross per 24 hour  Intake    480 ml  Output    900 ml  Net   -420 ml    Right thigh decreased ecchymosis, soft medial thigh with firmer lateral thigh. Right foot warm and well perfused. No active draiange  Assessment/Planning: POD #5 FEMORAL ARTERY EXPLORATION WITH SUTURE REPAIR; EVACUATION OF HEMATOMA (Right) Slowly increasing sitting tolerance and mobility Clean dry dressing daily   Sharon Hubbard, Sharon Hubbard 03/30/2013 1:40 PM --  Laboratory Lab Results:  Recent Labs  03/29/13 0355 03/30/13 0351  WBC 7.4 6.3  HGB 9.8* 10.0*  HCT 28.5* 29.2*  PLT 82* 104*   BMET  Recent Labs  03/29/13 1015 03/30/13 0351  NA 139 141  K 3.8 4.0  CL 103 103  CO2 25 25  GLUCOSE 212* 168*  BUN 14 14  CREATININE 1.28* 1.19*  CALCIUM 8.5 9.0    COAG Lab Results  Component Value Date   INR 0.88 03/25/2013   INR 0.89 03/07/2010   No results found for this basename: PTT   Agree with above Inguinal wound healing satisfactorily with diffuse ecchymosis from preop hematoma Right foot well-perfused  Probably DC home in about 2 days and patient ambulating better

## 2013-03-30 NOTE — Progress Notes (Addendum)
Pt. Seen and examined. Agree with the NP/PA-C note as written.  Close to d/c .Marland Kitchen Appreciate specialty recommendations. Anticipate d/c home in the am tomorrow if okay with vascular.  Pixie Casino, MD, [facility] Attending Cardiologist [place]

## 2013-03-31 ENCOUNTER — Telehealth: Payer: Self-pay | Admitting: Vascular Surgery

## 2013-03-31 LAB — BASIC METABOLIC PANEL
BUN: 16 mg/dL (ref 6–23)
CO2: 26 meq/L (ref 19–32)
Calcium: 8.8 mg/dL (ref 8.4–10.5)
Chloride: 101 mEq/L (ref 96–112)
Creatinine, Ser: 1.23 mg/dL — ABNORMAL HIGH (ref 0.50–1.10)
GFR calc Af Amer: 47 mL/min — ABNORMAL LOW (ref >90)
GFR, EST NON AFRICAN AMERICAN: 41 mL/min — AB (ref >90)
GLUCOSE: 152 mg/dL — AB (ref 70–99)
Potassium: 3.6 mEq/L — ABNORMAL LOW (ref 3.7–5.3)
SODIUM: 139 meq/L (ref 137–147)

## 2013-03-31 LAB — CBC
HEMATOCRIT: 28.9 % — AB (ref 36.0–46.0)
HEMOGLOBIN: 9.7 g/dL — AB (ref 12.0–15.0)
MCH: 29.5 pg (ref 26.0–34.0)
MCHC: 33.6 g/dL (ref 30.0–36.0)
MCV: 87.8 fL (ref 78.0–100.0)
Platelets: 114 10*3/uL — ABNORMAL LOW (ref 150–400)
RBC: 3.29 MIL/uL — AB (ref 3.87–5.11)
RDW: 14.6 % (ref 11.5–15.5)
WBC: 5.8 10*3/uL (ref 4.0–10.5)

## 2013-03-31 LAB — GLUCOSE, CAPILLARY
GLUCOSE-CAPILLARY: 162 mg/dL — AB (ref 70–99)
Glucose-Capillary: 158 mg/dL — ABNORMAL HIGH (ref 70–99)
Glucose-Capillary: 161 mg/dL — ABNORMAL HIGH (ref 70–99)
Glucose-Capillary: 140 mg/dL — ABNORMAL HIGH (ref 70–99)

## 2013-03-31 LAB — BETA-2-GLYCOPROTEIN I ABS, IGG/M/A
Beta-2 Glyco I IgG: 0 G Units (ref <20)
Beta-2-Glycoprotein I IgA: 5 A Units (ref <20)
Beta-2-Glycoprotein I IgM: 4 M Units (ref <20)

## 2013-03-31 LAB — CARDIOLIPIN ANTIBODIES, IGG, IGM, IGA
ANTICARDIOLIPIN IGA: 6 U/mL — AB (ref <22)
Anticardiolipin IgG: 11 GPL U/mL — ABNORMAL LOW (ref <23)
Anticardiolipin IgM: 1 MPL U/mL — ABNORMAL LOW (ref <11)

## 2013-03-31 NOTE — Progress Notes (Signed)
[facility] Progress Note  03/31/2013 8:10 AM 5 Days Post-Op  Subjective:  Still with a lot of drainage overnight and described by daughter as brown drainage.  Walked a lot yesterday, but winded.  Afebrile VSS  Filed Vitals:   03/31/13 0454  BP: 162/57  Pulse: 54  Temp: 97.7 F (36.5 C)  Resp: 18    Physical Exam: Incisions:  Right groin incision is in tact with staples;  Ecchymosis surrounding incisions. Extremities:  2+ right DP  CBC    Component Value Date/Time   WBC 5.8 03/31/2013 0529   RBC 3.29* 03/31/2013 0529   HGB 9.7* 03/31/2013 0529   HCT 28.9* 03/31/2013 0529   PLT 114* 03/31/2013 0529   MCV 87.8 03/31/2013 0529   MCH 29.5 03/31/2013 0529   MCHC 33.6 03/31/2013 0529   RDW 14.6 03/31/2013 0529   LYMPHSABS 3.2 12/25/2012 1711   MONOABS 0.7 12/25/2012 1711   EOSABS 0.1 12/25/2012 1711   BASOSABS 0.0 12/25/2012 1711    BMET    Component Value Date/Time   NA 139 03/31/2013 0529   K 3.6* 03/31/2013 0529   CL 101 03/31/2013 0529   CO2 26 03/31/2013 0529   GLUCOSE 152* 03/31/2013 0529   BUN 16 03/31/2013 0529   CREATININE 1.23* 03/31/2013 0529   CALCIUM 8.8 03/31/2013 0529   GFRNONAA 41* 03/31/2013 0529   GFRAA 47* 03/31/2013 0529    INR    Component Value Date/Time   INR 0.88 03/25/2013 1036     Intake/Output Summary (Last 24 hours) at 03/31/13 0810 Last data filed at 03/30/13 1901  Gross per 24 hour  Intake   1440 ml  Output    300 ml  Net   1140 ml     Assessment:  80 y.o. female is s/p:  Right FEMORAL ARTERY EXPLORATION WITH SUTURE REPAIR; EVACUATION OF HEMATOMA  5 Days Post-Op  Plan: -acute blood loss anemia is stable -mobilization is improving -still with some drainage - continue to monitor   Leontine Locket, PA-C [facility] [phone] 03/31/2013 8:10 AM

## 2013-03-31 NOTE — Progress Notes (Signed)
CARDIAC REHAB PHASE I   PRE:  Rate/Rhythm: 86 SR up at sink grooming    BP: sitting 159/78    SaO2:   MODE:  Ambulation: 240 ft   POST:  Rate/Rhythm: 87 SR    BP: sitting 161/69     SaO2:   Pt c/o feeling increased weakness today, generally and in right leg. Sts her sudden drainage yesterday "effected her". Fairly flat affect. Stated she could not go as far today due to weakness. Return to recliner. Daughter present doting on pt. [phone]  Darrick Meigs CES, ACSM 03/31/2013 9:45 AM

## 2013-03-31 NOTE — Progress Notes (Signed)
Pt up ambulating in hallway with daughter at this time and rolling walker; steady gait noted; will cont. To monitor.

## 2013-03-31 NOTE — Progress Notes (Signed)
Pt up ambulating in hallway at this time with daughter; will cont. To monitor.

## 2013-03-31 NOTE — Progress Notes (Signed)
Patient got up to go to the bathroom and had moderate  amount of serosanguinous drainage from the JP drain site on her right thigh. Changed dressing on the site. Will continue to monitor for any further drainage.

## 2013-03-31 NOTE — Progress Notes (Addendum)
DAILY PROGRESS NOTE  Subjective:  Moderate amount of serosanguinous drainage from her wound around the drain.   Objective:  Temp:  [97.7 F (36.5 C)-98.2 F (36.8 C)] 97.7 F (36.5 C) (03/11 0454) Pulse Rate:  [54-56] 54 (03/11 0454) Resp:  [18-20] 18 (03/11 0454) BP: (145-162)/(57-62) 162/57 mmHg (03/11 0454) SpO2:  [99 %-100 %] 100 % (03/11 0454) Weight change:   Intake/Output from previous day: 03/10 0701 - 03/11 0700 In: 1440 [P.O.:1440] Out: 300 [Urine:300]  Intake/Output from this shift:    Medications: Current Facility-Administered Medications  Medication Dose Route Frequency Provider Last Rate Last Dose  . 0.9 %  sodium chloride infusion   Intravenous Continuous Lelon Perla, MD   10 mL/hr at 03/26/13 1812  . acetaminophen (TYLENOL) tablet 500 mg  500 mg Oral Q6H PRN Dayna N Dunn, PA-C   500 mg at 03/31/13 0445  . amLODipine (NORVASC) tablet 10 mg  10 mg Oral Daily Sueanne Margarita, MD   10 mg at 03/30/13 1129  . aspirin chewable tablet 81 mg  81 mg Oral Daily Burnell Blanks, MD   81 mg at 03/30/13 1135  . clopidogrel (PLAVIX) tablet 75 mg  75 mg Oral Q breakfast Burnell Blanks, MD   75 mg at 03/30/13 9476  . divalproex (DEPAKOTE ER) 24 hr tablet 500 mg  500 mg Oral Daily Burnell Blanks, MD   500 mg at 03/30/13 1128  . fentaNYL (SUBLIMAZE) injection 25 mcg  25 mcg Intravenous Q4H PRN Jules Husbands, MD   25 mcg at 03/26/13 0806  . glimepiride (AMARYL) tablet 4 mg  4 mg Oral BID WC Dayna N Dunn, PA-C   4 mg at 03/31/13 0707  . hydrALAZINE (APRESOLINE) injection 10 mg  10 mg Intravenous Q6H PRN Tarri Fuller, PA-C      . HYDROcodone-acetaminophen (NORCO) 10-325 MG per tablet 1 tablet  1 tablet Oral Q6H PRN Burnell Blanks, MD   1 tablet at 03/31/13 0707  . insulin aspart (novoLOG) injection 0-15 Units  0-15 Units Subcutaneous TID WC Perry Mount, PA-C   3 Units at 03/31/13 0707  . insulin aspart (novoLOG) injection 4 Units  4 Units  Subcutaneous TID WC Perry Mount, PA-C   4 Units at 03/30/13 1707  . latanoprost (XALATAN) 0.005 % ophthalmic solution 1 drop  1 drop Both Eyes QHS Burnell Blanks, MD   1 drop at 03/30/13 2107  . nitroGLYCERIN (NITROSTAT) SL tablet 0.4 mg  0.4 mg Sublingual Q5 min PRN Burnell Blanks, MD      . pantoprazole (PROTONIX) EC tablet 40 mg  40 mg Oral Daily Burnell Blanks, MD   40 mg at 03/30/13 1135  . polyethylene glycol (MIRALAX / GLYCOLAX) packet 17 g  17 g Oral Daily PRN Sueanne Margarita, MD      . predniSONE (DELTASONE) tablet 4 mg  4 mg Oral BH-q7a Burnell Blanks, MD   4 mg at 03/31/13 0708  . rosuvastatin (CRESTOR) tablet 20 mg  20 mg Oral q1800 Burnell Blanks, MD   20 mg at 03/30/13 1708  . sodium chloride 0.9 % injection 3 mL  3 mL Intravenous Q12H Burnell Blanks, MD   3 mL at 03/30/13 2110  . timolol (TIMOPTIC) 0.5 % ophthalmic solution 1 drop  1 drop Both Eyes Daily Burnell Blanks, MD   1 drop at 03/30/13 1127    Physical Exam: General appearance: alert and no distress  Lungs: clear to auscultation bilaterally Heart: regular rate and rhythm and systolic murmur: early systolic 3/6, crescendo at 2nd right intercostal space Abdomen: soft, non-tender; bowel sounds normal; no masses,  no organomegaly Extremities: right thigh wound with some serosanguinous drainage  Lab Results: Results for orders placed during the hospital encounter of 03/25/13 (from the past 48 hour(s))  BASIC METABOLIC PANEL     Status: Abnormal   Collection Time    03/29/13 10:15 AM      Result Value Ref Range   Sodium 139  137 - 147 mEq/L   Potassium 3.8  3.7 - 5.3 mEq/L   Chloride 103  96 - 112 mEq/L   CO2 25  19 - 32 mEq/L   Glucose, Bld 212 (*) 70 - 99 mg/dL   BUN 14  6 - 23 mg/dL   Creatinine, Ser 1.28 (*) 0.50 - 1.10 mg/dL   Calcium 8.5  8.4 - 10.5 mg/dL   GFR calc non Af Amer 39 (*) >90 mL/min   GFR calc Af Amer 45 (*) >90 mL/min   Comment: (NOTE)      The eGFR has been calculated using the CKD EPI equation.     This calculation has not been validated in all clinical situations.     eGFR's persistently <90 mL/min signify possible Chronic Kidney     Disease.  SAVE SMEAR     Status: None   Collection Time    03/29/13 12:13 PM      Result Value Ref Range   Smear Review SMEAR STAINED AND AVAILABLE FOR REVIEW    GLUCOSE, CAPILLARY     Status: Abnormal   Collection Time    03/29/13 12:45 PM      Result Value Ref Range   Glucose-Capillary 184 (*) 70 - 99 mg/dL  GLUCOSE, CAPILLARY     Status: Abnormal   Collection Time    03/29/13  5:25 PM      Result Value Ref Range   Glucose-Capillary 188 (*) 70 - 99 mg/dL   Comment 1 Documented in Chart     Comment 2 Notify RN    GLUCOSE, CAPILLARY     Status: Abnormal   Collection Time    03/29/13  9:03 PM      Result Value Ref Range   Glucose-Capillary 222 (*) 70 - 99 mg/dL   Comment 1 Documented in Chart     Comment 2 Notify RN    CBC     Status: Abnormal   Collection Time    03/30/13  3:51 AM      Result Value Ref Range   WBC 6.3  4.0 - 10.5 K/uL   RBC 3.35 (*) 3.87 - 5.11 MIL/uL   Hemoglobin 10.0 (*) 12.0 - 15.0 g/dL   HCT 29.2 (*) 36.0 - 46.0 %   MCV 87.2  78.0 - 100.0 fL   MCH 29.9  26.0 - 34.0 pg   MCHC 34.2  30.0 - 36.0 g/dL   RDW 14.4  11.5 - 15.5 %   Platelets 104 (*) 150 - 400 K/uL   Comment: CONSISTENT WITH PREVIOUS RESULT  BASIC METABOLIC PANEL     Status: Abnormal   Collection Time    03/30/13  3:51 AM      Result Value Ref Range   Sodium 141  137 - 147 mEq/L   Potassium 4.0  3.7 - 5.3 mEq/L   Chloride 103  96 - 112 mEq/L   CO2 25  19 -  32 mEq/L   Glucose, Bld 168 (*) 70 - 99 mg/dL   BUN 14  6 - 23 mg/dL   Creatinine, Ser 1.19 (*) 0.50 - 1.10 mg/dL   Calcium 9.0  8.4 - 10.5 mg/dL   GFR calc non Af Amer 43 (*) >90 mL/min   GFR calc Af Amer 49 (*) >90 mL/min   Comment: (NOTE)     The eGFR has been calculated using the CKD EPI equation.     This calculation has not  been validated in all clinical situations.     eGFR's persistently <90 mL/min signify possible Chronic Kidney     Disease.  GLUCOSE, CAPILLARY     Status: Abnormal   Collection Time    03/30/13  6:08 AM      Result Value Ref Range   Glucose-Capillary 156 (*) 70 - 99 mg/dL  GLUCOSE, CAPILLARY     Status: Abnormal   Collection Time    03/30/13 11:32 AM      Result Value Ref Range   Glucose-Capillary 173 (*) 70 - 99 mg/dL   Comment 1 Notify RN    GLUCOSE, CAPILLARY     Status: Abnormal   Collection Time    03/30/13  4:40 PM      Result Value Ref Range   Glucose-Capillary 235 (*) 70 - 99 mg/dL   Comment 1 Documented in Chart     Comment 2 Notify RN    GLUCOSE, CAPILLARY     Status: Abnormal   Collection Time    03/30/13  9:08 PM      Result Value Ref Range   Glucose-Capillary 168 (*) 70 - 99 mg/dL   Comment 1 Documented in Chart     Comment 2 Notify RN    CBC     Status: Abnormal   Collection Time    03/31/13  5:29 AM      Result Value Ref Range   WBC 5.8  4.0 - 10.5 K/uL   RBC 3.29 (*) 3.87 - 5.11 MIL/uL   Hemoglobin 9.7 (*) 12.0 - 15.0 g/dL   HCT 28.9 (*) 36.0 - 46.0 %   MCV 87.8  78.0 - 100.0 fL   MCH 29.5  26.0 - 34.0 pg   MCHC 33.6  30.0 - 36.0 g/dL   RDW 14.6  11.5 - 15.5 %   Platelets 114 (*) 150 - 400 K/uL   Comment: CONSISTENT WITH PREVIOUS RESULT  BASIC METABOLIC PANEL     Status: Abnormal   Collection Time    03/31/13  5:29 AM      Result Value Ref Range   Sodium 139  137 - 147 mEq/L   Potassium 3.6 (*) 3.7 - 5.3 mEq/L   Chloride 101  96 - 112 mEq/L   CO2 26  19 - 32 mEq/L   Glucose, Bld 152 (*) 70 - 99 mg/dL   BUN 16  6 - 23 mg/dL   Creatinine, Ser 1.23 (*) 0.50 - 1.10 mg/dL   Calcium 8.8  8.4 - 10.5 mg/dL   GFR calc non Af Amer 41 (*) >90 mL/min   GFR calc Af Amer 47 (*) >90 mL/min   Comment: (NOTE)     The eGFR has been calculated using the CKD EPI equation.     This calculation has not been validated in all clinical situations.     eGFR's  persistently <90 mL/min signify possible Chronic Kidney     Disease.  GLUCOSE, CAPILLARY  Status: Abnormal   Collection Time    03/31/13  6:12 AM      Result Value Ref Range   Glucose-Capillary 158 (*) 70 - 99 mg/dL    Imaging: No results found.  Assessment:  Active Problems:   Unstable angina   Unstable angina pectoris   Thrombocytopenia   Hematoma of groin   Plan:  1. Sharon Hubbard feels weak, has a moderate amount of pain. She had some serosanguinous drainage from her right thigh overnight. She is concerned about going home without services. PT has evaluated on 3/9 and recommended home.  Discharge home when services have been arranged, may be tomorrow.  Time Spent Directly with Patient:  15 minutes  Length of Stay:  LOS: 6 days   Pixie Casino, MD, [facility] Attending Cardiologist [facility]  Blia Totman C 03/31/2013, 8:08 AM

## 2013-03-31 NOTE — Progress Notes (Signed)
Physical Therapy Treatment Patient Details Name: Sharon Hubbard MRN: [id number] DOB: [birth year] Today's Date: 03/31/2013 Time: 7628-3151 PT Time Calculation (min): 19 min  PT Assessment / Plan / Recommendation  History of Present Illness Pt with rt groin hematoma after heart cath underwent FEMORAL ARTERY EXPLORATION WITH SUTURE REPAIR; EVACUATION OF HEMATOMA on 03/26/13.   PT Comments   All education needed to get home safely completed.  Pt feels most confident with the RW at this point and will continue to use at home.   Follow Up Recommendations  Home health PT     Does the patient have the potential to tolerate intense rehabilitation     Barriers to Discharge        Equipment Recommendations  None recommended by PT    Recommendations for Other Services    Frequency Min 3X/week   Progress towards PT Goals Progress towards PT goals: Progressing toward goals  Plan Current plan remains appropriate    Precautions / Restrictions Precautions Precautions: Fall Restrictions Weight Bearing Restrictions: No   Pertinent Vitals/Pain     Mobility  Transfers Overall transfer level: Needs assistance Transfers: Sit to/from Stand Sit to Stand: Min guard General transfer comment: cues for hand placement Ambulation/Gait Ambulation/Gait assistance: Supervision (min with no device) Ambulation Distance (Feet): 280 Feet Assistive device: Rolling walker (2 wheeled);None Gait velocity interpretation: Below normal speed for age/gender General Gait Details: Generally steady gait with RW and mildly unsteady with none or MIN HHA.  mildly antalgic as R LE began to hurt more Stairs: Yes Stairs assistance: Min assist Stair Management: No rails;Step to pattern;Forwards Number of Stairs: 4 General stair comments: Also talked about using collapse RW for supplemental rail on one side due to pt not confident on the steps coming down.    Exercises General Exercises - Lower Extremity Hip  Flexion/Marching: AROM;Strengthening;Both;10 reps;Standing   PT Diagnosis:    PT Problem List:   PT Treatment Interventions:     PT Goals (current goals can now be found in the care plan section) Acute Rehab PT Goals Patient Stated Goal: Return home PT Goal Formulation: With patient Time For Goal Achievement: 04/04/13 Potential to Achieve Goals: Good  Visit Information  Last PT Received On: 03/31/13 Assistance Needed: +1 History of Present Illness: Pt with rt groin hematoma after heart cath underwent FEMORAL ARTERY EXPLORATION WITH SUTURE REPAIR; EVACUATION OF HEMATOMA on 03/26/13.    Subjective Data  Subjective: That leg still hurts a good bit. Patient Stated Goal: Return home   Cognition  Cognition Arousal/Alertness: Awake/alert Behavior During Therapy: WFL for tasks assessed/performed Overall Cognitive Status: Within Functional Limits for tasks assessed    Balance  Balance Overall balance assessment: Needs assistance Sitting-balance support: Feet supported Sitting balance-Leahy Scale: Good Standing balance support: Single extremity supported;During functional activity Standing balance-Leahy Scale: Fair  End of Session PT - End of Session Equipment Utilized During Treatment: Gait belt Activity Tolerance: Patient tolerated treatment well Patient left: in chair;with call bell/phone within reach;with family/visitor present Nurse Communication: Mobility status   GP     Clotilde Loth, Tessie Fass 03/31/2013, 1:17 PM 03/31/2013  Donnella Sham, [place] [phone]  (pager)

## 2013-03-31 NOTE — Progress Notes (Signed)
Per pt and pt's daughter; pt's daughter assisted pt with bathing at this time; will cont. To monitor.

## 2013-03-31 NOTE — Telephone Encounter (Addendum)
Message copied by Gena Fray on Wed Mar 31, 2013  3:36 PM ------      Message from: Peter Minium K      Created: Wed Mar 31, 2013  8:44 AM      Regarding: schedule                   ----- Message -----         From: Gabriel Earing, PA-C         Sent: 03/31/2013   8:21 AM           To: [facility] Charge Pool            S/p FEMORAL ARTERY EXPLORATION WITH SUTURE REPAIR; EVACUATION OF HEMATOMA 03/29/13.  F/u with Dr. Kellie Simmering in 2 weeks.  Will need to have staples removed at that time.            Thanks,      Samantha ------  03/31/13: lm for pt re appt, dpm

## 2013-03-31 NOTE — Progress Notes (Signed)
Nutrition Brief Note  Patient identified on the Malnutrition Screening Tool (MST) Report for recent weight lost without trying (patient unsure).  Per weight readings, patient's weight has been stable.  Wt Readings from Last 15 Encounters:  03/26/13 142 lb 3.2 oz (64.5 kg)  03/26/13 142 lb 3.2 oz (64.5 kg)  03/26/13 142 lb 3.2 oz (64.5 kg)  03/23/13 144 lb (65.318 kg)  03/05/13 146 lb 12 oz (66.565 kg)  12/25/12 140 lb (63.504 kg)  08/27/12 144 lb 1.9 oz (65.372 kg)  02/28/12 149 lb (67.586 kg)  02/17/12 150 lb 6.4 oz (68.221 kg)  08/06/11 150 lb 12.8 oz (68.402 kg)  02/20/11 161 lb (73.029 kg)  01/26/11 156 lb 4.9 oz (70.9 kg)  01/19/11 164 lb 0.4 oz (74.4 kg)  01/19/11 164 lb 0.4 oz (74.4 kg)  10/05/10 165 lb (74.844 kg)    Body mass index is 25.2 kg/(m^2). Patient meets criteria for Overweight based on current BMI.   Current diet order is Carbohydrate Modified, patient is consuming approximately 75-100% of meals at this time. Labs and medications reviewed.   No nutrition interventions warranted at this time. If nutrition issues arise, please consult RD.   Arthur Holms, RD, LDN Pager #: [phone] After-Hours Pager #: [phone]

## 2013-04-01 LAB — GLUCOSE, CAPILLARY
Glucose-Capillary: 149 mg/dL — ABNORMAL HIGH (ref 70–99)
Glucose-Capillary: 198 mg/dL — ABNORMAL HIGH (ref 70–99)

## 2013-04-01 LAB — BASIC METABOLIC PANEL
BUN: 20 mg/dL (ref 6–23)
CALCIUM: 8.8 mg/dL (ref 8.4–10.5)
CO2: 27 mEq/L (ref 19–32)
Chloride: 102 mEq/L (ref 96–112)
Creatinine, Ser: 1.11 mg/dL — ABNORMAL HIGH (ref 0.50–1.10)
GFR, EST AFRICAN AMERICAN: 54 mL/min — AB (ref >90)
GFR, EST NON AFRICAN AMERICAN: 46 mL/min — AB (ref >90)
Glucose, Bld: 158 mg/dL — ABNORMAL HIGH (ref 70–99)
Potassium: 3.9 mEq/L (ref 3.7–5.3)
Sodium: 143 mEq/L (ref 137–147)

## 2013-04-01 LAB — CBC
HCT: 28.8 % — ABNORMAL LOW (ref 36.0–46.0)
HEMOGLOBIN: 9.7 g/dL — AB (ref 12.0–15.0)
MCH: 29.7 pg (ref 26.0–34.0)
MCHC: 33.7 g/dL (ref 30.0–36.0)
MCV: 88.1 fL (ref 78.0–100.0)
Platelets: 134 10*3/uL — ABNORMAL LOW (ref 150–400)
RBC: 3.27 MIL/uL — ABNORMAL LOW (ref 3.87–5.11)
RDW: 14.5 % (ref 11.5–15.5)
WBC: 5.8 10*3/uL (ref 4.0–10.5)

## 2013-04-01 NOTE — Progress Notes (Signed)
Sharon Hubbard platelet count is coming up nicely. I have to suspect that the transient drop from this large bleed that she had with the procedure.  For anti-cardiolipin antibody assays were normal.   Her platelet count is 134,000 today.  We will go ahead and sign off. I do not see any problems with her being on antiplatelet therapy.  She is very nice. We have been fellowship. I will continue to pray for her. Hopefully she will be able to go home soon.  Waldemar Dickens [phone]

## 2013-04-01 NOTE — Progress Notes (Signed)
Spoke briefly to patient and daughter regarding diabetes management.  Patient recently taken off Lantus insulin due to low CBG's in the AM.  Now she takes Novolog 10 units tid with meals (Only if CBG greater than 180 mg/dL).  Daughter concerned that CBG's are going up greater than 180 mg/dL.  Discussed with patient that she likely needs to talk with MD about whether she should take 1/2 of Novolog if CBG's between 100-180 mg/dL.  She verbalized understanding.  See's MD in [place].  She states that her last A1C was 6.8% indicating good glycemic control.  She tests CBG's 5-6 times daily and states that her AM CBG's are usually around 100 mg/dL.  She also states that she has been eating too many sweets and plans to cut back.  She seems very independent and knows a lot about caring for her diabetes.  Discussed with daughter also.  Adah Perl, RN, BC-ADM Inpatient Diabetes Coordinator Pager [phone]

## 2013-04-01 NOTE — Progress Notes (Signed)
Pt/family given discharge instructions, medication lists, follow up appointments, and when to call the doctor.  Pt/family verbalizes understanding. Pt given signs and symptoms of infection. Called diabetes coordinator per daughter's request. All questioned answered. Payton Emerald, RN

## 2013-04-01 NOTE — Discharge Summary (Signed)
Physician Discharge Summary     Patient ID: Sharon Hubbard MRN: [id number] DOB/AGE: 78-Sep-1935 78 y.o. Cardiologist:  Ron Parker   Admit date: 03/25/2013 Discharge date: 04/01/2013  Admission Diagnoses: Unstable Angina  Discharge Diagnoses:  Active Problems: 1. Unstable angina  2. ASCAD s/p PTCA/DES to proximal LAD  3. Large right groin hematoma -  4. HTN -   5. DM  6. Dyslipidemia  7. Thrombocytopenia .  8. Mild to moderate AS      Discharged Condition: stable  Hospital Course:   Patient is a 80yo female with a history of HTN, AS, bradycardia, CAD, DM, dyslipidemia, RBBB.    She was seen by Dr. Ron Parker for followup of CAD and exertional shortness of breath. He saw her last March 05, 2013 and adjusted her diuretics. She had less edema.  She continues to have very significant exertional shortness of breath. Her daughter was in the room and documents this clearly. She said that she felt like she did before her last coronary stent. She does get chest heaviness with her exertional shortness of breath.  At the time of her last visit Dr. Ron Parker also arranged for a followup 2-D echo. Ejection fraction is 65%. She has mild to moderate aortic stenosis. This may be slightly worse than the past.   The patient ended up being admitted and underwent left heart cath revealing severe stenosis proximal LAD and had successful PTCA/DES x 1 proximal LAD.  Also,severe stenosis in small caliber diagonal branch, too small for PCI.  Moderate calcified stenosis mid RCA.  The patient developed a groin hematoma with considerable pain.  IV Fentanyl was given.  Pressure was held.  CT of the ABD/pelvis revealed a "large right anterior thigh hematoma which measures 11 x 6 x 17 cm. Also noted is projecting anteriorly from the pancreatic neck region is a 3 x 2 x 2 cm cystic lesion. This may represent an intra papillary mucinous tumor although incompletely assessed. Follow-up CT in 6 months recommended for further  delineation".  No retroperitoneal bleed was seen.  She was transferred to the CCU.   She became hypotensive.  BP meds were held and a 552ml bolus on NS given. Dopamine started.  Hgb dropped to 6.1.  She was transfused PRBCs.  She was taken to the OR by Dr. Oneida Alar for exploration of groin and evacuation of hematoma and control of bleeding.  Hgb improved and was stable.  She was seen by PT who thought she would progress quickly.  Popliteal pulse was good.  HTN meds were restarted.  Amlodipine was increased to 10mg .  ASA/plavix continued.  The patient was seen by Dr. Debara Pickett who felt she was stable for DC home.    Follow up in six months will be needed for the pancreatic cyst.   Consults: Vascular Surgery, Cardiac rehab, PT  Significant Diagnostic Studies:  Left/Right heart cath Hemodynamic Findings:  Ao: 172/66  LV: 181/13/14  RA: 4  RV: 23/1/1  PA: 22/7 (mean 13)  PCWP: 5  Fick Cardiac Output: 5.48 L/min  Fick Cardiac Index: 3.22 L/min/m2  Central Aortic Saturation: 92%  Pulmonary Artery Saturation: 69%  Angiographic Findings:  Left main: No obstructive disease.  Left Anterior Descending Artery: Large caliber vessel that courses to the apex. The proximal vessel has an eccentric 90% stenosis. The mid stented segment is patent with minimal restenosis. The small caliber diagonal branch has 95% stenosis, unchanged from last cath.  Circumflex Artery: Large caliber vessel with small caliber first OM  branch and moderate caliber second OM branch. The first OM branch has a focal 50% stenosis. The proximal AV groove Circumflex is heavily calcified.  Right Coronary Artery: Large, dominant vessel with 30% proximal stenosis, 60% calcified mid stenosis. The distal vessel has mild plaque.  Left Ventricular Angiogram: Deferred.  Impression:  1. Severe stenosis proximal LAD now s/p successful PTCA/DES x 1 proximal LAD  2. Severe stenosis in small caliber diagonal branch, too small for PCI  3. Moderate  calcified stenosis mid RCA  Recommendations: She will need continued therapy with ASA and Plavix for at least one year but longer if she tolerates well. Continue current therapy.  Complications: None; patient tolerated the procedure well.   CT ABDOMEN AND PELVIS WITHOUT CONTRAST  TECHNIQUE: Multidetector CT imaging of the abdomen and pelvis was performed following the standard protocol without intravenous contrast.  COMPARISON: No comparison CT abdomen pelvis available for direct review. There is a report from 09/15/2006 exam.  FINDINGS: There is a large right anterior thigh hematoma which measures 11 x 6 x 17 cm. This is superficial to the right calf musculature without deep thigh a hematoma noted. No evidence of retroperitoneal hematoma. At the level of the right femoral veins from which the hematoma arose, no definitive findings of aneurysm.  Lung bases clear.  Prominent coronary artery calcifications/ stents. Heart size within normal limits. Mitral and aortic valve calcifications.  Ascending thoracic aorta ectatic and measures up to 3.8 cm.  Calcification of the abdominal aorta with ectasia without focal aneurysm. Atherosclerotic type changes common iliac arteries with narrowing greater on the right. Calcification aortic branch vessels. Ectatic splenic artery.  Residual contrast within kidneys from recent catheterization. Heterogeneous appearance of the kidneys without worrisome mass noted.  Contrast within the urinary bladder without obvious mass.  No worrisome splenic, hepatic or adrenal lesion.  Projecting anteriorly from the pancreatic neck region is a 3 x 2 x 2 cm cystic lesion. This may represent an intra papillary mucinous tumor although incompletely assessed. Follow-up CT in 6 months recommended for further delineation  Scattered colonic diverticula without extra luminal bowel inflammatory process or free air.  Post cholecystectomy, appendectomy and  hysterectomy.  Degenerative changes lumbar spine most notable L3-4 and L4-5 level.  IMPRESSION: There is a large right anterior thigh hematoma which measures 11 x 6 x 17 cm. This is superficial to the right calf musculature without deep thigh hematoma noted. No evidence of retroperitoneal hematoma. At the level of the right femoral veins from which the hematoma arose, no definitive findings of aneurysm.  Prominent coronary artery calcifications/ stents.  Ascending thoracic aorta ectatic and measures up to 3.8 cm.  Calcification of the abdominal aorta with ectasia without focal aneurysm. Atherosclerotic type changes common iliac arteries with narrowing greater on the right. Calcification aortic branch vessels. Ectatic splenic artery.  Projecting anteriorly from the pancreatic neck region is a 3 x 2 x 2 cm cystic lesion. This may represent an intra papillary mucinous tumor although incompletely assessed. Follow-up CT in 6 months recommended for further delineation  These results were called by telephone at the time of interpretation on 03/26/2013 at 5:30 AM to Dr. Jules Husbands , who verbally acknowledged these results.    Treatments: See above  Discharge Exam: Blood pressure 132/49, pulse 55, temperature 98 F (36.7 C), temperature source Oral, resp. rate 18, height 5\' 3"  (1.6 m), weight 142 lb 3.2 oz (64.5 kg), SpO2 100.00%.   Disposition: 01-Home or Self Care  Discharge Orders  Future Appointments Provider Department Dept Phone   04/13/2013 10:45 AM Mal Misty, MD [facility] [phone]   04/27/2013 3:45 PM Carlena Bjornstad, MD [facility] [phone]   Future Orders Complete By Expires   Diet - low sodium heart healthy  As directed    Discharge instructions  As directed    Comments:     You will be set up for Home Health RN and PT services.   Increase activity slowly  As directed        Medication List    STOP  taking these medications       chlorthalidone 25 MG tablet  Commonly known as:  HYGROTON     isosorbide mononitrate 60 MG 24 hr tablet  Commonly known as:  IMDUR     lisinopril 20 MG tablet  Commonly known as:  PRINIVIL,ZESTRIL      TAKE these medications       amLODipine 10 MG tablet  Commonly known as:  NORVASC  Take 10 mg by mouth daily.     aspirin 81 MG tablet  Take 81 mg by mouth daily.     cholecalciferol 1000 UNITS tablet  Commonly known as:  VITAMIN D  Take 1,000 Units by mouth daily.     clopidogrel 75 MG tablet  Commonly known as:  PLAVIX  TAKE ONE TABLET (75MG ) BY MOUTH DAILY     divalproex 500 MG 24 hr tablet  Commonly known as:  DEPAKOTE ER  Take 500 mg by mouth daily.     furosemide 40 MG tablet  Commonly known as:  LASIX  Take 1 tablet (40 mg total) by mouth daily.     glimepiride 4 MG tablet  Commonly known as:  AMARYL  Take 4 mg by mouth 2 (two) times daily.     HYDROcodone-acetaminophen 10-325 MG per tablet  Commonly known as:  NORCO  Take 1 tablet by mouth every 6 (six) hours as needed for pain.     insulin lispro 100 UNIT/ML injection  Commonly known as:  HUMALOG  Inject 0-10 Units into the skin 3 (three) times daily before meals. Pt is on a sliding scale     latanoprost 0.005 % ophthalmic solution  Commonly known as:  XALATAN  Place 1 drop into both eyes at bedtime.     nitroGLYCERIN 0.4 MG SL tablet  Commonly known as:  NITROSTAT  Place 0.4 mg under the tongue every 5 (five) minutes as needed for chest pain.     pantoprazole 40 MG tablet  Commonly known as:  PROTONIX  Take 40 mg by mouth daily.     predniSONE 1 MG tablet  Commonly known as:  DELTASONE  Take 4 mg by mouth every morning.     rosuvastatin 20 MG tablet  Commonly known as:  CRESTOR  Take 20 mg by mouth at bedtime.     timolol 0.5 % ophthalmic solution  Commonly known as:  TIMOPTIC  Place 1 drop into both eyes daily.           Follow-up Information    Follow up with Tinnie Gens, MD In 2 weeks. (Office will call you to arrange your appt (sent))    Specialty:  Vascular Surgery   Contact information:   [address] [place] [address] [phone]       Follow up with Dola Argyle, MD On 04/27/2013. (3:45PM)    Specialty:  Cardiology   Contact information:  [place]. [address] [place] [address] [phone]       Signed: Tarri Fuller 04/01/2013, 9:52 AM

## 2013-04-01 NOTE — Progress Notes (Signed)
DAILY PROGRESS NOTE  Subjective:  Platelet count is rebounding. No further bleeding. She has ambulated with PT who feel there is no barrier to d/c.   Objective:  Temp:  [98 F (36.7 C)] 98 F (36.7 C) (03/12 0534) Pulse Rate:  [54-55] 55 (03/12 0534) Resp:  [18-20] 18 (03/12 0534) BP: (132-156)/(49-62) 132/49 mmHg (03/12 0534) SpO2:  [100 %] 100 % (03/12 0534) Weight change:   Intake/Output from previous day: 03/11 0701 - 03/12 0700 In: 840 [P.O.:840] Out: -   Intake/Output from this shift:    Medications: Current Facility-Administered Medications  Medication Dose Route Frequency Provider Last Rate Last Dose  . 0.9 %  sodium chloride infusion   Intravenous Continuous Lelon Perla, MD   10 mL/hr at 03/26/13 1812  . acetaminophen (TYLENOL) tablet 500 mg  500 mg Oral Q6H PRN Dayna N Dunn, PA-C   500 mg at 04/01/13 0657  . amLODipine (NORVASC) tablet 10 mg  10 mg Oral Daily Sueanne Margarita, MD   10 mg at 03/31/13 1104  . aspirin chewable tablet 81 mg  81 mg Oral Daily Burnell Blanks, MD   81 mg at 03/31/13 1103  . clopidogrel (PLAVIX) tablet 75 mg  75 mg Oral Q breakfast Burnell Blanks, MD   75 mg at 03/31/13 0835  . divalproex (DEPAKOTE ER) 24 hr tablet 500 mg  500 mg Oral Daily Burnell Blanks, MD   500 mg at 03/31/13 1103  . fentaNYL (SUBLIMAZE) injection 25 mcg  25 mcg Intravenous Q4H PRN Jules Husbands, MD   25 mcg at 03/26/13 0806  . glimepiride (AMARYL) tablet 4 mg  4 mg Oral BID WC Dayna N Dunn, PA-C   4 mg at 03/31/13 1742  . hydrALAZINE (APRESOLINE) injection 10 mg  10 mg Intravenous Q6H PRN Tarri Fuller, PA-C      . HYDROcodone-acetaminophen (NORCO) 10-325 MG per tablet 1 tablet  1 tablet Oral Q6H PRN Burnell Blanks, MD   1 tablet at 04/01/13 0410  . insulin aspart (novoLOG) injection 0-15 Units  0-15 Units Subcutaneous TID WC Perry Mount, PA-C   2 Units at 04/01/13 [phone]  . insulin aspart (novoLOG) injection 4 Units  4 Units  Subcutaneous TID WC Perry Mount, PA-C   4 Units at 03/31/13 1741  . latanoprost (XALATAN) 0.005 % ophthalmic solution 1 drop  1 drop Both Eyes QHS Burnell Blanks, MD   1 drop at 03/31/13 2201  . nitroGLYCERIN (NITROSTAT) SL tablet 0.4 mg  0.4 mg Sublingual Q5 min PRN Burnell Blanks, MD      . pantoprazole (PROTONIX) EC tablet 40 mg  40 mg Oral Daily Burnell Blanks, MD   40 mg at 03/31/13 1103  . polyethylene glycol (MIRALAX / GLYCOLAX) packet 17 g  17 g Oral Daily PRN Sueanne Margarita, MD      . predniSONE (DELTASONE) tablet 4 mg  4 mg Oral BH-q7a Burnell Blanks, MD   4 mg at 03/31/13 0708  . rosuvastatin (CRESTOR) tablet 20 mg  20 mg Oral q1800 Burnell Blanks, MD   20 mg at 03/31/13 1742  . sodium chloride 0.9 % injection 3 mL  3 mL Intravenous Q12H Burnell Blanks, MD   3 mL at 03/31/13 2204  . timolol (TIMOPTIC) 0.5 % ophthalmic solution 1 drop  1 drop Both Eyes Daily Burnell Blanks, MD   1 drop at 03/31/13 1104    Physical Exam: General  appearance: alert and no distress Lungs: clear to auscultation bilaterally Heart: regular rate and rhythm, S1, S2 normal and systolic murmur: early systolic 3/6, crescendo at 2nd right intercostal space Extremities: extremities normal, atraumatic, no cyanosis or edema and right groin hematoma without new drainage  Lab Results: Results for orders placed during the hospital encounter of 03/25/13 (from the past 48 hour(s))  GLUCOSE, CAPILLARY     Status: Abnormal   Collection Time    03/30/13 11:32 AM      Result Value Ref Range   Glucose-Capillary 173 (*) 70 - 99 mg/dL   Comment 1 Notify RN    GLUCOSE, CAPILLARY     Status: Abnormal   Collection Time    03/30/13  4:40 PM      Result Value Ref Range   Glucose-Capillary 235 (*) 70 - 99 mg/dL   Comment 1 Documented in Chart     Comment 2 Notify RN    GLUCOSE, CAPILLARY     Status: Abnormal   Collection Time    03/30/13  9:08 PM      Result Value  Ref Range   Glucose-Capillary 168 (*) 70 - 99 mg/dL   Comment 1 Documented in Chart     Comment 2 Notify RN    CBC     Status: Abnormal   Collection Time    03/31/13  5:29 AM      Result Value Ref Range   WBC 5.8  4.0 - 10.5 K/uL   RBC 3.29 (*) 3.87 - 5.11 MIL/uL   Hemoglobin 9.7 (*) 12.0 - 15.0 g/dL   HCT 28.9 (*) 36.0 - 46.0 %   MCV 87.8  78.0 - 100.0 fL   MCH 29.5  26.0 - 34.0 pg   MCHC 33.6  30.0 - 36.0 g/dL   RDW 14.6  11.5 - 15.5 %   Platelets 114 (*) 150 - 400 K/uL   Comment: CONSISTENT WITH PREVIOUS RESULT  BASIC METABOLIC PANEL     Status: Abnormal   Collection Time    03/31/13  5:29 AM      Result Value Ref Range   Sodium 139  137 - 147 mEq/L   Potassium 3.6 (*) 3.7 - 5.3 mEq/L   Chloride 101  96 - 112 mEq/L   CO2 26  19 - 32 mEq/L   Glucose, Bld 152 (*) 70 - 99 mg/dL   BUN 16  6 - 23 mg/dL   Creatinine, Ser 1.23 (*) 0.50 - 1.10 mg/dL   Calcium 8.8  8.4 - 10.5 mg/dL   GFR calc non Af Amer 41 (*) >90 mL/min   GFR calc Af Amer 47 (*) >90 mL/min   Comment: (NOTE)     The eGFR has been calculated using the CKD EPI equation.     This calculation has not been validated in all clinical situations.     eGFR's persistently <90 mL/min signify possible Chronic Kidney     Disease.  GLUCOSE, CAPILLARY     Status: Abnormal   Collection Time    03/31/13  6:12 AM      Result Value Ref Range   Glucose-Capillary 158 (*) 70 - 99 mg/dL  GLUCOSE, CAPILLARY     Status: Abnormal   Collection Time    03/31/13 11:19 AM      Result Value Ref Range   Glucose-Capillary 162 (*) 70 - 99 mg/dL   Comment 1 Notify RN     Comment 2 Documented in Chart  GLUCOSE, CAPILLARY     Status: Abnormal   Collection Time    03/31/13  4:41 PM      Result Value Ref Range   Glucose-Capillary 161 (*) 70 - 99 mg/dL   Comment 1 Documented in Chart     Comment 2 Notify RN    GLUCOSE, CAPILLARY     Status: Abnormal   Collection Time    03/31/13  9:30 PM      Result Value Ref Range    Glucose-Capillary 140 (*) 70 - 99 mg/dL   Comment 1 Documented in Chart     Comment 2 Notify RN    CBC     Status: Abnormal   Collection Time    04/01/13  3:00 AM      Result Value Ref Range   WBC 5.8  4.0 - 10.5 K/uL   RBC 3.27 (*) 3.87 - 5.11 MIL/uL   Hemoglobin 9.7 (*) 12.0 - 15.0 g/dL   HCT 28.8 (*) 36.0 - 46.0 %   MCV 88.1  78.0 - 100.0 fL   MCH 29.7  26.0 - 34.0 pg   MCHC 33.7  30.0 - 36.0 g/dL   RDW 14.5  11.5 - 15.5 %   Platelets 134 (*) 150 - 400 K/uL  BASIC METABOLIC PANEL     Status: Abnormal   Collection Time    04/01/13  3:00 AM      Result Value Ref Range   Sodium 143  137 - 147 mEq/L   Potassium 3.9  3.7 - 5.3 mEq/L   Chloride 102  96 - 112 mEq/L   CO2 27  19 - 32 mEq/L   Glucose, Bld 158 (*) 70 - 99 mg/dL   BUN 20  6 - 23 mg/dL   Creatinine, Ser 1.11 (*) 0.50 - 1.10 mg/dL   Calcium 8.8  8.4 - 10.5 mg/dL   GFR calc non Af Amer 46 (*) >90 mL/min   GFR calc Af Amer 54 (*) >90 mL/min   Comment: (NOTE)     The eGFR has been calculated using the CKD EPI equation.     This calculation has not been validated in all clinical situations.     eGFR's persistently <90 mL/min signify possible Chronic Kidney     Disease.  GLUCOSE, CAPILLARY     Status: Abnormal   Collection Time    04/01/13  6:02 AM      Result Value Ref Range   Glucose-Capillary 149 (*) 70 - 99 mg/dL    Imaging: No results found.  Assessment:  1. Active Problems: 2.   Unstable angina 3.   Unstable angina pectoris 4.   Thrombocytopenia 5.   Hematoma of groin 6.   Plan:  1. Discharge home today with home RN services for wound care and home PT. Follow-up with Dr. Ron Parker as an outpatient.  Time Spent Directly with Patient:  15 minutes  Length of Stay:  LOS: 7 days   Pixie Casino, MD, [facility] Attending Cardiologist [facility]  HILTY,Kenneth C 04/01/2013, 8:07 AM

## 2013-04-01 NOTE — Progress Notes (Signed)
Ed completed with pt and daughter. Voiced understanding and had many questions re DM and using insulin. Pt would benefit from more DM teaching in the outpt setting. Gave modified ex gl. Pt unable to do CRPII due to back issues. Stent card was found. [id number] [place], ACSM 11:24 AM 04/01/2013

## 2013-04-02 DIAGNOSIS — Z4801 Encounter for change or removal of surgical wound dressing: Secondary | ICD-10-CM | POA: Diagnosis not present

## 2013-04-02 DIAGNOSIS — IMO0002 Reserved for concepts with insufficient information to code with codable children: Secondary | ICD-10-CM | POA: Diagnosis not present

## 2013-04-02 DIAGNOSIS — I2510 Atherosclerotic heart disease of native coronary artery without angina pectoris: Secondary | ICD-10-CM | POA: Diagnosis not present

## 2013-04-02 DIAGNOSIS — E119 Type 2 diabetes mellitus without complications: Secondary | ICD-10-CM | POA: Diagnosis not present

## 2013-04-02 DIAGNOSIS — I10 Essential (primary) hypertension: Secondary | ICD-10-CM | POA: Diagnosis not present

## 2013-04-02 DIAGNOSIS — I200 Unstable angina: Secondary | ICD-10-CM | POA: Diagnosis not present

## 2013-04-05 ENCOUNTER — Telehealth: Payer: Self-pay

## 2013-04-05 ENCOUNTER — Telehealth: Payer: Self-pay | Admitting: Internal Medicine

## 2013-04-05 DIAGNOSIS — Z7902 Long term (current) use of antithrombotics/antiplatelets: Secondary | ICD-10-CM | POA: Diagnosis not present

## 2013-04-05 DIAGNOSIS — Z794 Long term (current) use of insulin: Secondary | ICD-10-CM | POA: Diagnosis not present

## 2013-04-05 DIAGNOSIS — Z833 Family history of diabetes mellitus: Secondary | ICD-10-CM | POA: Diagnosis not present

## 2013-04-05 DIAGNOSIS — I2510 Atherosclerotic heart disease of native coronary artery without angina pectoris: Secondary | ICD-10-CM | POA: Diagnosis not present

## 2013-04-05 DIAGNOSIS — L02419 Cutaneous abscess of limb, unspecified: Secondary | ICD-10-CM | POA: Diagnosis not present

## 2013-04-05 DIAGNOSIS — I10 Essential (primary) hypertension: Secondary | ICD-10-CM | POA: Diagnosis not present

## 2013-04-05 DIAGNOSIS — E119 Type 2 diabetes mellitus without complications: Secondary | ICD-10-CM | POA: Diagnosis not present

## 2013-04-05 DIAGNOSIS — Z8249 Family history of ischemic heart disease and other diseases of the circulatory system: Secondary | ICD-10-CM | POA: Diagnosis not present

## 2013-04-05 DIAGNOSIS — I200 Unstable angina: Secondary | ICD-10-CM | POA: Diagnosis not present

## 2013-04-05 DIAGNOSIS — G40909 Epilepsy, unspecified, not intractable, without status epilepticus: Secondary | ICD-10-CM | POA: Diagnosis not present

## 2013-04-05 DIAGNOSIS — IMO0002 Reserved for concepts with insufficient information to code with codable children: Secondary | ICD-10-CM | POA: Diagnosis not present

## 2013-04-05 DIAGNOSIS — Z8541 Personal history of malignant neoplasm of cervix uteri: Secondary | ICD-10-CM | POA: Diagnosis not present

## 2013-04-05 DIAGNOSIS — Z9889 Other specified postprocedural states: Secondary | ICD-10-CM | POA: Diagnosis not present

## 2013-04-05 DIAGNOSIS — Z79899 Other long term (current) drug therapy: Secondary | ICD-10-CM | POA: Diagnosis not present

## 2013-04-05 DIAGNOSIS — S7010XA Contusion of unspecified thigh, initial encounter: Secondary | ICD-10-CM | POA: Diagnosis not present

## 2013-04-05 DIAGNOSIS — Z4801 Encounter for change or removal of surgical wound dressing: Secondary | ICD-10-CM | POA: Diagnosis not present

## 2013-04-05 DIAGNOSIS — L03119 Cellulitis of unspecified part of limb: Secondary | ICD-10-CM | POA: Diagnosis not present

## 2013-04-05 DIAGNOSIS — M7989 Other specified soft tissue disorders: Secondary | ICD-10-CM | POA: Diagnosis not present

## 2013-04-05 NOTE — Telephone Encounter (Signed)
Phone call from pt's [place].  Reported pt. has increased swelling in the right groin since Saturday evening; 3/14.  Reported today, right groin swelling extends from lateral to medial groin area; stated area is firm and tender.  Discussed with Dr. Trula Slade.  Advised pt. should be evaluated in the ER, due to reported increased swelling since Saturday.  Phone call to pt.  Spoke with daughter, Juliann Pulse.  Advised to take pt. to the ER.  Advised pt. should remain NPO except sips of water, until it is determined if pt. will need to have surgery.  Daughter verb. Understanding.

## 2013-04-05 NOTE — Telephone Encounter (Signed)
[name] with [facility] called.  Visiting Sharon Hubbard as ordered by DR. Hilty.  Friday right groin site looked good was not swollen or hard.  Today the site is swollen and hard.  Per daughter this started Saturday PM.  She has been changing the dressing bid with bloody discharge visible on pad.  Instructed to contact Dr. Kellie Simmering who did the surgery on this site.  Voiced understanding.  Will send this info to Dr. Debara Pickett for any additional advise.

## 2013-04-05 NOTE — Telephone Encounter (Signed)
Should see vascular surgery regarding issues with her groin as they operated on her.  Thanks for letting me know.  -Dr. Debara Pickett

## 2013-04-07 DIAGNOSIS — Z4801 Encounter for change or removal of surgical wound dressing: Secondary | ICD-10-CM | POA: Diagnosis not present

## 2013-04-07 DIAGNOSIS — E119 Type 2 diabetes mellitus without complications: Secondary | ICD-10-CM | POA: Diagnosis not present

## 2013-04-07 DIAGNOSIS — IMO0002 Reserved for concepts with insufficient information to code with codable children: Secondary | ICD-10-CM | POA: Diagnosis not present

## 2013-04-07 DIAGNOSIS — I2510 Atherosclerotic heart disease of native coronary artery without angina pectoris: Secondary | ICD-10-CM | POA: Diagnosis not present

## 2013-04-07 DIAGNOSIS — I200 Unstable angina: Secondary | ICD-10-CM | POA: Diagnosis not present

## 2013-04-07 DIAGNOSIS — I10 Essential (primary) hypertension: Secondary | ICD-10-CM | POA: Diagnosis not present

## 2013-04-09 ENCOUNTER — Telehealth: Payer: Self-pay | Admitting: *Deleted

## 2013-04-09 DIAGNOSIS — I10 Essential (primary) hypertension: Secondary | ICD-10-CM | POA: Diagnosis not present

## 2013-04-09 DIAGNOSIS — I200 Unstable angina: Secondary | ICD-10-CM | POA: Diagnosis not present

## 2013-04-09 DIAGNOSIS — Z4801 Encounter for change or removal of surgical wound dressing: Secondary | ICD-10-CM | POA: Diagnosis not present

## 2013-04-09 DIAGNOSIS — I2510 Atherosclerotic heart disease of native coronary artery without angina pectoris: Secondary | ICD-10-CM | POA: Diagnosis not present

## 2013-04-09 DIAGNOSIS — IMO0002 Reserved for concepts with insufficient information to code with codable children: Secondary | ICD-10-CM | POA: Diagnosis not present

## 2013-04-09 DIAGNOSIS — E119 Type 2 diabetes mellitus without complications: Secondary | ICD-10-CM | POA: Diagnosis not present

## 2013-04-09 NOTE — Telephone Encounter (Signed)
[place] Confirmation of Plan of Care to [facility]

## 2013-04-12 ENCOUNTER — Telehealth: Payer: Self-pay | Admitting: *Deleted

## 2013-04-12 ENCOUNTER — Encounter: Payer: Self-pay | Admitting: Vascular Surgery

## 2013-04-12 DIAGNOSIS — I10 Essential (primary) hypertension: Secondary | ICD-10-CM | POA: Diagnosis not present

## 2013-04-12 DIAGNOSIS — IMO0002 Reserved for concepts with insufficient information to code with codable children: Secondary | ICD-10-CM | POA: Diagnosis not present

## 2013-04-12 DIAGNOSIS — I2510 Atherosclerotic heart disease of native coronary artery without angina pectoris: Secondary | ICD-10-CM | POA: Diagnosis not present

## 2013-04-12 DIAGNOSIS — E119 Type 2 diabetes mellitus without complications: Secondary | ICD-10-CM | POA: Diagnosis not present

## 2013-04-12 DIAGNOSIS — I200 Unstable angina: Secondary | ICD-10-CM | POA: Diagnosis not present

## 2013-04-12 DIAGNOSIS — Z4801 Encounter for change or removal of surgical wound dressing: Secondary | ICD-10-CM | POA: Diagnosis not present

## 2013-04-12 NOTE — Telephone Encounter (Signed)
Faxed sign order for walker, folding, youth w/wheels to [facility]

## 2013-04-13 ENCOUNTER — Encounter: Payer: Medicare Other | Admitting: Vascular Surgery

## 2013-04-13 ENCOUNTER — Encounter: Payer: Self-pay | Admitting: Vascular Surgery

## 2013-04-13 ENCOUNTER — Ambulatory Visit (INDEPENDENT_AMBULATORY_CARE_PROVIDER_SITE_OTHER): Payer: Self-pay | Admitting: Vascular Surgery

## 2013-04-13 VITALS — BP 189/80 | HR 53 | Resp 16 | Ht 63.5 in | Wt 142.7 lb

## 2013-04-13 DIAGNOSIS — I97410 Intraoperative hemorrhage and hematoma of a circulatory system organ or structure complicating a cardiac catheterization: Secondary | ICD-10-CM

## 2013-04-13 DIAGNOSIS — Z9889 Other specified postprocedural states: Secondary | ICD-10-CM | POA: Insufficient documentation

## 2013-04-13 NOTE — Progress Notes (Signed)
Subjective:     Patient ID: Sharon Hubbard, female   DOB: 21-Oct-1933, 80 y.o.   MRN: [id number]  HPI this 80 year old female returns 14 days post urgent right femoral artery repair following hemorrhage which occurred a few days after a cardiac catheterization with intervention. Patient had a clean puncture site in the right common femoral artery which had bled laterally and posteriorly and inferiorly into the thigh. She has had no chills and fever but does fill generally weak. He is beginning to ambulate better at home.  Review of Systems     Objective:   Physical Exam BP 189/80  Pulse 53  Resp 16  Ht 5' 3.5" (1.613 m)  Wt 142 lb 11.2 oz (64.728 kg)  BMI 24.88 kg/m2  General elderly female no apparent stress alert and oriented x3 Right inguinal region with well-healed incisions and skin staples in place-removed today There is resolving hematoma extending transversely over a 10 x 4 cm area with no fluctuance or drainage. The drain exit site is healing nicely. 3+ dorsalis pedis pulse palpable.     Assessment:     Well-healing right inguinal wound following urgent repair right common femoral artery and evacuation of large hematoma with drainage 14 days ago Resolving hematoma and ecchymosis right lower extremity    Plan:     Skin staples removed and patient will return to see [place] on when necessary basis

## 2013-04-14 DIAGNOSIS — I2510 Atherosclerotic heart disease of native coronary artery without angina pectoris: Secondary | ICD-10-CM | POA: Diagnosis not present

## 2013-04-14 DIAGNOSIS — Z4801 Encounter for change or removal of surgical wound dressing: Secondary | ICD-10-CM | POA: Diagnosis not present

## 2013-04-14 DIAGNOSIS — I200 Unstable angina: Secondary | ICD-10-CM | POA: Diagnosis not present

## 2013-04-14 DIAGNOSIS — E119 Type 2 diabetes mellitus without complications: Secondary | ICD-10-CM | POA: Diagnosis not present

## 2013-04-14 DIAGNOSIS — IMO0002 Reserved for concepts with insufficient information to code with codable children: Secondary | ICD-10-CM | POA: Diagnosis not present

## 2013-04-14 DIAGNOSIS — I10 Essential (primary) hypertension: Secondary | ICD-10-CM | POA: Diagnosis not present

## 2013-04-15 DIAGNOSIS — I2510 Atherosclerotic heart disease of native coronary artery without angina pectoris: Secondary | ICD-10-CM | POA: Diagnosis not present

## 2013-04-15 DIAGNOSIS — Z4801 Encounter for change or removal of surgical wound dressing: Secondary | ICD-10-CM | POA: Diagnosis not present

## 2013-04-15 DIAGNOSIS — E119 Type 2 diabetes mellitus without complications: Secondary | ICD-10-CM | POA: Diagnosis not present

## 2013-04-15 DIAGNOSIS — I10 Essential (primary) hypertension: Secondary | ICD-10-CM | POA: Diagnosis not present

## 2013-04-15 DIAGNOSIS — IMO0002 Reserved for concepts with insufficient information to code with codable children: Secondary | ICD-10-CM | POA: Diagnosis not present

## 2013-04-15 DIAGNOSIS — I200 Unstable angina: Secondary | ICD-10-CM | POA: Diagnosis not present

## 2013-04-17 DIAGNOSIS — I200 Unstable angina: Secondary | ICD-10-CM | POA: Diagnosis not present

## 2013-04-17 DIAGNOSIS — IMO0002 Reserved for concepts with insufficient information to code with codable children: Secondary | ICD-10-CM | POA: Diagnosis not present

## 2013-04-17 DIAGNOSIS — I2510 Atherosclerotic heart disease of native coronary artery without angina pectoris: Secondary | ICD-10-CM | POA: Diagnosis not present

## 2013-04-17 DIAGNOSIS — I10 Essential (primary) hypertension: Secondary | ICD-10-CM | POA: Diagnosis not present

## 2013-04-17 DIAGNOSIS — Z4801 Encounter for change or removal of surgical wound dressing: Secondary | ICD-10-CM | POA: Diagnosis not present

## 2013-04-17 DIAGNOSIS — E119 Type 2 diabetes mellitus without complications: Secondary | ICD-10-CM | POA: Diagnosis not present

## 2013-04-19 DIAGNOSIS — I200 Unstable angina: Secondary | ICD-10-CM | POA: Diagnosis not present

## 2013-04-19 DIAGNOSIS — I2510 Atherosclerotic heart disease of native coronary artery without angina pectoris: Secondary | ICD-10-CM | POA: Diagnosis not present

## 2013-04-19 DIAGNOSIS — Z4801 Encounter for change or removal of surgical wound dressing: Secondary | ICD-10-CM | POA: Diagnosis not present

## 2013-04-19 DIAGNOSIS — IMO0002 Reserved for concepts with insufficient information to code with codable children: Secondary | ICD-10-CM | POA: Diagnosis not present

## 2013-04-19 DIAGNOSIS — I10 Essential (primary) hypertension: Secondary | ICD-10-CM | POA: Diagnosis not present

## 2013-04-19 DIAGNOSIS — E119 Type 2 diabetes mellitus without complications: Secondary | ICD-10-CM | POA: Diagnosis not present

## 2013-04-20 DIAGNOSIS — R58 Hemorrhage, not elsewhere classified: Secondary | ICD-10-CM | POA: Diagnosis not present

## 2013-04-20 DIAGNOSIS — I10 Essential (primary) hypertension: Secondary | ICD-10-CM | POA: Diagnosis not present

## 2013-04-20 DIAGNOSIS — D75829 Heparin-induced thrombocytopenia, unspecified: Secondary | ICD-10-CM | POA: Diagnosis not present

## 2013-04-20 DIAGNOSIS — IMO0001 Reserved for inherently not codable concepts without codable children: Secondary | ICD-10-CM | POA: Diagnosis not present

## 2013-04-20 DIAGNOSIS — D649 Anemia, unspecified: Secondary | ICD-10-CM | POA: Diagnosis not present

## 2013-04-20 DIAGNOSIS — D7582 Heparin induced thrombocytopenia (HIT): Secondary | ICD-10-CM | POA: Diagnosis not present

## 2013-04-21 DIAGNOSIS — Z4801 Encounter for change or removal of surgical wound dressing: Secondary | ICD-10-CM | POA: Diagnosis not present

## 2013-04-21 DIAGNOSIS — E119 Type 2 diabetes mellitus without complications: Secondary | ICD-10-CM | POA: Diagnosis not present

## 2013-04-21 DIAGNOSIS — IMO0002 Reserved for concepts with insufficient information to code with codable children: Secondary | ICD-10-CM | POA: Diagnosis not present

## 2013-04-21 DIAGNOSIS — I10 Essential (primary) hypertension: Secondary | ICD-10-CM | POA: Diagnosis not present

## 2013-04-21 DIAGNOSIS — I200 Unstable angina: Secondary | ICD-10-CM | POA: Diagnosis not present

## 2013-04-21 DIAGNOSIS — I2510 Atherosclerotic heart disease of native coronary artery without angina pectoris: Secondary | ICD-10-CM | POA: Diagnosis not present

## 2013-04-22 DIAGNOSIS — Z4801 Encounter for change or removal of surgical wound dressing: Secondary | ICD-10-CM | POA: Diagnosis not present

## 2013-04-22 DIAGNOSIS — E119 Type 2 diabetes mellitus without complications: Secondary | ICD-10-CM | POA: Diagnosis not present

## 2013-04-22 DIAGNOSIS — I2510 Atherosclerotic heart disease of native coronary artery without angina pectoris: Secondary | ICD-10-CM | POA: Diagnosis not present

## 2013-04-22 DIAGNOSIS — I200 Unstable angina: Secondary | ICD-10-CM | POA: Diagnosis not present

## 2013-04-22 DIAGNOSIS — IMO0002 Reserved for concepts with insufficient information to code with codable children: Secondary | ICD-10-CM | POA: Diagnosis not present

## 2013-04-22 DIAGNOSIS — I10 Essential (primary) hypertension: Secondary | ICD-10-CM | POA: Diagnosis not present

## 2013-04-23 DIAGNOSIS — I200 Unstable angina: Secondary | ICD-10-CM | POA: Diagnosis not present

## 2013-04-23 DIAGNOSIS — I10 Essential (primary) hypertension: Secondary | ICD-10-CM | POA: Diagnosis not present

## 2013-04-23 DIAGNOSIS — I2510 Atherosclerotic heart disease of native coronary artery without angina pectoris: Secondary | ICD-10-CM | POA: Diagnosis not present

## 2013-04-23 DIAGNOSIS — IMO0002 Reserved for concepts with insufficient information to code with codable children: Secondary | ICD-10-CM | POA: Diagnosis not present

## 2013-04-23 DIAGNOSIS — E119 Type 2 diabetes mellitus without complications: Secondary | ICD-10-CM | POA: Diagnosis not present

## 2013-04-23 DIAGNOSIS — Z4801 Encounter for change or removal of surgical wound dressing: Secondary | ICD-10-CM | POA: Diagnosis not present

## 2013-04-26 ENCOUNTER — Telehealth: Payer: Self-pay | Admitting: *Deleted

## 2013-04-26 ENCOUNTER — Encounter: Payer: Self-pay | Admitting: Cardiology

## 2013-04-26 NOTE — Telephone Encounter (Signed)
Returning [place] call from 04-23-2013 ([facility] office closed on 04-23-2013) on behalf of Hazel Leveille.  Corinda Gubler RN states she talked with Dr.Early on 04-23-2013 after she had seen Evette Doffing on a home visit.  She states that the "scab on Mrs. Kronick's right inguinal wound had come off and the edges around it were slightly reddened."  Ms. Aline Brochure states that the inguinal wound was "0.6 cm x  0.4 cm with some slough tissue."  She states that Mrs. Rigsby had walked lots on 04-22-2013.  Corinda Gubler RN denies swelling, pain, odor from Mrs. Mortell's right inguinal wound and denies fever/chills. Corinda Gubler RN reported these findings to Curt Jews MD on 04-23-2013 who states these findings were within normal limits and stated that Mrs. Wamser could be discharged from home health care and that Mrs. Quirion needed to put a dry gauze dressing on daily. Corinda Gubler stated that she will enter these orders from Dr. Donnetta Hutching today.

## 2013-04-27 ENCOUNTER — Encounter: Payer: Self-pay | Admitting: Cardiology

## 2013-04-27 ENCOUNTER — Ambulatory Visit (INDEPENDENT_AMBULATORY_CARE_PROVIDER_SITE_OTHER): Payer: Medicare Other | Admitting: Cardiology

## 2013-04-27 ENCOUNTER — Encounter: Payer: Self-pay | Admitting: Vascular Surgery

## 2013-04-27 ENCOUNTER — Ambulatory Visit (INDEPENDENT_AMBULATORY_CARE_PROVIDER_SITE_OTHER): Payer: Self-pay | Admitting: Vascular Surgery

## 2013-04-27 VITALS — BP 186/68 | HR 98 | Resp 16 | Ht 63.5 in | Wt 142.0 lb

## 2013-04-27 VITALS — BP 142/60 | HR 64 | Ht 63.5 in | Wt 142.0 lb

## 2013-04-27 DIAGNOSIS — I2510 Atherosclerotic heart disease of native coronary artery without angina pectoris: Secondary | ICD-10-CM | POA: Diagnosis not present

## 2013-04-27 DIAGNOSIS — I200 Unstable angina: Secondary | ICD-10-CM

## 2013-04-27 DIAGNOSIS — Z9889 Other specified postprocedural states: Secondary | ICD-10-CM | POA: Diagnosis not present

## 2013-04-27 NOTE — Assessment & Plan Note (Signed)
The patient's wound in the right groin has opened. I called and spoke with Dr. Donnetta Hutching. He is willing to see the patient this afternoon and we have mutually directed the patient and her companion to their office.

## 2013-04-27 NOTE — Progress Notes (Signed)
Seen today as an add-on. He was seen in Dr. Ron Parker office and he called regarding concerns regarding her right groin. She is status post undergo exploration by Dr. Kellie Simmering several weeks ago for very large hematoma status post cardiac catheterization. She was hypotensive and had 4 unit blood transfusion. Using the operating room for repair of the artery. She did have some difficulty with healing and had a drain in place and wasn't quite deconditioned after her hospitalization.  On examination today she looks quite good. She does have a 2+ popliteal pulse. Her groin has one area approximately 1 cm in diameter with skin necrosis and fat necrosis underlying this. There also several Vicryl sutures under this. I debrided this in the office today and instructed the patient and her daughter on local care. We will also have home health a check on her as well. I do not feel to be any difficulty with the ongoing healing of this. She will see Dr. Kellie Simmering again in 2-3 weeks for final followup

## 2013-04-27 NOTE — Progress Notes (Signed)
Patient ID: Sharon Hubbard, female   DOB: 11-08-1933, 80 y.o.   MRN: [id number]    HPI  Patient is seen today to followup coronary disease. I saw her last March 23, 2013. She was having marked exertional shortness of breath. Her echo showed good LV function. We were concerned that she was having ischemia area therefore catheterization was arranged. This was done and she had a significant lesion that was stented. Unfortunately the following day she had a significant groin bleed with hematoma that required surgery. This was done successfully. She has been followed up as an outpatient by vascular surgery. Today she is mentioned that she's worried that her wound has opened.  Patient says she's also having some fatigue.  Allergies  Allergen Reactions  . Clarithromycin     REACTION: Vomiting and diarrhea.  . Codeine   . Sulfa Antibiotics   . Atorvastatin Other (See Comments)    Made her knees buckle and muscles ache    Current Outpatient Prescriptions  Medication Sig Dispense Refill  . amLODipine (NORVASC) 10 MG tablet Take 10 mg by mouth daily.        Marland Kitchen aspirin 81 MG tablet Take 81 mg by mouth daily.        . cholecalciferol (VITAMIN D) 1000 UNITS tablet Take 1,000 Units by mouth daily.      . clopidogrel (PLAVIX) 75 MG tablet TAKE ONE TABLET (75MG ) BY MOUTH DAILY  90 tablet  3  . divalproex (DEPAKOTE) 500 MG 24 hr tablet Take 500 mg by mouth daily.        . furosemide (LASIX) 40 MG tablet Take 1 tablet (40 mg total) by mouth daily.  90 tablet  0  . glimepiride (AMARYL) 4 MG tablet Take 4 mg by mouth 2 (two) times daily.        Marland Kitchen HYDROcodone-acetaminophen (NORCO) 10-325 MG per tablet Take 1 tablet by mouth every 6 (six) hours as needed for pain.      Marland Kitchen insulin lispro (HUMALOG) 100 UNIT/ML injection Inject 0-10 Units into the skin 3 (three) times daily before meals. Pt is on a sliding scale      . latanoprost (XALATAN) 0.005 % ophthalmic solution Place 1 drop into both eyes at bedtime.         . nitroGLYCERIN (NITROSTAT) 0.4 MG SL tablet Place 0.4 mg under the tongue every 5 (five) minutes as needed for chest pain.      . pantoprazole (PROTONIX) 40 MG tablet Take 40 mg by mouth daily.        . predniSONE (DELTASONE) 1 MG tablet Take 4 mg by mouth every morning.       . rosuvastatin (CRESTOR) 20 MG tablet Take 20 mg by mouth at bedtime.      . timolol (TIMOPTIC) 0.5 % ophthalmic solution Place 1 drop into both eyes daily.        No current facility-administered medications for this visit.    History   Social History  . Marital Status: Married    Spouse Name: N/A    Number of Children: N/A  . Years of Education: N/A   Occupational History  . Not on file.   Social History Main Topics  . Smoking status: Never Smoker   . Smokeless tobacco: Never Used  . Alcohol Use: No  . Drug Use: No  . Sexual Activity: No   Other Topics Concern  . Not on file   Social History Narrative  . No narrative on  file    History reviewed. No pertinent family history.  Past Medical History  Diagnosis Date  . CAD (coronary artery disease)     Interventions in the past  . Groin hematoma     April, 2011  . Hypertension   . Diabetes mellitus   . Dyslipidemia   . GERD (gastroesophageal reflux disease)   . Schatzki's ring   . Renal insufficiency   . Polymyalgia rheumatica   . Osteoarthritis     arthroscopic surgery right knee February, 2012  . Spinal stenosis     history of surgery for this  . Ejection fraction     EF 65%, echo, April, 2012, aortic valve sclerosis with very slight gradient  . Preoperative evaluation to rule out surgical contraindication     Patient needs back surgery by Dr. Carloyn Manner, May, 2012                . Pancreas cyst     The patient has multiple pancreatic cysts.  This is followed at [facility]         . Complication of anesthesia     "felt like I was smothering once with mask on my face"  . RBBB (right bundle branch block)     old  . Bradycardia, sinus   .  Myocardial infarction 2011; 08/2010  . Shortness of breath on exertion   . Blood transfusion   . Anemia   . Stroke 01/18/11    "I've had 2 TIA's"  . Cancer     ""had hysterectomy for a 6 showing cancer; think that's pretty strong  . Aortic stenosis     very mild, echo 12/2010  . Femoral bruit     01/2011 hosp, but no pseudoaneurysm or AV fistula    Past Surgical History  Procedure Laterality Date  . Cholecystectomy  1987  . Appendectomy    . Back surgery    . Lumbar spine surgery  ~ 1977; ~ 2010    "2 hugh ruptured discs; I was paralyzed first OR; 2nd OR by Dr. Carloyn Manner, don't know what for"  . Tubal ligation  1970's  . Dilation and curettage of uterus    . Abdominal hysterectomy  ~ 1974  . Knee arthroscopy  02/2010    right knee; chrondoplasty medial femoral condyle;  Partial medial meniscectomy.   . Coronary angioplasty with stent placement  2011    "in [place]"  . Coronary angioplasty with stent placement  08/2010; 03/25/2013  . Cardiac catheterization  01/18/11  . Femoral artery exploration Right 03/26/2013    Procedure: FEMORAL ARTERY EXPLORATION WITH SUTURE REPAIR; EVACUATION OF HEMATOMA;  Surgeon: Mal Misty, MD;  Location: [facility];  Service: Vascular;  Laterality: Right;    Patient Active Problem List   Diagnosis Date Noted  . Femoral bruit     Priority: High  . S/P evacuation of hematoma 04/13/2013  . Thrombocytopenia 03/29/2013  . Shortness of breath on exertion   . Aortic stenosis   . Preoperative evaluation to rule out surgical contraindication   . Pancreas cyst   . CAD (coronary artery disease)   . RBBB (right bundle branch block)   . Groin hematoma   . Hypertension   . Diabetes mellitus   . Dyslipidemia   . GERD (gastroesophageal reflux disease)   . Renal insufficiency   . Polymyalgia rheumatica   . Bradycardia   . Ejection fraction     ROS   Patient denies fever, chills, headache, sweats,  rash, change in vision, change in hearing, chest pain, cough,  nausea vomiting, urinary symptoms. All other systems are reviewed and are negative.  PHYSICAL EXAM  Patient is oriented to person time and place. Affect is normal. Lungs reveal a few scattered rhonchi. Cardiac exam reveals S1 and S2. There no clicks or significant murmurs. The patient's right groin surgical site has an area that has opened.  Filed Vitals:   04/27/13 1601  BP: 142/60  Pulse: 64  Height: 5' 3.5" (1.613 m)  Weight: 142 lb (64.411 kg)     ASSESSMENT & PLAN

## 2013-04-27 NOTE — Assessment & Plan Note (Signed)
The patient's coronary status appears to be stable at this point.

## 2013-04-28 ENCOUNTER — Telehealth: Payer: Self-pay

## 2013-04-28 ENCOUNTER — Telehealth: Payer: Self-pay | Admitting: *Deleted

## 2013-04-28 DIAGNOSIS — I10 Essential (primary) hypertension: Secondary | ICD-10-CM | POA: Diagnosis not present

## 2013-04-28 DIAGNOSIS — E119 Type 2 diabetes mellitus without complications: Secondary | ICD-10-CM | POA: Diagnosis not present

## 2013-04-28 DIAGNOSIS — Z4801 Encounter for change or removal of surgical wound dressing: Secondary | ICD-10-CM | POA: Diagnosis not present

## 2013-04-28 DIAGNOSIS — I2510 Atherosclerotic heart disease of native coronary artery without angina pectoris: Secondary | ICD-10-CM | POA: Diagnosis not present

## 2013-04-28 DIAGNOSIS — I200 Unstable angina: Secondary | ICD-10-CM | POA: Diagnosis not present

## 2013-04-28 DIAGNOSIS — IMO0002 Reserved for concepts with insufficient information to code with codable children: Secondary | ICD-10-CM | POA: Diagnosis not present

## 2013-04-28 NOTE — Telephone Encounter (Signed)
Faxed home health care certification and plan of care to [facility].

## 2013-04-28 NOTE — Telephone Encounter (Signed)
Sherron Monday RN at [facility] to inform her of new order for wound care to right groin wound  Per Dr Sherren Mocha Early. The wound is to be packed daily with wet to dry saline 2 x 2.

## 2013-04-28 NOTE — Telephone Encounter (Signed)
Per Dr Ron Parker this pt needs an appointment to see him in about 2 to 3 weeks as she could not stay for OV yesterday.  I spoke with the pts daughter in law who states that the pt and her daughter are out right now and she states that she will advise the pt to call me back.

## 2013-04-29 ENCOUNTER — Telehealth: Payer: Self-pay | Admitting: Vascular Surgery

## 2013-04-29 NOTE — Telephone Encounter (Addendum)
Message copied by Gena Fray on Thu Apr 29, 2013  1:22 PM ------      Message from: Denman George      Created: Thu Apr 29, 2013 11:46 AM      Regarding: requesting an afternoon appt      Contact: [phone]       This pt's daughter wanted to get the 10:30 AM appt. on 5/5 rescheduled to an afternoon appt. If possible.    ------  04/29/13: spoke with pts daugther Juliann Pulse- ended up keeping 05/25/13 at 10:30. I explained that JDL is out of the office 2 weeks prior, and the next afternoon in 05/12. She opted to keep appt on 05/05 at 10:30 for now, dpm

## 2013-04-30 DIAGNOSIS — I2510 Atherosclerotic heart disease of native coronary artery without angina pectoris: Secondary | ICD-10-CM | POA: Diagnosis not present

## 2013-04-30 DIAGNOSIS — E119 Type 2 diabetes mellitus without complications: Secondary | ICD-10-CM | POA: Diagnosis not present

## 2013-04-30 DIAGNOSIS — I200 Unstable angina: Secondary | ICD-10-CM | POA: Diagnosis not present

## 2013-04-30 DIAGNOSIS — I10 Essential (primary) hypertension: Secondary | ICD-10-CM | POA: Diagnosis not present

## 2013-04-30 DIAGNOSIS — IMO0002 Reserved for concepts with insufficient information to code with codable children: Secondary | ICD-10-CM | POA: Diagnosis not present

## 2013-04-30 DIAGNOSIS — Z4801 Encounter for change or removal of surgical wound dressing: Secondary | ICD-10-CM | POA: Diagnosis not present

## 2013-04-30 NOTE — Telephone Encounter (Signed)
**Note De-Identified  Obfuscation** The pt is scheduled to see Dr Ron Parker on 4/28, the pt is aware.

## 2013-05-03 DIAGNOSIS — IMO0002 Reserved for concepts with insufficient information to code with codable children: Secondary | ICD-10-CM | POA: Diagnosis not present

## 2013-05-03 DIAGNOSIS — Z4801 Encounter for change or removal of surgical wound dressing: Secondary | ICD-10-CM | POA: Diagnosis not present

## 2013-05-03 DIAGNOSIS — I2510 Atherosclerotic heart disease of native coronary artery without angina pectoris: Secondary | ICD-10-CM | POA: Diagnosis not present

## 2013-05-03 DIAGNOSIS — E119 Type 2 diabetes mellitus without complications: Secondary | ICD-10-CM | POA: Diagnosis not present

## 2013-05-03 DIAGNOSIS — I10 Essential (primary) hypertension: Secondary | ICD-10-CM | POA: Diagnosis not present

## 2013-05-03 DIAGNOSIS — I200 Unstable angina: Secondary | ICD-10-CM | POA: Diagnosis not present

## 2013-05-05 DIAGNOSIS — I2510 Atherosclerotic heart disease of native coronary artery without angina pectoris: Secondary | ICD-10-CM | POA: Diagnosis not present

## 2013-05-05 DIAGNOSIS — E119 Type 2 diabetes mellitus without complications: Secondary | ICD-10-CM | POA: Diagnosis not present

## 2013-05-05 DIAGNOSIS — I200 Unstable angina: Secondary | ICD-10-CM | POA: Diagnosis not present

## 2013-05-05 DIAGNOSIS — I10 Essential (primary) hypertension: Secondary | ICD-10-CM | POA: Diagnosis not present

## 2013-05-05 DIAGNOSIS — Z4801 Encounter for change or removal of surgical wound dressing: Secondary | ICD-10-CM | POA: Diagnosis not present

## 2013-05-05 DIAGNOSIS — IMO0002 Reserved for concepts with insufficient information to code with codable children: Secondary | ICD-10-CM | POA: Diagnosis not present

## 2013-05-07 DIAGNOSIS — I10 Essential (primary) hypertension: Secondary | ICD-10-CM | POA: Diagnosis not present

## 2013-05-07 DIAGNOSIS — I2510 Atherosclerotic heart disease of native coronary artery without angina pectoris: Secondary | ICD-10-CM | POA: Diagnosis not present

## 2013-05-07 DIAGNOSIS — IMO0002 Reserved for concepts with insufficient information to code with codable children: Secondary | ICD-10-CM | POA: Diagnosis not present

## 2013-05-07 DIAGNOSIS — I200 Unstable angina: Secondary | ICD-10-CM | POA: Diagnosis not present

## 2013-05-07 DIAGNOSIS — E119 Type 2 diabetes mellitus without complications: Secondary | ICD-10-CM | POA: Diagnosis not present

## 2013-05-07 DIAGNOSIS — Z4801 Encounter for change or removal of surgical wound dressing: Secondary | ICD-10-CM | POA: Diagnosis not present

## 2013-05-10 DIAGNOSIS — Z4801 Encounter for change or removal of surgical wound dressing: Secondary | ICD-10-CM | POA: Diagnosis not present

## 2013-05-10 DIAGNOSIS — I2510 Atherosclerotic heart disease of native coronary artery without angina pectoris: Secondary | ICD-10-CM | POA: Diagnosis not present

## 2013-05-10 DIAGNOSIS — I200 Unstable angina: Secondary | ICD-10-CM | POA: Diagnosis not present

## 2013-05-10 DIAGNOSIS — IMO0002 Reserved for concepts with insufficient information to code with codable children: Secondary | ICD-10-CM | POA: Diagnosis not present

## 2013-05-10 DIAGNOSIS — I10 Essential (primary) hypertension: Secondary | ICD-10-CM | POA: Diagnosis not present

## 2013-05-10 DIAGNOSIS — E119 Type 2 diabetes mellitus without complications: Secondary | ICD-10-CM | POA: Diagnosis not present

## 2013-05-12 DIAGNOSIS — I200 Unstable angina: Secondary | ICD-10-CM | POA: Diagnosis not present

## 2013-05-12 DIAGNOSIS — I2510 Atherosclerotic heart disease of native coronary artery without angina pectoris: Secondary | ICD-10-CM | POA: Diagnosis not present

## 2013-05-12 DIAGNOSIS — IMO0002 Reserved for concepts with insufficient information to code with codable children: Secondary | ICD-10-CM | POA: Diagnosis not present

## 2013-05-12 DIAGNOSIS — Z4801 Encounter for change or removal of surgical wound dressing: Secondary | ICD-10-CM | POA: Diagnosis not present

## 2013-05-12 DIAGNOSIS — E119 Type 2 diabetes mellitus without complications: Secondary | ICD-10-CM | POA: Diagnosis not present

## 2013-05-12 DIAGNOSIS — I10 Essential (primary) hypertension: Secondary | ICD-10-CM | POA: Diagnosis not present

## 2013-05-14 DIAGNOSIS — I2510 Atherosclerotic heart disease of native coronary artery without angina pectoris: Secondary | ICD-10-CM | POA: Diagnosis not present

## 2013-05-14 DIAGNOSIS — I200 Unstable angina: Secondary | ICD-10-CM | POA: Diagnosis not present

## 2013-05-14 DIAGNOSIS — IMO0002 Reserved for concepts with insufficient information to code with codable children: Secondary | ICD-10-CM | POA: Diagnosis not present

## 2013-05-14 DIAGNOSIS — Z4801 Encounter for change or removal of surgical wound dressing: Secondary | ICD-10-CM | POA: Diagnosis not present

## 2013-05-14 DIAGNOSIS — E119 Type 2 diabetes mellitus without complications: Secondary | ICD-10-CM | POA: Diagnosis not present

## 2013-05-14 DIAGNOSIS — I10 Essential (primary) hypertension: Secondary | ICD-10-CM | POA: Diagnosis not present

## 2013-05-17 DIAGNOSIS — I200 Unstable angina: Secondary | ICD-10-CM | POA: Diagnosis not present

## 2013-05-17 DIAGNOSIS — I2510 Atherosclerotic heart disease of native coronary artery without angina pectoris: Secondary | ICD-10-CM | POA: Diagnosis not present

## 2013-05-17 DIAGNOSIS — I10 Essential (primary) hypertension: Secondary | ICD-10-CM | POA: Diagnosis not present

## 2013-05-17 DIAGNOSIS — Z4801 Encounter for change or removal of surgical wound dressing: Secondary | ICD-10-CM | POA: Diagnosis not present

## 2013-05-17 DIAGNOSIS — IMO0002 Reserved for concepts with insufficient information to code with codable children: Secondary | ICD-10-CM | POA: Diagnosis not present

## 2013-05-17 DIAGNOSIS — E119 Type 2 diabetes mellitus without complications: Secondary | ICD-10-CM | POA: Diagnosis not present

## 2013-05-18 ENCOUNTER — Ambulatory Visit (INDEPENDENT_AMBULATORY_CARE_PROVIDER_SITE_OTHER): Payer: Medicare Other | Admitting: Cardiology

## 2013-05-18 ENCOUNTER — Encounter: Payer: Self-pay | Admitting: Cardiology

## 2013-05-18 ENCOUNTER — Telehealth: Payer: Self-pay

## 2013-05-18 VITALS — BP 154/72 | HR 62 | Ht 64.0 in | Wt 141.6 lb

## 2013-05-18 DIAGNOSIS — S301XXA Contusion of abdominal wall, initial encounter: Secondary | ICD-10-CM | POA: Diagnosis not present

## 2013-05-18 DIAGNOSIS — I2510 Atherosclerotic heart disease of native coronary artery without angina pectoris: Secondary | ICD-10-CM

## 2013-05-18 DIAGNOSIS — I359 Nonrheumatic aortic valve disorder, unspecified: Secondary | ICD-10-CM

## 2013-05-18 DIAGNOSIS — I350 Nonrheumatic aortic (valve) stenosis: Secondary | ICD-10-CM

## 2013-05-18 DIAGNOSIS — I200 Unstable angina: Secondary | ICD-10-CM | POA: Diagnosis not present

## 2013-05-18 DIAGNOSIS — I10 Essential (primary) hypertension: Secondary | ICD-10-CM | POA: Diagnosis not present

## 2013-05-18 NOTE — Assessment & Plan Note (Signed)
The patient is status post DES to the LAD March, 2015. There is a small caliber diagonal was severe stenosis that was not treated. He was too small. She has significant groin hematoma that required surgery on March 26, 2013. The lesion opened and is being packed and is improving over time. Her coronary disease is stable. No change in therapy.

## 2013-05-18 NOTE — Assessment & Plan Note (Signed)
Blood pressures controlled. No change in therapy. 

## 2013-05-18 NOTE — Patient Instructions (Signed)
**Note De-Identified  Obfuscation** Your physician recommends that you continue on your current medications as directed. Please refer to the Current Medication list given to you today.  Your physician recommends that you schedule a follow-up appointment in: 10 weeks

## 2013-05-18 NOTE — Assessment & Plan Note (Signed)
The surgical wound after her groin hematoma evacuation continues to improve and she will followup with the surgical team.

## 2013-05-18 NOTE — Progress Notes (Signed)
Patient ID: Sharon Hubbard, female   DOB: 1933-09-29, 80 y.o.   MRN: [id number]    HPI  Patient is seen today to followup coronary disease. I saw her last in the office on April 27, 2013. She was returning after her coronary intervention and her followup with vascular surgery for evacuation of a hematoma.. In the office her groin lesion had opened. We sent her directly to see Dr. Donnetta Hutching, who is very nice and kept his  office opened. He worked with her lesion and she is doing much better. She is here today for further cardiology followup and then she will return for further vascular surgery followup. She is feeling much better. Her daughter is concerned that her activity level is not back to normal yet. However overall she sounds stable. There is also question that she may have some decreased memory.   Allergies  Allergen Reactions  . Clarithromycin     REACTION: Vomiting and diarrhea.  . Codeine   . Sulfa Antibiotics   . Atorvastatin Other (See Comments)    Made her knees buckle and muscles ache    Current Outpatient Prescriptions  Medication Sig Dispense Refill  . amLODipine (NORVASC) 10 MG tablet Take 10 mg by mouth daily.        Marland Kitchen aspirin 81 MG tablet Take 81 mg by mouth daily.        . cholecalciferol (VITAMIN D) 1000 UNITS tablet Take 1,000 Units by mouth daily.      . clopidogrel (PLAVIX) 75 MG tablet TAKE ONE TABLET (75MG ) BY MOUTH DAILY  90 tablet  3  . divalproex (DEPAKOTE) 500 MG 24 hr tablet Take 500 mg by mouth daily.        . furosemide (LASIX) 40 MG tablet Take 1 tablet (40 mg total) by mouth daily.  90 tablet  0  . glimepiride (AMARYL) 4 MG tablet Take 4 mg by mouth 2 (two) times daily.        Marland Kitchen HYDROcodone-acetaminophen (NORCO) 10-325 MG per tablet Take 1 tablet by mouth every 6 (six) hours as needed for pain.      Marland Kitchen insulin lispro (HUMALOG) 100 UNIT/ML injection Inject 0-10 Units into the skin 3 (three) times daily before meals. Pt is on a sliding scale      .  latanoprost (XALATAN) 0.005 % ophthalmic solution Place 1 drop into both eyes at bedtime.        . nitroGLYCERIN (NITROSTAT) 0.4 MG SL tablet Place 0.4 mg under the tongue every 5 (five) minutes as needed for chest pain.      . pantoprazole (PROTONIX) 40 MG tablet Take 40 mg by mouth daily.        . predniSONE (DELTASONE) 1 MG tablet Take 4 mg by mouth every morning.       . rosuvastatin (CRESTOR) 20 MG tablet Take 20 mg by mouth at bedtime.      . timolol (TIMOPTIC) 0.5 % ophthalmic solution Place 1 drop into both eyes daily.        No current facility-administered medications for this visit.    History   Social History  . Marital Status: Married    Spouse Name: N/A    Number of Children: N/A  . Years of Education: N/A   Occupational History  . Not on file.   Social History Main Topics  . Smoking status: Never Smoker   . Smokeless tobacco: Never Used  . Alcohol Use: No  . Drug Use: No  .  Sexual Activity: No   Other Topics Concern  . Not on file   Social History Narrative  . No narrative on file    History reviewed. No pertinent family history.  Past Medical History  Diagnosis Date  . CAD (coronary artery disease)     Interventions in the past  . Groin hematoma     April, 2011  . Hypertension   . Diabetes mellitus   . Dyslipidemia   . GERD (gastroesophageal reflux disease)   . Schatzki's ring   . Renal insufficiency   . Polymyalgia rheumatica   . Osteoarthritis     arthroscopic surgery right knee February, 2012  . Spinal stenosis     history of surgery for this  . Ejection fraction     EF 65%, echo, April, 2012, aortic valve sclerosis with very slight gradient  . Preoperative evaluation to rule out surgical contraindication     Patient needs back surgery by Dr. Carloyn Manner, May, 2012                . Pancreas cyst     The patient has multiple pancreatic cysts.  This is [facility] at [facility]         . Complication of anesthesia     "felt like I was smothering once  with mask on my face"  . RBBB (right bundle branch block)     old  . Bradycardia, sinus   . Myocardial infarction 2011; 08/2010  . Shortness of breath on exertion   . Blood transfusion   . Anemia   . Stroke 01/18/11    "I've had 2 TIA's"  . Cancer     ""had hysterectomy for a 6 showing cancer; think that's pretty strong  . Aortic stenosis     very mild, echo 12/2010  . Femoral bruit     01/2011 hosp, but no pseudoaneurysm or AV fistula    Past Surgical History  Procedure Laterality Date  . Cholecystectomy  1987  . Appendectomy    . Back surgery    . Lumbar spine surgery  ~ 1977; ~ 2010    "2 hugh ruptured discs; I was paralyzed first OR; 2nd OR by Dr. Carloyn Manner, don't know what for"  . Tubal ligation  1970's  . Dilation and curettage of uterus    . Abdominal hysterectomy  ~ 1974  . Knee arthroscopy  02/2010    right knee; chrondoplasty medial femoral condyle;  Partial medial meniscectomy.   . Coronary angioplasty with stent placement  2011    "in [place]"  . Coronary angioplasty with stent placement  08/2010; 03/25/2013  . Cardiac catheterization  01/18/11  . Femoral artery exploration Right 03/26/2013    Procedure: FEMORAL ARTERY EXPLORATION WITH SUTURE REPAIR; EVACUATION OF HEMATOMA;  Surgeon: Mal Misty, MD;  Location: [facility];  Service: Vascular;  Laterality: Right;    Patient Active Problem List   Diagnosis Date Noted  . Femoral bruit     Priority: High  . S/P evacuation of hematoma 04/13/2013  . Thrombocytopenia 03/29/2013  . Shortness of breath on exertion   . Aortic stenosis   . Preoperative evaluation to rule out surgical contraindication   . Pancreas cyst   . CAD (coronary artery disease)   . RBBB (right bundle branch block)   . Groin hematoma   . Hypertension   . Diabetes mellitus   . Dyslipidemia   . GERD (gastroesophageal reflux disease)   . Renal insufficiency   . Polymyalgia  rheumatica   . Bradycardia   . Ejection fraction     ROS   Patient denies  fever, chills, headache, sweats, rash, change in vision, change in hearing, chest pain, cough, nausea vomiting, urinary symptoms. All other systems are reviewed and are negative.  PHYSICAL EXAM  Patient is oriented to person time and place. Affect is normal. She's here with her very attentive daughter. She looks quite stable. Head is atraumatic. Conjunctiva are normal. There is no jugulovenous distention. Lungs are clear. Respiratory effort is nonlabored. Cardiac exam reveals S1 and S2. She does have a crescendo decrescendo systolic murmur consistent with her mild/moderate aortic stenosis. The abdomen is soft. The bandages in place in her right groin. It appears quite stable. I did not remove the bandage. She has minimal peripheral edema. There no musculoskeletal deformities. There are no skin rashes.  Filed Vitals:   05/18/13 1520  BP: 154/72  Pulse: 62  Height: 5\' 4"  (1.626 m)  Weight: 141 lb 9.6 oz (64.229 kg)     ASSESSMENT & PLAN

## 2013-05-18 NOTE — Telephone Encounter (Signed)
**Note De-Identified  Obfuscation** The pt had a f/u with Dr Ron Parker today. Per Dr Ron Parker I contacted Hassan Rowan at [facility] and asked her to contact Dr Darlin Priestly office to get a order to continue the pts home heath care at the level it is at now. Hassan Rowan verbalized understanding and thanked me for calling them.

## 2013-05-18 NOTE — Assessment & Plan Note (Signed)
The patient does have aortic stenosis there is mild/moderate. No further workup.

## 2013-05-19 DIAGNOSIS — IMO0002 Reserved for concepts with insufficient information to code with codable children: Secondary | ICD-10-CM | POA: Diagnosis not present

## 2013-05-19 DIAGNOSIS — Z4801 Encounter for change or removal of surgical wound dressing: Secondary | ICD-10-CM | POA: Diagnosis not present

## 2013-05-19 DIAGNOSIS — I10 Essential (primary) hypertension: Secondary | ICD-10-CM | POA: Diagnosis not present

## 2013-05-19 DIAGNOSIS — I200 Unstable angina: Secondary | ICD-10-CM | POA: Diagnosis not present

## 2013-05-19 DIAGNOSIS — I2510 Atherosclerotic heart disease of native coronary artery without angina pectoris: Secondary | ICD-10-CM | POA: Diagnosis not present

## 2013-05-19 DIAGNOSIS — E119 Type 2 diabetes mellitus without complications: Secondary | ICD-10-CM | POA: Diagnosis not present

## 2013-05-21 ENCOUNTER — Telehealth: Payer: Self-pay | Admitting: *Deleted

## 2013-05-21 DIAGNOSIS — E1149 Type 2 diabetes mellitus with other diabetic neurological complication: Secondary | ICD-10-CM | POA: Diagnosis not present

## 2013-05-21 DIAGNOSIS — B351 Tinea unguium: Secondary | ICD-10-CM | POA: Diagnosis not present

## 2013-05-21 NOTE — Telephone Encounter (Signed)
Faxed v/o for [facility] - d/c patient & HH

## 2013-05-24 ENCOUNTER — Encounter: Payer: Self-pay | Admitting: Vascular Surgery

## 2013-05-25 ENCOUNTER — Ambulatory Visit: Payer: Medicare Other | Admitting: Vascular Surgery

## 2013-05-25 ENCOUNTER — Encounter: Payer: Self-pay | Admitting: Vascular Surgery

## 2013-05-25 ENCOUNTER — Ambulatory Visit (INDEPENDENT_AMBULATORY_CARE_PROVIDER_SITE_OTHER): Payer: Self-pay | Admitting: Vascular Surgery

## 2013-05-25 VITALS — BP 174/62 | HR 69 | Resp 16 | Ht 63.0 in | Wt 142.0 lb

## 2013-05-25 DIAGNOSIS — I97410 Intraoperative hemorrhage and hematoma of a circulatory system organ or structure complicating a cardiac catheterization: Secondary | ICD-10-CM | POA: Insufficient documentation

## 2013-05-25 DIAGNOSIS — Z9889 Other specified postprocedural states: Secondary | ICD-10-CM

## 2013-05-25 DIAGNOSIS — IMO0002 Reserved for concepts with insufficient information to code with codable children: Secondary | ICD-10-CM

## 2013-05-25 NOTE — Progress Notes (Signed)
Subjective:     Patient ID: Sharon Hubbard, female   DOB: 10/20/1933, 80 y.o.   MRN: [id number]  HPI this 80 year old female returns for continued followup regarding her emergency surgery to repair a punctured right femoral artery from cardiac cath and evacuate large hematoma several weeks ago. She had some slow healing in one area the right inguinal wound and this has been treated with home health 3 times weekly. She denies any pain or numbness in the right lower leg. She has had no chills and fever.  Review of Systems     Objective:   Physical Exam BP 174/62  Pulse 69  Resp 16  Ht 5\' 3"  (1.6 m)  Wt 142 lb (64.411 kg)  BMI 25.16 kg/m2  General well-developed well-nourished female no apparent stress alert and oriented x3 Right inguinal wound essentially healed with 5 mm area in the center portion which is granulating. No evidence of infection or hematoma. All ecchymosis has resolved. 3+ femoral and popliteal pulse palpable well-perfused right foot.     Assessment:     Doing well following repair right femoral artery and evacuation of large hematoma following cardiac catheterization    Plan:     Return to see me on when necessary basis and will discontinue home health nurse visit

## 2013-05-26 DIAGNOSIS — E119 Type 2 diabetes mellitus without complications: Secondary | ICD-10-CM | POA: Diagnosis not present

## 2013-05-26 DIAGNOSIS — IMO0002 Reserved for concepts with insufficient information to code with codable children: Secondary | ICD-10-CM | POA: Diagnosis not present

## 2013-05-26 DIAGNOSIS — I10 Essential (primary) hypertension: Secondary | ICD-10-CM | POA: Diagnosis not present

## 2013-05-26 DIAGNOSIS — Z4801 Encounter for change or removal of surgical wound dressing: Secondary | ICD-10-CM | POA: Diagnosis not present

## 2013-05-26 DIAGNOSIS — I2510 Atherosclerotic heart disease of native coronary artery without angina pectoris: Secondary | ICD-10-CM | POA: Diagnosis not present

## 2013-05-26 DIAGNOSIS — I200 Unstable angina: Secondary | ICD-10-CM | POA: Diagnosis not present

## 2013-06-08 ENCOUNTER — Ambulatory Visit: Payer: Medicare Other | Admitting: Vascular Surgery

## 2013-06-17 ENCOUNTER — Telehealth: Payer: Self-pay | Admitting: Cardiology

## 2013-06-17 NOTE — Telephone Encounter (Signed)
Switch from plavix to prasugrel 10mg  daily.

## 2013-06-17 NOTE — Telephone Encounter (Signed)
Patient walked into the office to speak with the nurse about her itching problem and informed nurse that she thinks that its coming from the plavix. Patient said she has been itching every since March and wasn't aware that the plavix could be causing it. Nurse advised patient that she did have stents placed that she needed to stay on the plavix and in the meantime until we get a response from the doctor, she needed to take some benadryl to help with the itching. Nurse advised patient that the only way to determine if it is coming from the plavix is for her to come off of it. Nurse advised patient that until she hears back from our office, she needed to stay on the plavix and use the benadryl.

## 2013-06-17 NOTE — Telephone Encounter (Signed)
Patient has called back again.  Sharon Hubbard that she is miserable  She has to have something done. She wants to see Dr Vilinda Blanks that she has been doing this for 2 months now and cant take it any longer.

## 2013-06-17 NOTE — Telephone Encounter (Signed)
Mrs. Swann called stating that Plavix 75 mg is causing her to itch. States being going on for some Time.

## 2013-06-18 ENCOUNTER — Other Ambulatory Visit: Payer: Self-pay | Admitting: *Deleted

## 2013-06-18 MED ORDER — PRASUGREL HCL 10 MG PO TABS: 10.0000 mg | ORAL_TABLET | Freq: Every day | ORAL | Status: DC

## 2013-06-18 NOTE — Telephone Encounter (Signed)
Per patient, her pharmacy will not have effient until Monday. Patient informed to stop by the office for samples.

## 2013-06-18 NOTE — Telephone Encounter (Signed)
Patient informed. Prescription and free 30 day voucher faxed to the pharmacy.

## 2013-06-24 DIAGNOSIS — T148XXA Other injury of unspecified body region, initial encounter: Secondary | ICD-10-CM | POA: Diagnosis not present

## 2013-06-24 DIAGNOSIS — L299 Pruritus, unspecified: Secondary | ICD-10-CM | POA: Diagnosis not present

## 2013-06-29 ENCOUNTER — Telehealth: Payer: Self-pay | Admitting: Cardiology

## 2013-06-29 NOTE — Telephone Encounter (Addendum)
Spoke with patient and she still c/o itching after stopping plavix and starting effient. Patient saw PCP , and was given triamcinolone cream for the itching. Patient said it relieves it some but not completely. Nurse advised patient that furosemide could cause itching and that she can hold it in the morning, take benadryl and nurse would contact her back tomorrow after consulting with Dr. Ron Parker. Also, patient has a sulfa drug allergy on file. Sulfa allergy was confirmed with patient. Patient verbalized understanding of plan.

## 2013-06-29 NOTE — Telephone Encounter (Signed)
Mrs.  Hook states that she continues to itch. States she saw Dr. Sherrie Sport last week.

## 2013-06-30 NOTE — Telephone Encounter (Signed)
Patient informed and verbalizes understanding of plan. 

## 2013-06-30 NOTE — Telephone Encounter (Signed)
It is possible that Lasix could be playing a role with her itching. However, she has been on it for a long time. I'm okay with her being off Lasix if her weight remains stable and she doesn't have increased edema or shortness of breath. If this occurs, I do want her to resume her Lasix. My overall preference is that she use the cream for the itching for now and give this process time.

## 2013-07-05 ENCOUNTER — Telehealth: Payer: Self-pay | Admitting: *Deleted

## 2013-07-05 NOTE — Telephone Encounter (Signed)
Patient saw Dr. Zada Hubbard a week ago and was given hydroxyzine 25 mg. Patient has been using triamcinolone 0.1 % cream twice daily for about 2 months. Patient denied changing detergents, powders, perfumes, laundry sheets, soaps or deodorants. Patient said she is sill itching and wanted an appointment with Dr. Ron Hubbard today. Nurse advised patient that per Dr. Ron Hubbard, she needed to see a dermatologist. Patient said she has a dermatologist and that she would give him a call for an appointment. Patient also informed nurse that she started having some swelling in her legs and feet since speaking with our office last. Nurse advised patient to restart her furosemide since this could be the reason for the swelling. Patient verbalized understanding of plan.

## 2013-07-07 DIAGNOSIS — L509 Urticaria, unspecified: Secondary | ICD-10-CM | POA: Diagnosis not present

## 2013-07-07 DIAGNOSIS — L259 Unspecified contact dermatitis, unspecified cause: Secondary | ICD-10-CM | POA: Diagnosis not present

## 2013-07-21 DIAGNOSIS — L259 Unspecified contact dermatitis, unspecified cause: Secondary | ICD-10-CM | POA: Diagnosis not present

## 2013-07-29 ENCOUNTER — Encounter: Payer: Self-pay | Admitting: Cardiology

## 2013-07-29 ENCOUNTER — Ambulatory Visit (INDEPENDENT_AMBULATORY_CARE_PROVIDER_SITE_OTHER): Payer: Medicare Other | Admitting: Cardiology

## 2013-07-29 VITALS — BP 150/60 | HR 60 | Ht 63.0 in | Wt 141.1 lb

## 2013-07-29 DIAGNOSIS — I2510 Atherosclerotic heart disease of native coronary artery without angina pectoris: Secondary | ICD-10-CM

## 2013-07-29 DIAGNOSIS — I200 Unstable angina: Secondary | ICD-10-CM | POA: Diagnosis not present

## 2013-07-29 DIAGNOSIS — I359 Nonrheumatic aortic valve disorder, unspecified: Secondary | ICD-10-CM | POA: Diagnosis not present

## 2013-07-29 DIAGNOSIS — Z9889 Other specified postprocedural states: Secondary | ICD-10-CM

## 2013-07-29 DIAGNOSIS — L299 Pruritus, unspecified: Secondary | ICD-10-CM | POA: Insufficient documentation

## 2013-07-29 DIAGNOSIS — I350 Nonrheumatic aortic (valve) stenosis: Secondary | ICD-10-CM

## 2013-07-29 DIAGNOSIS — R609 Edema, unspecified: Secondary | ICD-10-CM | POA: Insufficient documentation

## 2013-07-29 MED ORDER — CLOPIDOGREL BISULFATE 75 MG PO TABS: 75.0000 mg | ORAL_TABLET | Freq: Every day | ORAL | Status: DC

## 2013-07-29 NOTE — Assessment & Plan Note (Addendum)
The patient has some mild edema. Previously she had been on a diuretic that had helped. More recently this may have been held related to her itching. We are trying to review her medications again. It appears that she is taking Lasix. She is reminded to elevate her feet and to watch her salt and fluid intake.

## 2013-07-29 NOTE — Assessment & Plan Note (Signed)
Coronary disease is stable. She received a drug-eluting stent to the LAD March, 2015. It'll be safe to switch her back to Plavix now from Prasugrel. There is no proof that she had an allergy to Plavix

## 2013-07-29 NOTE — Assessment & Plan Note (Signed)
We know that she has mild to moderate aortic stenosis by echo February, 2015. She will need a followup echo in the future.

## 2013-07-29 NOTE — Progress Notes (Signed)
Patient ID: Sharon Hubbard, female   DOB: 1933/12/20, 80 y.o.   MRN: [id number]    HPI  Patient is seen today in followup coronary disease. She had an intervention. She had to have vascular surgery for the evacuation of hematoma. Eventually she required followup with vascular surgery for delayed healing. She is now been seen in and is completely healed. In the meantime she developed severe itching. Etiology was never clear. We changed multiple medications. She tells me that she saw dermatology and received a shot and medications and improved. It is now time to try to get her back on the appropriate medications. One of the changes was Plavix changed to Prasugrel.  Allergies  Allergen Reactions  . Clarithromycin     REACTION: Vomiting and diarrhea.  . Codeine   . Sulfa Antibiotics   . Atorvastatin Other (See Comments)    Made her knees buckle and muscles ache    Current Outpatient Prescriptions  Medication Sig Dispense Refill  . amLODipine (NORVASC) 10 MG tablet Take 10 mg by mouth daily.        Marland Kitchen aspirin 81 MG tablet Take 81 mg by mouth daily.        . cholecalciferol (VITAMIN D) 1000 UNITS tablet Take 1,000 Units by mouth daily. VITAMIN D3      . divalproex (DEPAKOTE) 500 MG 24 hr tablet Take 500 mg by mouth daily.        . furosemide (LASIX) 40 MG tablet Take 1 tablet (40 mg total) by mouth daily.  90 tablet  0  . glimepiride (AMARYL) 4 MG tablet Take 4 mg by mouth 2 (two) times daily.        Marland Kitchen HYDROcodone-acetaminophen (NORCO) 10-325 MG per tablet Take 1 tablet by mouth every 6 (six) hours as needed for pain.      Marland Kitchen insulin lispro (HUMALOG) 100 UNIT/ML injection Inject 0-10 Units into the skin 3 (three) times daily before meals. Pt is on a sliding scale      . isosorbide mononitrate (IMDUR) 60 MG 24 hr tablet Take 60 mg by mouth daily.      Marland Kitchen latanoprost (XALATAN) 0.005 % ophthalmic solution Place 1 drop into both eyes at bedtime.        . nitroGLYCERIN (NITROSTAT) 0.4 MG SL tablet  Place 0.4 mg under the tongue every 5 (five) minutes as needed for chest pain.      . prasugrel (EFFIENT) 10 MG TABS tablet Take 1 tablet (10 mg total) by mouth daily.  7 tablet  0  . predniSONE (DELTASONE) 1 MG tablet Take 4 mg by mouth every morning.       . timolol (TIMOPTIC) 0.5 % ophthalmic solution Place 1 drop into both eyes daily.        No current facility-administered medications for this visit.    History   Social History  . Marital Status: Married    Spouse Name: N/A    Number of Children: N/A  . Years of Education: N/A   Occupational History  . Not on file.   Social History Main Topics  . Smoking status: Never Smoker   . Smokeless tobacco: Never Used  . Alcohol Use: No  . Drug Use: No  . Sexual Activity: No   Other Topics Concern  . Not on file   Social History Narrative  . No narrative on file    History reviewed. No pertinent family history.  Past Medical History  Diagnosis Date  . CAD (  coronary artery disease)     Interventions in the past  . Groin hematoma     April, 2011  . Hypertension   . Diabetes mellitus   . Dyslipidemia   . GERD (gastroesophageal reflux disease)   . Schatzki's ring   . Renal insufficiency   . Polymyalgia rheumatica   . Osteoarthritis     arthroscopic surgery right knee February, 2012  . Spinal stenosis     history of surgery for this  . Ejection fraction     EF 65%, echo, April, 2012, aortic valve sclerosis with very slight gradient  . Preoperative evaluation to rule out surgical contraindication     Patient needs back surgery by Dr. Carloyn Manner, May, 2012                . Pancreas cyst     The patient has multiple pancreatic cysts.  This is followed at [facility]         . Complication of anesthesia     "felt like I was smothering once with mask on my face"  . RBBB (right bundle branch block)     old  . Bradycardia, sinus   . Myocardial infarction 2011; 08/2010  . Shortness of breath on exertion   . Blood transfusion   .  Anemia   . Stroke 01/18/11    "I've had 2 TIA's"  . Cancer     ""had hysterectomy for a 6 showing cancer; think that's pretty strong  . Aortic stenosis     very mild, echo 12/2010  . Femoral bruit     01/2011 hosp, but no pseudoaneurysm or AV fistula    Past Surgical History  Procedure Laterality Date  . Cholecystectomy  1987  . Appendectomy    . Back surgery    . Lumbar spine surgery  ~ 1977; ~ 2010    "2 hugh ruptured discs; I was paralyzed first OR; 2nd OR by Dr. Carloyn Manner, don't know what for"  . Tubal ligation  1970's  . Dilation and curettage of uterus    . Abdominal hysterectomy  ~ 1974  . Knee arthroscopy  02/2010    right knee; chrondoplasty medial femoral condyle;  Partial medial meniscectomy.   . Coronary angioplasty with stent placement  2011    "in [place]"  . Coronary angioplasty with stent placement  08/2010; 03/25/2013  . Cardiac catheterization  01/18/11  . Femoral artery exploration Right 03/26/2013    Procedure: FEMORAL ARTERY EXPLORATION WITH SUTURE REPAIR; EVACUATION OF HEMATOMA;  Surgeon: Mal Misty, MD;  Location: [place];  Service: Vascular;  Laterality: Right;    Patient Active Problem List   Diagnosis Date Noted  . Femoral bruit     Priority: High  . Itching 07/29/2013  . Femoral artery hematoma complicating cardiac catheterization 05/25/2013  . S/P evacuation of hematoma 04/13/2013  . Thrombocytopenia 03/29/2013  . Shortness of breath on exertion   . Aortic stenosis   . Preoperative evaluation to rule out surgical contraindication   . Pancreas cyst   . CAD (coronary artery disease)   . RBBB (right bundle branch block)   . Groin hematoma   . Hypertension   . Diabetes mellitus   . Dyslipidemia   . GERD (gastroesophageal reflux disease)   . Renal insufficiency   . Polymyalgia rheumatica   . Bradycardia   . Ejection fraction     ROS   Patient denies fever, chills, headache, sweats, rash, change in vision, change in hearing,  chest pain, cough,  nausea or vomiting, urinary symptoms. All other systems are reviewed and are negative.  PHYSICAL EXAM  The patient is oriented to person time and place. She wants to keep talking about the itching that she had had. She thinks it was related to something in the hospital. Head is atraumatic. Sclera and conjunctiva are normal. There is no jugular venous distention. Lungs are clear. Respiratory effort is nonlabored. Cardiac exam reveals S1 and S2. There is a crescendo decrescendo systolic murmur consistent with her diagnosis of mild to moderate aortic stenosis. The abdomen is soft. There is no peripheral edema. There are no musculoskeletal deformities. There are no skin rashes.  Filed Vitals:   07/29/13 1436  BP: 150/60  Pulse: 60  Height: 5\' 3"  (1.6 m)  Weight: 141 lb 1.9 oz (64.012 kg)     ASSESSMENT & PLAN

## 2013-07-29 NOTE — Assessment & Plan Note (Signed)
This area is now completely healed. No further workup.

## 2013-07-29 NOTE — Patient Instructions (Signed)
Your physician has recommended you make the following change in your medication: stop taking Effient and start taking Plavix 75 mg daily  Your physician wants you to follow-up in: 6 months. You will receive a reminder letter in the mail two months in advance. If you don't receive a letter, please call our office to schedule the follow-up appointment.  Please limit your salt and fluid intake and elevate your feet often.

## 2013-07-29 NOTE — Assessment & Plan Note (Signed)
The patient has severe itching. Multiple medicines were switched and she had no response. She tells me that she saw her dermatologist. She tells me that she received a shot and some medications and that this improved. She could not give me a diagnosis. We only know that it was not related to medication.

## 2013-08-13 DIAGNOSIS — M79609 Pain in unspecified limb: Secondary | ICD-10-CM | POA: Diagnosis not present

## 2013-08-13 DIAGNOSIS — B351 Tinea unguium: Secondary | ICD-10-CM | POA: Diagnosis not present

## 2013-08-13 DIAGNOSIS — S9030XA Contusion of unspecified foot, initial encounter: Secondary | ICD-10-CM | POA: Diagnosis not present

## 2013-08-13 DIAGNOSIS — E1149 Type 2 diabetes mellitus with other diabetic neurological complication: Secondary | ICD-10-CM | POA: Diagnosis not present

## 2013-08-13 DIAGNOSIS — S99919A Unspecified injury of unspecified ankle, initial encounter: Secondary | ICD-10-CM | POA: Diagnosis not present

## 2013-08-13 DIAGNOSIS — S8990XA Unspecified injury of unspecified lower leg, initial encounter: Secondary | ICD-10-CM | POA: Diagnosis not present

## 2013-08-24 DIAGNOSIS — I10 Essential (primary) hypertension: Secondary | ICD-10-CM | POA: Diagnosis not present

## 2013-08-24 DIAGNOSIS — S81009A Unspecified open wound, unspecified knee, initial encounter: Secondary | ICD-10-CM | POA: Diagnosis not present

## 2013-08-24 DIAGNOSIS — G40909 Epilepsy, unspecified, not intractable, without status epilepticus: Secondary | ICD-10-CM | POA: Diagnosis not present

## 2013-08-24 DIAGNOSIS — Z8249 Family history of ischemic heart disease and other diseases of the circulatory system: Secondary | ICD-10-CM | POA: Diagnosis not present

## 2013-08-24 DIAGNOSIS — E119 Type 2 diabetes mellitus without complications: Secondary | ICD-10-CM | POA: Diagnosis not present

## 2013-08-24 DIAGNOSIS — Z7982 Long term (current) use of aspirin: Secondary | ICD-10-CM | POA: Diagnosis not present

## 2013-08-24 DIAGNOSIS — Z8541 Personal history of malignant neoplasm of cervix uteri: Secondary | ICD-10-CM | POA: Diagnosis not present

## 2013-08-24 DIAGNOSIS — S61209A Unspecified open wound of unspecified finger without damage to nail, initial encounter: Secondary | ICD-10-CM | POA: Diagnosis not present

## 2013-08-24 DIAGNOSIS — Z23 Encounter for immunization: Secondary | ICD-10-CM | POA: Diagnosis not present

## 2013-08-24 DIAGNOSIS — Z7902 Long term (current) use of antithrombotics/antiplatelets: Secondary | ICD-10-CM | POA: Diagnosis not present

## 2013-08-24 DIAGNOSIS — S61409A Unspecified open wound of unspecified hand, initial encounter: Secondary | ICD-10-CM | POA: Diagnosis not present

## 2013-08-24 DIAGNOSIS — T148XXA Other injury of unspecified body region, initial encounter: Secondary | ICD-10-CM | POA: Diagnosis not present

## 2013-08-24 DIAGNOSIS — S51809A Unspecified open wound of unspecified forearm, initial encounter: Secondary | ICD-10-CM | POA: Diagnosis not present

## 2013-08-24 DIAGNOSIS — Z79899 Other long term (current) drug therapy: Secondary | ICD-10-CM | POA: Diagnosis not present

## 2013-08-24 DIAGNOSIS — I252 Old myocardial infarction: Secondary | ICD-10-CM | POA: Diagnosis not present

## 2013-08-24 DIAGNOSIS — Z833 Family history of diabetes mellitus: Secondary | ICD-10-CM | POA: Diagnosis not present

## 2013-08-24 DIAGNOSIS — Z794 Long term (current) use of insulin: Secondary | ICD-10-CM | POA: Diagnosis not present

## 2013-08-24 DIAGNOSIS — T148 Other injury of unspecified body region: Secondary | ICD-10-CM | POA: Diagnosis not present

## 2013-08-24 DIAGNOSIS — S91009A Unspecified open wound, unspecified ankle, initial encounter: Secondary | ICD-10-CM | POA: Diagnosis not present

## 2013-08-24 DIAGNOSIS — S6990XA Unspecified injury of unspecified wrist, hand and finger(s), initial encounter: Secondary | ICD-10-CM | POA: Diagnosis not present

## 2013-08-24 DIAGNOSIS — I2510 Atherosclerotic heart disease of native coronary artery without angina pectoris: Secondary | ICD-10-CM | POA: Diagnosis not present

## 2013-08-24 DIAGNOSIS — W540XXA Bitten by dog, initial encounter: Secondary | ICD-10-CM | POA: Diagnosis not present

## 2013-08-24 DIAGNOSIS — R030 Elevated blood-pressure reading, without diagnosis of hypertension: Secondary | ICD-10-CM | POA: Diagnosis not present

## 2013-08-24 DIAGNOSIS — S81809A Unspecified open wound, unspecified lower leg, initial encounter: Secondary | ICD-10-CM | POA: Diagnosis not present

## 2013-08-24 DIAGNOSIS — Z9861 Coronary angioplasty status: Secondary | ICD-10-CM | POA: Diagnosis not present

## 2013-08-25 DIAGNOSIS — Z79899 Other long term (current) drug therapy: Secondary | ICD-10-CM | POA: Diagnosis not present

## 2013-08-25 DIAGNOSIS — T07XXXA Unspecified multiple injuries, initial encounter: Secondary | ICD-10-CM | POA: Diagnosis not present

## 2013-08-25 DIAGNOSIS — Z9889 Other specified postprocedural states: Secondary | ICD-10-CM | POA: Diagnosis not present

## 2013-08-25 DIAGNOSIS — S61409A Unspecified open wound of unspecified hand, initial encounter: Secondary | ICD-10-CM | POA: Diagnosis not present

## 2013-08-25 DIAGNOSIS — Z885 Allergy status to narcotic agent status: Secondary | ICD-10-CM | POA: Diagnosis not present

## 2013-08-25 DIAGNOSIS — W540XXA Bitten by dog, initial encounter: Secondary | ICD-10-CM | POA: Diagnosis not present

## 2013-08-25 DIAGNOSIS — T148 Other injury of unspecified body region: Secondary | ICD-10-CM | POA: Diagnosis not present

## 2013-08-25 DIAGNOSIS — I252 Old myocardial infarction: Secondary | ICD-10-CM | POA: Diagnosis not present

## 2013-08-25 DIAGNOSIS — S81009A Unspecified open wound, unspecified knee, initial encounter: Secondary | ICD-10-CM | POA: Diagnosis not present

## 2013-08-25 DIAGNOSIS — S61209A Unspecified open wound of unspecified finger without damage to nail, initial encounter: Secondary | ICD-10-CM | POA: Diagnosis not present

## 2013-08-25 DIAGNOSIS — E119 Type 2 diabetes mellitus without complications: Secondary | ICD-10-CM | POA: Diagnosis not present

## 2013-08-26 ENCOUNTER — Other Ambulatory Visit: Payer: Self-pay | Admitting: Cardiology

## 2013-09-01 DIAGNOSIS — G40909 Epilepsy, unspecified, not intractable, without status epilepticus: Secondary | ICD-10-CM | POA: Diagnosis not present

## 2013-09-01 DIAGNOSIS — Z7982 Long term (current) use of aspirin: Secondary | ICD-10-CM | POA: Diagnosis not present

## 2013-09-01 DIAGNOSIS — Z833 Family history of diabetes mellitus: Secondary | ICD-10-CM | POA: Diagnosis not present

## 2013-09-01 DIAGNOSIS — Z8249 Family history of ischemic heart disease and other diseases of the circulatory system: Secondary | ICD-10-CM | POA: Diagnosis not present

## 2013-09-01 DIAGNOSIS — Z4789 Encounter for other orthopedic aftercare: Secondary | ICD-10-CM | POA: Diagnosis not present

## 2013-09-01 DIAGNOSIS — I10 Essential (primary) hypertension: Secondary | ICD-10-CM | POA: Diagnosis not present

## 2013-09-01 DIAGNOSIS — Z8541 Personal history of malignant neoplasm of cervix uteri: Secondary | ICD-10-CM | POA: Diagnosis not present

## 2013-09-01 DIAGNOSIS — E119 Type 2 diabetes mellitus without complications: Secondary | ICD-10-CM | POA: Diagnosis not present

## 2013-09-01 DIAGNOSIS — Z79899 Other long term (current) drug therapy: Secondary | ICD-10-CM | POA: Diagnosis not present

## 2013-09-01 DIAGNOSIS — Z9861 Coronary angioplasty status: Secondary | ICD-10-CM | POA: Diagnosis not present

## 2013-09-01 DIAGNOSIS — I2510 Atherosclerotic heart disease of native coronary artery without angina pectoris: Secondary | ICD-10-CM | POA: Diagnosis not present

## 2013-09-01 DIAGNOSIS — Z794 Long term (current) use of insulin: Secondary | ICD-10-CM | POA: Diagnosis not present

## 2013-09-02 DIAGNOSIS — I10 Essential (primary) hypertension: Secondary | ICD-10-CM | POA: Diagnosis not present

## 2013-09-02 DIAGNOSIS — Z7982 Long term (current) use of aspirin: Secondary | ICD-10-CM | POA: Diagnosis not present

## 2013-09-02 DIAGNOSIS — H409 Unspecified glaucoma: Secondary | ICD-10-CM | POA: Diagnosis not present

## 2013-09-02 DIAGNOSIS — E860 Dehydration: Secondary | ICD-10-CM | POA: Diagnosis not present

## 2013-09-02 DIAGNOSIS — G8929 Other chronic pain: Secondary | ICD-10-CM | POA: Diagnosis not present

## 2013-09-02 DIAGNOSIS — M549 Dorsalgia, unspecified: Secondary | ICD-10-CM | POA: Diagnosis not present

## 2013-09-02 DIAGNOSIS — IMO0001 Reserved for inherently not codable concepts without codable children: Secondary | ICD-10-CM | POA: Diagnosis not present

## 2013-09-02 DIAGNOSIS — Z79899 Other long term (current) drug therapy: Secondary | ICD-10-CM | POA: Diagnosis not present

## 2013-09-02 DIAGNOSIS — R079 Chest pain, unspecified: Secondary | ICD-10-CM | POA: Diagnosis not present

## 2013-09-02 DIAGNOSIS — F3189 Other bipolar disorder: Secondary | ICD-10-CM | POA: Diagnosis not present

## 2013-09-02 DIAGNOSIS — E119 Type 2 diabetes mellitus without complications: Secondary | ICD-10-CM | POA: Diagnosis not present

## 2013-09-02 DIAGNOSIS — R55 Syncope and collapse: Secondary | ICD-10-CM | POA: Diagnosis not present

## 2013-09-02 DIAGNOSIS — K449 Diaphragmatic hernia without obstruction or gangrene: Secondary | ICD-10-CM | POA: Diagnosis not present

## 2013-09-02 DIAGNOSIS — R42 Dizziness and giddiness: Secondary | ICD-10-CM | POA: Diagnosis not present

## 2013-09-02 DIAGNOSIS — Z882 Allergy status to sulfonamides status: Secondary | ICD-10-CM | POA: Diagnosis not present

## 2013-09-02 DIAGNOSIS — Z885 Allergy status to narcotic agent status: Secondary | ICD-10-CM | POA: Diagnosis not present

## 2013-09-02 DIAGNOSIS — I2510 Atherosclerotic heart disease of native coronary artery without angina pectoris: Secondary | ICD-10-CM | POA: Diagnosis not present

## 2013-09-02 DIAGNOSIS — Z888 Allergy status to other drugs, medicaments and biological substances status: Secondary | ICD-10-CM | POA: Diagnosis not present

## 2013-09-02 DIAGNOSIS — S61409A Unspecified open wound of unspecified hand, initial encounter: Secondary | ICD-10-CM | POA: Diagnosis not present

## 2013-09-02 DIAGNOSIS — Z9861 Coronary angioplasty status: Secondary | ICD-10-CM | POA: Diagnosis not present

## 2013-09-02 DIAGNOSIS — E876 Hypokalemia: Secondary | ICD-10-CM | POA: Diagnosis not present

## 2013-09-03 DIAGNOSIS — R42 Dizziness and giddiness: Secondary | ICD-10-CM | POA: Diagnosis not present

## 2013-09-03 DIAGNOSIS — R079 Chest pain, unspecified: Secondary | ICD-10-CM | POA: Diagnosis not present

## 2013-09-03 DIAGNOSIS — E860 Dehydration: Secondary | ICD-10-CM | POA: Diagnosis not present

## 2013-09-03 DIAGNOSIS — R55 Syncope and collapse: Secondary | ICD-10-CM | POA: Diagnosis not present

## 2013-09-03 DIAGNOSIS — E876 Hypokalemia: Secondary | ICD-10-CM | POA: Diagnosis not present

## 2013-09-04 DIAGNOSIS — R55 Syncope and collapse: Secondary | ICD-10-CM | POA: Diagnosis not present

## 2013-09-04 DIAGNOSIS — R42 Dizziness and giddiness: Secondary | ICD-10-CM | POA: Diagnosis not present

## 2013-09-04 DIAGNOSIS — R079 Chest pain, unspecified: Secondary | ICD-10-CM | POA: Diagnosis not present

## 2013-09-04 DIAGNOSIS — E860 Dehydration: Secondary | ICD-10-CM | POA: Diagnosis not present

## 2013-09-04 DIAGNOSIS — E876 Hypokalemia: Secondary | ICD-10-CM | POA: Diagnosis not present

## 2013-09-07 DIAGNOSIS — Z4802 Encounter for removal of sutures: Secondary | ICD-10-CM | POA: Diagnosis not present

## 2013-10-04 DIAGNOSIS — R319 Hematuria, unspecified: Secondary | ICD-10-CM | POA: Diagnosis not present

## 2013-10-20 DIAGNOSIS — L570 Actinic keratosis: Secondary | ICD-10-CM | POA: Diagnosis not present

## 2013-10-20 DIAGNOSIS — Z85828 Personal history of other malignant neoplasm of skin: Secondary | ICD-10-CM | POA: Diagnosis not present

## 2013-11-08 DIAGNOSIS — R3915 Urgency of urination: Secondary | ICD-10-CM | POA: Diagnosis not present

## 2013-11-08 DIAGNOSIS — R351 Nocturia: Secondary | ICD-10-CM | POA: Diagnosis not present

## 2013-11-08 DIAGNOSIS — K5900 Constipation, unspecified: Secondary | ICD-10-CM | POA: Diagnosis not present

## 2013-11-08 DIAGNOSIS — N3941 Urge incontinence: Secondary | ICD-10-CM | POA: Diagnosis not present

## 2013-11-29 DIAGNOSIS — R609 Edema, unspecified: Secondary | ICD-10-CM | POA: Diagnosis not present

## 2013-11-29 DIAGNOSIS — E1165 Type 2 diabetes mellitus with hyperglycemia: Secondary | ICD-10-CM | POA: Diagnosis not present

## 2013-11-29 DIAGNOSIS — I10 Essential (primary) hypertension: Secondary | ICD-10-CM | POA: Diagnosis not present

## 2013-11-29 DIAGNOSIS — Z79899 Other long term (current) drug therapy: Secondary | ICD-10-CM | POA: Diagnosis not present

## 2013-12-07 DIAGNOSIS — E1342 Other specified diabetes mellitus with diabetic polyneuropathy: Secondary | ICD-10-CM | POA: Diagnosis not present

## 2013-12-07 DIAGNOSIS — B351 Tinea unguium: Secondary | ICD-10-CM | POA: Diagnosis not present

## 2013-12-07 DIAGNOSIS — N905 Atrophy of vulva: Secondary | ICD-10-CM | POA: Diagnosis not present

## 2013-12-07 DIAGNOSIS — R3915 Urgency of urination: Secondary | ICD-10-CM | POA: Diagnosis not present

## 2013-12-07 DIAGNOSIS — R351 Nocturia: Secondary | ICD-10-CM | POA: Diagnosis not present

## 2013-12-07 DIAGNOSIS — N3941 Urge incontinence: Secondary | ICD-10-CM | POA: Diagnosis not present

## 2013-12-08 DIAGNOSIS — H1131 Conjunctival hemorrhage, right eye: Secondary | ICD-10-CM | POA: Diagnosis not present

## 2013-12-30 ENCOUNTER — Encounter (HOSPITAL_COMMUNITY): Payer: Self-pay | Admitting: Cardiology

## 2014-01-04 DIAGNOSIS — R3915 Urgency of urination: Secondary | ICD-10-CM | POA: Diagnosis not present

## 2014-01-04 DIAGNOSIS — K5900 Constipation, unspecified: Secondary | ICD-10-CM | POA: Diagnosis not present

## 2014-01-04 DIAGNOSIS — R351 Nocturia: Secondary | ICD-10-CM | POA: Diagnosis not present

## 2014-01-04 DIAGNOSIS — N3941 Urge incontinence: Secondary | ICD-10-CM | POA: Diagnosis not present

## 2014-01-05 DIAGNOSIS — M47816 Spondylosis without myelopathy or radiculopathy, lumbar region: Secondary | ICD-10-CM | POA: Diagnosis not present

## 2014-01-28 ENCOUNTER — Encounter: Payer: Self-pay | Admitting: Cardiology

## 2014-01-28 ENCOUNTER — Ambulatory Visit (INDEPENDENT_AMBULATORY_CARE_PROVIDER_SITE_OTHER): Payer: Medicare Other | Admitting: Cardiology

## 2014-01-28 VITALS — BP 124/62 | HR 55 | Ht 63.0 in | Wt 140.6 lb

## 2014-01-28 DIAGNOSIS — I350 Nonrheumatic aortic (valve) stenosis: Secondary | ICD-10-CM | POA: Diagnosis not present

## 2014-01-28 DIAGNOSIS — R609 Edema, unspecified: Secondary | ICD-10-CM | POA: Diagnosis not present

## 2014-01-28 DIAGNOSIS — N289 Disorder of kidney and ureter, unspecified: Secondary | ICD-10-CM

## 2014-01-28 DIAGNOSIS — I2510 Atherosclerotic heart disease of native coronary artery without angina pectoris: Secondary | ICD-10-CM | POA: Diagnosis not present

## 2014-01-28 LAB — BASIC METABOLIC PANEL WITH GFR
BUN: 22 mg/dL (ref 6–23)
CO2: 22 meq/L (ref 19–32)
Calcium: 9.7 mg/dL (ref 8.4–10.5)
Chloride: 103 meq/L (ref 96–112)
Creat: 1.48 mg/dL — ABNORMAL HIGH (ref 0.50–1.10)
Glucose, Bld: 172 mg/dL — ABNORMAL HIGH (ref 70–99)
Potassium: 4.8 meq/L (ref 3.5–5.3)
Sodium: 137 meq/L (ref 135–145)

## 2014-01-28 MED ORDER — FUROSEMIDE 40 MG PO TABS: 60.0000 mg | ORAL_TABLET | Freq: Every day | ORAL | Status: DC

## 2014-01-28 MED ORDER — NITROGLYCERIN 0.4 MG SL SUBL: 0.4000 mg | SUBLINGUAL_TABLET | SUBLINGUAL | Status: DC | PRN

## 2014-01-28 NOTE — Assessment & Plan Note (Signed)
The patient has persistent edema. It improves at night time but does not disappear completely. There may be an element of venous insufficiency. I decided to increase her furosemide from 40-60 mg daily. I've talked with her about watching her salt and total fluid intake. Two-dimensional echo will help also with the reassessment of her LV function and her aortic valve.

## 2014-01-28 NOTE — Progress Notes (Signed)
Patient ID: Sharon Hubbard, female   DOB: Feb 20, 1933, 81 y.o.   MRN: [id number]    HPI Patient is seen today to follow-up coronary disease. She had a coronary intervention. She had a significant hematoma and eventually she required vascular surgery for delayed healing. She has not had significant recurrent chest pain. She did not feel well with Plavix because of itching. However eventually she was able to go back on Plavix. She is required furosemide 40 mg daily. She still has some peripheral edema. Her salt intake is moderate and her fluid intake is moderate.  Allergies  Allergen Reactions  . Clarithromycin     REACTION: Vomiting and diarrhea.  . Codeine   . Sulfa Antibiotics   . Atorvastatin Other (See Comments)    Made her knees buckle and muscles ache    Current Outpatient Prescriptions  Medication Sig Dispense Refill  . amLODipine (NORVASC) 10 MG tablet Take 10 mg by mouth daily.      Marland Kitchen aspirin 81 MG tablet Take 81 mg by mouth daily.      . clopidogrel (PLAVIX) 75 MG tablet Take 1 tablet (75 mg total) by mouth daily. 90 tablet 3  . divalproex (DEPAKOTE) 500 MG 24 hr tablet Take 500 mg by mouth daily.      . furosemide (LASIX) 40 MG tablet Take 1 tablet (40 mg total) by mouth daily. 90 tablet 0  . glimepiride (AMARYL) 4 MG tablet Take 4 mg by mouth 2 (two) times daily.      Marland Kitchen HYDROcodone-acetaminophen (NORCO) 10-325 MG per tablet Take 1 tablet by mouth every 6 (six) hours as needed for pain.    Marland Kitchen insulin lispro (HUMALOG) 100 UNIT/ML injection Inject 0-10 Units into the skin 3 (three) times daily before meals. Pt is on a sliding scale    . isosorbide mononitrate (IMDUR) 60 MG 24 hr tablet TAKE ONE TABLET BY MOUTH EVERY DAY 90 tablet 0  . latanoprost (XALATAN) 0.005 % ophthalmic solution Place 1 drop into both eyes at bedtime.      Marland Kitchen lisinopril (PRINIVIL,ZESTRIL) 20 MG tablet Take 20 mg by mouth daily.    . mirabegron ER (MYRBETRIQ) 25 MG TB24 tablet Take 25 mg by mouth daily.      . nitroGLYCERIN (NITROSTAT) 0.4 MG SL tablet Place 0.4 mg under the tongue every 5 (five) minutes as needed for chest pain.    . pantoprazole (PROTONIX) 40 MG tablet Take 40 mg by mouth daily.    . potassium chloride SA (K-DUR,KLOR-CON) 20 MEQ tablet Take 20 mEq by mouth daily.    . predniSONE (DELTASONE) 1 MG tablet Take 4 mg by mouth every morning.     . rosuvastatin (CRESTOR) 20 MG tablet Take 20 mg by mouth daily.    . timolol (TIMOPTIC) 0.5 % ophthalmic solution Place 1 drop into both eyes daily.      No current facility-administered medications for this visit.    History   Social History  . Marital Status: Married    Spouse Name: N/A    Number of Children: N/A  . Years of Education: N/A   Occupational History  . Not on file.   Social History Main Topics  . Smoking status: Never Smoker   . Smokeless tobacco: Never Used  . Alcohol Use: No  . Drug Use: No  . Sexual Activity: No   Other Topics Concern  . Not on file   Social History Narrative    History reviewed. No pertinent family  history.  Past Medical History  Diagnosis Date  . CAD (coronary artery disease)     Interventions in the past  . Groin hematoma     April, 2011  . Hypertension   . Diabetes mellitus   . Dyslipidemia   . GERD (gastroesophageal reflux disease)   . Schatzki's ring   . Renal insufficiency   . Polymyalgia rheumatica   . Osteoarthritis     arthroscopic surgery right knee February, 2012  . Spinal stenosis     history of surgery for this  . Ejection fraction     EF 65%, echo, April, 2012, aortic valve sclerosis with very slight gradient  . Preoperative evaluation to rule out surgical contraindication     Patient needs back surgery by Dr. Carloyn Manner, May, 2012                . Pancreas cyst     The patient has multiple pancreatic cysts.  This is [facility] at [facility]         . Complication of anesthesia     "felt like I was smothering once with mask on my face"  . RBBB (right bundle branch  block)     old  . Bradycardia, sinus   . Myocardial infarction 2011; 08/2010  . Shortness of breath on exertion   . Blood transfusion   . Anemia   . Stroke 01/18/11    "I've had 2 TIA's"  . Cancer     ""had hysterectomy for a 6 showing cancer; think that's pretty strong  . Aortic stenosis     very mild, echo 12/2010  . Femoral bruit     01/2011 hosp, but no pseudoaneurysm or AV fistula    Past Surgical History  Procedure Laterality Date  . Cholecystectomy  1987  . Appendectomy    . Back surgery    . Lumbar spine surgery  ~ 1977; ~ 2010    "2 hugh ruptured discs; I was paralyzed first OR; 2nd OR by Dr. Carloyn Manner, don't know what for"  . Tubal ligation  1970's  . Dilation and curettage of uterus    . Abdominal hysterectomy  ~ 1974  . Knee arthroscopy  02/2010    right knee; chrondoplasty medial femoral condyle;  Partial medial meniscectomy.   . Coronary angioplasty with stent placement  2011    "in [place]"  . Coronary angioplasty with stent placement  08/2010; 03/25/2013  . Cardiac catheterization  01/18/11  . Femoral artery exploration Right 03/26/2013    Procedure: FEMORAL ARTERY EXPLORATION WITH SUTURE REPAIR; EVACUATION OF HEMATOMA;  Surgeon: Mal Misty, MD;  Location: [place];  Service: Vascular;  Laterality: Right;  . Coronary angiogram  01/18/2011    Procedure: CORONARY ANGIOGRAM;  Surgeon: Larey Dresser, MD;  Location: [facility] CATH LAB;  Service: Cardiovascular;;  . Left and right heart catheterization with coronary angiogram N/A 03/25/2013    Procedure: LEFT AND RIGHT HEART CATHETERIZATION WITH CORONARY ANGIOGRAM;  Surgeon: Burnell Blanks, MD;  Location: [facility] CATH LAB;  Service: Cardiovascular;  Laterality: N/A;    Patient Active Problem List   Diagnosis Date Noted  . Femoral bruit     Priority: High  . Itching 07/29/2013  . Edema 07/29/2013  . Femoral artery hematoma complicating cardiac catheterization 05/25/2013  . S/P evacuation of hematoma 04/13/2013  .  Thrombocytopenia 03/29/2013  . Shortness of breath on exertion   . Aortic stenosis   . Preoperative evaluation to rule out surgical contraindication   .  Pancreas cyst   . CAD (coronary artery disease)   . RBBB (right bundle branch block)   . Groin hematoma   . Hypertension   . Diabetes mellitus   . Dyslipidemia   . GERD (gastroesophageal reflux disease)   . Renal insufficiency   . Polymyalgia rheumatica   . Bradycardia   . Ejection fraction     ROS  Patient denies fever, chills, headache, sweats, rash, change in vision, change in hearing, chest pain, cough, nausea or vomiting, urinary symptoms. All other systems are reviewed and are negative.  PHYSICAL EXAM Patient is oriented to person time and place. Affect is normal. She is here with her son. Head is atraumatic. Sclera and conjunctiva are normal. There is no jugular venous distention. Lungs reveal a few scattered rhonchi. Cardiac exam reveals S1 and S2. There is a systolic murmur. The abdomen is soft. She does have 1+ bilateral peripheral edema. There are no musculoskeletal deformities. There are no skin rashes.  Filed Vitals:   01/28/14 1518  BP: 124/62  Pulse: 55  Height: 5\' 3"  (1.6 m)  Weight: 140 lb 9.6 oz (63.776 kg)  SpO2: 99%     ASSESSMENT & PLAN

## 2014-01-28 NOTE — Assessment & Plan Note (Signed)
The patient had a drug-eluting stent to the LAD in March, 2015. There was other disease in a vessel that was too small to stent. Her overall coronary status is stable. She says that she did take nitroglycerin on one occasion and it seemed to help. She has had good left ventricular function. I plan to continue her dual antiplatelet therapy at this time.

## 2014-01-28 NOTE — Assessment & Plan Note (Signed)
The patient does have some aortic stenosis. She is retaining fluid. We need to see if her left ear has become more significant. She will have a follow-up 2-D echo to assess LV function and aortic valve function.

## 2014-01-28 NOTE — Assessment & Plan Note (Signed)
Patient has had some mild renal insufficiency. Chemistry will be checked today.

## 2014-01-28 NOTE — Patient Instructions (Addendum)
Your physician has recommended you make the following change in your medication: increase Furosemide (Lasix) to 60 mg daily  Your physician has requested that you have an echocardiogram. Echocardiography is a painless test that uses sound waves to create images of your heart. It provides your doctor with information about the size and shape of your heart and how well your heart's chambers and valves are working. This procedure takes approximately one hour. There are no restrictions for this procedure.    Your physician recommends that you return for lab work in: BMET today  Your physician recommends that you schedule a follow-up appointment in: 6 to 8 weeks in the [place] office

## 2014-02-01 ENCOUNTER — Telehealth: Payer: Self-pay | Admitting: *Deleted

## 2014-02-01 DIAGNOSIS — N289 Disorder of kidney and ureter, unspecified: Secondary | ICD-10-CM

## 2014-02-01 NOTE — Telephone Encounter (Signed)
-----   Message from [place], LPN sent at 4/46/2863  7:44 AM EST ----- Sharon Hubbard, I hope you are doing well. This pt had an OV with Dr Ron Parker on Friday (1/8) and requested to be seen in the [place] office as she lives in [place]. Dr Ron Parker would like for her to have a BMET drawn on the same day as her Echo which is scheduled to be done in [place] on 1/14. Can you please call the pt and arrange as Im not sure where she is to go for her BMET. If there is anything I need to do please let me know. Thanks for your help, Jeani Hawking ----- Message -----    From: Carlena Bjornstad, MD    Sent: 01/29/2014   3:31 PM      To: Deliah [place] Via, LPN  Her kidney function test has changed a little. We increased her diuretic. Please arrange BMet  In 7-10 days to check again. Draw blood close to home if possible.

## 2014-02-02 NOTE — Telephone Encounter (Signed)
Pt is coming in for echo 02/03/14 @12pm  and will pick up lab orders while she is here and will have labs drawn at [facility].

## 2014-02-02 NOTE — Telephone Encounter (Signed)
Patient returned call. She will be home until 10:15 this am

## 2014-02-03 ENCOUNTER — Other Ambulatory Visit: Payer: Self-pay

## 2014-02-03 ENCOUNTER — Other Ambulatory Visit (INDEPENDENT_AMBULATORY_CARE_PROVIDER_SITE_OTHER): Payer: Medicare Other

## 2014-02-03 DIAGNOSIS — I2510 Atherosclerotic heart disease of native coronary artery without angina pectoris: Secondary | ICD-10-CM | POA: Diagnosis not present

## 2014-02-03 DIAGNOSIS — N289 Disorder of kidney and ureter, unspecified: Secondary | ICD-10-CM | POA: Diagnosis not present

## 2014-02-03 DIAGNOSIS — I350 Nonrheumatic aortic (valve) stenosis: Secondary | ICD-10-CM | POA: Diagnosis not present

## 2014-02-07 ENCOUNTER — Other Ambulatory Visit: Payer: Self-pay | Admitting: *Deleted

## 2014-02-07 DIAGNOSIS — N289 Disorder of kidney and ureter, unspecified: Secondary | ICD-10-CM

## 2014-02-15 ENCOUNTER — Telehealth: Payer: Self-pay | Admitting: *Deleted

## 2014-02-15 NOTE — Telephone Encounter (Signed)
Patient informed. 

## 2014-02-15 NOTE — Telephone Encounter (Signed)
-----   Message from Sharon Bjornstad, MD sent at 02/13/2014  1:29 PM EST ----- Please notify patient that chemistry lab done recently is stable for now.

## 2014-02-25 DIAGNOSIS — B351 Tinea unguium: Secondary | ICD-10-CM | POA: Diagnosis not present

## 2014-02-25 DIAGNOSIS — E1342 Other specified diabetes mellitus with diabetic polyneuropathy: Secondary | ICD-10-CM | POA: Diagnosis not present

## 2014-03-01 DIAGNOSIS — E1165 Type 2 diabetes mellitus with hyperglycemia: Secondary | ICD-10-CM | POA: Diagnosis not present

## 2014-03-01 DIAGNOSIS — Z1389 Encounter for screening for other disorder: Secondary | ICD-10-CM | POA: Diagnosis not present

## 2014-03-01 DIAGNOSIS — Z0000 Encounter for general adult medical examination without abnormal findings: Secondary | ICD-10-CM | POA: Diagnosis not present

## 2014-03-01 DIAGNOSIS — I10 Essential (primary) hypertension: Secondary | ICD-10-CM | POA: Diagnosis not present

## 2014-03-09 ENCOUNTER — Encounter: Payer: Self-pay | Admitting: Cardiology

## 2014-03-09 ENCOUNTER — Ambulatory Visit (INDEPENDENT_AMBULATORY_CARE_PROVIDER_SITE_OTHER): Payer: Medicare Other | Admitting: Cardiology

## 2014-03-09 VITALS — BP 130/62 | HR 59 | Ht 64.0 in | Wt 138.0 lb

## 2014-03-09 DIAGNOSIS — I350 Nonrheumatic aortic (valve) stenosis: Secondary | ICD-10-CM

## 2014-03-09 DIAGNOSIS — R609 Edema, unspecified: Secondary | ICD-10-CM | POA: Diagnosis not present

## 2014-03-09 DIAGNOSIS — I4510 Unspecified right bundle-branch block: Secondary | ICD-10-CM

## 2014-03-09 DIAGNOSIS — N183 Chronic kidney disease, stage 3 unspecified: Secondary | ICD-10-CM

## 2014-03-09 DIAGNOSIS — I2510 Atherosclerotic heart disease of native coronary artery without angina pectoris: Secondary | ICD-10-CM | POA: Diagnosis not present

## 2014-03-09 MED ORDER — FUROSEMIDE 40 MG PO TABS: 40.0000 mg | ORAL_TABLET | Freq: Every day | ORAL | Status: DC

## 2014-03-09 NOTE — Assessment & Plan Note (Signed)
Coronary disease is stable. No change in therapy. 

## 2014-03-09 NOTE — Progress Notes (Signed)
Cardiology Office Note   Date:  03/09/2014   ID:  Sharon Hubbard, DOB 10-25-1933, MRN [id number]  PCP:  Neale Burly, MD  Cardiologist:  Dola Argyle, MD   Chief Complaint  Patient presents with  . Appointment    Follow-up coronary artery disease      History of Present Illness: Sharon Hubbard is a 81 y.o. female who presents today to follow-up coronary artery disease. I saw her last on January 28, 2014. She had some mild edema. I increased her Lasix from 40-60 mg daily. Follow-up of her labs on February 15, 2013 digits show some increase in her BUN and creatinine. Two-dimensional echo was done. She has excellent systolic left ventricular function with an ejection fraction of 65% or higher. She does have moderate aortic stenosis. She is feeling well. She's not having any shortness of breath or chest pain.    Past Medical History  Diagnosis Date  . CAD (coronary artery disease)     Interventions in the past  . Groin hematoma     April, 2011  . Hypertension   . Diabetes mellitus   . Dyslipidemia   . GERD (gastroesophageal reflux disease)   . Schatzki's ring   . Renal insufficiency   . Polymyalgia rheumatica   . Osteoarthritis     arthroscopic surgery right knee February, 2012  . Spinal stenosis     history of surgery for this  . Ejection fraction     EF 65%, echo, April, 2012, aortic valve sclerosis with very slight gradient  . Preoperative evaluation to rule out surgical contraindication     Patient needs back surgery by Dr. Carloyn Manner, May, 2012                . Pancreas cyst     The patient has multiple pancreatic cysts.  This is followed at [facility]         . Complication of anesthesia     "felt like I was smothering once with mask on my face"  . RBBB (right bundle branch block)     old  . Bradycardia, sinus   . Myocardial infarction 2011; 08/2010  . Shortness of breath on exertion   . Blood transfusion   . Anemia   . Stroke 01/18/11    "I've had 2 TIA's"    . Cancer     ""had hysterectomy for a 6 showing cancer; think that's pretty strong  . Aortic stenosis     very mild, echo 12/2010  . Femoral bruit     01/2011 hosp, but no pseudoaneurysm or AV fistula    Past Surgical History  Procedure Laterality Date  . Cholecystectomy  1987  . Appendectomy    . Back surgery    . Lumbar spine surgery  ~ 1977; ~ 2010    "2 hugh ruptured discs; I was paralyzed first OR; 2nd OR by Dr. Carloyn Manner, don't know what for"  . Tubal ligation  1970's  . Dilation and curettage of uterus    . Abdominal hysterectomy  ~ 1974  . Knee arthroscopy  02/2010    right knee; chrondoplasty medial femoral condyle;  Partial medial meniscectomy.   . Coronary angioplasty with stent placement  2011    "in [place]"  . Coronary angioplasty with stent placement  08/2010; 03/25/2013  . Cardiac catheterization  01/18/11  . Femoral artery exploration Right 03/26/2013    Procedure: FEMORAL ARTERY EXPLORATION WITH SUTURE REPAIR; EVACUATION OF HEMATOMA;  Surgeon: Jeneen Rinks  Rockwell Alexandria, MD;  Location: [facility];  Service: Vascular;  Laterality: Right;  . Coronary angiogram  01/18/2011    Procedure: CORONARY ANGIOGRAM;  Surgeon: Larey Dresser, MD;  Location: [facility] CATH LAB;  Service: Cardiovascular;;  . Left and right heart catheterization with coronary angiogram N/A 03/25/2013    Procedure: LEFT AND RIGHT HEART CATHETERIZATION WITH CORONARY ANGIOGRAM;  Surgeon: Burnell Blanks, MD;  Location: [facility] CATH LAB;  Service: Cardiovascular;  Laterality: N/A;    Patient Active Problem List   Diagnosis Date Noted  . Femoral bruit     Priority: High  . Itching 07/29/2013  . Edema 07/29/2013  . Femoral artery hematoma complicating cardiac catheterization 05/25/2013  . S/P evacuation of hematoma 04/13/2013  . Thrombocytopenia 03/29/2013  . Shortness of breath on exertion   . Aortic stenosis   . Preoperative evaluation to rule out surgical contraindication   . Pancreas cyst   . CAD (coronary artery disease)    . RBBB (right bundle branch block)   . Groin hematoma   . Hypertension   . Diabetes mellitus   . Dyslipidemia   . GERD (gastroesophageal reflux disease)   . Renal insufficiency   . Polymyalgia rheumatica   . Bradycardia   . Ejection fraction       Current Outpatient Prescriptions  Medication Sig Dispense Refill  . amLODipine (NORVASC) 10 MG tablet Take 10 mg by mouth daily.      Marland Kitchen aspirin 81 MG tablet Take 81 mg by mouth daily.      . clopidogrel (PLAVIX) 75 MG tablet Take 1 tablet (75 mg total) by mouth daily. 90 tablet 3  . divalproex (DEPAKOTE) 500 MG 24 hr tablet Take 500 mg by mouth daily.      . furosemide (LASIX) 40 MG tablet Take 1.5 tablets (60 mg total) by mouth daily. Increase in dose 135 tablet 0  . glimepiride (AMARYL) 4 MG tablet Take 4 mg by mouth 2 (two) times daily.      Marland Kitchen HYDROcodone-acetaminophen (NORCO) 10-325 MG per tablet Take 1 tablet by mouth every 6 (six) hours as needed for pain.    Marland Kitchen insulin lispro (HUMALOG) 100 UNIT/ML injection Inject 0-10 Units into the skin 3 (three) times daily before meals. Pt is on a sliding scale    . isosorbide mononitrate (IMDUR) 60 MG 24 hr tablet TAKE ONE TABLET BY MOUTH EVERY DAY 90 tablet 0  . latanoprost (XALATAN) 0.005 % ophthalmic solution Place 1 drop into both eyes at bedtime.      Marland Kitchen lisinopril (PRINIVIL,ZESTRIL) 20 MG tablet Take 20 mg by mouth daily.    . mirabegron ER (MYRBETRIQ) 25 MG TB24 tablet Take 25 mg by mouth daily.    . nitroGLYCERIN (NITROSTAT) 0.4 MG SL tablet Place 1 tablet (0.4 mg total) under the tongue every 5 (five) minutes as needed for chest pain. 30 tablet 1  . pantoprazole (PROTONIX) 40 MG tablet Take 40 mg by mouth daily.    . potassium chloride SA (K-DUR,KLOR-CON) 20 MEQ tablet Take 20 mEq by mouth daily.    . predniSONE (DELTASONE) 1 MG tablet Take 4 mg by mouth every morning.     . rosuvastatin (CRESTOR) 20 MG tablet Take 20 mg by mouth daily.    . timolol (TIMOPTIC) 0.5 % ophthalmic solution  Place 1 drop into both eyes daily.      No current facility-administered medications for this visit.    Allergies:   Clarithromycin; Codeine; Sulfa antibiotics; and Atorvastatin  Social History:  The patient  reports that she has never smoked. She has never used smokeless tobacco. She reports that she does not drink alcohol or use illicit drugs.   Family History: There is no significant family history of coronary disease or CHF.   ROS:  Please see the history of present illness.  Patient denies fever, chills, headache, sweats, rash, change in vision, change in hearing, chest pain, cough, nausea or vomiting, urinary symptoms. All other systems are reviewed and are negative.      PHYSICAL EXAM: VS:  BP 130/62 mmHg  Pulse 59  Ht 5\' 4"  (1.626 m)  Wt 138 lb (62.596 kg)  BMI 23.68 kg/m2  SpO2 98% , Patient is oriented to person time and place. Affect is normal. Head is atraumatic. Sclera and conjunctiva are normal. There is no jugulovenous distention. Lungs are clear. Respiratory effort is not labored. Cardiac exam reveals S1 and S2. There is a crescendo decrescendo systolic murmur. The abdomen is soft. There is no peripheral edema. There are no musculoskeletal deformities. There are no skin rashes.  EKG:   EKG is not done today.   Recent Labs: 04/01/2013: Hemoglobin 9.7*; Platelets 134* 01/28/2014: BUN 22; Creatinine 1.48*; Potassium 4.8; Sodium 137    Lipid Panel    Component Value Date/Time   CHOL 302* 01/19/2011 0600   TRIG 461* 01/19/2011 0600   HDL 45 01/19/2011 0600   CHOLHDL 6.7 01/19/2011 0600   VLDL UNABLE TO CALCULATE IF TRIGLYCERIDE OVER 400 mg/dL 01/19/2011 0600   LDLCALC UNABLE TO CALCULATE IF TRIGLYCERIDE OVER 400 mg/dL 01/19/2011 0600      Wt Readings from Last 3 Encounters:  03/09/14 138 lb (62.596 kg)  01/28/14 140 lb 9.6 oz (63.776 kg)  07/29/13 141 lb 1.9 oz (64.012 kg)      Current medicines are reviewed. The patient understands her medications.        ASSESSMENT AND PLAN:

## 2014-03-09 NOTE — Patient Instructions (Signed)
Your physician recommends that you schedule a follow-up appointment in: 6 months. You will receive a reminder letter in the mail in about 4 months reminding you to call and schedule your appointment. If you don't receive this letter, please contact our office. Your physician has recommended you make the following change in your medication:  Decrease your furosemide to 40 mg daily. Continue all other medications the same. Your physician recommends that you have lab work in 1 week on 03/16/14 to check your BMET. This is non-fasting lab work and can be done at [facility].

## 2014-03-09 NOTE — Assessment & Plan Note (Signed)
I'm watching her renal function carefully I lower her Lasix dose back from 60-40 mg today. She'll have a follow-up chemistry in one week.

## 2014-03-09 NOTE — Assessment & Plan Note (Signed)
The patient has mild to moderate aortic stenosis. This will be followed over time.

## 2014-03-16 DIAGNOSIS — R609 Edema, unspecified: Secondary | ICD-10-CM | POA: Diagnosis not present

## 2014-03-16 DIAGNOSIS — I4510 Unspecified right bundle-branch block: Secondary | ICD-10-CM | POA: Diagnosis not present

## 2014-03-21 ENCOUNTER — Telehealth: Payer: Self-pay | Admitting: *Deleted

## 2014-03-21 NOTE — Telephone Encounter (Signed)
-----   Message from Carlena Bjornstad, MD sent at 03/21/2014  7:54 AM EST ----- Please notify the patient that her lab is stable.

## 2014-03-21 NOTE — Telephone Encounter (Signed)
Patient informed. 

## 2014-03-29 DIAGNOSIS — Z794 Long term (current) use of insulin: Secondary | ICD-10-CM | POA: Diagnosis not present

## 2014-03-29 DIAGNOSIS — H3531 Nonexudative age-related macular degeneration: Secondary | ICD-10-CM | POA: Diagnosis not present

## 2014-03-29 DIAGNOSIS — E11329 Type 2 diabetes mellitus with mild nonproliferative diabetic retinopathy without macular edema: Secondary | ICD-10-CM | POA: Diagnosis not present

## 2014-03-29 DIAGNOSIS — H4011X4 Primary open-angle glaucoma, indeterminate stage: Secondary | ICD-10-CM | POA: Diagnosis not present

## 2014-04-27 ENCOUNTER — Other Ambulatory Visit: Payer: Self-pay | Admitting: Cardiology

## 2014-04-27 NOTE — Telephone Encounter (Signed)
HYDROcodone-acetaminophen (NORCO) 10-325 MG per tablet  Patient needs her refill.  Asked to leave message on her voicemail when she can pick it up.

## 2014-04-27 NOTE — Telephone Encounter (Signed)
Patient advised that she needed to call Dr. Rex Kras office for this request.

## 2014-04-28 ENCOUNTER — Other Ambulatory Visit: Payer: Self-pay | Admitting: Cardiology

## 2014-05-02 ENCOUNTER — Telehealth: Payer: Self-pay | Admitting: Cardiology

## 2014-05-02 NOTE — Telephone Encounter (Signed)
Patient's feet are swelling again and would like to know if she can increase her medication (Furosemide). Said that she was told by Dr Ron Parker to cut back to one pill a day and after she did her feet started swelling.

## 2014-05-03 MED ORDER — FUROSEMIDE 40 MG PO TABS: 60.0000 mg | ORAL_TABLET | Freq: Every day | ORAL | Status: DC

## 2014-05-03 NOTE — Telephone Encounter (Signed)
Spoke with patient and she is requesting to increase her furosemide back to 60 mg again since she is now having trouble with swelling in her feet again. Patient said that furosemide 60 mg daily helped this before. Patient informed that MD was out of the office and may not respond to message until next week. Patient advised that if her swelling gets worse to contact our office back for an appointment with one of the other providers, or she could contact her PCP. Patient said that she would rather wait for Dr. Ron Parker to respond. No c/o chest pain, sob or dizziness. Patient verbalized understanding of plan.

## 2014-05-03 NOTE — Telephone Encounter (Signed)
Patient informed. 

## 2014-05-03 NOTE — Telephone Encounter (Signed)
OK to increase lasix up to 60mg  daily.

## 2014-05-20 DIAGNOSIS — E1342 Other specified diabetes mellitus with diabetic polyneuropathy: Secondary | ICD-10-CM | POA: Diagnosis not present

## 2014-05-20 DIAGNOSIS — B351 Tinea unguium: Secondary | ICD-10-CM | POA: Diagnosis not present

## 2014-05-25 ENCOUNTER — Other Ambulatory Visit: Payer: Self-pay | Admitting: *Deleted

## 2014-05-25 MED ORDER — ISOSORBIDE MONONITRATE ER 60 MG PO TB24: 60.0000 mg | ORAL_TABLET | Freq: Every day | ORAL | Status: DC

## 2014-05-31 DIAGNOSIS — I10 Essential (primary) hypertension: Secondary | ICD-10-CM | POA: Diagnosis not present

## 2014-05-31 DIAGNOSIS — E1165 Type 2 diabetes mellitus with hyperglycemia: Secondary | ICD-10-CM | POA: Diagnosis not present

## 2014-06-13 ENCOUNTER — Encounter: Payer: Self-pay | Admitting: Cardiology

## 2014-06-13 ENCOUNTER — Ambulatory Visit (INDEPENDENT_AMBULATORY_CARE_PROVIDER_SITE_OTHER): Payer: Medicare Other | Admitting: Cardiology

## 2014-06-13 VITALS — BP 130/72 | HR 59 | Ht 64.0 in | Wt 137.0 lb

## 2014-06-13 DIAGNOSIS — R079 Chest pain, unspecified: Secondary | ICD-10-CM | POA: Diagnosis not present

## 2014-06-13 DIAGNOSIS — I2510 Atherosclerotic heart disease of native coronary artery without angina pectoris: Secondary | ICD-10-CM

## 2014-06-13 DIAGNOSIS — I5032 Chronic diastolic (congestive) heart failure: Secondary | ICD-10-CM | POA: Diagnosis not present

## 2014-06-13 MED ORDER — FUROSEMIDE 40 MG PO TABS: ORAL_TABLET | ORAL | Status: DC

## 2014-06-13 NOTE — Progress Notes (Signed)
Cardiology Office Note   Date:  06/13/2014   ID:  Sharon Hubbard, DOB February 27, 1933, MRN [id number]  PCP:  Neale Burly, MD  Cardiologist:  Dola Argyle, MD   Chief Complaint  Patient presents with  . Appointment    FOLLOW-UP CORONARY DISEASE AND EDEMA      History of Present Illness: Sharon Hubbard is a 81 y.o. female who presents today to follow up coronary disease and edema. She notes that she's had some increased edema recently. She's also had some exertional shortness of breath. She may have had mild PND on one or 2 occasions. She is very active. She is careful with her salt intake. By history she may be ingesting more fluid than we want.    Past Medical History  Diagnosis Date  . CAD (coronary artery disease)     Interventions in the past  . Groin hematoma     April, 2011  . Hypertension   . Diabetes mellitus   . Dyslipidemia   . GERD (gastroesophageal reflux disease)   . Schatzki's ring   . Renal insufficiency   . Polymyalgia rheumatica   . Osteoarthritis     arthroscopic surgery right knee February, 2012  . Spinal stenosis     history of surgery for this  . Ejection fraction     EF 65%, echo, April, 2012, aortic valve sclerosis with very slight gradient  . Preoperative evaluation to rule out surgical contraindication     Patient needs back surgery by Dr. Carloyn Manner, May, 2012                . Pancreas cyst     The patient has multiple pancreatic cysts.  This is followed at [facility]         . Complication of anesthesia     "felt like I was smothering once with mask on my face"  . RBBB (right bundle branch block)     old  . Bradycardia, sinus   . Myocardial infarction 2011; 08/2010  . Shortness of breath on exertion   . Blood transfusion   . Anemia   . Stroke 01/18/11    "I've had 2 TIA's"  . Cancer     ""had hysterectomy for a 6 showing cancer; think that's pretty strong  . Aortic stenosis     very mild, echo 12/2010  . Femoral bruit     01/2011  hosp, but no pseudoaneurysm or AV fistula    Past Surgical History  Procedure Laterality Date  . Cholecystectomy  1987  . Appendectomy    . Back surgery    . Lumbar spine surgery  ~ 1977; ~ 2010    "2 hugh ruptured discs; I was paralyzed first OR; 2nd OR by Dr. Carloyn Manner, don't know what for"  . Tubal ligation  1970's  . Dilation and curettage of uterus    . Abdominal hysterectomy  ~ 1974  . Knee arthroscopy  02/2010    right knee; chrondoplasty medial femoral condyle;  Partial medial meniscectomy.   . Coronary angioplasty with stent placement  2011    "in [place]"  . Coronary angioplasty with stent placement  08/2010; 03/25/2013  . Cardiac catheterization  01/18/11  . Femoral artery exploration Right 03/26/2013    Procedure: FEMORAL ARTERY EXPLORATION WITH SUTURE REPAIR; EVACUATION OF HEMATOMA;  Surgeon: Mal Misty, MD;  Location: [facility];  Service: Vascular;  Laterality: Right;  . Coronary angiogram  01/18/2011    Procedure: CORONARY ANGIOGRAM;  Surgeon: Larey Dresser, MD;  Location: [facility] CATH LAB;  Service: Cardiovascular;;  . Left and right heart catheterization with coronary angiogram N/A 03/25/2013    Procedure: LEFT AND RIGHT HEART CATHETERIZATION WITH CORONARY ANGIOGRAM;  Surgeon: Burnell Blanks, MD;  Location: [facility] CATH LAB;  Service: Cardiovascular;  Laterality: N/A;    Patient Active Problem List   Diagnosis Date Noted  . Femoral bruit     Priority: High  . CKD (chronic kidney disease) stage 3, GFR 30-59 ml/min 03/09/2014  . Itching 07/29/2013  . Edema 07/29/2013  . Femoral artery hematoma complicating cardiac catheterization 05/25/2013  . S/P evacuation of hematoma 04/13/2013  . Thrombocytopenia 03/29/2013  . Shortness of breath on exertion   . Aortic stenosis   . Preoperative evaluation to rule out surgical contraindication   . Pancreas cyst   . CAD (coronary artery disease)   . RBBB (right bundle branch block)   . Groin hematoma   . Hypertension   . Diabetes  mellitus   . Dyslipidemia   . GERD (gastroesophageal reflux disease)   . Renal insufficiency   . Polymyalgia rheumatica   . Bradycardia   . Ejection fraction       Current Outpatient Prescriptions  Medication Sig Dispense Refill  . amLODipine (NORVASC) 10 MG tablet Take 10 mg by mouth daily.      Marland Kitchen aspirin 81 MG tablet Take 81 mg by mouth daily.      . clopidogrel (PLAVIX) 75 MG tablet TAKE 1 TABLET BY MOUTH DAILY 90 tablet 3  . divalproex (DEPAKOTE) 500 MG 24 hr tablet Take 500 mg by mouth daily.      . furosemide (LASIX) 40 MG tablet Take 1.5 tablets (60 mg total) by mouth daily. Increase in dose 135 tablet 3  . glimepiride (AMARYL) 4 MG tablet Take 4 mg by mouth 2 (two) times daily.      Marland Kitchen HYDROcodone-acetaminophen (NORCO) 10-325 MG per tablet Take 1 tablet by mouth every 6 (six) hours as needed for pain.    Marland Kitchen insulin lispro (HUMALOG) 100 UNIT/ML injection Inject 0-10 Units into the skin 3 (three) times daily before meals. Pt is on a sliding scale    . isosorbide mononitrate (IMDUR) 60 MG 24 hr tablet Take 1 tablet (60 mg total) by mouth daily. 90 tablet 3  . latanoprost (XALATAN) 0.005 % ophthalmic solution Place 1 drop into both eyes at bedtime.      Marland Kitchen lisinopril (PRINIVIL,ZESTRIL) 20 MG tablet Take 20 mg by mouth daily.    . mirabegron ER (MYRBETRIQ) 25 MG TB24 tablet Take 25 mg by mouth daily.    . nitroGLYCERIN (NITROSTAT) 0.4 MG SL tablet Place 1 tablet (0.4 mg total) under the tongue every 5 (five) minutes as needed for chest pain. 30 tablet 1  . pantoprazole (PROTONIX) 40 MG tablet Take 40 mg by mouth daily.    . potassium chloride SA (K-DUR,KLOR-CON) 20 MEQ tablet Take 20 mEq by mouth daily.    . predniSONE (DELTASONE) 1 MG tablet Take 4 mg by mouth every morning.     . rosuvastatin (CRESTOR) 20 MG tablet Take 20 mg by mouth daily.    . timolol (TIMOPTIC) 0.5 % ophthalmic solution Place 1 drop into both eyes daily.      No current facility-administered medications for this  visit.    Allergies:   Clarithromycin; Codeine; Sulfa antibiotics; and Atorvastatin    Social History:  The patient  reports that she has never smoked.  She has never used smokeless tobacco. She reports that she does not drink alcohol or use illicit drugs.   Family History:  There is no significant family history of coronary disease.   ROS:  Please see the history of present illness.     Patient denies fever, chills, headache, sweats, rash, change in vision, change in hearing, chest pain, cough, nausea or vomiting, urinary symptoms. All other systems are reviewed and are negative.   PHYSICAL EXAM: VS:  BP 130/72 mmHg  Pulse 59  Ht 5\' 4"  (1.626 m)  Wt 137 lb (62.143 kg)  BMI 23.50 kg/m2  SpO2 96% , Patient is oriented to person time and place. Affect is normal. Head is atraumatic. Sclera and conjunctiva are normal.  There is no jugular venous distention. She does have a few rales at the right base posteriorly. There is no respiratory distress. Cardiac exam reveals an S1 and S2. There is a crescendo decrescendo systolic murmur.The abdomen is soft. She does have peripheral edema. There is pitting in the area at the top of her mid calf soft. There are no musculoskeletal deformities. There are no skin rashes. Neurologic is grossly intact.  EKG:   EKG is done today and reviewed by me. There is old incomplete right bundle branch block. There is no significant change from the past. There is sinus rhythm.   Recent Labs: 01/28/2014: BUN 22; Creatinine 1.48*; Potassium 4.8; Sodium 137    Lipid Panel    Component Value Date/Time   CHOL 302* 01/19/2011 0600   TRIG 461* 01/19/2011 0600   HDL 45 01/19/2011 0600   CHOLHDL 6.7 01/19/2011 0600   VLDL UNABLE TO CALCULATE IF TRIGLYCERIDE OVER 400 mg/dL 01/19/2011 0600   LDLCALC UNABLE TO CALCULATE IF TRIGLYCERIDE OVER 400 mg/dL 01/19/2011 0600      Wt Readings from Last 3 Encounters:  06/13/14 137 lb (62.143 kg)  03/09/14 138 lb (62.596 kg)    01/28/14 140 lb 9.6 oz (63.776 kg)      Current medicines are reviewed  Patient understands her medications.     ASSESSMENT AND PLAN:

## 2014-06-13 NOTE — Patient Instructions (Addendum)
   Increase Lasix to 40mg  twice a day  - new sent to pharmacy ([facility]) today. Continue all other medications.   Elevate feet & decrease fluid / water intake as directed by Dr. Ron Parker.  Next available follow up with Dr. Ron Parker.

## 2014-06-13 NOTE — Assessment & Plan Note (Signed)
It is possible that she may have some chest discomfort if she becomes short of breath from volume overload. I'm not convinced that she is having unstable angina at this time. Plan to continue to follow her coronary status.

## 2014-06-13 NOTE — Assessment & Plan Note (Signed)
The patient has been on 60 mg of Lasix. I believe she is mildly volume overloaded at this time. I will change her from 60 mg in the morning to 40 mg twice daily. She'll take the morning dose followed by an afternoon dose around 4 PM. I've instructed her to elevate her feet as much as she can. I have encouraged her to try to not wear socks or stockings that had a tight band at the top. She seems to not like the idea of trying support hose. She is careful in terms of watching her salt intake. She will cut back some of the excess fluid including water that she is drinking. We will see her for early follow-up. I suspect her aortic stenosis and her prednisone therapy playing a role in her volume retention.

## 2014-06-13 NOTE — Assessment & Plan Note (Signed)
The patient's last echo was January, 2016. It was most consistent with aortic stenosis that is moderate at the most. This could play some role with fluid retention. She does not need an echo at this time.

## 2014-07-05 ENCOUNTER — Encounter: Payer: Self-pay | Admitting: *Deleted

## 2014-07-06 DIAGNOSIS — M47816 Spondylosis without myelopathy or radiculopathy, lumbar region: Secondary | ICD-10-CM | POA: Diagnosis not present

## 2014-07-07 ENCOUNTER — Ambulatory Visit (INDEPENDENT_AMBULATORY_CARE_PROVIDER_SITE_OTHER): Payer: Medicare Other | Admitting: Cardiology

## 2014-07-07 ENCOUNTER — Encounter: Payer: Self-pay | Admitting: Cardiology

## 2014-07-07 VITALS — BP 122/64 | HR 58 | Ht 64.0 in | Wt 133.0 lb

## 2014-07-07 DIAGNOSIS — I5032 Chronic diastolic (congestive) heart failure: Secondary | ICD-10-CM

## 2014-07-07 DIAGNOSIS — N183 Chronic kidney disease, stage 3 unspecified: Secondary | ICD-10-CM

## 2014-07-07 DIAGNOSIS — I350 Nonrheumatic aortic (valve) stenosis: Secondary | ICD-10-CM

## 2014-07-07 DIAGNOSIS — I25118 Atherosclerotic heart disease of native coronary artery with other forms of angina pectoris: Secondary | ICD-10-CM

## 2014-07-07 MED ORDER — ISOSORBIDE MONONITRATE ER 60 MG PO TB24: 90.0000 mg | ORAL_TABLET | Freq: Every day | ORAL | Status: DC

## 2014-07-07 MED ORDER — LISINOPRIL 10 MG PO TABS: 10.0000 mg | ORAL_TABLET | Freq: Every day | ORAL | Status: DC

## 2014-07-07 NOTE — Assessment & Plan Note (Addendum)
Patient underwent drug-eluting stent to the LAD in March, 2015. She has severe stenosis of small caliber diagonal branch that was too small for PCI. She developed a significant groin hematoma requiring surgery with a prolonged recovery. I'm concerned about her current symptoms. She is waking up at night time with chest discomfort. However she is not having any exertional symptoms. I have strongly considered whether we should proceed with cardiac catheterization. However with past experience we need to be very careful. She is on multiple medications for coronary disease. I decided to lower her lisinopril dose and increase her isosorbide mononitrate. I will see her back for early follow-up. It is also possible that her polymyalgia may play a role with her chest discomfort. She has aortic stenosis that is moderate. I do not believe she has chest pain on this basis. We have to be careful with her medications to be sure that we do not drop her afterload too much. Also I will change her imdur from the morning till night time since her pain is at night. The patient agrees that she and her family want to do everything possible to not have to go through her peak catheterization.

## 2014-07-07 NOTE — Progress Notes (Signed)
Cardiology Office Note   Date:  07/07/2014   ID:  Sharon Hubbard, DOB July 10, 1933, MRN [id number]  PCP:  Neale Burly, MD  Cardiologist:  Dola Argyle, MD   Chief Complaint  Patient presents with  . Appointment    Follow-up coronary artery disease      History of Present Illness: Sharon Hubbard is a 81 y.o. female who presents today to follow-up coronary disease and shortness of breath. In addition the patient has polymyalgia rheumatica. I saw her last Jun 13, 2014. I felt she had some volume overload at that time. I increased her diuretics. Her weight is down a few pounds and her breathing is better. Her edema is decreased. However she continues to describe episodes of waking up in the middle of the night with chest discomfort. She has taken nitroglycerin on several occasions. She does have known coronary disease with intervention in the past. She is not having exertional chest pain.    Past Medical History  Diagnosis Date  . CAD (coronary artery disease)     Interventions in the past  . Groin hematoma     April, 2011  . Hypertension   . Diabetes mellitus   . Dyslipidemia   . GERD (gastroesophageal reflux disease)   . Schatzki's ring   . Renal insufficiency   . Polymyalgia rheumatica   . Osteoarthritis     arthroscopic surgery right knee February, 2012  . Spinal stenosis     history of surgery for this  . Ejection fraction     EF 65%, echo, April, 2012, aortic valve sclerosis with very slight gradient  . Preoperative evaluation to rule out surgical contraindication     Patient needs back surgery by Dr. Carloyn Manner, May, 2012                . Pancreas cyst     The patient has multiple pancreatic cysts.  This is followed at [facility]         . Complication of anesthesia     "felt like I was smothering once with mask on my face"  . RBBB (right bundle branch block)     old  . Bradycardia, sinus   . Myocardial infarction 2011; 08/2010  . Shortness of breath on  exertion   . Blood transfusion   . Anemia   . Stroke 01/18/11    "I've had 2 TIA's"  . Cancer     ""had hysterectomy for a 6 showing cancer; think that's pretty strong  . Aortic stenosis     very mild, echo 12/2010  . Femoral bruit     01/2011 hosp, but no pseudoaneurysm or AV fistula    Past Surgical History  Procedure Laterality Date  . Cholecystectomy  1987  . Appendectomy    . Back surgery    . Lumbar spine surgery  ~ 1977; ~ 2010    "2 hugh ruptured discs; I was paralyzed first OR; 2nd OR by Dr. Carloyn Manner, don't know what for"  . Tubal ligation  1970's  . Dilation and curettage of uterus    . Abdominal hysterectomy  ~ 1974  . Knee arthroscopy  02/2010    right knee; chrondoplasty medial femoral condyle;  Partial medial meniscectomy.   . Coronary angioplasty with stent placement  2011    "in [place]"  . Coronary angioplasty with stent placement  08/2010; 03/25/2013  . Cardiac catheterization  01/18/11  . Femoral artery exploration Right 03/26/2013    Procedure:  FEMORAL ARTERY EXPLORATION WITH SUTURE REPAIR; EVACUATION OF HEMATOMA;  Surgeon: Mal Misty, MD;  Location: [place];  Service: Vascular;  Laterality: Right;  . Coronary angiogram  01/18/2011    Procedure: CORONARY ANGIOGRAM;  Surgeon: Larey Dresser, MD;  Location: [facility] CATH LAB;  Service: Cardiovascular;;  . Left and right heart catheterization with coronary angiogram N/A 03/25/2013    Procedure: LEFT AND RIGHT HEART CATHETERIZATION WITH CORONARY ANGIOGRAM;  Surgeon: Burnell Blanks, MD;  Location: [facility] CATH LAB;  Service: Cardiovascular;  Laterality: N/A;    Patient Active Problem List   Diagnosis Date Noted  . Femoral bruit     Priority: High  . Chronic diastolic CHF (congestive heart failure) 06/13/2014  . CKD (chronic kidney disease) stage 3, GFR 30-59 ml/min 03/09/2014  . Itching 07/29/2013  . Edema 07/29/2013  . Femoral artery hematoma complicating cardiac catheterization 05/25/2013  . S/P evacuation of  hematoma 04/13/2013  . Thrombocytopenia 03/29/2013  . Shortness of breath on exertion   . Aortic stenosis   . Preoperative evaluation to rule out surgical contraindication   . Pancreas cyst   . CAD (coronary artery disease)   . RBBB (right bundle branch block)   . Groin hematoma   . Hypertension   . Diabetes mellitus   . Dyslipidemia   . GERD (gastroesophageal reflux disease)   . Renal insufficiency   . Polymyalgia rheumatica   . Bradycardia   . Ejection fraction       Current Outpatient Prescriptions  Medication Sig Dispense Refill  . amLODipine (NORVASC) 10 MG tablet Take 10 mg by mouth daily.      Marland Kitchen aspirin 81 MG tablet Take 81 mg by mouth daily.      . clopidogrel (PLAVIX) 75 MG tablet TAKE 1 TABLET BY MOUTH DAILY 90 tablet 3  . divalproex (DEPAKOTE) 500 MG 24 hr tablet Take 500 mg by mouth daily.      . furosemide (LASIX) 40 MG tablet Take one tab by mouth every morning & one tab every evening around 4 pm. 180 tablet 3  . glimepiride (AMARYL) 4 MG tablet Take 4 mg by mouth 2 (two) times daily.      Marland Kitchen HYDROcodone-acetaminophen (NORCO) 10-325 MG per tablet Take 1 tablet by mouth every 6 (six) hours as needed for pain.    Marland Kitchen insulin lispro (HUMALOG) 100 UNIT/ML injection Inject 0-10 Units into the skin 3 (three) times daily before meals. Pt is on a sliding scale    . isosorbide mononitrate (IMDUR) 60 MG 24 hr tablet Take 1 tablet (60 mg total) by mouth daily. 90 tablet 3  . latanoprost (XALATAN) 0.005 % ophthalmic solution Place 1 drop into both eyes at bedtime.      Marland Kitchen lisinopril (PRINIVIL,ZESTRIL) 20 MG tablet Take 20 mg by mouth daily.    . mirabegron ER (MYRBETRIQ) 25 MG TB24 tablet Take 25 mg by mouth daily.    . nitroGLYCERIN (NITROSTAT) 0.4 MG SL tablet Place 1 tablet (0.4 mg total) under the tongue every 5 (five) minutes as needed for chest pain. 30 tablet 1  . pantoprazole (PROTONIX) 40 MG tablet Take 40 mg by mouth daily.    . potassium chloride SA (K-DUR,KLOR-CON) 20  MEQ tablet Take 20 mEq by mouth daily.    . predniSONE (DELTASONE) 1 MG tablet Take 4 mg by mouth every morning.     . rosuvastatin (CRESTOR) 20 MG tablet Take 20 mg by mouth daily.    . timolol (TIMOPTIC)  0.5 % ophthalmic solution Place 1 drop into both eyes daily.      No current facility-administered medications for this visit.    Allergies:   Clarithromycin; Codeine; Sulfa antibiotics; and Atorvastatin    Social History:  The patient  reports that she has never smoked. She has never used smokeless tobacco. She reports that she does not drink alcohol or use illicit drugs.   Family History:  The patient's family history includes Other (age of onset: 70) in her father; Uterine cancer (age of onset: 71) in her mother.    ROS:  Please see the history of present illness.   Patient denies fever, chills, headache, sweats, rash, change in vision, change in hearing, cough, nausea or vomiting, urinary symptoms. All other systems are reviewed and are negative.     PHYSICAL EXAM: VS:  BP 122/64 mmHg  Pulse 58  Ht 5\' 4"  (1.626 m)  Wt 133 lb (60.328 kg)  BMI 22.82 kg/m2  SpO2 98% , Patient is stable today. She is oriented to person time and place. Affect is normal. Head is atraumatic. Sclera and conjunctiva are normal. There is no jugular venous distention. Lungs are clear. Respiratory effort is not labored. Cardiac exam reveals S1 and S2. There is a murmur of aortic stenosis. The abdomen is soft. There is only trace peripheral edema. There are no musculoskeletal deformities. There are no skin rashes.   EKG:   EKG is not done today.  Recent Labs: 01/28/2014: BUN 22; Creat 1.48*; Potassium 4.8; Sodium 137    Lipid Panel    Component Value Date/Time   CHOL 302* 01/19/2011 0600   TRIG 461* 01/19/2011 0600   HDL 45 01/19/2011 0600   CHOLHDL 6.7 01/19/2011 0600   VLDL UNABLE TO CALCULATE IF TRIGLYCERIDE OVER 400 mg/dL 01/19/2011 0600   LDLCALC UNABLE TO CALCULATE IF TRIGLYCERIDE OVER 400  mg/dL 01/19/2011 0600      Wt Readings from Last 3 Encounters:  07/07/14 133 lb (60.328 kg)  06/13/14 137 lb (62.143 kg)  03/09/14 138 lb (62.596 kg)      Current medicines are reviewed  The patient understands her medications.     ASSESSMENT AND PLAN:

## 2014-07-07 NOTE — Assessment & Plan Note (Signed)
Aortic stenosis was moderate in January, 2016. No repeat echo is warranted at this time.

## 2014-07-07 NOTE — Patient Instructions (Signed)
Your physician has recommended you make the following change in your medication:  Decrease your lisinopril to 10 mg daily. You may break your 20 mg tablet in half daily until they are finished. Increase your isosorbide mononitrate (imdur) to 90 mg daily at bedtime instead of in the mornings. Please take 1&1/2 daily of your 60 mg tablets. Continue all other medications the same. Your physician recommends that you schedule a follow-up appointment on 07/20/14 with Dr. Ron Parker.

## 2014-07-07 NOTE — Assessment & Plan Note (Signed)
We need to be very careful with her renal function if we decide to proceed with repeat catheterization.

## 2014-07-07 NOTE — Assessment & Plan Note (Signed)
The patient has repeatedly had groin hematomas after cath in the past. This occurred in April, 2011 and March, 2015.

## 2014-07-07 NOTE — Assessment & Plan Note (Signed)
On a higher dose of diuretics for edema is improved. We will continue this dose and check her chemistry today.

## 2014-07-11 DIAGNOSIS — K5900 Constipation, unspecified: Secondary | ICD-10-CM | POA: Diagnosis not present

## 2014-07-11 DIAGNOSIS — R3915 Urgency of urination: Secondary | ICD-10-CM | POA: Diagnosis not present

## 2014-07-18 ENCOUNTER — Other Ambulatory Visit: Payer: Self-pay | Admitting: *Deleted

## 2014-07-18 MED ORDER — CLOPIDOGREL BISULFATE 75 MG PO TABS: 75.0000 mg | ORAL_TABLET | Freq: Every day | ORAL | Status: DC

## 2014-07-20 ENCOUNTER — Encounter: Payer: Self-pay | Admitting: Cardiology

## 2014-07-20 ENCOUNTER — Ambulatory Visit (INDEPENDENT_AMBULATORY_CARE_PROVIDER_SITE_OTHER): Payer: Medicare Other | Admitting: Cardiology

## 2014-07-20 VITALS — BP 144/78 | HR 68 | Ht 64.0 in | Wt 138.0 lb

## 2014-07-20 DIAGNOSIS — I5032 Chronic diastolic (congestive) heart failure: Secondary | ICD-10-CM | POA: Diagnosis not present

## 2014-07-20 DIAGNOSIS — I25118 Atherosclerotic heart disease of native coronary artery with other forms of angina pectoris: Secondary | ICD-10-CM

## 2014-07-20 NOTE — Assessment & Plan Note (Signed)
She's having some mild chest discomfort. However she has significant stress. I feel it is most prudent to follow her medically. No change in therapy today.

## 2014-07-20 NOTE — Progress Notes (Signed)
Cardiology Office Note   Date:  07/20/2014   ID:  Sharon Hubbard, DOB [birth year], MRN [id number]  PCP:  Neale Burly, MD  Cardiologist:  Dola Argyle, MD   Chief Complaint  Patient presents with  . Appointment    Follow-up coronary artery disease      History of Present Illness: Sharon Hubbard is a 81 y.o. female who presents today to follow up coronary disease. I saw her on July 07, 2014. She is having some chest discomfort. 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 We adjusted her Imdur to be taken at night time. I believe this helps.    Past Medical History  Diagnosis Date  . CAD (coronary artery disease)     Interventions in the past  . Groin hematoma     April, 2011  . Hypertension   . Diabetes mellitus   . Dyslipidemia   . GERD (gastroesophageal reflux disease)   . Schatzki's ring   . Renal insufficiency   . Polymyalgia rheumatica   . Osteoarthritis     arthroscopic surgery right knee February, 2012  . Spinal stenosis     history of surgery for this  . Ejection fraction     EF 65%, echo, April, 2012, aortic valve sclerosis with very slight gradient  . Preoperative evaluation to rule out surgical contraindication     Patient needs back surgery by Dr. Carloyn Manner, May, 2012                . Pancreas cyst     The patient has multiple pancreatic cysts.  This is followed at [facility]         . Complication of anesthesia     "felt like I was smothering once with mask on my face"  . RBBB (right bundle branch block)     old  . Bradycardia, sinus   . Myocardial infarction 2011; 08/2010  . Shortness of breath on exertion   . Blood transfusion   . Anemia   . Stroke 01/18/11    "I've had 2 TIA's"  . Cancer     ""had hysterectomy for a 6 showing cancer; think that's pretty strong  . Aortic stenosis     very mild, echo 12/2010  . Femoral bruit     01/2011 hosp, but no pseudoaneurysm or AV fistula    Past Surgical History    Procedure Laterality Date  . Cholecystectomy  1987  . Appendectomy    . Back surgery    . Lumbar spine surgery  ~ 1977; ~ 2010    "2 hugh ruptured discs; I was paralyzed first OR; 2nd OR by Dr. Carloyn Manner, don't know what for"  . Tubal ligation  1970's  . Dilation and curettage of uterus    . Abdominal hysterectomy  ~ 1974  . Knee arthroscopy  02/2010    right knee; chrondoplasty medial femoral condyle;  Partial medial meniscectomy.   . Coronary angioplasty with stent placement  2011    "in [place]"  . Coronary angioplasty with stent placement  08/2010; 03/25/2013  . Cardiac catheterization  01/18/11  . Femoral artery exploration Right 03/26/2013    Procedure: FEMORAL ARTERY EXPLORATION WITH SUTURE REPAIR; EVACUATION OF HEMATOMA;  Surgeon: Mal Misty, MD;  Location: [place];  Service: Vascular;  Laterality: Right;  . Coronary angiogram  01/18/2011    Procedure: CORONARY ANGIOGRAM;  Surgeon: Larey Dresser, MD;  Location: [facility] CATH LAB;  Service:  Cardiovascular;;  . Left and right heart catheterization with coronary angiogram N/A 03/25/2013    Procedure: LEFT AND RIGHT HEART CATHETERIZATION WITH CORONARY ANGIOGRAM;  Surgeon: Burnell Blanks, MD;  Location: [facility] CATH LAB;  Service: Cardiovascular;  Laterality: N/A;    Patient Active Problem List   Diagnosis Date Noted  . Femoral bruit     Priority: High  . Chronic diastolic CHF (congestive heart failure) 06/13/2014  . CKD (chronic kidney disease) stage 3, GFR 30-59 ml/min 03/09/2014  . Itching 07/29/2013  . Edema 07/29/2013  . Femoral artery hematoma complicating cardiac catheterization 05/25/2013  . S/P evacuation of hematoma 04/13/2013  . Thrombocytopenia 03/29/2013  . Shortness of breath on exertion   . Aortic stenosis   . Preoperative evaluation to rule out surgical contraindication   . Pancreas cyst   . CAD (coronary artery disease)   . RBBB (right bundle branch block)   . Groin hematoma   . Hypertension   . Diabetes mellitus    . Dyslipidemia   . GERD (gastroesophageal reflux disease)   . Polymyalgia rheumatica   . Bradycardia   . Ejection fraction       Current Outpatient Prescriptions  Medication Sig Dispense Refill  . amLODipine (NORVASC) 10 MG tablet Take 10 mg by mouth daily.      Marland Kitchen aspirin 81 MG tablet Take 81 mg by mouth daily.      . clopidogrel (PLAVIX) 75 MG tablet Take 1 tablet (75 mg total) by mouth daily. 90 tablet 3  . divalproex (DEPAKOTE) 500 MG 24 hr tablet Take 500 mg by mouth daily.      . furosemide (LASIX) 40 MG tablet Take one tab by mouth every morning & one tab every evening around 4 pm. 180 tablet 3  . glimepiride (AMARYL) 4 MG tablet Take 4 mg by mouth 2 (two) times daily.      Marland Kitchen HYDROcodone-acetaminophen (NORCO) 10-325 MG per tablet Take 1 tablet by mouth every 6 (six) hours as needed for pain.    Marland Kitchen insulin lispro (HUMALOG) 100 UNIT/ML injection Inject 0-10 Units into the skin 3 (three) times daily before meals. Pt is on a sliding scale    . isosorbide mononitrate (IMDUR) 60 MG 24 hr tablet Take 1.5 tablets (90 mg total) by mouth at bedtime. 135 tablet 3  . latanoprost (XALATAN) 0.005 % ophthalmic solution Place 1 drop into both eyes at bedtime.      Marland Kitchen lisinopril (PRINIVIL,ZESTRIL) 10 MG tablet Take 1 tablet (10 mg total) by mouth daily. 90 tablet 3  . mirabegron ER (MYRBETRIQ) 25 MG TB24 tablet Take 25 mg by mouth daily.    . nitroGLYCERIN (NITROSTAT) 0.4 MG SL tablet Place 1 tablet (0.4 mg total) under the tongue every 5 (five) minutes as needed for chest pain. 30 tablet 1  . pantoprazole (PROTONIX) 40 MG tablet Take 40 mg by mouth daily.    . potassium chloride SA (K-DUR,KLOR-CON) 20 MEQ tablet Take 20 mEq by mouth daily.    . predniSONE (DELTASONE) 1 MG tablet Take 4 mg by mouth every morning.     . rosuvastatin (CRESTOR) 20 MG tablet Take 20 mg by mouth daily.    . timolol (TIMOPTIC) 0.5 % ophthalmic solution Place 1 drop into both eyes daily.      No current  facility-administered medications for this visit.    Allergies:   Clarithromycin; Codeine; Sulfa antibiotics; and Atorvastatin    Social History:  The patient  reports that she  has never smoked. She has never used smokeless tobacco. She reports that she does not drink alcohol or use illicit drugs.   Family History:  The patient's  family history includes Other (age of onset: 82) in her father; Uterine cancer (age of onset: 39) in her mother.    ROS:  Please see the history of present illness.     Patient denies fever, chills, headache, sweats, rash, change in vision, change in hearing, cough, nausea or vomiting, urinary symptoms. All other systems are reviewed and are negative.   PHYSICAL EXAM: VS:  BP 144/78 mmHg  Pulse 68  Ht 5\' 4"  (1.626 m)  Wt 138 lb (62.596 kg)  BMI 23.68 kg/m2  SpO2 98% , patient is oriented to person time and place. Affect is normal. Head is atraumatic. Sclera and conjunctiva are normal. There is no jugular venous distention. Lungs are clear. Respiratory effort is nonlabored. Cardiac exam reveals S1 and S2. Abdomen is soft. There is no peripheral edema. There are no musculoskeletal deformities. There are no skin rashes.  EKG:   EKG is not done today.   Recent Labs: 01/28/2014: BUN 22; Creat 1.48*; Potassium 4.8; Sodium 137    Lipid Panel    Component Value Date/Time   CHOL 302* 01/19/2011 0600   TRIG 461* 01/19/2011 0600   HDL 45 01/19/2011 0600   CHOLHDL 6.7 01/19/2011 0600   VLDL UNABLE TO CALCULATE IF TRIGLYCERIDE OVER 400 mg/dL 01/19/2011 0600   LDLCALC UNABLE TO CALCULATE IF TRIGLYCERIDE OVER 400 mg/dL 01/19/2011 0600      Wt Readings from Last 3 Encounters:  07/20/14 138 lb (62.596 kg)  07/07/14 133 lb (60.328 kg)  06/13/14 137 lb (62.143 kg)      Current medicines are reviewed  The patient understands her medications.     ASSESSMENT AND PLAN:

## 2014-07-20 NOTE — Patient Instructions (Signed)
Medication Instructions:  Your physician recommends that you continue on your current medications as directed. Please refer to the Current Medication list given to you today.   Labwork: NONE  Testing/Procedures: NONE  Follow-Up: 10/10/14 WITH DR. KATZ  Any Other Special Instructions Will Be Listed Below (If Applicable).

## 2014-07-20 NOTE — Assessment & Plan Note (Signed)
Volume status is stable. No change in therapy. 

## 2014-07-29 DIAGNOSIS — E1342 Other specified diabetes mellitus with diabetic polyneuropathy: Secondary | ICD-10-CM | POA: Diagnosis not present

## 2014-07-29 DIAGNOSIS — B351 Tinea unguium: Secondary | ICD-10-CM | POA: Diagnosis not present

## 2014-08-15 ENCOUNTER — Inpatient Hospital Stay (HOSPITAL_COMMUNITY)
Admission: AD | Admit: 2014-08-15 | Discharge: 2014-08-29 | DRG: 217 | Disposition: A | Discharge status: Skilled Nursing Facility | Payer: Medicare Other | Source: Other Acute Inpatient Hospital | Attending: Thoracic Surgery (Cardiothoracic Vascular Surgery) | Admitting: Thoracic Surgery (Cardiothoracic Vascular Surgery)

## 2014-08-15 ENCOUNTER — Encounter (HOSPITAL_COMMUNITY): Payer: Self-pay | Admitting: *Deleted

## 2014-08-15 DIAGNOSIS — Z8673 Personal history of transient ischemic attack (TIA), and cerebral infarction without residual deficits: Secondary | ICD-10-CM | POA: Diagnosis not present

## 2014-08-15 DIAGNOSIS — I501 Left ventricular failure: Secondary | ICD-10-CM | POA: Diagnosis not present

## 2014-08-15 DIAGNOSIS — Z955 Presence of coronary angioplasty implant and graft: Secondary | ICD-10-CM | POA: Diagnosis not present

## 2014-08-15 DIAGNOSIS — I214 Non-ST elevation (NSTEMI) myocardial infarction: Secondary | ICD-10-CM | POA: Diagnosis not present

## 2014-08-15 DIAGNOSIS — Z7902 Long term (current) use of antithrombotics/antiplatelets: Secondary | ICD-10-CM

## 2014-08-15 DIAGNOSIS — Z952 Presence of prosthetic heart valve: Secondary | ICD-10-CM

## 2014-08-15 DIAGNOSIS — Z794 Long term (current) use of insulin: Secondary | ICD-10-CM

## 2014-08-15 DIAGNOSIS — E1122 Type 2 diabetes mellitus with diabetic chronic kidney disease: Secondary | ICD-10-CM | POA: Diagnosis present

## 2014-08-15 DIAGNOSIS — I209 Angina pectoris, unspecified: Secondary | ICD-10-CM | POA: Diagnosis not present

## 2014-08-15 DIAGNOSIS — T380X5A Adverse effect of glucocorticoids and synthetic analogues, initial encounter: Secondary | ICD-10-CM | POA: Diagnosis present

## 2014-08-15 DIAGNOSIS — M353 Polymyalgia rheumatica: Secondary | ICD-10-CM | POA: Diagnosis present

## 2014-08-15 DIAGNOSIS — H4011X3 Primary open-angle glaucoma, severe stage: Secondary | ICD-10-CM | POA: Diagnosis not present

## 2014-08-15 DIAGNOSIS — J90 Pleural effusion, not elsewhere classified: Secondary | ICD-10-CM | POA: Diagnosis not present

## 2014-08-15 DIAGNOSIS — M1990 Unspecified osteoarthritis, unspecified site: Secondary | ICD-10-CM | POA: Diagnosis present

## 2014-08-15 DIAGNOSIS — Z7952 Long term (current) use of systemic steroids: Secondary | ICD-10-CM | POA: Diagnosis not present

## 2014-08-15 DIAGNOSIS — IMO0002 Reserved for concepts with insufficient information to code with codable children: Secondary | ICD-10-CM

## 2014-08-15 DIAGNOSIS — Q238 Other congenital malformations of aortic and mitral valves: Secondary | ICD-10-CM | POA: Diagnosis not present

## 2014-08-15 DIAGNOSIS — I25110 Atherosclerotic heart disease of native coronary artery with unstable angina pectoris: Principal | ICD-10-CM | POA: Diagnosis present

## 2014-08-15 DIAGNOSIS — I358 Other nonrheumatic aortic valve disorders: Secondary | ICD-10-CM | POA: Diagnosis not present

## 2014-08-15 DIAGNOSIS — I200 Unstable angina: Secondary | ICD-10-CM | POA: Diagnosis not present

## 2014-08-15 DIAGNOSIS — I5032 Chronic diastolic (congestive) heart failure: Secondary | ICD-10-CM | POA: Diagnosis present

## 2014-08-15 DIAGNOSIS — E785 Hyperlipidemia, unspecified: Secondary | ICD-10-CM | POA: Diagnosis present

## 2014-08-15 DIAGNOSIS — Z79899 Other long term (current) drug therapy: Secondary | ICD-10-CM | POA: Diagnosis not present

## 2014-08-15 DIAGNOSIS — R001 Bradycardia, unspecified: Secondary | ICD-10-CM | POA: Diagnosis present

## 2014-08-15 DIAGNOSIS — I252 Old myocardial infarction: Secondary | ICD-10-CM

## 2014-08-15 DIAGNOSIS — I2510 Atherosclerotic heart disease of native coronary artery without angina pectoris: Secondary | ICD-10-CM | POA: Diagnosis not present

## 2014-08-15 DIAGNOSIS — E1165 Type 2 diabetes mellitus with hyperglycemia: Secondary | ICD-10-CM | POA: Diagnosis present

## 2014-08-15 DIAGNOSIS — N183 Chronic kidney disease, stage 3 unspecified: Secondary | ICD-10-CM | POA: Diagnosis present

## 2014-08-15 DIAGNOSIS — R0602 Shortness of breath: Secondary | ICD-10-CM | POA: Diagnosis not present

## 2014-08-15 DIAGNOSIS — Z7982 Long term (current) use of aspirin: Secondary | ICD-10-CM | POA: Diagnosis not present

## 2014-08-15 DIAGNOSIS — R2689 Other abnormalities of gait and mobility: Secondary | ICD-10-CM | POA: Diagnosis not present

## 2014-08-15 DIAGNOSIS — D62 Acute posthemorrhagic anemia: Secondary | ICD-10-CM | POA: Diagnosis not present

## 2014-08-15 DIAGNOSIS — M6281 Muscle weakness (generalized): Secondary | ICD-10-CM | POA: Diagnosis not present

## 2014-08-15 DIAGNOSIS — I5022 Chronic systolic (congestive) heart failure: Secondary | ICD-10-CM | POA: Diagnosis not present

## 2014-08-15 DIAGNOSIS — R079 Chest pain, unspecified: Secondary | ICD-10-CM | POA: Diagnosis not present

## 2014-08-15 DIAGNOSIS — I350 Nonrheumatic aortic (valve) stenosis: Secondary | ICD-10-CM

## 2014-08-15 DIAGNOSIS — I129 Hypertensive chronic kidney disease with stage 1 through stage 4 chronic kidney disease, or unspecified chronic kidney disease: Secondary | ICD-10-CM | POA: Diagnosis present

## 2014-08-15 DIAGNOSIS — K219 Gastro-esophageal reflux disease without esophagitis: Secondary | ICD-10-CM | POA: Diagnosis present

## 2014-08-15 DIAGNOSIS — R7989 Other specified abnormal findings of blood chemistry: Secondary | ICD-10-CM | POA: Diagnosis not present

## 2014-08-15 DIAGNOSIS — Z951 Presence of aortocoronary bypass graft: Secondary | ICD-10-CM | POA: Diagnosis not present

## 2014-08-15 DIAGNOSIS — H4011X2 Primary open-angle glaucoma, moderate stage: Secondary | ICD-10-CM | POA: Diagnosis not present

## 2014-08-15 DIAGNOSIS — I4510 Unspecified right bundle-branch block: Secondary | ICD-10-CM | POA: Diagnosis present

## 2014-08-15 DIAGNOSIS — D696 Thrombocytopenia, unspecified: Secondary | ICD-10-CM | POA: Diagnosis present

## 2014-08-15 DIAGNOSIS — I25119 Atherosclerotic heart disease of native coronary artery with unspecified angina pectoris: Secondary | ICD-10-CM | POA: Diagnosis not present

## 2014-08-15 DIAGNOSIS — J9811 Atelectasis: Secondary | ICD-10-CM | POA: Diagnosis not present

## 2014-08-15 DIAGNOSIS — D649 Anemia, unspecified: Secondary | ICD-10-CM | POA: Diagnosis not present

## 2014-08-15 DIAGNOSIS — E119 Type 2 diabetes mellitus without complications: Secondary | ICD-10-CM | POA: Diagnosis not present

## 2014-08-15 DIAGNOSIS — I10 Essential (primary) hypertension: Secondary | ICD-10-CM | POA: Diagnosis not present

## 2014-08-15 LAB — GLUCOSE, CAPILLARY: Glucose-Capillary: 125 mg/dL — ABNORMAL HIGH (ref 65–99)

## 2014-08-15 MED ORDER — ASPIRIN 300 MG RE SUPP: 300.0000 mg | RECTAL | Status: DC

## 2014-08-15 MED ORDER — TIMOLOL MALEATE 0.5 % OP SOLN
1.0000 [drp] | Freq: Every day | OPHTHALMIC | Status: DC
Start: 2014-08-16 — End: 2014-08-29
  Administered 2014-08-16 – 2014-08-29 (×12): 1 [drp] via OPHTHALMIC
  Filled 2014-08-15 (×3): qty 5

## 2014-08-15 MED ORDER — DIVALPROEX SODIUM ER 500 MG PO TB24
500.0000 mg | ORAL_TABLET | Freq: Every day | ORAL | Status: DC
  Administered 2014-08-16 – 2014-08-29 (×13): 500 mg via ORAL
  Filled 2014-08-15 (×14): qty 1

## 2014-08-15 MED ORDER — PANTOPRAZOLE SODIUM 40 MG PO TBEC
40.0000 mg | DELAYED_RELEASE_TABLET | Freq: Every day | ORAL | Status: DC
  Administered 2014-08-16 – 2014-08-21 (×6): 40 mg via ORAL
  Filled 2014-08-15 (×6): qty 1

## 2014-08-15 MED ORDER — ASPIRIN 81 MG PO TABS: 81.0000 mg | ORAL_TABLET | Freq: Every day | ORAL | Status: DC

## 2014-08-15 MED ORDER — ASPIRIN EC 81 MG PO TBEC
81.0000 mg | DELAYED_RELEASE_TABLET | Freq: Every day | ORAL | Status: DC
  Administered 2014-08-16 – 2014-08-18 (×3): 81 mg via ORAL
  Filled 2014-08-15 (×3): qty 1

## 2014-08-15 MED ORDER — PREDNISONE 1 MG PO TABS
4.0000 mg | ORAL_TABLET | Freq: Every day | ORAL | Status: DC
  Administered 2014-08-16 – 2014-08-21 (×6): 4 mg via ORAL
  Filled 2014-08-15 (×9): qty 4

## 2014-08-15 MED ORDER — METOPROLOL TARTRATE 12.5 MG HALF TABLET
12.5000 mg | ORAL_TABLET | Freq: Two times a day (BID) | ORAL | Status: DC
  Administered 2014-08-16 – 2014-08-17 (×5): 12.5 mg via ORAL
  Filled 2014-08-15 (×5): qty 1

## 2014-08-15 MED ORDER — NITROGLYCERIN 0.4 MG SL SUBL
0.4000 mg | SUBLINGUAL_TABLET | SUBLINGUAL | Status: DC | PRN
  Administered 2014-08-17 – 2014-08-21 (×4): 0.4 mg via SUBLINGUAL
  Filled 2014-08-15 (×2): qty 1

## 2014-08-15 MED ORDER — HYDROCODONE-ACETAMINOPHEN 10-325 MG PO TABS
1.0000 | ORAL_TABLET | Freq: Four times a day (QID) | ORAL | Status: DC | PRN
  Administered 2014-08-21: 1 via ORAL
  Filled 2014-08-15: qty 1

## 2014-08-15 MED ORDER — METOPROLOL TARTRATE 25 MG PO TABS: 25.0000 mg | ORAL_TABLET | Freq: Two times a day (BID) | ORAL | Status: DC

## 2014-08-15 MED ORDER — NITROGLYCERIN 0.4 MG SL SUBL: 0.4000 mg | SUBLINGUAL_TABLET | SUBLINGUAL | Status: DC | PRN

## 2014-08-15 MED ORDER — AMLODIPINE BESYLATE 10 MG PO TABS: 10.0000 mg | ORAL_TABLET | Freq: Every day | ORAL | Status: DC

## 2014-08-15 MED ORDER — LATANOPROST 0.005 % OP SOLN
1.0000 [drp] | Freq: Every day | OPHTHALMIC | Status: DC
  Administered 2014-08-16 – 2014-08-28 (×13): 1 [drp] via OPHTHALMIC
  Filled 2014-08-15 (×5): qty 2.5

## 2014-08-15 MED ORDER — ISOSORBIDE MONONITRATE ER 60 MG PO TB24
90.0000 mg | ORAL_TABLET | Freq: Every day | ORAL | Status: DC
  Administered 2014-08-16 – 2014-08-21 (×7): 90 mg via ORAL
  Filled 2014-08-15 (×13): qty 1

## 2014-08-15 MED ORDER — SODIUM CHLORIDE 0.9 % IV SOLN
INTRAVENOUS | Status: DC
  Administered 2014-08-16: 01:00:00 via INTRAVENOUS

## 2014-08-15 MED ORDER — ASPIRIN 81 MG PO CHEW: 324.0000 mg | CHEWABLE_TABLET | ORAL | Status: DC

## 2014-08-15 MED ORDER — CLOPIDOGREL BISULFATE 75 MG PO TABS
75.0000 mg | ORAL_TABLET | Freq: Every day | ORAL | Status: DC
  Administered 2014-08-16 – 2014-08-17 (×2): 75 mg via ORAL
  Filled 2014-08-15 (×2): qty 1

## 2014-08-15 MED ORDER — ONDANSETRON HCL 4 MG/2ML IJ SOLN: 4.0000 mg | Freq: Four times a day (QID) | INTRAMUSCULAR | Status: DC | PRN

## 2014-08-15 MED ORDER — ACETAMINOPHEN 325 MG PO TABS
650.0000 mg | ORAL_TABLET | ORAL | Status: DC | PRN
  Administered 2014-08-19 – 2014-08-21 (×5): 650 mg via ORAL
  Filled 2014-08-15 (×4): qty 2

## 2014-08-15 MED ORDER — MIRABEGRON ER 25 MG PO TB24
25.0000 mg | ORAL_TABLET | Freq: Every day | ORAL | Status: DC
  Administered 2014-08-16 – 2014-08-29 (×13): 25 mg via ORAL
  Filled 2014-08-15 (×14): qty 1

## 2014-08-15 MED ORDER — ROSUVASTATIN CALCIUM 20 MG PO TABS
20.0000 mg | ORAL_TABLET | Freq: Every day | ORAL | Status: DC
  Administered 2014-08-16 – 2014-08-29 (×13): 20 mg via ORAL
  Filled 2014-08-15: qty 2
  Filled 2014-08-15 (×4): qty 1
  Filled 2014-08-15: qty 2
  Filled 2014-08-15 (×9): qty 1

## 2014-08-15 NOTE — H&P (Signed)
Physician History and Physical    Sharon Hubbard MRN: [id number] DOB/AGE: [birth year] 81 y.o. Admit date: 08/15/2014  Primary Care Physician: Dr Sherrie Sport Primary Cardiologist: Dr Ron Parker  HPI: 81 yo with history of CAD s/p multiple stents (most recently DES to LAD in 0/97), diastolic CHF, moderate aortic stenosis, CKD, diabetes presents with symptoms concerning for unstable angina.  For the last couple of months, she has been having episodes of left-sided chest pain radiating to the left arm about 2-3 times a week (gradually becoming more frequent).  The pain is not triggered necessarily by exertion and has typically resolved in a few minutes.  It would also go away with 1-2 NTGs.  Today, she was on the sofa watching TV when she again developed the left-sided CP radiating to left arm.  This time, it did not resolve and was more severe (10/10).  She took NTG without effect.  She called EMS, she got 2 more NTGs with EMS with resolution of CP.  She is now CP-free.  Pain lasted about 45 minutes total.  She went to [facility].  Troponin was negative x 1 but ECG showed new anterolateral ST depression.  Recent chest pain has been reminiscent of prior ischemic chest pain.   Also of note, she was found on 1/16 echo to have moderate aortic stenosis.  She has diastolic CHF on Lasix, recently increased to 60 mg daily because of LE edema.  Edema has resolved.  Creatinine up to 1.75 today, baseline appears to be around 1.4.   Review of systems complete and found to be negative unless listed above   PMH: 1. CAD: Multiple stents.  Most recent cath was in 3/15 with unstable angina.  Patient had 90% pLAD, 95% small diagonal (too small for intervention), 50% OM1, 60% mRCA => DES to pLAD.  Cath was complicated by large groin hematoma.  2. Chronic diastolic CHF: Echo (3/53) with EF 65-70%, mild LVH, moderate AS with mean gradient 20 mmHg.  3. Aortic stenosis: Moderate on 1/16 echo.  4. CKD 5. DM2 6. GERD 7.  H/o spinal stenosis 8. HTN 9. Hyperlipidemia 10. H/o pancreatic cysts 11. RBBB (chronic) 12. TIA 13. H/o cholecystectomy 14. H/o appendectomy 15. Polymyalgia rheumatica: On prednisone.   Family History  Problem Relation Age of Onset  . Uterine cancer Mother 33  . Other Father 60    cervical fracture    History   Social History  . Marital Status: Married    Spouse Name: N/A  . Number of Children: N/A  . Years of Education: N/A   Occupational History  . Not on file.   Social History Main Topics  . Smoking status: Never Smoker   . Smokeless tobacco: Never Used  . Alcohol Use: No  . Drug Use: No  . Sexual Activity: No   Other Topics Concern  . Not on file   Social History Narrative     Prescriptions prior to admission  Medication Sig Dispense Refill Last Dose  . amLODipine (NORVASC) 10 MG tablet Take 10 mg by mouth daily.     Taking  . aspirin 81 MG tablet Take 81 mg by mouth daily.     Taking  . clopidogrel (PLAVIX) 75 MG tablet Take 1 tablet (75 mg total) by mouth daily. 90 tablet 3 Taking  . divalproex (DEPAKOTE) 500 MG 24 hr tablet Take 500 mg by mouth daily.     Taking  . furosemide (LASIX) 40 MG tablet Take one  tab by mouth every morning & one tab every evening around 4 pm. 180 tablet 3 Taking  . glimepiride (AMARYL) 4 MG tablet Take 4 mg by mouth 2 (two) times daily.     Taking  . HYDROcodone-acetaminophen (NORCO) 10-325 MG per tablet Take 1 tablet by mouth every 6 (six) hours as needed for pain.   Taking  . insulin lispro (HUMALOG) 100 UNIT/ML injection Inject 0-10 Units into the skin 3 (three) times daily before meals. Pt is on a sliding scale   Taking  . isosorbide mononitrate (IMDUR) 60 MG 24 hr tablet Take 1.5 tablets (90 mg total) by mouth at bedtime. 135 tablet 3 Taking  . latanoprost (XALATAN) 0.005 % ophthalmic solution Place 1 drop into both eyes at bedtime.     Taking  . lisinopril (PRINIVIL,ZESTRIL) 10 MG tablet Take 1 tablet (10 mg total) by mouth  daily. 90 tablet 3 Taking  . mirabegron ER (MYRBETRIQ) 25 MG TB24 tablet Take 25 mg by mouth daily.   Taking  . nitroGLYCERIN (NITROSTAT) 0.4 MG SL tablet Place 1 tablet (0.4 mg total) under the tongue every 5 (five) minutes as needed for chest pain. 30 tablet 1 Taking  . pantoprazole (PROTONIX) 40 MG tablet Take 40 mg by mouth daily.   Taking  . potassium chloride SA (K-DUR,KLOR-CON) 20 MEQ tablet Take 20 mEq by mouth daily.   Taking  . predniSONE (DELTASONE) 1 MG tablet Take 4 mg by mouth every morning.    Taking  . rosuvastatin (CRESTOR) 20 MG tablet Take 20 mg by mouth daily.   Taking  . timolol (TIMOPTIC) 0.5 % ophthalmic solution Place 1 drop into both eyes daily.    Taking    Physical Exam: Blood pressure 168/51, pulse 57, temperature 98 F (36.7 C), temperature source Oral, resp. rate 12, height 5\' 3"  (1.6 m), weight 131 lb 1.6 oz (59.467 kg), SpO2 97 %.  General: NAD Neck: JVP 7-8 cm, no thyromegaly or thyroid nodule.  Lungs: Clear to auscultation bilaterally with normal respiratory effort. CV: Nondisplaced PMI.  Heart regular S1/S2, no S3/S4, 3/6 crescendo-decrescendo murmur RUSB (S2 heard clearly).  No peripheral edema.  No carotid bruit.  Normal pedal pulses.  Abdomen: Soft, nontender, no hepatosplenomegaly, no distention.  Skin: Intact without lesions or rashes.  Neurologic: Alert and oriented x 3.  Psych: Normal affect. Extremities: No clubbing or cyanosis.  HEENT: Normal.   Labs: Na 139, K 4.5, creatinine 1.75, BUN 30, WBCs 6.8, plts 116K, HCT 35.9   Radiology: - CXR: No edema or PNA, RUL small nodule (recommend CT w/o contrast to evaluate)  EKG: NSR, RBBB, 1 mm ST depression V4-V6 (new)  ASSESSMENT AND PLAN: 81 yo with history of CAD s/p multiple stents (most recently DES to LAD in 7/67), diastolic CHF, moderate aortic stenosis, CKD, diabetes presents with symptoms concerning for unstable angina.  1. CAD: I am concerned for unstable angina.  There are ECG changes (new  anterolateral 1 mm ST depression on ECG from [place]).  Initial troponin was negative at [facility].  Chest pain radiating to left arm is similar to prior ischemic symptoms.  - Draw troponin now and cycle.  Repeat ECG. - Continue ASA 81, Plavix, heparin gtt, Crestor 20 daily. - Add metoprolol 12.5 mg bid (low dose as HR 50s-60s currently).  - I think that she is going to need LHC.  Will need to hydrate prior to cath.  Also would note that patient has had groin complications x 2 with  prior caths, hopefully this one can be done by right radial access. 2. CKD: Creatinine 1.75.  Risk for contrast nephropathy with coronary angiography.  - Stop lisinopril and Lasix for now.  - NS @ 75 cc/hr until cath, start now.  - Repeat BMET in am.  3. Chronic diastolic CHF: She does not look volume overloaded today.  As above, holding Lasix for cath + gentle hydration.  Watch closely for volume overload.  4. Type II diabetes: Sliding scale.  5. Aortic stenosis: Moderate by echo in 1/16.  Will repeat echo to assess wall motion and also quantify AS.  6. HTN: Follow closely off lisinopril.  Loralie Champagne 08/16/2014 12:15 AM

## 2014-08-16 ENCOUNTER — Encounter (HOSPITAL_COMMUNITY)
Admission: AD | Disposition: A | Discharge status: Skilled Nursing Facility | Payer: Medicare Other | Source: Other Acute Inpatient Hospital | Attending: Thoracic Surgery (Cardiothoracic Vascular Surgery)

## 2014-08-16 DIAGNOSIS — I25110 Atherosclerotic heart disease of native coronary artery with unstable angina pectoris: Principal | ICD-10-CM

## 2014-08-16 DIAGNOSIS — R7989 Other specified abnormal findings of blood chemistry: Secondary | ICD-10-CM

## 2014-08-16 HISTORY — PX: CARDIAC CATHETERIZATION: SHX172

## 2014-08-16 LAB — BASIC METABOLIC PANEL
Anion gap: 8 (ref 5–15)
BUN: 22 mg/dL — ABNORMAL HIGH (ref 6–20)
CO2: 29 mmol/L (ref 22–32)
Calcium: 8.8 mg/dL — ABNORMAL LOW (ref 8.9–10.3)
Chloride: 105 mmol/L (ref 101–111)
Creatinine, Ser: 1.47 mg/dL — ABNORMAL HIGH (ref 0.44–1.00)
GFR calc Af Amer: 38 mL/min — ABNORMAL LOW (ref >60)
GFR calc non Af Amer: 33 mL/min — ABNORMAL LOW (ref >60)
Glucose, Bld: 114 mg/dL — ABNORMAL HIGH (ref 65–99)
Potassium: 3.7 mmol/L (ref 3.5–5.1)
SODIUM: 142 mmol/L (ref 135–145)

## 2014-08-16 LAB — POCT ACTIVATED CLOTTING TIME: ACTIVATED CLOTTING TIME: 485 s

## 2014-08-16 LAB — COMPREHENSIVE METABOLIC PANEL
ALT: 12 U/L — ABNORMAL LOW (ref 14–54)
AST: 18 U/L (ref 15–41)
Albumin: 3 g/dL — ABNORMAL LOW (ref 3.5–5.0)
Alkaline Phosphatase: 32 U/L — ABNORMAL LOW (ref 38–126)
Anion gap: 11 (ref 5–15)
BUN: 24 mg/dL — ABNORMAL HIGH (ref 6–20)
CO2: 27 mmol/L (ref 22–32)
Calcium: 8.7 mg/dL — ABNORMAL LOW (ref 8.9–10.3)
Chloride: 101 mmol/L (ref 101–111)
Creatinine, Ser: 1.63 mg/dL — ABNORMAL HIGH (ref 0.44–1.00)
GFR calc Af Amer: 33 mL/min — ABNORMAL LOW (ref >60)
GFR calc non Af Amer: 29 mL/min — ABNORMAL LOW (ref >60)
Glucose, Bld: 232 mg/dL — ABNORMAL HIGH (ref 65–99)
Potassium: 3.7 mmol/L (ref 3.5–5.1)
Sodium: 139 mmol/L (ref 135–145)
Total Bilirubin: 0.6 mg/dL (ref 0.3–1.2)
Total Protein: 5 g/dL — ABNORMAL LOW (ref 6.5–8.1)

## 2014-08-16 LAB — MAGNESIUM: Magnesium: 1.7 mg/dL (ref 1.7–2.4)

## 2014-08-16 LAB — LIPID PANEL
CHOL/HDL RATIO: 2.1 ratio
Cholesterol: 111 mg/dL (ref 0–200)
HDL: 54 mg/dL (ref >40)
LDL Cholesterol: 43 mg/dL (ref 0–99)
TRIGLYCERIDES: 70 mg/dL (ref <150)
VLDL: 14 mg/dL (ref 0–40)

## 2014-08-16 LAB — CBC WITH DIFFERENTIAL/PLATELET
BASOS ABS: 0 10*3/uL (ref 0.0–0.1)
Basophils Relative: 0 % (ref 0–1)
Eosinophils Absolute: 0 10*3/uL (ref 0.0–0.7)
Eosinophils Relative: 1 % (ref 0–5)
HCT: 31.5 % — ABNORMAL LOW (ref 36.0–46.0)
HEMOGLOBIN: 10.5 g/dL — AB (ref 12.0–15.0)
Lymphocytes Relative: 34 % (ref 12–46)
Lymphs Abs: 1.9 10*3/uL (ref 0.7–4.0)
MCH: 30.5 pg (ref 26.0–34.0)
MCHC: 33.3 g/dL (ref 30.0–36.0)
MCV: 91.6 fL (ref 78.0–100.0)
MONO ABS: 0.4 10*3/uL (ref 0.1–1.0)
MONOS PCT: 7 % (ref 3–12)
Neutro Abs: 3.2 10*3/uL (ref 1.7–7.7)
Neutrophils Relative %: 58 % (ref 43–77)
Platelets: 102 10*3/uL — ABNORMAL LOW (ref 150–400)
RBC: 3.44 MIL/uL — ABNORMAL LOW (ref 3.87–5.11)
RDW: 15 % (ref 11.5–15.5)
WBC: 5.5 10*3/uL (ref 4.0–10.5)

## 2014-08-16 LAB — TSH: TSH: 0.782 u[IU]/mL (ref 0.350–4.500)

## 2014-08-16 LAB — PROTIME-INR
INR: 1.08 (ref 0.00–1.49)
Prothrombin Time: 14.2 seconds (ref 11.6–15.2)

## 2014-08-16 LAB — CBC
HCT: 31.9 % — ABNORMAL LOW (ref 36.0–46.0)
Hemoglobin: 10.6 g/dL — ABNORMAL LOW (ref 12.0–15.0)
MCH: 30.5 pg (ref 26.0–34.0)
MCHC: 33.2 g/dL (ref 30.0–36.0)
MCV: 91.7 fL (ref 78.0–100.0)
Platelets: 110 10*3/uL — ABNORMAL LOW (ref 150–400)
RBC: 3.48 MIL/uL — ABNORMAL LOW (ref 3.87–5.11)
RDW: 15.1 % (ref 11.5–15.5)
WBC: 5.3 10*3/uL (ref 4.0–10.5)

## 2014-08-16 LAB — BRAIN NATRIURETIC PEPTIDE: B Natriuretic Peptide: 185.6 pg/mL — ABNORMAL HIGH (ref 0.0–100.0)

## 2014-08-16 LAB — TROPONIN I
Troponin I: 0.19 ng/mL — ABNORMAL HIGH (ref <0.031)
Troponin I: 0.24 ng/mL — ABNORMAL HIGH (ref <0.031)
Troponin I: 0.13 ng/mL — ABNORMAL HIGH (ref <0.031)

## 2014-08-16 LAB — GLUCOSE, CAPILLARY
GLUCOSE-CAPILLARY: 119 mg/dL — AB (ref 65–99)
GLUCOSE-CAPILLARY: 294 mg/dL — AB (ref 65–99)
GLUCOSE-CAPILLARY: 178 mg/dL — AB (ref 65–99)
Glucose-Capillary: 75 mg/dL (ref 65–99)
Glucose-Capillary: 170 mg/dL — ABNORMAL HIGH (ref 65–99)

## 2014-08-16 LAB — HEPARIN LEVEL (UNFRACTIONATED): HEPARIN UNFRACTIONATED: 0.77 [IU]/mL — AB (ref 0.30–0.70)

## 2014-08-16 SURGERY — LEFT HEART CATH AND CORONARY ANGIOGRAPHY
Anesthesia: LOCAL

## 2014-08-16 MED ORDER — AMLODIPINE BESYLATE 10 MG PO TABS
10.0000 mg | ORAL_TABLET | Freq: Every day | ORAL | Status: DC
  Administered 2014-08-16 – 2014-08-21 (×7): 10 mg via ORAL
  Filled 2014-08-16 (×8): qty 1

## 2014-08-16 MED ORDER — HEPARIN SODIUM (PORCINE) 1000 UNIT/ML IJ SOLN
INTRAMUSCULAR | Status: DC | PRN
  Administered 2014-08-16: 3000 [IU] via INTRAVENOUS

## 2014-08-16 MED ORDER — HEPARIN SODIUM (PORCINE) 1000 UNIT/ML IJ SOLN
INTRAMUSCULAR | Status: AC
  Filled 2014-08-16: qty 1

## 2014-08-16 MED ORDER — RADIAL COCKTAIL (HEPARIN/VERAPAMIL/LIDOCAINE/NITRO)
Status: DC | PRN
  Administered 2014-08-16 (×2): 1 via INTRA_ARTERIAL

## 2014-08-16 MED ORDER — NITROGLYCERIN 1 MG/10 ML FOR IR/CATH LAB
INTRA_ARTERIAL | Status: AC
  Filled 2014-08-16: qty 10

## 2014-08-16 MED ORDER — VERAPAMIL HCL 2.5 MG/ML IV SOLN
INTRAVENOUS | Status: AC
  Filled 2014-08-16: qty 2

## 2014-08-16 MED ORDER — HEPARIN (PORCINE) IN NACL 100-0.45 UNIT/ML-% IJ SOLN: 700.0000 [IU]/h | INTRAMUSCULAR | Status: DC

## 2014-08-16 MED ORDER — SODIUM CHLORIDE 0.9 % IJ SOLN: 3.0000 mL | INTRAMUSCULAR | Status: DC | PRN

## 2014-08-16 MED ORDER — MIDAZOLAM HCL 2 MG/2ML IJ SOLN
INTRAMUSCULAR | Status: DC | PRN
  Administered 2014-08-16 (×2): 1 mg via INTRAVENOUS
  Administered 2014-08-16: 2 mg via INTRAVENOUS
  Administered 2014-08-16: 1 mg via INTRAVENOUS

## 2014-08-16 MED ORDER — MIDAZOLAM HCL 2 MG/2ML IJ SOLN
INTRAMUSCULAR | Status: AC
  Filled 2014-08-16: qty 2

## 2014-08-16 MED ORDER — SODIUM CHLORIDE 0.9 % IJ SOLN
3.0000 mL | Freq: Two times a day (BID) | INTRAMUSCULAR | Status: DC
Start: 2014-08-16 — End: 2014-08-22
  Administered 2014-08-17 – 2014-08-20 (×4): 3 mL via INTRAVENOUS

## 2014-08-16 MED ORDER — FENTANYL CITRATE (PF) 100 MCG/2ML IJ SOLN
INTRAMUSCULAR | Status: AC
  Filled 2014-08-16: qty 2

## 2014-08-16 MED ORDER — SODIUM CHLORIDE 0.9 % IV SOLN: 250.0000 mL | INTRAVENOUS | Status: DC | PRN

## 2014-08-16 MED ORDER — HEPARIN (PORCINE) IN NACL 100-0.45 UNIT/ML-% IJ SOLN
700.0000 [IU]/h | INTRAMUSCULAR | Status: DC
  Administered 2014-08-17: 04:00:00 700 [IU]/h via INTRAVENOUS
  Filled 2014-08-16: qty 250

## 2014-08-16 MED ORDER — BIVALIRUDIN BOLUS VIA INFUSION - CUPID
INTRAVENOUS | Status: DC | PRN
  Administered 2014-08-16: 44.625 mg via INTRAVENOUS

## 2014-08-16 MED ORDER — ONDANSETRON HCL 4 MG/2ML IJ SOLN
INTRAMUSCULAR | Status: DC | PRN
  Administered 2014-08-16: 4 mg via INTRAVENOUS

## 2014-08-16 MED ORDER — SODIUM CHLORIDE 0.9 % WEIGHT BASED INFUSION: 1.0000 mL/kg/h | INTRAVENOUS | Status: DC

## 2014-08-16 MED ORDER — FENTANYL CITRATE (PF) 100 MCG/2ML IJ SOLN
INTRAMUSCULAR | Status: DC | PRN
  Administered 2014-08-16 (×5): 25 ug via INTRAVENOUS

## 2014-08-16 MED ORDER — INSULIN ASPART 100 UNIT/ML ~~LOC~~ SOLN
0.0000 [IU] | Freq: Three times a day (TID) | SUBCUTANEOUS | Status: DC
  Administered 2014-08-16: 17:00:00 8 [IU] via SUBCUTANEOUS
  Administered 2014-08-18: 13:00:00 3 [IU] via SUBCUTANEOUS
  Administered 2014-08-18: 2 [IU] via SUBCUTANEOUS
  Administered 2014-08-18 – 2014-08-19 (×2): 5 [IU] via SUBCUTANEOUS
  Administered 2014-08-19 – 2014-08-20 (×2): 3 [IU] via SUBCUTANEOUS
  Administered 2014-08-20: 5 [IU] via SUBCUTANEOUS
  Administered 2014-08-20: 3 [IU] via SUBCUTANEOUS
  Administered 2014-08-21 (×2): 2 [IU] via SUBCUTANEOUS
  Administered 2014-08-21: 8 [IU] via SUBCUTANEOUS

## 2014-08-16 MED ORDER — BIVALIRUDIN 250 MG IV SOLR
INTRAVENOUS | Status: AC
  Filled 2014-08-16: qty 250

## 2014-08-16 MED ORDER — IOHEXOL 350 MG/ML SOLN
INTRAVENOUS | Status: DC | PRN
  Administered 2014-08-16: 145 mL via INTRA_ARTERIAL

## 2014-08-16 MED ORDER — ASPIRIN 81 MG PO CHEW
81.0000 mg | CHEWABLE_TABLET | ORAL | Status: AC
  Administered 2014-08-16: 81 mg via ORAL
  Filled 2014-08-16: qty 1

## 2014-08-16 MED ORDER — SODIUM CHLORIDE 0.9 % IV SOLN
250.0000 mg | INTRAVENOUS | Status: DC | PRN
  Administered 2014-08-16: 1 mg/kg/h via INTRAVENOUS

## 2014-08-16 MED ORDER — SODIUM CHLORIDE 0.9 % IJ SOLN
3.0000 mL | Freq: Two times a day (BID) | INTRAMUSCULAR | Status: DC
  Administered 2014-08-16: 3 mL via INTRAVENOUS

## 2014-08-16 MED ORDER — VERAPAMIL HCL 2.5 MG/ML IV SOLN
INTRAVENOUS | Status: DC | PRN
  Administered 2014-08-16: 14:00:00 via INTRA_ARTERIAL

## 2014-08-16 MED ORDER — SODIUM CHLORIDE 0.9 % WEIGHT BASED INFUSION
3.0000 mL/kg/h | INTRAVENOUS | Status: AC
  Administered 2014-08-16: 3 mL/kg/h via INTRAVENOUS

## 2014-08-16 MED ORDER — LIDOCAINE HCL (PF) 1 % IJ SOLN
INTRAMUSCULAR | Status: AC
  Filled 2014-08-16: qty 30

## 2014-08-16 MED ORDER — INSULIN ASPART 100 UNIT/ML ~~LOC~~ SOLN
0.0000 [IU] | Freq: Every day | SUBCUTANEOUS | Status: DC
  Administered 2014-08-21: 3 [IU] via SUBCUTANEOUS

## 2014-08-16 SURGICAL SUPPLY — 28 items
BALLN EUPHORA RX 2.0X12 (BALLOONS) ×2
BALLOON EUPHORA RX 2.0X12 (BALLOONS) ×1 IMPLANT
CATH INFINITI 5 FR AR1 MOD (CATHETERS) ×2 IMPLANT
CATH INFINITI 5FR ANG PIGTAIL (CATHETERS) ×2 IMPLANT
CATH INFINITI 5FR MULTPACK ANG (CATHETERS) IMPLANT
CATH LAUNCHER 5F AL1 (CATHETERS) ×1 IMPLANT
CATH LAUNCHER 5F AR1 (CATHETERS) ×2 IMPLANT
CATH LAUNCHER 5F MPST (CATHETERS) ×1 IMPLANT
CATH LAUNCHER 6FR 3DRIGHT (CATHETERS) ×1 IMPLANT
CATH OPTITORQUE TIG 4.0 5F (CATHETERS) ×2 IMPLANT
CATHETER LAUNCHER 5F AL1 (CATHETERS) ×2
CATHETER LAUNCHER 5F MPST (CATHETERS) ×2
CATHETER LAUNCHER 6FR 3DRIGHT (CATHETERS) ×2
DEVICE RAD COMP TR BAND LRG (VASCULAR PRODUCTS) ×2 IMPLANT
GLIDESHEATH SLEND A-KIT 6F 22G (SHEATH) ×2 IMPLANT
GUIDE CATH RUNWAY 6FR AR1 (CATHETERS) ×2 IMPLANT
KIT ENCORE 26 ADVANTAGE (KITS) ×2 IMPLANT
KIT HEART LEFT (KITS) ×2 IMPLANT
PACK CARDIAC CATHETERIZATION (CUSTOM PROCEDURE TRAY) ×2 IMPLANT
SHEATH PINNACLE 5F 10CM (SHEATH) IMPLANT
SYR MEDRAD MARK V 150ML (SYRINGE) ×2 IMPLANT
TRANSDUCER W/STOPCOCK (MISCELLANEOUS) ×2 IMPLANT
TUBING CIL FLEX 10 FLL-RA (TUBING) ×2 IMPLANT
WIRE ASAHI PROWATER 180CM (WIRE) ×2 IMPLANT
WIRE EMERALD 3MM-J .035X150CM (WIRE) IMPLANT
WIRE LUGE 182CM (WIRE) ×2 IMPLANT
WIRE PT2 MS 185 (WIRE) ×2 IMPLANT
WIRE SAFE-T 1.5MM-J .035X260CM (WIRE) ×4 IMPLANT

## 2014-08-16 NOTE — Progress Notes (Signed)
TR BAND REMOVAL  LOCATION:  right radial  DEFLATED PER PROTOCOL:  Yes.    TIME BAND OFF / DRESSING APPLIED:   2130   SITE UPON ARRIVAL:   Level 0  SITE AFTER BAND REMOVAL:  Level 0  REVERSE ALLEN'S TEST:    positive  CIRCULATION SENSATION AND MOVEMENT:  Within Normal Limits  Yes.    COMMENTS:  Delayed deflation of band due to previous RN attempt to remove complicated by bleeding.

## 2014-08-16 NOTE — Progress Notes (Addendum)
ANTICOAGULATION CONSULT NOTE - Initial Consult  Pharmacy Consult for Heparin Indication: chest pain/ACS  Allergies  Allergen Reactions  . Clarithromycin     REACTION: Vomiting and diarrhea.  . Codeine   . Sulfa Antibiotics   . Atorvastatin Other (See Comments)    Made her knees buckle and muscles ache    Patient Measurements: Height: 5\' 3"  (160 cm) Weight: 131 lb 1.6 oz (59.467 kg) IBW/kg (Calculated) : 52.4  Vital Signs: Temp: 98 F (36.7 C) (07/25 2237) Temp Source: Oral (07/25 2237) BP: 168/51 mmHg (07/25 2237) Pulse Rate: 57 (07/25 2237)  Labs (at [facility]): WBC  6.2 Hgb  11.7 Hct  35.9 Plt  116  SCr  1.75 No results for input(s): HGB, HCT, PLT, APTT, LABPROT, INR, HEPARINUNFRC, CREATININE, CKTOTAL, CKMB, TROPONINI in the last 72 hours.  CrCl cannot be calculated (Patient has no serum creatinine result on file.).   Medical History: Past Medical History  Diagnosis Date  . CAD (coronary artery disease)     Interventions in the past  . Groin hematoma     April, 2011  . Hypertension   . Diabetes mellitus   . Dyslipidemia   . GERD (gastroesophageal reflux disease)   . Schatzki's ring   . Renal insufficiency   . Polymyalgia rheumatica   . Osteoarthritis     arthroscopic surgery right knee February, 2012  . Spinal stenosis     history of surgery for this  . Ejection fraction     EF 65%, echo, April, 2012, aortic valve sclerosis with very slight gradient  . Preoperative evaluation to rule out surgical contraindication     Patient needs back surgery by Dr. Carloyn Manner, May, 2012                . Pancreas cyst     The patient has multiple pancreatic cysts.  This is followed at [facility]         . Complication of anesthesia     "felt like I was smothering once with mask on my face"  . RBBB (right bundle branch block)     old  . Bradycardia, sinus   . Myocardial infarction 2011; 08/2010  . Shortness of breath on exertion   . Blood transfusion   . Anemia    . Stroke 01/18/11    "I've had 2 TIA's"  . Cancer     ""had hysterectomy for a 6 showing cancer; think that's pretty strong  . Aortic stenosis     very mild, echo 12/2010  . Femoral bruit     01/2011 hosp, but no pseudoaneurysm or AV fistula    Medications:  Prescriptions prior to admission  Medication Sig Dispense Refill Last Dose  . amLODipine (NORVASC) 10 MG tablet Take 10 mg by mouth daily.     Taking  . aspirin 81 MG tablet Take 81 mg by mouth daily.     Taking  . clopidogrel (PLAVIX) 75 MG tablet Take 1 tablet (75 mg total) by mouth daily. 90 tablet 3 Taking  . divalproex (DEPAKOTE) 500 MG 24 hr tablet Take 500 mg by mouth daily.     Taking  . furosemide (LASIX) 40 MG tablet Take one tab by mouth every morning & one tab every evening around 4 pm. 180 tablet 3 Taking  . glimepiride (AMARYL) 4 MG tablet Take 4 mg by mouth 2 (two) times daily.     Taking  . HYDROcodone-acetaminophen (NORCO) 10-325 MG per tablet Take 1 tablet  by mouth every 6 (six) hours as needed for pain.   Taking  . insulin lispro (HUMALOG) 100 UNIT/ML injection Inject 0-10 Units into the skin 3 (three) times daily before meals. Pt is on a sliding scale   Taking  . isosorbide mononitrate (IMDUR) 60 MG 24 hr tablet Take 1.5 tablets (90 mg total) by mouth at bedtime. 135 tablet 3 Taking  . latanoprost (XALATAN) 0.005 % ophthalmic solution Place 1 drop into both eyes at bedtime.     Taking  . lisinopril (PRINIVIL,ZESTRIL) 10 MG tablet Take 1 tablet (10 mg total) by mouth daily. 90 tablet 3 Taking  . mirabegron ER (MYRBETRIQ) 25 MG TB24 tablet Take 25 mg by mouth daily.   Taking  . nitroGLYCERIN (NITROSTAT) 0.4 MG SL tablet Place 1 tablet (0.4 mg total) under the tongue every 5 (five) minutes as needed for chest pain. 30 tablet 1 Taking  . pantoprazole (PROTONIX) 40 MG tablet Take 40 mg by mouth daily.   Taking  . potassium chloride SA (K-DUR,KLOR-CON) 20 MEQ tablet Take 20 mEq by mouth daily.   Taking  . predniSONE  (DELTASONE) 1 MG tablet Take 4 mg by mouth every morning.    Taking  . rosuvastatin (CRESTOR) 20 MG tablet Take 20 mg by mouth daily.   Taking  . timolol (TIMOPTIC) 0.5 % ophthalmic solution Place 1 drop into both eyes daily.    Taking    Assessment: 81 y.o. female with chest pain for heparin.  Heparin 5000 units IV bolus, 800 units/hr started at [facility] at [place] of Therapy:  Heparin level 0.3-0.7 units/ml Monitor platelets by anticoagulation protocol: Yes   Plan:  Continue Heparin at current rate  Follow-up am labs.  Caryl Pina 08/16/2014,12:03 AM  Addendum: Initial level 0.77  Will decrease heparin 700 units/hr F/U after cath  Phillis Knack, PharmD, BCPS 08/16/2014 6:06 AM

## 2014-08-16 NOTE — Progress Notes (Signed)
   SUBJECTIVE:  No further CP.  History over the past few weeks is concerning for [place].  She is taking more NTG. Pain occuring at rest at times.  OBJECTIVE:   Vitals:   Filed Vitals:   08/15/14 2237 08/16/14 0500  BP: 168/51 145/51  Pulse: 57 50  Temp: 98 F (36.7 C) 97.5 F (36.4 C)  TempSrc: Oral   Resp: 12 12  Height: 5\' 3"  (1.6 m)   Weight: 131 lb 1.6 oz (59.467 kg)   SpO2: 97% 97%   I&O's:   Intake/Output Summary (Last 24 hours) at 08/16/14 2458 Last data filed at 08/16/14 0500  Gross per 24 hour  Intake      0 ml  Output    200 ml  Net   -200 ml   TELEMETRY: Reviewed telemetry pt in sinus bradycardia:     PHYSICAL EXAM General: Well developed, well nourished, in no acute distress Head:   Normal cephalic and atramatic  Lungs:   Clear bilaterally to auscultation. Heart:   Bradycardia S1 S2  No JVD.   Abdomen: abdomen soft and non-tender Msk:  Back normal,  Normal strength and tone for age. Extremities:   No edema.   Neuro: Alert and oriented. Psych:  Normal affect, responds appropriately Skin: No rash   LABS: Basic Metabolic Panel:  Recent Labs  08/16/14 0042 08/16/14 0526  NA 139 142  K 3.7 3.7  CL 101 105  CO2 27 29  GLUCOSE 232* 114*  BUN 24* 22*  CREATININE 1.63* 1.47*  CALCIUM 8.7* 8.8*  MG 1.7  --    Liver Function Tests:  Recent Labs  08/16/14 0042  AST 18  ALT 12*  ALKPHOS 32*  BILITOT 0.6  PROT 5.0*  ALBUMIN 3.0*   No results for input(s): LIPASE, AMYLASE in the last 72 hours. CBC:  Recent Labs  08/16/14 0042 08/16/14 0526  WBC 5.5 5.3  NEUTROABS 3.2  --   HGB 10.5* 10.6*  HCT 31.5* 31.9*  MCV 91.6 91.7  PLT 102* 110*   Cardiac Enzymes:  Recent Labs  08/16/14 0042 08/16/14 0526  TROPONINI 0.19* 0.24*   BNP: Invalid input(s): POCBNP D-Dimer: No results for input(s): DDIMER in the last 72 hours. Hemoglobin A1C: No results for input(s): HGBA1C in the last 72 hours. Fasting Lipid Panel:  Recent Labs  08/16/14 0526  CHOL 111  HDL 54  LDLCALC 43  TRIG 70  CHOLHDL 2.1   Thyroid Function Tests:  Recent Labs  08/16/14 0042  TSH 0.782   Anemia Panel: No results for input(s): VITAMINB12, FOLATE, FERRITIN, TIBC, IRON, RETICCTPCT in the last 72 hours. Coag Panel:   Lab Results  Component Value Date   INR 1.08 08/16/2014   INR 0.88 03/25/2013   INR 0.89 03/07/2010    RADIOLOGY: No results found.    ASSESSMENT: Sharon Hubbard:    [place]: IV heparin. Plan for cath today.  Risks and benefits explained.  All questions answered.  She had groin bleeds twice in the past.  Try for radial procedure.    No V-gram due to renal insufficiency.  Cr better on repeat check. Continue gentle hydration.  Troponin mildly elevated.  DES in Jan 2015.   Moderate AS in Jan 2016 by echo.  I do not think her current presentation is due to her moderate AS.  Will cancel echo for now.   Sharon Booze, MD  08/16/2014  9:27 AM

## 2014-08-16 NOTE — Progress Notes (Signed)
ANTICOAGULATION CONSULT NOTE - Follow Up Consult  Pharmacy Consult for Heparin Indication: CAD, s/p cath  Allergies  Allergen Reactions  . Clarithromycin     REACTION: Vomiting and diarrhea.  . Codeine   . Sulfa Antibiotics   . Atorvastatin Other (See Comments)    Made her knees buckle and muscles ache    Patient Measurements: Height: 5\' 3"  (160 cm) Weight: 131 lb 1.6 oz (59.467 kg) IBW/kg (Calculated) : 52.4 Heparin Dosing Weight: 59.5 kg  Vital Signs: Temp: 97.7 F (36.5 C) (07/26 1509) Temp Source: Oral (07/26 1509) BP: 131/37 mmHg (07/26 1509) Pulse Rate: 37 (07/26 1509)  Labs:  Recent Labs  08/16/14 0042 08/16/14 0526  HGB 10.5* 10.6*  HCT 31.5* 31.9*  PLT 102* 110*  LABPROT  --  14.2  INR  --  1.08  HEPARINUNFRC  --  0.77*  CREATININE 1.63* 1.47*  TROPONINI 0.19* 0.24*    Estimated Creatinine Clearance: 25.7 mL/min (by C-G formula based on Cr of 1.47).   Medications:  Scheduled:  . amLODipine  10 mg Oral Daily  . aspirin EC  81 mg Oral Daily  . clopidogrel  75 mg Oral Daily  . divalproex  500 mg Oral Daily  . insulin aspart  0-15 Units Subcutaneous TID WC  . insulin aspart  0-5 Units Subcutaneous QHS  . isosorbide mononitrate  90 mg Oral QHS  . latanoprost  1 drop Both Eyes QHS  . metoprolol tartrate  12.5 mg Oral BID  . mirabegron ER  25 mg Oral Daily  . pantoprazole  40 mg Oral Daily  . predniSONE  4 mg Oral Q breakfast  . rosuvastatin  20 mg Oral Daily  . sodium chloride  3 mL Intravenous Q12H  . timolol  1 drop Both Eyes Daily   Infusions:  . sodium chloride 75 mL/hr at 08/16/14 0049  . sodium chloride    . heparin 700 Units/hr (08/16/14 5284)    Assessment: 81 yo F s/p radial cath today with CAD requiring staged PCI through groin access planned for 7/27.  To restart heparin tonight 8 hours after TR band removal.  Anticipated TR banc removal ~ 1900.  Heparin to start at 0300 7/27.  Goal of Therapy:  Heparin level 0.3-0.7  units/ml Monitor platelets by anticoagulation protocol: Yes   Plan:  Restart heparin at 700 units/hr at 0300 7/27. Retime daily heparin level to 1000 7/27. Daily heparin level and CBC F/u after repeat cath 7/27 (scheduled for 1300) Monitor s/sx of bleeding  [employer], Pharm.D., BCPS Clinical Pharmacist Pager [phone] 08/16/2014 4:17 PM

## 2014-08-16 NOTE — H&P (View-Only) (Signed)
   SUBJECTIVE:  No further CP.  History over the past few weeks is concerning for [place].  She is taking more NTG. Pain occuring at rest at times.  OBJECTIVE:   Vitals:   Filed Vitals:   08/15/14 2237 08/16/14 0500  BP: 168/51 145/51  Pulse: 57 50  Temp: 98 F (36.7 C) 97.5 F (36.4 C)  TempSrc: Oral   Resp: 12 12  Height: 5\' 3"  (1.6 m)   Weight: 131 lb 1.6 oz (59.467 kg)   SpO2: 97% 97%   I&O's:   Intake/Output Summary (Last 24 hours) at 08/16/14 4128 Last data filed at 08/16/14 0500  Gross per 24 hour  Intake      0 ml  Output    200 ml  Net   -200 ml   TELEMETRY: Reviewed telemetry pt in sinus bradycardia:     PHYSICAL EXAM General: Well developed, well nourished, in no acute distress Head:   Normal cephalic and atramatic  Lungs:   Clear bilaterally to auscultation. Heart:   Bradycardia S1 S2  No JVD.   Abdomen: abdomen soft and non-tender Msk:  Back normal,  Normal strength and tone for age. Extremities:   No edema.   Neuro: Alert and oriented. Psych:  Normal affect, responds appropriately Skin: No rash   LABS: Basic Metabolic Panel:  Recent Labs  08/16/14 0042 08/16/14 0526  NA 139 142  K 3.7 3.7  CL 101 105  CO2 27 29  GLUCOSE 232* 114*  BUN 24* 22*  CREATININE 1.63* 1.47*  CALCIUM 8.7* 8.8*  MG 1.7  --    Liver Function Tests:  Recent Labs  08/16/14 0042  AST 18  ALT 12*  ALKPHOS 32*  BILITOT 0.6  PROT 5.0*  ALBUMIN 3.0*   No results for input(s): LIPASE, AMYLASE in the last 72 hours. CBC:  Recent Labs  08/16/14 0042 08/16/14 0526  WBC 5.5 5.3  NEUTROABS 3.2  --   HGB 10.5* 10.6*  HCT 31.5* 31.9*  MCV 91.6 91.7  PLT 102* 110*   Cardiac Enzymes:  Recent Labs  08/16/14 0042 08/16/14 0526  TROPONINI 0.19* 0.24*   BNP: Invalid input(s): POCBNP D-Dimer: No results for input(s): DDIMER in the last 72 hours. Hemoglobin A1C: No results for input(s): HGBA1C in the last 72 hours. Fasting Lipid Panel:  Recent Labs  08/16/14 0526  CHOL 111  HDL 54  LDLCALC 43  TRIG 70  CHOLHDL 2.1   Thyroid Function Tests:  Recent Labs  08/16/14 0042  TSH 0.782   Anemia Panel: No results for input(s): VITAMINB12, FOLATE, FERRITIN, TIBC, IRON, RETICCTPCT in the last 72 hours. Coag Panel:   Lab Results  Component Value Date   INR 1.08 08/16/2014   INR 0.88 03/25/2013   INR 0.89 03/07/2010    RADIOLOGY: No results found.    ASSESSMENT: Sharon Hubbard:    [place]: IV heparin. Plan for cath today.  Risks and benefits explained.  All questions answered.  She had groin bleeds twice in the past.  Try for radial procedure.    No V-gram due to renal insufficiency.  Cr better on repeat check. Continue gentle hydration.  Troponin mildly elevated.  DES in Jan 2015.   Moderate AS in Jan 2016 by echo.  I do not think her current presentation is due to her moderate AS.  Will cancel echo for now.   Jettie Booze, MD  08/16/2014  9:27 AM

## 2014-08-16 NOTE — Progress Notes (Signed)
UR Completed Albeiro Trompeter Graves-Bigelow, RN,BSN [phone]  

## 2014-08-16 NOTE — Interval H&P Note (Signed)
History and Physical Interval Note:  08/16/2014 12:34 PM  Sharon Hubbard  has presented today for surgery, with the diagnosis of unstable angina/acs  The various methods of treatment have been discussed with the patient and family. After consideration of risks, benefits and other options for treatment, the patient has consented to  Procedure(s): Left Heart Cath and Coronary Angiography (N/A) with possible PCI as a surgical intervention .   The patient's history has been reviewed, patient examined, no change in status, stable for surgery.  I have reviewed the patient's chart and labs.  Questions were answered to the patient's satisfaction.     [place], [place]  Cath Lab Visit (complete for each Cath Lab visit)  Clinical Evaluation Leading to the Procedure:   ACS: Yes.    Non-ACS:    Anginal Classification: CCS IV  Anti-ischemic medical therapy: Maximal Therapy (2 or more classes of medications)  Non-Invasive Test Results: No non-invasive testing performed  Prior CABG: No previous CABG  TIMI SCORE  Patient Information:  TIMI Score is 5   A/NSTEMI and high-risk features for short-term risk of death or nonfatal MI Revascularization of the presumed culprit artery  A (9)  Indication: 11; Score: 9 TIMI SCORE  Patient Information:  TIMI Score is 5   A/NSTEMI and high-risk features for short-term risk of death or nonfatal MI Revascularization of multiple coronary arteries when the culprit artery cannot clearly be determined  A (9)  Indication: 12; Score: 9   Sharon Hubbard, Sharon Hubbard, M.D., M.S. Interventional Cardiologist   Pager # [phone]

## 2014-08-16 NOTE — Progress Notes (Signed)
Nutrition Brief Note  Patient identified on the Malnutrition Screening Tool (MST) Report. No significant weight loss noted over the past year.  Wt Readings from Last 15 Encounters:  08/15/14 131 lb 1.6 oz (59.467 kg)  07/20/14 138 lb (62.596 kg)  07/07/14 133 lb (60.328 kg)  06/13/14 137 lb (62.143 kg)  03/09/14 138 lb (62.596 kg)  01/28/14 140 lb 9.6 oz (63.776 kg)  07/29/13 141 lb 1.9 oz (64.012 kg)  05/25/13 142 lb (64.411 kg)  05/18/13 141 lb 9.6 oz (64.229 kg)  04/27/13 142 lb (64.411 kg)  04/27/13 142 lb (64.411 kg)  04/13/13 142 lb 11.2 oz (64.728 kg)  03/26/13 142 lb 3.2 oz (64.5 kg)  03/23/13 144 lb (65.318 kg)  03/05/13 146 lb 12 oz (66.565 kg)    Body mass index is 23.23 kg/(m^2). Patient meets criteria for normal weight based on current BMI.   Current diet order is heart healthy, CHO modified. S/P cardiac cath today. Labs and medications reviewed.   No nutrition interventions warranted at this time. If nutrition issues arise, please consult RD.   Molli Barrows, RD, LDN, [place] Pager [phone] After Hours Pager [phone]

## 2014-08-17 ENCOUNTER — Encounter (HOSPITAL_COMMUNITY)
Admission: AD | Disposition: A | Discharge status: Skilled Nursing Facility | Payer: Self-pay | Source: Other Acute Inpatient Hospital | Attending: Thoracic Surgery (Cardiothoracic Vascular Surgery)

## 2014-08-17 ENCOUNTER — Encounter (HOSPITAL_COMMUNITY): Payer: Self-pay | Admitting: Cardiology

## 2014-08-17 DIAGNOSIS — R001 Bradycardia, unspecified: Secondary | ICD-10-CM

## 2014-08-17 HISTORY — PX: CARDIAC CATHETERIZATION: SHX172

## 2014-08-17 LAB — CBC
HCT: 31.9 % — ABNORMAL LOW (ref 36.0–46.0)
HEMOGLOBIN: 10.6 g/dL — AB (ref 12.0–15.0)
MCH: 30.6 pg (ref 26.0–34.0)
MCHC: 33.2 g/dL (ref 30.0–36.0)
MCV: 92.2 fL (ref 78.0–100.0)
Platelets: 100 10*3/uL — ABNORMAL LOW (ref 150–400)
RBC: 3.46 MIL/uL — ABNORMAL LOW (ref 3.87–5.11)
RDW: 15.1 % (ref 11.5–15.5)
WBC: 5 10*3/uL (ref 4.0–10.5)

## 2014-08-17 LAB — HEPARIN LEVEL (UNFRACTIONATED): HEPARIN UNFRACTIONATED: 0.31 [IU]/mL (ref 0.30–0.70)

## 2014-08-17 LAB — GLUCOSE, CAPILLARY
GLUCOSE-CAPILLARY: 117 mg/dL — AB (ref 65–99)
GLUCOSE-CAPILLARY: 155 mg/dL — AB (ref 65–99)
Glucose-Capillary: 158 mg/dL — ABNORMAL HIGH (ref 65–99)
Glucose-Capillary: 185 mg/dL — ABNORMAL HIGH (ref 65–99)

## 2014-08-17 LAB — BASIC METABOLIC PANEL
Anion gap: 9 (ref 5–15)
BUN: 21 mg/dL — ABNORMAL HIGH (ref 6–20)
CALCIUM: 8.4 mg/dL — AB (ref 8.9–10.3)
CHLORIDE: 106 mmol/L (ref 101–111)
CO2: 25 mmol/L (ref 22–32)
Creatinine, Ser: 1.35 mg/dL — ABNORMAL HIGH (ref 0.44–1.00)
GFR calc Af Amer: 42 mL/min — ABNORMAL LOW (ref >60)
GFR, EST NON AFRICAN AMERICAN: 36 mL/min — AB (ref >60)
Glucose, Bld: 139 mg/dL — ABNORMAL HIGH (ref 65–99)
Potassium: 3.8 mmol/L (ref 3.5–5.1)
Sodium: 140 mmol/L (ref 135–145)

## 2014-08-17 LAB — POCT ACTIVATED CLOTTING TIME: Activated Clotting Time: 343 seconds

## 2014-08-17 SURGERY — CORONARY ATHERECTOMY
Anesthesia: LOCAL

## 2014-08-17 MED ORDER — HEPARIN (PORCINE) IN NACL 2-0.9 UNIT/ML-% IJ SOLN
INTRAMUSCULAR | Status: AC
  Filled 2014-08-17: qty 1500

## 2014-08-17 MED ORDER — SODIUM CHLORIDE 0.9 % IJ SOLN: 3.0000 mL | Freq: Two times a day (BID) | INTRAMUSCULAR | Status: DC

## 2014-08-17 MED ORDER — BIVALIRUDIN 250 MG IV SOLR
INTRAVENOUS | Status: AC
  Filled 2014-08-17: qty 250

## 2014-08-17 MED ORDER — FENTANYL CITRATE (PF) 100 MCG/2ML IJ SOLN
INTRAMUSCULAR | Status: AC
  Filled 2014-08-17: qty 2

## 2014-08-17 MED ORDER — NITROGLYCERIN IN D5W 200-5 MCG/ML-% IV SOLN
INTRAVENOUS | Status: AC
Start: 2014-08-17 — End: 2014-08-17
  Filled 2014-08-17: qty 250

## 2014-08-17 MED ORDER — NITROGLYCERIN 1 MG/10 ML FOR IR/CATH LAB
INTRA_ARTERIAL | Status: AC
  Filled 2014-08-17: qty 10

## 2014-08-17 MED ORDER — FENTANYL CITRATE (PF) 100 MCG/2ML IJ SOLN
INTRAMUSCULAR | Status: DC | PRN
  Administered 2014-08-17 (×2): 25 ug via INTRAVENOUS

## 2014-08-17 MED ORDER — SODIUM CHLORIDE 0.9 % IV SOLN
1.7500 mg/kg/h | INTRAVENOUS | Status: DC
  Filled 2014-08-17: qty 250

## 2014-08-17 MED ORDER — MAGNESIUM HYDROXIDE 400 MG/5ML PO SUSP
15.0000 mL | Freq: Every day | ORAL | Status: DC | PRN
  Administered 2014-08-17: 15 mL via ORAL
  Filled 2014-08-17: qty 30

## 2014-08-17 MED ORDER — SODIUM CHLORIDE 0.9 % IJ SOLN: 3.0000 mL | INTRAMUSCULAR | Status: DC | PRN

## 2014-08-17 MED ORDER — BISACODYL 10 MG RE SUPP: 10.0000 mg | Freq: Every day | RECTAL | Status: DC | PRN

## 2014-08-17 MED ORDER — SODIUM CHLORIDE 0.9 % IV SOLN
INTRAVENOUS | Status: DC
Start: 2014-08-17 — End: 2014-08-17

## 2014-08-17 MED ORDER — SODIUM CHLORIDE 0.9 % IV SOLN
250.0000 mg | INTRAVENOUS | Status: DC | PRN
  Administered 2014-08-17: 1.75 mg/kg/h via INTRAVENOUS

## 2014-08-17 MED ORDER — SODIUM CHLORIDE 0.9 % IV SOLN
250.0000 mL | INTRAVENOUS | Status: DC | PRN
Start: 2014-08-17 — End: 2014-08-22
  Administered 2014-08-18 – 2014-08-21 (×2): 10 mL via INTRAVENOUS

## 2014-08-17 MED ORDER — MIDAZOLAM HCL 2 MG/2ML IJ SOLN
INTRAMUSCULAR | Status: AC
  Filled 2014-08-17: qty 2

## 2014-08-17 MED ORDER — BIVALIRUDIN BOLUS VIA INFUSION - CUPID
INTRAVENOUS | Status: DC | PRN
  Administered 2014-08-17: 45.6 mg via INTRAVENOUS

## 2014-08-17 MED ORDER — SODIUM CHLORIDE 0.9 % IV SOLN: INTRAVENOUS | Status: AC

## 2014-08-17 MED ORDER — LIDOCAINE HCL (PF) 1 % IJ SOLN
INTRAMUSCULAR | Status: AC
  Filled 2014-08-17: qty 30

## 2014-08-17 MED ORDER — MIDAZOLAM HCL 2 MG/2ML IJ SOLN
INTRAMUSCULAR | Status: DC | PRN
  Administered 2014-08-17 (×2): 1 mg via INTRAVENOUS

## 2014-08-17 MED ORDER — VERAPAMIL HCL 2.5 MG/ML IV SOLN
INTRAVENOUS | Status: AC
  Filled 2014-08-17: qty 4

## 2014-08-17 MED ORDER — LIDOCAINE HCL (PF) 1 % IJ SOLN
INTRAMUSCULAR | Status: DC | PRN
  Administered 2014-08-17: 16:00:00

## 2014-08-17 MED ORDER — SODIUM CHLORIDE 0.9 % IV SOLN: 250.0000 mL | INTRAVENOUS | Status: DC | PRN

## 2014-08-17 MED ORDER — SODIUM CHLORIDE 0.9 % IJ SOLN
3.0000 mL | Freq: Two times a day (BID) | INTRAMUSCULAR | Status: DC
  Administered 2014-08-19 – 2014-08-21 (×4): 3 mL via INTRAVENOUS

## 2014-08-17 MED ORDER — DOCUSATE SODIUM 100 MG PO CAPS
100.0000 mg | ORAL_CAPSULE | Freq: Two times a day (BID) | ORAL | Status: DC
  Administered 2014-08-17 – 2014-08-21 (×7): 100 mg via ORAL
  Filled 2014-08-17 (×9): qty 1

## 2014-08-17 MED FILL — Nitroglycerin IV Soln 100 MCG/ML in D5W: INTRA_ARTERIAL | Qty: 10 | Status: AC

## 2014-08-17 MED FILL — Lidocaine HCl Local Preservative Free (PF) Inj 1%: INTRAMUSCULAR | Qty: 30 | Status: AC

## 2014-08-17 MED FILL — Heparin Sodium (Porcine) 100 Unt/ML in Sodium Chloride 0.45%: INTRAMUSCULAR | Qty: 250 | Status: AC

## 2014-08-17 SURGICAL SUPPLY — 18 items
ANGIOSEAL 8FR (Vascular Products) ×2 IMPLANT
CATH MICROGUIDE FINCRSS 150 CM (MICROCATHETER) ×1 IMPLANT
CATH S G BIP PACING (SET/KITS/TRAYS/PACK) ×2 IMPLANT
CATH VISTA GUIDE 7FR AL 1 (CATHETERS) ×2 IMPLANT
CATH VISTA GUIDE 7FR AR1 (CATHETERS) ×2 IMPLANT
DEVICE CLOSURE ANGIOSEAL 8FR (Vascular Products) ×1 IMPLANT
KIT ENCORE 26 ADVANTAGE (KITS) ×2 IMPLANT
KIT HEART LEFT (KITS) ×2 IMPLANT
MICROGUIDE FINECROSS 150 CM (MICROCATHETER) ×2
PACK CARDIAC CATHETERIZATION (CUSTOM PROCEDURE TRAY) ×2 IMPLANT
SHEATH PINNACLE 6F 10CM (SHEATH) ×2 IMPLANT
SHEATH PINNACLE 7F 10CM (SHEATH) ×2 IMPLANT
TRANSDUCER W/STOPCOCK (MISCELLANEOUS) ×2 IMPLANT
TUBING CIL FLEX 10 FLL-RA (TUBING) ×2 IMPLANT
WIRE ASAHI FIELDER XT 300CM (WIRE) ×2 IMPLANT
WIRE EMERALD 3MM-J .035X150CM (WIRE) ×2 IMPLANT
WIRE HI TORQ PILOT-200 300CM (WIRE) ×2 IMPLANT
WIRE HI TORQ WHISPER MS 300CM (WIRE) ×2 IMPLANT

## 2014-08-17 NOTE — Progress Notes (Signed)
Site area: left groin, venous sheath  Site Prior to Removal:  Level 0  Pressure Applied For 10 MINUTES    Minutes Beginning at 1855  Manual:   Yes.    Patient Status During Pull:  stable  Post Pull Groin Site:  Level 0  Post Pull Instructions Given:  Yes.    Post Pull Pulses Present:  Yes.    Dressing Applied:  Yes.    Comments:  Dressing applied at 1905. Rechecked at [place] with no change, dressing dry and intact, site soft, no bruising noted. Angioseal site oozing when dressing removed at [place]. After 10 minute hold no oozing noted and site remains soft, with dressing clean and dry.

## 2014-08-17 NOTE — Care Management Important Message (Signed)
Important Message  Patient Details  Name: Sharon Hubbard MRN: [id number] Date of Birth: December 09, 1933   Medicare Important Message Given:  Yes-second notification given    Delorse Lek 08/17/2014, 12:34 PM

## 2014-08-17 NOTE — Progress Notes (Addendum)
ANTICOAGULATION CONSULT NOTE - Follow Up Consult  Pharmacy Consult for Heparin Indication: CAD, s/p cath  Allergies  Allergen Reactions  . Clarithromycin     REACTION: Vomiting and diarrhea.  . Codeine   . Sulfa Antibiotics   . Atorvastatin Other (See Comments)    Made her knees buckle and muscles ache    Patient Measurements: Height: 5\' 3"  (160 cm) Weight: 134 lb 0.6 oz (60.8 kg) IBW/kg (Calculated) : 52.4 Heparin Dosing Weight: 59.5 kg  Vital Signs: Temp: 97.9 F (36.6 C) (07/27 0800) Temp Source: Oral (07/27 0800) BP: 137/46 mmHg (07/27 0800) Pulse Rate: 48 (07/27 0800)  Labs:  Recent Labs  08/16/14 0042 08/16/14 0526 08/16/14 1630 08/17/14 0307 08/17/14 1104  HGB 10.5* 10.6*  --  10.6*  --   HCT 31.5* 31.9*  --  31.9*  --   PLT 102* 110*  --  100*  --   LABPROT  --  14.2  --   --   --   INR  --  1.08  --   --   --   HEPARINUNFRC  --  0.77*  --   --  0.31  CREATININE 1.63* 1.47*  --  1.35*  --   TROPONINI 0.19* 0.24* 0.13*  --   --     Estimated Creatinine Clearance: 28 mL/min (by C-G formula based on Cr of 1.35).   Medications:  Scheduled:  . amLODipine  10 mg Oral Daily  . aspirin EC  81 mg Oral Daily  . clopidogrel  75 mg Oral Daily  . divalproex  500 mg Oral Daily  . insulin aspart  0-15 Units Subcutaneous TID WC  . insulin aspart  0-5 Units Subcutaneous QHS  . isosorbide mononitrate  90 mg Oral QHS  . latanoprost  1 drop Both Eyes QHS  . metoprolol tartrate  12.5 mg Oral BID  . mirabegron ER  25 mg Oral Daily  . pantoprazole  40 mg Oral Daily  . predniSONE  4 mg Oral Q breakfast  . rosuvastatin  20 mg Oral Daily  . sodium chloride  3 mL Intravenous Q12H  . sodium chloride  3 mL Intravenous Q12H  . timolol  1 drop Both Eyes Daily   Infusions:  . sodium chloride    . heparin 700 Units/hr (08/17/14 0402)    Assessment: 81 yo F s/p radial cath 7/26 with CAD requiring staged PCI through groin access planned for today.  Heparin restarted at  700 units/hr on 7/27 at 0400.  HL therapeutic at 0.31. No bleeding noted. CBC stable.  Goal of Therapy:  Heparin level 0.3-0.7 units/ml Monitor platelets by anticoagulation protocol: Yes   Plan:  Continue heparin at 700 units/hr. F/u daily heparin level and CBC F/u after repeat cath 7/27 (scheduled for 1300) Monitor s/sx of bleeding  Joya San, PharmD Clinical Pharmacist Pager # [phone] 08/17/2014 2:01 PM

## 2014-08-17 NOTE — Interval H&P Note (Signed)
History and Physical Interval Note:  08/17/2014 2:16 PM  Sharon Hubbard  has presented today for cardiac cath with the diagnosis of CAD/unstable angina. The various methods of treatment have been discussed with the patient and family. After consideration of risks, benefits and other options for treatment, the patient has consented to  Procedure(s): Coronary/Graft Atherectomy (N/A) as a surgical intervention .  The patient's history has been reviewed, patient examined, no change in status, stable for surgery.  I have reviewed the patient's chart and labs.  Questions were answered to the patient's satisfaction.    Cath Lab Visit (complete for each Cath Lab visit)  Clinical Evaluation Leading to the Procedure:   ACS: Yes.    Non-ACS:    Anginal Classification: CCS III  Anti-ischemic medical therapy: Maximal Therapy (2 or more classes of medications)  Non-Invasive Test Results: No non-invasive testing performed  Prior CABG: No previous CABG         MCALHANY,CHRISTOPHER

## 2014-08-17 NOTE — Progress Notes (Signed)
1010 patient complained of chest pressure 5/10 after being up to [facility] to void. Noted in midsternal right side of chest and consistent with type of pressure that was noted prior to chest and left arm pain that patient has been experiencing at home. 2 liters oxygen applied, stat 12 lead ECG obtained and patient given sl Ntg 0.4 mg at 1020. Patient reported pain resolved after first dose of ntg sl given. Resting quietly at this time, paged PA with this information

## 2014-08-17 NOTE — Progress Notes (Addendum)
CM consulted regarding medication needs. Pt stated has medicare for insurance however is in the "donut hole" currently and would like to seek  assistance with medication. CM provided information on [place] program and explained  process with pt and son Sonia Side). Son stated will help mom with application process. CM to f/u with disposition needs. Whitman Hero RN,BSN,CM [phone]

## 2014-08-17 NOTE — H&P (View-Only) (Signed)
Patient Name: Sharon Hubbard Date of Encounter: 08/17/2014  Principal Problem:   Unstable angina Active Problems:   Type 2 diabetes mellitus treated with insulin   Bradycardia   Aortic stenosis   CKD (chronic kidney disease) stage 3, GFR 30-59 ml/min   Chronic diastolic CHF (congestive heart failure)   CAD S/P percutaneous coronary angioplasty   Primary Cardiologist: Dr Ron Parker  Patient Profile: 81 yo female w/ hx mult PCI, AS, D-CHF, CKD, DM, admitted 07/25 w/ USAP. Cath 07/26 w/ Ca++ 99% RCA, for HSRA 07/27.  SUBJECTIVE: Somewhat dreading the procedure, radial procedure was painful. Has had problems with groin caths as well. But accepts it because the angina was getting more frequent. Pain-free overnight.  OBJECTIVE Filed Vitals:   08/17/14 0039 08/17/14 0200 08/17/14 0600 08/17/14 0634  BP:  108/24 144/54 144/54  Pulse:  51  52  Temp:    97.9 F (36.6 C)  TempSrc:    Oral  Resp:  21  18  Height:      Weight: 134 lb 0.6 oz (60.8 kg)     SpO2:  93%  97%    Intake/Output Summary (Last 24 hours) at 08/17/14 0719 Last data filed at 08/17/14 0644  Gross per 24 hour  Intake  906.4 ml  Output    800 ml  Net  106.4 ml   Filed Weights   08/15/14 2237 08/17/14 0039  Weight: 131 lb 1.6 oz (59.467 kg) 134 lb 0.6 oz (60.8 kg)    PHYSICAL EXAM General: Well developed, well nourished, female in no acute distress. Head: Normocephalic, atraumatic.  Neck: Supple without bruits, JVD not elevated. Lungs:  Resp regular and unlabored, CTA. Heart: RRR, S1, S2, no S3, S4, 2/6 murmur; no rub. Abdomen: Soft, non-tender, non-distended, BS + x 4.  Extremities: No clubbing, cyanosis, edema. R radial site without ecchymosis or hematoma. Neuro: Alert and oriented X 3. Moves all extremities spontaneously. Psych: Normal affect.  LABS: CBC:  Recent Labs  08/16/14 0042 08/16/14 0526 08/17/14 0307  WBC 5.5 5.3 5.0  NEUTROABS 3.2  --   --   HGB 10.5* 10.6* 10.6*  HCT 31.5*  31.9* 31.9*  MCV 91.6 91.7 92.2  PLT 102* 110* 100*   INR:  Recent Labs  08/16/14 0526  INR 3.15   Basic Metabolic Panel:  Recent Labs  08/16/14 0042 08/16/14 0526 08/17/14 0307  NA 139 142 140  K 3.7 3.7 3.8  CL 101 105 106  CO2 27 29 25   GLUCOSE 232* 114* 139*  BUN 24* 22* 21*  CREATININE 1.63* 1.47* 1.35*  CALCIUM 8.7* 8.8* 8.4*  MG 1.7  --   --    Liver Function Tests:  Recent Labs  08/16/14 0042  AST 18  ALT 12*  ALKPHOS 32*  BILITOT 0.6  PROT 5.0*  ALBUMIN 3.0*   Cardiac Enzymes:  Recent Labs  08/16/14 0042 08/16/14 0526 08/16/14 1630  TROPONINI 0.19* 0.24* 0.13*   BNP:  B NATRIURETIC PEPTIDE  Date/Time Value Ref Range Status  08/16/2014 12:42 AM 185.6* 0.0 - 100.0 pg/mL Final   Fasting Lipid Panel:  Recent Labs  08/16/14 0526  CHOL 111  HDL 54  LDLCALC 43  TRIG 70  CHOLHDL 2.1   Thyroid Function Tests:  Recent Labs  08/16/14 0042  TSH 0.782   TELE:   SR, sinus brady down to 30s at times, probably while resting  Cath: 08/16/2014 1. Prox RCA to Mid RCA lesion, 99% stenosed.  Unable to cross with wire. Very difficult guide support. Attempted multiple different catheters, unable to use more than 5 [place] guide from radial access. 2. Widely patent stents in the LAD and circumflex 3. Likely mild aortic stenosis Severe multivessel disease now with progression of RCA disease noted in 2015 from roughly 40% to 99% subtotal occlusion that is heavily calcified and unable to cross with wires from radial access. Recommendations:  Patient was transferred for to [facility] post procedure unit for TR band removal.  Restart IV heparin 6-8 hours post TR band removal.  Planned staged PCI via rotational atherectomy tomorrow  Radiology/Studies: No results found.   Current Medications:  . amLODipine  10 mg Oral Daily  . aspirin EC  81 mg Oral Daily  . clopidogrel  75 mg Oral Daily  . divalproex  500 mg Oral Daily  . insulin aspart  0-15 Units  Subcutaneous TID WC  . insulin aspart  0-5 Units Subcutaneous QHS  . isosorbide mononitrate  90 mg Oral QHS  . latanoprost  1 drop Both Eyes QHS  . metoprolol tartrate  12.5 mg Oral BID  . mirabegron ER  25 mg Oral Daily  . pantoprazole  40 mg Oral Daily  . predniSONE  4 mg Oral Q breakfast  . rosuvastatin  20 mg Oral Daily  . sodium chloride  3 mL Intravenous Q12H  . sodium chloride  3 mL Intravenous Q12H  . timolol  1 drop Both Eyes Daily   . sodium chloride    . heparin 700 Units/hr (08/17/14 0402)    ASSESSMENT AND PLAN: Principal Problem:   Unstable angina - for HSRA today, this pm - clear liquids today - continue ASA, statin, low-dose BB, Plavix - will ck P2Y12 in am - with PCI, MD advise on decreasing Imdur  Active Problems:   Type 2 diabetes mellitus treated with insulin - home rx held, on SSI    Bradycardia - sinus, no afib, BB is new, no doses held    Aortic stenosis - follow    CKD (chronic kidney disease) stage 3, GFR 30-59 ml/min - Cr improving w/ hydration and Lasix on hold - now below baseline of 1.4    Chronic diastolic CHF (congestive heart failure) - wt up 3 lbs, no sx overload, continue to follow    CAD S/P percutaneous coronary angioplasty - see above   Plan: possible d/c in am  Signed, Barrett, Suanne Marker , PA-C 7:19 AM 08/17/2014   I have examined the patient and reviewed assessment and plan and discussed with patient.  Agree with above as stated.  On schedule for complex PCI of RCA with rotational atherectomy.  Prior groin complications.    Tacari Repass S.

## 2014-08-17 NOTE — Progress Notes (Signed)
Patient Name: Sharon Hubbard Date of Encounter: 08/17/2014  Principal Problem:   Unstable angina Active Problems:   Type 2 diabetes mellitus treated with insulin   Bradycardia   Aortic stenosis   CKD (chronic kidney disease) stage 3, GFR 30-59 ml/min   Chronic diastolic CHF (congestive heart failure)   CAD S/P percutaneous coronary angioplasty   Primary Cardiologist: Dr Sharon Hubbard  Patient Profile: 81 yo female w/ hx mult PCI, AS, D-CHF, CKD, DM, admitted 07/25 w/ USAP. Cath 07/26 w/ Ca++ 99% RCA, for HSRA 07/27.  SUBJECTIVE: Somewhat dreading the procedure, radial procedure was painful. Has had problems with groin caths as well. But accepts it because the angina was getting more frequent. Pain-free overnight.  OBJECTIVE Filed Vitals:   08/17/14 0039 08/17/14 0200 08/17/14 0600 08/17/14 0634  BP:  108/24 144/54 144/54  Pulse:  51  52  Temp:    97.9 F (36.6 C)  TempSrc:    Oral  Resp:  21  18  Height:      Weight: 134 lb 0.6 oz (60.8 kg)     SpO2:  93%  97%    Intake/Output Summary (Last 24 hours) at 08/17/14 0719 Last data filed at 08/17/14 0644  Gross per 24 hour  Intake  906.4 ml  Output    800 ml  Net  106.4 ml   Filed Weights   08/15/14 2237 08/17/14 0039  Weight: 131 lb 1.6 oz (59.467 kg) 134 lb 0.6 oz (60.8 kg)    PHYSICAL EXAM General: Well developed, well nourished, female in no acute distress. Head: Normocephalic, atraumatic.  Neck: Supple without bruits, JVD not elevated. Lungs:  Resp regular and unlabored, CTA. Heart: RRR, S1, S2, no S3, S4, 2/6 murmur; no rub. Abdomen: Soft, non-tender, non-distended, BS + x 4.  Extremities: No clubbing, cyanosis, edema. R radial site without ecchymosis or hematoma. Neuro: Alert and oriented X 3. Moves all extremities spontaneously. Psych: Normal affect.  LABS: CBC:  Recent Labs  08/16/14 0042 08/16/14 0526 08/17/14 0307  WBC 5.5 5.3 5.0  NEUTROABS 3.2  --   --   HGB 10.5* 10.6* 10.6*  HCT 31.5*  31.9* 31.9*  MCV 91.6 91.7 92.2  PLT 102* 110* 100*   INR:  Recent Labs  08/16/14 0526  INR 1.61   Basic Metabolic Panel:  Recent Labs  08/16/14 0042 08/16/14 0526 08/17/14 0307  NA 139 142 140  K 3.7 3.7 3.8  CL 101 105 106  CO2 27 29 25   GLUCOSE 232* 114* 139*  BUN 24* 22* 21*  CREATININE 1.63* 1.47* 1.35*  CALCIUM 8.7* 8.8* 8.4*  MG 1.7  --   --    Liver Function Tests:  Recent Labs  08/16/14 0042  AST 18  ALT 12*  ALKPHOS 32*  BILITOT 0.6  PROT 5.0*  ALBUMIN 3.0*   Cardiac Enzymes:  Recent Labs  08/16/14 0042 08/16/14 0526 08/16/14 1630  TROPONINI 0.19* 0.24* 0.13*   BNP:  B NATRIURETIC PEPTIDE  Date/Time Value Ref Range Status  08/16/2014 12:42 AM 185.6* 0.0 - 100.0 pg/mL Final   Fasting Lipid Panel:  Recent Labs  08/16/14 0526  CHOL 111  HDL 54  LDLCALC 43  TRIG 70  CHOLHDL 2.1   Thyroid Function Tests:  Recent Labs  08/16/14 0042  TSH 0.782   TELE:   SR, sinus brady down to 30s at times, probably while resting  Cath: 08/16/2014 1. Prox RCA to Mid RCA lesion, 99% stenosed.  Unable to cross with wire. Very difficult guide support. Attempted multiple different catheters, unable to use more than 5 [place] guide from radial access. 2. Widely patent stents in the LAD and circumflex 3. Likely mild aortic stenosis Severe multivessel disease now with progression of RCA disease noted in 2015 from roughly 40% to 99% subtotal occlusion that is heavily calcified and unable to cross with wires from radial access. Recommendations:  Patient was transferred for to [facility] post procedure unit for TR band removal.  Restart IV heparin 6-8 hours post TR band removal.  Planned staged PCI via rotational atherectomy tomorrow  Radiology/Studies: No results found.   Current Medications:  . amLODipine  10 mg Oral Daily  . aspirin EC  81 mg Oral Daily  . clopidogrel  75 mg Oral Daily  . divalproex  500 mg Oral Daily  . insulin aspart  0-15 Units  Subcutaneous TID WC  . insulin aspart  0-5 Units Subcutaneous QHS  . isosorbide mononitrate  90 mg Oral QHS  . latanoprost  1 drop Both Eyes QHS  . metoprolol tartrate  12.5 mg Oral BID  . mirabegron ER  25 mg Oral Daily  . pantoprazole  40 mg Oral Daily  . predniSONE  4 mg Oral Q breakfast  . rosuvastatin  20 mg Oral Daily  . sodium chloride  3 mL Intravenous Q12H  . sodium chloride  3 mL Intravenous Q12H  . timolol  1 drop Both Eyes Daily   . sodium chloride    . heparin 700 Units/hr (08/17/14 0402)    ASSESSMENT AND PLAN: Principal Problem:   Unstable angina - for HSRA today, this pm - clear liquids today - continue ASA, statin, low-dose BB, Plavix - will ck P2Y12 in am - with PCI, MD advise on decreasing Imdur  Active Problems:   Type 2 diabetes mellitus treated with insulin - home rx held, on SSI    Bradycardia - sinus, no afib, BB is new, no doses held    Aortic stenosis - follow    CKD (chronic kidney disease) stage 3, GFR 30-59 ml/min - Cr improving w/ hydration and Lasix on hold - now below baseline of 1.4    Chronic diastolic CHF (congestive heart failure) - wt up 3 lbs, no sx overload, continue to follow    CAD S/P percutaneous coronary angioplasty - see above   Plan: possible d/c in am  Signed, Barrett, Suanne Marker , PA-C 7:19 AM 08/17/2014   I have examined the patient and reviewed assessment and plan and discussed with patient.  Agree with above as stated.  On schedule for complex PCI of RCA with rotational atherectomy.  Prior groin complications.    [name]

## 2014-08-18 ENCOUNTER — Encounter (HOSPITAL_COMMUNITY): Payer: Self-pay | Admitting: Cardiovascular Disease

## 2014-08-18 ENCOUNTER — Other Ambulatory Visit: Payer: Self-pay | Admitting: *Deleted

## 2014-08-18 DIAGNOSIS — I2510 Atherosclerotic heart disease of native coronary artery without angina pectoris: Secondary | ICD-10-CM

## 2014-08-18 DIAGNOSIS — I25110 Atherosclerotic heart disease of native coronary artery with unstable angina pectoris: Secondary | ICD-10-CM

## 2014-08-18 DIAGNOSIS — Z9861 Coronary angioplasty status: Secondary | ICD-10-CM

## 2014-08-18 LAB — BASIC METABOLIC PANEL
Anion gap: 7 (ref 5–15)
BUN: 16 mg/dL (ref 6–20)
CO2: 28 mmol/L (ref 22–32)
Calcium: 8.9 mg/dL (ref 8.9–10.3)
Chloride: 105 mmol/L (ref 101–111)
Creatinine, Ser: 1.31 mg/dL — ABNORMAL HIGH (ref 0.44–1.00)
GFR calc Af Amer: 44 mL/min — ABNORMAL LOW (ref >60)
GFR, EST NON AFRICAN AMERICAN: 38 mL/min — AB (ref >60)
GLUCOSE: 129 mg/dL — AB (ref 65–99)
Potassium: 3.9 mmol/L (ref 3.5–5.1)
SODIUM: 140 mmol/L (ref 135–145)

## 2014-08-18 LAB — CBC
HCT: 31.4 % — ABNORMAL LOW (ref 36.0–46.0)
Hemoglobin: 10.5 g/dL — ABNORMAL LOW (ref 12.0–15.0)
MCH: 30.7 pg (ref 26.0–34.0)
MCHC: 33.4 g/dL (ref 30.0–36.0)
MCV: 91.8 fL (ref 78.0–100.0)
PLATELETS: 99 10*3/uL — AB (ref 150–400)
RBC: 3.42 MIL/uL — ABNORMAL LOW (ref 3.87–5.11)
RDW: 15 % (ref 11.5–15.5)
WBC: 6.6 10*3/uL (ref 4.0–10.5)

## 2014-08-18 LAB — GLUCOSE, CAPILLARY
GLUCOSE-CAPILLARY: 155 mg/dL — AB (ref 65–99)
Glucose-Capillary: 127 mg/dL — ABNORMAL HIGH (ref 65–99)
Glucose-Capillary: 181 mg/dL — ABNORMAL HIGH (ref 65–99)
Glucose-Capillary: 210 mg/dL — ABNORMAL HIGH (ref 65–99)

## 2014-08-18 LAB — PLATELET INHIBITION P2Y12: PLATELET FUNCTION P2Y12: 233 [PRU] (ref 194–418)

## 2014-08-18 MED ORDER — HEPARIN (PORCINE) IN NACL 100-0.45 UNIT/ML-% IJ SOLN
700.0000 [IU]/h | INTRAMUSCULAR | Status: DC
  Administered 2014-08-18 – 2014-08-21 (×3): 700 [IU]/h via INTRAVENOUS
  Filled 2014-08-18 (×5): qty 250

## 2014-08-18 NOTE — Progress Notes (Signed)
Patient requesting medications to aid in BM, colace and milk of mag administered during evening shift with two BM's noted, patient feeling much better, will continue regimen.

## 2014-08-18 NOTE — Consult Note (Signed)
Reason for Consult:Single vessel CAD with unstable angina Referring Physician: Dr. Greig Castilla  Sharon Hubbard is an 81 y.o. female.  HPI: 81 yo woman who presents with a cc/o chest pain  Sharon Hubbard is a 81 yo woman with a known history od CAN. She had a stent placed in her circumflex in 2011 and a DES placed in the LAD in 2012. Her past history is also significant for moderate AS, diastolic CHF, type II diabetes, stage III CKD, spinal stenosis, hypertension, steroid dependent polymyalgia rheumatica, and TIAs.   She has been having substernal chest pain radiating to her left arm for the past 6-8 weeks. She has noticed this occurs several times a week and has been getting more frequent leading up to admission. It sometimes occurs at rest. It usually resolves with one SL NTG but sometimes it takes 2 of those. On the day of admission she had a severe episode that did not resolve and called EMS. They gave her 2 more NTG and the pain finally resolved after about 45 minutes.  She was taken to [facility]. Her initial troponin was negative, but he ECG showed anterolateral ST depression. She ruled out for MI.  She lives at home with her husband. She is relatively active doing cooking and cleaning as well as going to [employer] on a regular basis. She had noted swelling in her legs a couple of weeks ago but that improved with increasing lasix to 60 mg/day.  Her creatinine was elevated on admission at 1.75 but now is back to her baseline 1.4.  She had 2 attempts to attempt angioplasty on the vessel but in both cases a wire could not be passed.   Past Medical History  Diagnosis Date  . CAD (coronary artery disease)     Interventions in the past  . Groin hematoma     April, 2011  . Hypertension   . Diabetes mellitus   . Dyslipidemia   . GERD (gastroesophageal reflux disease)   . Schatzki's ring   . Renal insufficiency   . Polymyalgia rheumatica   . Osteoarthritis     arthroscopic surgery  right knee February, 2012  . Spinal stenosis     history of surgery for this  . Ejection fraction     EF 65%, echo, April, 2012, aortic valve sclerosis with very slight gradient  . Preoperative evaluation to rule out surgical contraindication     Patient needs back surgery by Dr. Carloyn Manner, May, 2012                . Pancreas cyst     The patient has multiple pancreatic cysts.  This is followed at [facility]         . Complication of anesthesia     "felt like I was smothering once with mask on my face"  . RBBB (right bundle branch block)     old  . Bradycardia, sinus   . Myocardial infarction 2011; 08/2010  . Shortness of breath on exertion   . Blood transfusion   . Anemia   . Stroke 01/18/11    "I've had 2 TIA's"  . Cancer     ""had hysterectomy for a 6 showing cancer; think that's pretty strong  . Aortic stenosis     very mild, echo 12/2010  . Femoral bruit     01/2011 hosp, but no pseudoaneurysm or AV fistula    Past Surgical History  Procedure Laterality Date  . Cholecystectomy  1987  .  Appendectomy    . Back surgery    . Lumbar spine surgery  ~ 1977; ~ 2010    "2 hugh ruptured discs; I was paralyzed first OR; 2nd OR by Dr. Carloyn Manner, don't know what for"  . Tubal ligation  1970's  . Dilation and curettage of uterus    . Abdominal hysterectomy  ~ 1974  . Knee arthroscopy  02/2010    right knee; chrondoplasty medial femoral condyle;  Partial medial meniscectomy.   . Coronary angioplasty with stent placement  2011    "in [place]"  . Coronary angioplasty with stent placement  08/2010; 03/25/2013  . Cardiac catheterization  01/18/11  . Femoral artery exploration Right 03/26/2013    Procedure: FEMORAL ARTERY EXPLORATION WITH SUTURE REPAIR; EVACUATION OF HEMATOMA;  Surgeon: Mal Misty, MD;  Location: [place];  Service: Vascular;  Laterality: Right;  . Coronary angiogram  01/18/2011    Procedure: CORONARY ANGIOGRAM;  Surgeon: Larey Dresser, MD;  Location: [facility] CATH LAB;  Service:  Cardiovascular;;  . Left and right heart catheterization with coronary angiogram N/A 03/25/2013    Procedure: LEFT AND RIGHT HEART CATHETERIZATION WITH CORONARY ANGIOGRAM;  Surgeon: Burnell Blanks, MD;  Location: [facility] CATH LAB;  Service: Cardiovascular;  Laterality: N/A;  . Cardiac catheterization N/A 08/16/2014    Procedure: Left Heart Cath and Coronary Angiography;  Surgeon: Leonie Man, MD;  Location: [place] CV LAB;  Service: Cardiovascular;  Laterality: N/A;  . Cardiac catheterization N/A 08/16/2014    Procedure: Coronary Stent Intervention;  Surgeon: Leonie Man, MD;  Location: [place] CV LAB;  Service: Cardiovascular;  Laterality: N/A;  . Cardiac catheterization N/A 08/17/2014    Procedure: Coronary/Graft Atherectomy;  Surgeon: Burnell Blanks, MD;  Location: [place] CV LAB;  Service: Cardiovascular;  Laterality: N/A;    Family History  Problem Relation Age of Onset  . Uterine cancer Mother 3  . Other Father 79    cervical fracture    Social History:  reports that she has never smoked. She has never used smokeless tobacco. She reports that she does not drink alcohol or use illicit drugs.  Allergies:  Allergies  Allergen Reactions  . Clarithromycin     REACTION: Vomiting and diarrhea.  . Codeine   . Sulfa Antibiotics   . Atorvastatin Other (See Comments)    Made her knees buckle and muscles ache    Medications:  Scheduled: . amLODipine  10 mg Oral Daily  . aspirin EC  81 mg Oral Daily  . divalproex  500 mg Oral Daily  . docusate sodium  100 mg Oral BID  . insulin aspart  0-15 Units Subcutaneous TID WC  . insulin aspart  0-5 Units Subcutaneous QHS  . isosorbide mononitrate  90 mg Oral QHS  . latanoprost  1 drop Both Eyes QHS  . mirabegron ER  25 mg Oral Daily  . pantoprazole  40 mg Oral Daily  . predniSONE  4 mg Oral Q breakfast  . rosuvastatin  20 mg Oral Daily  . sodium chloride  3 mL Intravenous Q12H  . sodium chloride  3 mL  Intravenous Q12H  . timolol  1 drop Both Eyes Daily    Results for orders placed or performed during the hospital encounter of 08/15/14 (from the past 48 hour(s))  Glucose, capillary     Status: Abnormal   Collection Time: 08/16/14  3:08 PM  Result Value Ref Range   Glucose-Capillary 170 (H) 65 - 99 mg/dL  Troponin I     Status: Abnormal   Collection Time: 08/16/14  4:30 PM  Result Value Ref Range   Troponin I 0.13 (H) <0.031 ng/mL    Comment:        PERSISTENTLY INCREASED TROPONIN VALUES IN THE RANGE OF 0.04-0.49 ng/mL CAN BE SEEN IN:       -UNSTABLE ANGINA       -CONGESTIVE HEART FAILURE       -MYOCARDITIS       -CHEST TRAUMA       -ARRYHTHMIAS       -LATE PRESENTING MYOCARDIAL INFARCTION       -COPD   CLINICAL FOLLOW-UP RECOMMENDED.   Glucose, capillary     Status: Abnormal   Collection Time: 08/16/14  5:18 PM  Result Value Ref Range   Glucose-Capillary 294 (H) 65 - 99 mg/dL  Glucose, capillary     Status: Abnormal   Collection Time: 08/16/14  9:47 PM  Result Value Ref Range   Glucose-Capillary 178 (H) 65 - 99 mg/dL   Comment 1 Notify RN    Comment 2 Document in Chart   Basic metabolic panel     Status: Abnormal   Collection Time: 08/17/14  3:07 AM  Result Value Ref Range   Sodium 140 135 - 145 mmol/L   Potassium 3.8 3.5 - 5.1 mmol/L   Chloride 106 101 - 111 mmol/L   CO2 25 22 - 32 mmol/L   Glucose, Bld 139 (H) 65 - 99 mg/dL   BUN 21 (H) 6 - 20 mg/dL   Creatinine, Ser 1.35 (H) 0.44 - 1.00 mg/dL   Calcium 8.4 (L) 8.9 - 10.3 mg/dL   GFR calc non Af Amer 36 (L) >60 mL/min   GFR calc Af Amer 42 (L) >60 mL/min    Comment: (NOTE) The eGFR has been calculated using the CKD EPI equation. This calculation has not been validated in all clinical situations. eGFR's persistently <60 mL/min signify possible Chronic Kidney Disease.    Anion gap 9 5 - 15  CBC     Status: Abnormal   Collection Time: 08/17/14  3:07 AM  Result Value Ref Range   WBC 5.0 4.0 - 10.5 K/uL    RBC 3.46 (L) 3.87 - 5.11 MIL/uL   Hemoglobin 10.6 (L) 12.0 - 15.0 g/dL   HCT 31.9 (L) 36.0 - 46.0 %   MCV 92.2 78.0 - 100.0 fL   MCH 30.6 26.0 - 34.0 pg   MCHC 33.2 30.0 - 36.0 g/dL   RDW 15.1 11.5 - 15.5 %   Platelets 100 (L) 150 - 400 K/uL    Comment: CONSISTENT WITH PREVIOUS RESULT  Glucose, capillary     Status: Abnormal   Collection Time: 08/17/14  6:48 AM  Result Value Ref Range   Glucose-Capillary 117 (H) 65 - 99 mg/dL   Comment 1 Notify RN    Comment 2 Document in Chart   Heparin level (unfractionated)     Status: None   Collection Time: 08/17/14 11:04 AM  Result Value Ref Range   Heparin Unfractionated 0.31 0.30 - 0.70 IU/mL    Comment:        IF HEPARIN RESULTS ARE BELOW EXPECTED VALUES, AND PATIENT DOSAGE HAS BEEN CONFIRMED, SUGGEST FOLLOW UP TESTING OF ANTITHROMBIN III LEVELS.   Glucose, capillary     Status: Abnormal   Collection Time: 08/17/14  1:28 PM  Result Value Ref Range   Glucose-Capillary 155 (H) 65 - 99 mg/dL  POCT Activated  clotting time     Status: None   Collection Time: 08/17/14  2:45 PM  Result Value Ref Range   Activated Clotting Time 343 seconds  Glucose, capillary     Status: Abnormal   Collection Time: 08/17/14  6:12 PM  Result Value Ref Range   Glucose-Capillary 158 (H) 65 - 99 mg/dL  Glucose, capillary     Status: Abnormal   Collection Time: 08/17/14  9:55 PM  Result Value Ref Range   Glucose-Capillary 185 (H) 65 - 99 mg/dL   Comment 1 Notify RN    Comment 2 Document in Chart   Basic metabolic panel     Status: Abnormal   Collection Time: 08/18/14  2:58 AM  Result Value Ref Range   Sodium 140 135 - 145 mmol/L   Potassium 3.9 3.5 - 5.1 mmol/L   Chloride 105 101 - 111 mmol/L   CO2 28 22 - 32 mmol/L   Glucose, Bld 129 (H) 65 - 99 mg/dL   BUN 16 6 - 20 mg/dL   Creatinine, Ser 1.31 (H) 0.44 - 1.00 mg/dL   Calcium 8.9 8.9 - 10.3 mg/dL   GFR calc non Af Amer 38 (L) >60 mL/min   GFR calc Af Amer 44 (L) >60 mL/min    Comment:  (NOTE) The eGFR has been calculated using the CKD EPI equation. This calculation has not been validated in all clinical situations. eGFR's persistently <60 mL/min signify possible Chronic Kidney Disease.    Anion gap 7 5 - 15  Platelet inhibition p2y12     Status: None   Collection Time: 08/18/14  2:58 AM  Result Value Ref Range   Platelet Function  P2Y12 233 194 - 418 PRU  CBC     Status: Abnormal   Collection Time: 08/18/14  2:58 AM  Result Value Ref Range   WBC 6.6 4.0 - 10.5 K/uL   RBC 3.42 (L) 3.87 - 5.11 MIL/uL   Hemoglobin 10.5 (L) 12.0 - 15.0 g/dL   HCT 31.4 (L) 36.0 - 46.0 %   MCV 91.8 78.0 - 100.0 fL   MCH 30.7 26.0 - 34.0 pg   MCHC 33.4 30.0 - 36.0 g/dL   RDW 15.0 11.5 - 15.5 %   Platelets 99 (L) 150 - 400 K/uL    Comment: CONSISTENT WITH PREVIOUS RESULT  Glucose, capillary     Status: Abnormal   Collection Time: 08/18/14  6:17 AM  Result Value Ref Range   Glucose-Capillary 127 (H) 65 - 99 mg/dL  Glucose, capillary     Status: Abnormal   Collection Time: 08/18/14 12:14 PM  Result Value Ref Range   Glucose-Capillary 181 (H) 65 - 99 mg/dL    No results found.  Review of Systems  Constitutional: Positive for malaise/fatigue. Negative for fever and chills.  Respiratory: Positive for shortness of breath. Negative for hemoptysis and wheezing.   Cardiovascular: Positive for chest pain and leg swelling. Negative for claudication.  Gastrointestinal: Positive for heartburn. Negative for blood in stool.       Difficulty swallowing  Genitourinary: Negative for hematuria.  Musculoskeletal: Positive for myalgias and joint pain.  Neurological: Negative for speech change and focal weakness.  Endo/Heme/Allergies: Bruises/bleeds easily.  All other systems reviewed and are negative.  Blood pressure 119/54, pulse 70, temperature 98 F (36.7 C), temperature source Oral, resp. rate 14, height '5\' 3"'  (1.6 m), weight 135 lb 12.9 oz (61.6 kg), SpO2 97 %. Physical Exam  Vitals  reviewed. Constitutional: She is oriented  to person, place, and time. She appears well-developed and well-nourished. No distress.  Elderly  HENT:  Head: Normocephalic and atraumatic.  Eyes: Conjunctivae and EOM are normal. No scleral icterus.  Neck: Neck supple. No tracheal deviation present. No thyromegaly present.  Transmitted murmur bilaterally  Cardiovascular: Normal rate and regular rhythm.   Murmur (2/6 crescendo/ decrescendo RUSB) heard. Unable to palpate DP or PT  Respiratory: Effort normal and breath sounds normal. She has no wheezes. She has no rales.  GI: Soft. She exhibits no distension. There is no tenderness.  Musculoskeletal: Normal range of motion. She exhibits no edema.  Lymphadenopathy:    She has no cervical adenopathy.  Neurological: She is alert and oriented to person, place, and time. No cranial nerve deficit. She exhibits normal muscle tone. Coordination normal.  Skin: Skin is warm and dry.  Psychiatric: She has a normal mood and affect.   CARDIAC CATH 08/16/14 Conclusion    1. Prox RCA to Mid RCA lesion, 99% stenosed. Unable to cross with wire. Very difficult guide support. Attempted multiple different catheters, unable to use more than 5 [place] guide from radial access. 2. Widely patent stents in the LAD and circumflex 3. Likely mild aortic stenosis  Severe multivessel disease now with progression of RCA disease noted in 2015 from roughly 40% to 99% subtotal occlusion that is heavily calcified and unable to cross with wires from radial access.  Recommendations:  Patient was transferred for to [facility] post procedure unit for TR band removal.  Restart IV heparin 6-8 hours post TR band removal.  Planned staged PCI via rotational atherectomy tomorrow   I spent close to 20 minutes explaining the procedure results and future plans to the patient's son.   Leonie Man, M.D., M.S.   I personally reviewed the cath films from 7/26 and 7/27 and concur with the  findings above. In addition she has significant egg shell calcification of her proximal ascending aorta, which is also visible on the abdominal CT from several years ago.  Assessment/Plan: 81 yo woman with severe single vessel CAD and unstable angina. She also has moderate AS by echo in January.   Unfortunately the lesion could not be stented with failure to pass a wire on 2 attempts. The options for treatment are medical therapy v CABG (RIMA to RCA). Neither option is ideal, but CABG is more likely to result in symptom relief, as she was already on a good medical regimen prior to admission. Consideration could be given to [place] if she opt for medical therapy.  She has multiple factors that increase her risk for surgery- advanced age, CKD, chronic steroids, DM, history of TIA, and ascending aortic atherosclerotic disease (egg shell calcification).  I had a long discussion with Mrs. Dalby and her family re: CABG. There is no survival benefit in this setting, so the indication is for relief of symptoms. I think there is a 90% chance of success with that.  I discussed the general nature of the procedure, the need for general anesthesia, the use of cardiopulmonary bypass, and the incisions to be used. We discussed the expected hospital stay, overall recovery and short and long term outcomes. I reviewed the indications, risks, benefits and alternatives. They understand the risks include, but are not limited to death, stroke, MI, DVT/PE, bleeding, possible need for transfusion, infections,other organ system dysfunction including respiratory, renal, or GI complications. She is at high risk for bleeding, renal failure, infection or other wound problems and neurologic complications  She  understands the risks and wishes to talk things over with her family.  She needs a repeat echo as the most recent was in January. She also need carotid duplex.  Her P2Y12 was 233 but given her age and steroid use I think  she needs to be off plavix a minimum of 4 days prior to surgery. I would prefer 5-7 days but need to weigh the risk given she has a DES in the LAD.  Melrose Nakayama 08/18/2014, 1:32 PM

## 2014-08-18 NOTE — Progress Notes (Signed)
Patient: Sharon Hubbard / Admit Date: 08/15/2014 / Date of Encounter: 08/18/2014, 7:59 AM   Subjective: No complaints. No CP or SOB.   Objective: Telemetry: NSR/SB. Yesterday around 1454 had about a minute worth of HR intermittently in the 30s,  Physical Exam: Blood pressure 130/46, pulse 61, temperature 98 F (36.7 C), temperature source Oral, resp. rate 20, height 5\' 3"  (1.6 m), weight 135 lb 12.9 oz (61.6 kg), SpO2 97 %. General: Well developed, well nourished WF in no acute distress. Lying flat in bed Head: Normocephalic, atraumatic, sclera non-icteric, no xanthomas, nares are without discharge. Neck: JVP not elevated. Lungs: Clear bilaterally to auscultation without wheezes, rales, or rhonchi. Breathing is unlabored. Heart: RRR S1 S2, soft SEM RUSB. Abdomen: Soft, non-tender, non-distended with normoactive bowel sounds. No rebound/guarding. Extremities: No clubbing or cyanosis. No edema. Distal pedal pulses are 2+ and equal bilaterally. Left groin cath site without hematoma, ecchymosis or bruit Neuro: Alert and oriented X 3. Moves all extremities spontaneously. Psych:  Responds to questions appropriately with a normal affect.   Intake/Output Summary (Last 24 hours) at 08/18/14 0759 Last data filed at 08/18/14 0017  Gross per 24 hour  Intake  522.5 ml  Output   2200 ml  Net -1677.5 ml    Inpatient Medications:  . amLODipine  10 mg Oral Daily  . aspirin EC  81 mg Oral Daily  . clopidogrel  75 mg Oral Daily  . divalproex  500 mg Oral Daily  . docusate sodium  100 mg Oral BID  . insulin aspart  0-15 Units Subcutaneous TID WC  . insulin aspart  0-5 Units Subcutaneous QHS  . isosorbide mononitrate  90 mg Oral QHS  . latanoprost  1 drop Both Eyes QHS  . metoprolol tartrate  12.5 mg Oral BID  . mirabegron ER  25 mg Oral Daily  . pantoprazole  40 mg Oral Daily  . predniSONE  4 mg Oral Q breakfast  . rosuvastatin  20 mg Oral Daily  . sodium chloride  3 mL Intravenous Q12H   . sodium chloride  3 mL Intravenous Q12H  . timolol  1 drop Both Eyes Daily   Infusions:    Labs:  Recent Labs  08/16/14 0042  08/17/14 0307 08/18/14 0258  NA 139  < > 140 140  K 3.7  < > 3.8 3.9  CL 101  < > 106 105  CO2 27  < > 25 28  GLUCOSE 232*  < > 139* 129*  BUN 24*  < > 21* 16  CREATININE 1.63*  < > 1.35* 1.31*  CALCIUM 8.7*  < > 8.4* 8.9  MG 1.7  --   --   --   < > = values in this interval not displayed.  Recent Labs  08/16/14 0042  AST 18  ALT 12*  ALKPHOS 32*  BILITOT 0.6  PROT 5.0*  ALBUMIN 3.0*    Recent Labs  08/16/14 0042  08/17/14 0307 08/18/14 0258  WBC 5.5  < > 5.0 6.6  NEUTROABS 3.2  --   --   --   HGB 10.5*  < > 10.6* 10.5*  HCT 31.5*  < > 31.9* 31.4*  MCV 91.6  < > 92.2 91.8  PLT 102*  < > 100* 99*  < > = values in this interval not displayed.  Recent Labs  08/16/14 0042 08/16/14 0526 08/16/14 1630  TROPONINI 0.19* 0.24* 0.13*   Invalid input(s): POCBNP No results for input(s): HGBA1C in  the last 72 hours.   Radiology/Studies:  No results found.   Assessment and Plan  1. CAD, admitted with NSTEMI - prior hx of multiple stents including most recently DES to pLAD in 03/2013. This admission had 99% mRCA lesion with initial plan for HSRA. However, the lesion was unable to be crossed yesterday. Dr. Irish Lack to see to comment regarding CTO PCI versus consideration of CABG. Felt to be less ideal candidate per cath note for CABG given chronic steroid use for PMR. Note P2Y12 is 233, indicating possible plavix resistance. Not a great candidate for Effient given advanced age and weight right around 60kg. Will discuss plan with MD.  2. Chronic diastolic CHF - outpatient weights vary 133-138. Lasix and lisinopril are on hold peri-cath until decision is made regarding #1.  3. CKD stage III - Cr stable post-cath.  4. Sinus bradycardia - had an episode of sinus bradycardia with HRs in the 30s yesterday afternoon during daytime hours. Slightly  irregular but P waves still seen. No high grade heart block. Low dose BB was new this admission - will discontinue.  5. AS - mod by echo 01/2014,  6. Mild anemia/thrombocytopenia - appears chronic. Pt denies any signs bleeding.  Signed, Melina Copa PA-C Pager: [phone]   I have examined the patient and reviewed assessment and plan and discussed with patient.  Agree with above as stated.  No other PCI options.  Will obtain cardiac surgery consult to evaluate for possible CABG with possible aortic valve replacement.  Will have to restart heparin I for now.  P2Y12 reading above target.  I personally reviewed the cath films.  THere are no collaterals from the left side.  Findings and rationale were discussed with the family at length.  Angioseal on the left groin. No signs of bleeding.   Sharon Ballentine S.

## 2014-08-18 NOTE — Progress Notes (Signed)
ANTICOAGULATION CONSULT NOTE - Follow Up Consult  Pharmacy Consult for Heparin Indication: CAD, s/p cath  Allergies  Allergen Reactions  . Clarithromycin     REACTION: Vomiting and diarrhea.  . Codeine   . Sulfa Antibiotics   . Atorvastatin Other (See Comments)    Made her knees buckle and muscles ache    Patient Measurements: Height: 5\' 3"  (160 cm) Weight: 135 lb 12.9 oz (61.6 kg) IBW/kg (Calculated) : 52.4 Heparin Dosing Weight: 59.5 kg  Vital Signs: Temp: 98 F (36.7 C) (07/28 0819) Temp Source: Oral (07/28 0819) BP: 119/54 mmHg (07/28 0819) Pulse Rate: 70 (07/28 0819)  Labs:  Recent Labs  08/16/14 0042 08/16/14 0526 08/16/14 1630 08/17/14 0307 08/17/14 1104 08/18/14 0258  HGB 10.5* 10.6*  --  10.6*  --  10.5*  HCT 31.5* 31.9*  --  31.9*  --  31.4*  PLT 102* 110*  --  100*  --  99*  LABPROT  --  14.2  --   --   --   --   INR  --  1.08  --   --   --   --   HEPARINUNFRC  --  0.77*  --   --  0.31  --   CREATININE 1.63* 1.47*  --  1.35*  --  1.31*  TROPONINI 0.19* 0.24* 0.13*  --   --   --     Estimated Creatinine Clearance: 28.8 mL/min (by C-G formula based on Cr of 1.31).   Medications:  Scheduled:  . amLODipine  10 mg Oral Daily  . aspirin EC  81 mg Oral Daily  . divalproex  500 mg Oral Daily  . docusate sodium  100 mg Oral BID  . insulin aspart  0-15 Units Subcutaneous TID WC  . insulin aspart  0-5 Units Subcutaneous QHS  . isosorbide mononitrate  90 mg Oral QHS  . latanoprost  1 drop Both Eyes QHS  . mirabegron ER  25 mg Oral Daily  . pantoprazole  40 mg Oral Daily  . predniSONE  4 mg Oral Q breakfast  . rosuvastatin  20 mg Oral Daily  . sodium chloride  3 mL Intravenous Q12H  . sodium chloride  3 mL Intravenous Q12H  . timolol  1 drop Both Eyes Daily   Assessment: 81yoF admitted 08/15/2014 transferred from [facility] with chest pain and EKG changes.  H/H 10.5/31.4 stable, Plt 99, No bleeding reported at this time (Hx of groin bleeds x2 in past  during cath).   7/26 PM: S/p cath heparin restarted after TR band removal.  7/27: PCI attempted but aborted 7/28: S/p cath, restarting heparin while consulting TCTS for possible AVR  Goal of Therapy:  Heparin level 0.3-0.7 units/ml Monitor platelets by anticoagulation protocol: Yes   Plan:  Restart heparin at 700 units/hr. 2000 HL F/u daily heparin level and CBC Monitor s/sx of bleeding  Joya San, PharmD Clinical Pharmacist Pager # [phone] 08/18/2014 11:15 AM

## 2014-08-19 ENCOUNTER — Inpatient Hospital Stay (HOSPITAL_COMMUNITY): Payer: Medicare Other

## 2014-08-19 DIAGNOSIS — I214 Non-ST elevation (NSTEMI) myocardial infarction: Secondary | ICD-10-CM

## 2014-08-19 DIAGNOSIS — I350 Nonrheumatic aortic (valve) stenosis: Secondary | ICD-10-CM

## 2014-08-19 LAB — URINALYSIS, ROUTINE W REFLEX MICROSCOPIC
Bilirubin Urine: NEGATIVE
GLUCOSE, UA: 250 mg/dL — AB
Ketones, ur: NEGATIVE mg/dL
Nitrite: NEGATIVE
PH: 6 (ref 5.0–8.0)
Protein, ur: NEGATIVE mg/dL
Specific Gravity, Urine: 1.015 (ref 1.005–1.030)
UROBILINOGEN UA: 0.2 mg/dL (ref 0.0–1.0)

## 2014-08-19 LAB — PULMONARY FUNCTION TEST
DL/VA % pred: 80 %
DL/VA: 3.65 ml/min/mmHg/L
DLCO unc % pred: 60 %
DLCO unc: 12.99 ml/min/mmHg
FEF 25-75 PRE: 2.46 L/s
FEF 25-75 Post: 1.1 L/sec
FEF2575-%Change-Post: -55 %
FEF2575-%PRED-POST: 83 %
FEF2575-%PRED-PRE: 186 %
FEV1-%CHANGE-POST: -24 %
FEV1-%Pred-Post: 85 %
FEV1-%Pred-Pre: 113 %
FEV1-Post: 1.51 L
FEV1-Pre: 2 L
FEV1FVC-%CHANGE-POST: -16 %
FEV1FVC-%PRED-PRE: 113 %
FEV6-%Change-Post: -7 %
FEV6-%PRED-POST: 94 %
FEV6-%Pred-Pre: 102 %
FEV6-Post: 2.15 L
FEV6-Pre: 2.33 L
FEV6FVC-%Change-Post: 0 %
FEV6FVC-%PRED-POST: 105 %
FEV6FVC-%PRED-PRE: 105 %
FVC-%Change-Post: -10 %
FVC-%PRED-POST: 89 %
FVC-%PRED-PRE: 99 %
FVC-Post: 2.15 L
FVC-Pre: 2.39 L
PRE FEV1/FVC RATIO: 84 %
PRE FEV6/FVC RATIO: 100 %
Post FEV1/FVC ratio: 70 %
Post FEV6/FVC ratio: 100 %
RV % pred: 72 %
RV: 1.66 L
TLC % PRED: 84 %
TLC: 4.04 L

## 2014-08-19 LAB — BASIC METABOLIC PANEL
Anion gap: 10 (ref 5–15)
BUN: 12 mg/dL (ref 6–20)
CO2: 22 mmol/L (ref 22–32)
CREATININE: 1.18 mg/dL — AB (ref 0.44–1.00)
Calcium: 9.2 mg/dL (ref 8.9–10.3)
Chloride: 109 mmol/L (ref 101–111)
GFR calc Af Amer: 49 mL/min — ABNORMAL LOW (ref >60)
GFR calc non Af Amer: 43 mL/min — ABNORMAL LOW (ref >60)
GLUCOSE: 139 mg/dL — AB (ref 65–99)
Potassium: 4 mmol/L (ref 3.5–5.1)
Sodium: 141 mmol/L (ref 135–145)

## 2014-08-19 LAB — GLUCOSE, CAPILLARY
GLUCOSE-CAPILLARY: 240 mg/dL — AB (ref 65–99)
GLUCOSE-CAPILLARY: 199 mg/dL — AB (ref 65–99)
Glucose-Capillary: 129 mg/dL — ABNORMAL HIGH (ref 65–99)
Glucose-Capillary: 159 mg/dL — ABNORMAL HIGH (ref 65–99)

## 2014-08-19 LAB — CBC
HCT: 31.6 % — ABNORMAL LOW (ref 36.0–46.0)
Hemoglobin: 10.5 g/dL — ABNORMAL LOW (ref 12.0–15.0)
MCH: 30 pg (ref 26.0–34.0)
MCHC: 33.2 g/dL (ref 30.0–36.0)
MCV: 90.3 fL (ref 78.0–100.0)
Platelets: 104 10*3/uL — ABNORMAL LOW (ref 150–400)
RBC: 3.5 MIL/uL — ABNORMAL LOW (ref 3.87–5.11)
RDW: 14.7 % (ref 11.5–15.5)
WBC: 5.9 10*3/uL (ref 4.0–10.5)

## 2014-08-19 LAB — URINE MICROSCOPIC-ADD ON

## 2014-08-19 LAB — HEPARIN LEVEL (UNFRACTIONATED)
HEPARIN UNFRACTIONATED: 0.32 [IU]/mL (ref 0.30–0.70)
Heparin Unfractionated: 0.33 IU/mL (ref 0.30–0.70)

## 2014-08-19 MED ORDER — ASPIRIN EC 81 MG PO TBEC
81.0000 mg | DELAYED_RELEASE_TABLET | Freq: Every day | ORAL | Status: DC
  Administered 2014-08-19 – 2014-08-21 (×3): 81 mg via ORAL
  Filled 2014-08-19 (×4): qty 1

## 2014-08-19 MED ORDER — ALBUTEROL SULFATE (2.5 MG/3ML) 0.083% IN NEBU
2.5000 mg | INHALATION_SOLUTION | Freq: Once | RESPIRATORY_TRACT | Status: AC
  Administered 2014-08-19: 09:00:00 2.5 mg via RESPIRATORY_TRACT

## 2014-08-19 MED ORDER — ALPRAZOLAM 0.5 MG PO TABS
0.5000 mg | ORAL_TABLET | Freq: Every evening | ORAL | Status: DC | PRN
  Administered 2014-08-19: 0.5 mg via ORAL
  Filled 2014-08-19: qty 1

## 2014-08-19 NOTE — Progress Notes (Signed)
Pt with order to transfer to 2W30. Report given to [facility]. V/S stable. No chest pain or discomfort. Son Sonia Side) notified. Transferred via wheelchair.

## 2014-08-19 NOTE — Progress Notes (Signed)
ANTICOAGULATION CONSULT NOTE - Follow Up Consult  Pharmacy Consult for Heparin Indication: CAD, s/p cath  Allergies  Allergen Reactions  . Clarithromycin     REACTION: Vomiting and diarrhea.  . Codeine   . Sulfa Antibiotics   . Atorvastatin Other (See Comments)    Made her knees buckle and muscles ache    Patient Measurements: Height: 5\' 3"  (160 cm) Weight: 141 lb 15.6 oz (64.4 kg) IBW/kg (Calculated) : 52.4 Heparin Dosing Weight: 59.5 kg  Vital Signs: Temp: 98 F (36.7 C) (07/28 2350) Temp Source: Oral (07/28 2350) BP: 134/47 mmHg (07/28 2350) Pulse Rate: 66 (07/28 2350)  Labs:  Recent Labs  08/16/14 0526 08/16/14 1630 08/17/14 0307 08/17/14 1104 08/18/14 0258 08/19/14 0039  HGB 10.6*  --  10.6*  --  10.5* 10.5*  HCT 31.9*  --  31.9*  --  31.4* 31.6*  PLT 110*  --  100*  --  99* 104*  LABPROT 14.2  --   --   --   --   --   INR 1.08  --   --   --   --   --   HEPARINUNFRC 0.77*  --   --  0.31  --  0.33  CREATININE 1.47*  --  1.35*  --  1.31*  --   TROPONINI 0.24* 0.13*  --   --   --   --     Estimated Creatinine Clearance: 31.4 mL/min (by C-G formula based on Cr of 1.31).   Assessment: 81yo F on heparin for single vessel CAD - unable to do PCI. TCTS has been consulted for possible CABG. Heparin level therapeutic on 700 units/hr. CBC stable. No bleeding noted.  Goal of Therapy:  Heparin level 0.3-0.7 units/ml Monitor platelets by anticoagulation protocol: Yes   Plan:  Continue heparin at 700 units/hr. Will f/u daily heparin level and CBC F/u plans for possible CABG  Sherlon Handing, PharmD, BCPS Clinical pharmacist, pager [phone] 08/19/2014 1:14 AM

## 2014-08-19 NOTE — Progress Notes (Signed)
  Echocardiogram  2D Echocardiogram has been performed.  Darlina Sicilian M 08/19/2014, 12:41 PM

## 2014-08-19 NOTE — Progress Notes (Deleted)
Patient c/o burning with urine output. UA sent awaiting results.

## 2014-08-19 NOTE — Progress Notes (Signed)
ANTICOAGULATION CONSULT NOTE - Follow Up Consult  Pharmacy Consult for Heparin Indication: CAD, s/p cath  Allergies  Allergen Reactions  . Clarithromycin     REACTION: Vomiting and diarrhea.  . Codeine   . Sulfa Antibiotics   . Atorvastatin Other (See Comments)    Made her knees buckle and muscles ache    Patient Measurements: Height: 5\' 3"  (160 cm) Weight: 141 lb 15.6 oz (64.4 kg) IBW/kg (Calculated) : 52.4 Heparin Dosing Weight: 59.5 kg  Vital Signs: Temp: 97 F (36.1 C) (07/29 0557) Temp Source: Oral (07/29 0557) BP: 155/60 mmHg (07/29 0557) Pulse Rate: 77 (07/29 0557)  Labs:  Recent Labs  08/16/14 1630  08/17/14 0307 08/17/14 1104 08/18/14 0258 08/19/14 0039 08/19/14 1134  HGB  --   < > 10.6*  --  10.5* 10.5*  --   HCT  --   --  31.9*  --  31.4* 31.6*  --   PLT  --   --  100*  --  99* 104*  --   HEPARINUNFRC  --   --   --  0.31  --  0.33 0.32  CREATININE  --   --  1.35*  --  1.31* 1.18*  --   TROPONINI 0.13*  --   --   --   --   --   --   < > = values in this interval not displayed.  Estimated Creatinine Clearance: 34.9 mL/min (by C-G formula based on Cr of 1.18).   Assessment: 81yo F on heparin for single vessel CAD - unable to do PCI. TCTS has been consulted for possible CABG. Heparin level therapeutic x2 on 700 units/hr. CBC stable. No bleeding noted.  Goal of Therapy:  Heparin level 0.3-0.7 units/ml Monitor platelets by anticoagulation protocol: Yes   Plan:  Continue heparin at 700 units/hr. Will f/u daily heparin level and CBC F/u plans for possible CABG   Hughes Better, PharmD, BCPS Clinical Pharmacist Pager: [phone] 08/19/2014 12:34 PM

## 2014-08-19 NOTE — Progress Notes (Signed)
   SUBJECTIVE:  Sleepy, did not sleep well last night.  OBJECTIVE:   Vitals:   Filed Vitals:   08/18/14 2031 08/18/14 2350 08/19/14 0000 08/19/14 0557  BP: 158/65 134/47  155/60  Pulse: 68 66  77  Temp: 98 F (36.7 C) 98 F (36.7 C)  97 F (36.1 C)  TempSrc: Oral Oral  Oral  Resp: 20 19  19   Height:      Weight:   141 lb 15.6 oz (64.4 kg)   SpO2: 99% 98%  99%   I&O's:   Intake/Output Summary (Last 24 hours) at 08/19/14 1232 Last data filed at 08/19/14 0700  Gross per 24 hour  Intake    478 ml  Output   1300 ml  Net   -822 ml        PHYSICAL EXAM General: Well developed, well nourished, in no acute distress Head:   Normal cephalic and atramatic  Lungs:  Clear bilaterally to auscultation. Heart:  HRRR   Abdomen: abdomen soft and non-tender Msk:  Back normal,  Normal strength and tone for age. Extremities:   No edema.  2+ right radial pulse Neuro: Alert and oriented. Psych:  Normal affect, responds appropriately Skin: No rash   LABS: Basic Metabolic Panel:  Recent Labs  08/18/14 0258 08/19/14 0039  NA 140 141  K 3.9 4.0  CL 105 109  CO2 28 22  GLUCOSE 129* 139*  BUN 16 12  CREATININE 1.31* 1.18*  CALCIUM 8.9 9.2   Liver Function Tests: No results for input(s): AST, ALT, ALKPHOS, BILITOT, PROT, ALBUMIN in the last 72 hours. No results for input(s): LIPASE, AMYLASE in the last 72 hours. CBC:  Recent Labs  08/18/14 0258 08/19/14 0039  WBC 6.6 5.9  HGB 10.5* 10.5*  HCT 31.4* 31.6*  MCV 91.8 90.3  PLT 99* 104*   Cardiac Enzymes:  Recent Labs  08/16/14 1630  TROPONINI 0.13*   BNP: Invalid input(s): POCBNP D-Dimer: No results for input(s): DDIMER in the last 72 hours. Hemoglobin A1C: No results for input(s): HGBA1C in the last 72 hours. Fasting Lipid Panel: No results for input(s): CHOL, HDL, LDLCALC, TRIG, CHOLHDL, LDLDIRECT in the last 72 hours. Thyroid Function Tests: No results for input(s): TSH, T4TOTAL, T3FREE, THYROIDAB in the  last 72 hours.  Invalid input(s): FREET3 Anemia Panel: No results for input(s): VITAMINB12, FOLATE, FERRITIN, TIBC, IRON, RETICCTPCT in the last 72 hours. Coag Panel:   Lab Results  Component Value Date   INR 1.08 08/16/2014   INR 0.88 03/25/2013   INR 0.89 03/07/2010    RADIOLOGY: No results found.    ASSESSMENT: Kathyrn Lass:  1) CAD: scheduled for single vessel CABG on Monday.  D/w Dr. Roxan Hockey.  Will decide on whether Ao valve intervention is needed based on repeat echo which is pending.    SPoke to family.  COntinue IV heparin until Monday.  Holding Plavix.   Jettie Booze, MD  08/19/2014  12:32 PM

## 2014-08-19 NOTE — Progress Notes (Signed)
CARDIAC REHAB PHASE I   PRE:  Rate/Rhythm: 51 SR  BP:  Sitting: 167/75        SaO2: 99 RA  MODE:  Ambulation: 60 ft   POST:  Rate/Rhythm: 77 sR  BP:  Sitting: 181/79         SaO2: 99 RA  Pt states she is very tired, just returned from PFTs, agreeable to ambulate. Pt ambulated 60 ft on RA, IV, handheld assist, mildly unsteady gait at baseline, tolerated fair. Pt did c/o of fatigue, states "I just feel so tired," denies CP, dizziness, declined rest stop. Pt BP elevated, just received her morning meds prior to walk. Pt states "it feels good to get out of bed." Pre-op education completed with pt and daughter at bedside. Reviewed IS, sternal precautions, activity progression, gave pt cardiac surgery book and OHS guidelines. Pt and daughter verbalized understanding. Encouraged pt be OOB as tolerated, up in chair, and ambulating as able. Pt falling asleep by end of conversation, in bed, call bell within reach, daughter at bedside. Will follow-up for ambulation tomorrow as schedule permits.  [phone]  Sharon Sciara, RN, BSN 08/19/2014 11:07 AM

## 2014-08-20 ENCOUNTER — Inpatient Hospital Stay (HOSPITAL_COMMUNITY): Payer: Medicare Other

## 2014-08-20 LAB — CBC
HEMATOCRIT: 29.2 % — AB (ref 36.0–46.0)
Hemoglobin: 9.5 g/dL — ABNORMAL LOW (ref 12.0–15.0)
MCH: 29.6 pg (ref 26.0–34.0)
MCHC: 32.5 g/dL (ref 30.0–36.0)
MCV: 91 fL (ref 78.0–100.0)
Platelets: 103 10*3/uL — ABNORMAL LOW (ref 150–400)
RBC: 3.21 MIL/uL — ABNORMAL LOW (ref 3.87–5.11)
RDW: 14.8 % (ref 11.5–15.5)
WBC: 6.1 10*3/uL (ref 4.0–10.5)

## 2014-08-20 LAB — HEPARIN LEVEL (UNFRACTIONATED): HEPARIN UNFRACTIONATED: 0.42 [IU]/mL (ref 0.30–0.70)

## 2014-08-20 LAB — GLUCOSE, CAPILLARY
GLUCOSE-CAPILLARY: 204 mg/dL — AB (ref 65–99)
GLUCOSE-CAPILLARY: 188 mg/dL — AB (ref 65–99)
Glucose-Capillary: 155 mg/dL — ABNORMAL HIGH (ref 65–99)
Glucose-Capillary: 220 mg/dL — ABNORMAL HIGH (ref 65–99)

## 2014-08-20 LAB — BASIC METABOLIC PANEL
Anion gap: 4 — ABNORMAL LOW (ref 5–15)
BUN: 9 mg/dL (ref 6–20)
CHLORIDE: 110 mmol/L (ref 101–111)
CO2: 27 mmol/L (ref 22–32)
Calcium: 9 mg/dL (ref 8.9–10.3)
Creatinine, Ser: 1.24 mg/dL — ABNORMAL HIGH (ref 0.44–1.00)
GFR calc Af Amer: 47 mL/min — ABNORMAL LOW (ref >60)
GFR calc non Af Amer: 40 mL/min — ABNORMAL LOW (ref >60)
GLUCOSE: 182 mg/dL — AB (ref 65–99)
Potassium: 4 mmol/L (ref 3.5–5.1)
Sodium: 141 mmol/L (ref 135–145)

## 2014-08-20 MED ORDER — LEVOFLOXACIN 500 MG PO TABS
500.0000 mg | ORAL_TABLET | Freq: Every day | ORAL | Status: DC
  Administered 2014-08-20 – 2014-08-21 (×2): 500 mg via ORAL
  Filled 2014-08-20 (×3): qty 1

## 2014-08-20 NOTE — Progress Notes (Signed)
VASCULAR LAB PRELIMINARY  PRELIMINARY  PRELIMINARY  PRELIMINARY  Pre-op Cardiac Surgery  Carotid Findings:  Right : 1% to 39% ICA stenosis. Left : 40% to 59% ICA stenosis. Bilateral : Vertebral artery flow is antegrade   Upper Extremity Right Left  Brachial Pressures 152 Triphasic 138 Triphasic  Radial Waveforms Triphasic Triphasic  Ulnar Waveforms Triphasic Triphasic  Palmar Arch (Allen's Test) Abnormal Abnormal   Findings:  Doppler waveforms bilaterally remained normal with radial compressions and diminished 50% with ulnar compressions    Lower  Extremity Right Left  Dorsalis Pedis >300 Triphasic >300 Triphasic  Posterior Tibial >300 Triphasic >300 Triphasic  Ankle/Brachial Indices N/A N/A    Findings:  ABIs could not be ascertained due to non compressible arteries bilaterally. Doppler waveforms are normal bilaterally.   Murrell Elizondo, RVS 08/20/2014, 3:25 PM

## 2014-08-20 NOTE — Progress Notes (Signed)
3 Days Post-Op Procedure(s) (LRB): Coronary/Graft Atherectomy (N/A) Subjective: C/o headache. Denies CP   Objective: Vital signs in last 24 hours: Temp:  [97.8 F (36.6 C)-101 F (38.3 C)] 98.9 F (37.2 C) (07/30 0556) Pulse Rate:  [70-78] 72 (07/30 0556) Cardiac Rhythm:  [-] Normal sinus rhythm (07/29 1930) Resp:  [16-18] 16 (07/30 0556) BP: (132-157)/(51-71) 146/71 mmHg (07/30 0556) SpO2:  [96 %-99 %] 96 % (07/30 0556) Weight:  [130 lb 1.1 oz (59 kg)] 130 lb 1.1 oz (59 kg) (07/30 0556)  Hemodynamic parameters for last 24 hours:    Intake/Output from previous day: 07/29 0701 - 07/30 0700 In: 674 [P.O.:470; I.V.:204] Out: 850 [Urine:850] Intake/Output this shift: Total I/O In: 240 [P.O.:240] Out: 350 [Urine:350]  General appearance: alert and no distress Neurologic: intact Heart: regular rate and rhythm and murmur Lungs: clear to auscultation bilaterally Abdomen: normal findings: soft, non-tender  Lab Results:  Recent Labs  08/19/14 0039 08/20/14 0348  WBC 5.9 6.1  HGB 10.5* 9.5*  HCT 31.6* 29.2*  PLT 104* 103*   BMET:  Recent Labs  08/19/14 0039 08/20/14 0348  NA 141 141  K 4.0 4.0  CL 109 110  CO2 22 27  GLUCOSE 139* 182*  BUN 12 9  CREATININE 1.18* 1.24*  CALCIUM 9.2 9.0    PT/INR: No results for input(s): LABPROT, INR in the last 72 hours. ABG    Component Value Date/Time   PHART 7.426 03/26/2013 1104   HCO3 24.4* 03/26/2013 1104   TCO2 26 03/26/2013 1104   ACIDBASEDEF 2.0 03/25/2013 1310   O2SAT 100.0 03/26/2013 1104   CBG (last 3)   Recent Labs  08/19/14 1711 08/19/14 2113 08/20/14 0604  GLUCAP 199* 159* 155*   .u Assessment/Plan: S/P Procedure(s) (LRB): Coronary/Graft Atherectomy (N/A) -  For AVR (tissue valve)/ CABG on Monday 8/1 Her UA had 21-50 WBC- will start levaquin empirically for UTI   LOS: 5 days    Melrose Nakayama 08/20/2014

## 2014-08-20 NOTE — Progress Notes (Signed)
Patient c/o "I think I am getting a cold."" My nose is running and I am sneezing some." " I also have a headache." 2 tylenol p.o. Given for h.a. tylemn

## 2014-08-20 NOTE — Progress Notes (Signed)
ANTICOAGULATION CONSULT NOTE - Follow Up Consult  Pharmacy Consult for Heparin Indication: CAD, s/p cath  Allergies  Allergen Reactions  . Clarithromycin     REACTION: Vomiting and diarrhea.  . Codeine   . Sulfa Antibiotics   . Atorvastatin Other (See Comments)    Made her knees buckle and muscles ache    Patient Measurements: Height: 5\' 3"  (160 cm) Weight: 130 lb 1.1 oz (59 kg) IBW/kg (Calculated) : 52.4 Heparin Dosing Weight: 59.5 kg  Vital Signs: Temp: 98.9 F (37.2 C) (07/30 0556) Temp Source: Oral (07/30 0556) BP: 146/71 mmHg (07/30 0556) Pulse Rate: 72 (07/30 0556)  Labs:  Recent Labs  08/18/14 0258 08/19/14 0039 08/19/14 1134 08/20/14 0348  HGB 10.5* 10.5*  --  9.5*  HCT 31.4* 31.6*  --  29.2*  PLT 99* 104*  --  103*  HEPARINUNFRC  --  0.33 0.32 0.42  CREATININE 1.31* 1.18*  --  1.24*    Estimated Creatinine Clearance: 30.4 mL/min (by C-G formula based on Cr of 1.24).   Assessment: 81yo F on heparin for single vessel CAD - unable to do PCI. TCTS has been consulted plan for CABGx1 8/1. Heparin level therapeutic 0.42  on 700 units/hr. CBC stable. No bleeding noted.  Goal of Therapy:  Heparin level 0.3-0.7 units/ml Monitor platelets by anticoagulation protocol: Yes   Plan:  Continue heparin at 700 units/hr. Will f/u daily heparin level and CBC   Bonnita Nasuti Pharm.D. CPP, BCPS Clinical Pharmacist [phone] 08/20/2014 11:05 AM

## 2014-08-20 NOTE — Progress Notes (Addendum)
Patient ID: CECILIE HEIDEL, female   DOB: November 27, 1933, 81 y.o.   MRN: [id number]     Subjective:    No complaints  Objective:   Temp:  [97.8 F (36.6 C)-101 F (38.3 C)] 98.9 F (37.2 C) (07/30 0556) Pulse Rate:  [70-78] 72 (07/30 0556) Resp:  [16-18] 16 (07/30 0556) BP: (132-157)/(51-71) 146/71 mmHg (07/30 0556) SpO2:  [96 %-99 %] 96 % (07/30 0556) Weight:  [130 lb 1.1 oz (59 kg)] 130 lb 1.1 oz (59 kg) (07/30 0556) Last BM Date: 08/18/14  Filed Weights   08/18/14 0017 08/19/14 0000 08/20/14 0556  Weight: 135 lb 12.9 oz (61.6 kg) 141 lb 15.6 oz (64.4 kg) 130 lb 1.1 oz (59 kg)    Intake/Output Summary (Last 24 hours) at 08/20/14 1126 Last data filed at 08/20/14 0645  Gross per 24 hour  Intake    674 ml  Output    850 ml  Net   -176 ml    Telemetry: NSR  Exam:  General: NAD  Resp: CTAB  Cardiac: RRR, 3/6 systolic murmur RUSB, nO JVD  HE:NIDPOEU soft, NT, ND  MSK:no LE edema  Neuro: no focal deficits   Lab Results:  Basic Metabolic Panel:  Recent Labs Lab 08/16/14 0042  08/18/14 0258 08/19/14 0039 08/20/14 0348  NA 139  < > 140 141 141  K 3.7  < > 3.9 4.0 4.0  CL 101  < > 105 109 110  CO2 27  < > 28 22 27   GLUCOSE 232*  < > 129* 139* 182*  BUN 24*  < > 16 12 9   CREATININE 1.63*  < > 1.31* 1.18* 1.24*  CALCIUM 8.7*  < > 8.9 9.2 9.0  MG 1.7  --   --   --   --   < > = values in this interval not displayed.  Liver Function Tests:  Recent Labs Lab 08/16/14 0042  AST 18  ALT 12*  ALKPHOS 32*  BILITOT 0.6  PROT 5.0*  ALBUMIN 3.0*    CBC:  Recent Labs Lab 08/18/14 0258 08/19/14 0039 08/20/14 0348  WBC 6.6 5.9 6.1  HGB 10.5* 10.5* 9.5*  HCT 31.4* 31.6* 29.2*  MCV 91.8 90.3 91.0  PLT 99* 104* 103*    Cardiac Enzymes:  Recent Labs Lab 08/16/14 0042 08/16/14 0526 08/16/14 1630  TROPONINI 0.19* 0.24* 0.13*    BNP: No results for input(s): PROBNP in the last 8760 hours.  Coagulation:  Recent Labs Lab 08/16/14 0526    INR 1.08    ECG:   Medications:   Scheduled Medications: . amLODipine  10 mg Oral Daily  . aspirin EC  81 mg Oral Daily  . divalproex  500 mg Oral Daily  . docusate sodium  100 mg Oral BID  . insulin aspart  0-15 Units Subcutaneous TID WC  . insulin aspart  0-5 Units Subcutaneous QHS  . isosorbide mononitrate  90 mg Oral QHS  . latanoprost  1 drop Both Eyes QHS  . mirabegron ER  25 mg Oral Daily  . pantoprazole  40 mg Oral Daily  . predniSONE  4 mg Oral Q breakfast  . rosuvastatin  20 mg Oral Daily  . sodium chloride  3 mL Intravenous Q12H  . sodium chloride  3 mL Intravenous Q12H  . timolol  1 drop Both Eyes Daily     Infusions: . heparin 700 Units/hr (08/20/14 0016)     PRN Medications:  sodium chloride, sodium chloride, acetaminophen, ALPRAZolam,  bisacodyl, HYDROcodone-acetaminophen, magnesium hydroxide, nitroGLYCERIN, ondansetron (ZOFRAN) IV, sodium chloride, sodium chloride     Assessment/Plan   1. CAD - cath 08/17/14 with severe RCA disease/CTO, unable to stent as could not get wire across lesion - plan for single vessel CABG early this upcoming week - echo Jan 2016 LVEF 98-42%, grade I diastolic dysfunction  - continue current medical therapy. From notes bradycardia and not on beta blocker. Borderline renal function with GFR 35-40%, will not start ACE-I.   2. Aortic stenosis  - echo Jan 2016 mean grad 20, AVA 1.25, consistent with moderate stenosis. - repeat echo July 2016 mean grad 23, AVA 0.94. Dimensionless index 0.37. Morphologically by report appears severe. Potential AVR with single vessel CABG pending CT surgery review      Carlyle Dolly, M.D.

## 2014-08-21 ENCOUNTER — Inpatient Hospital Stay (HOSPITAL_COMMUNITY): Payer: Medicare Other

## 2014-08-21 LAB — CBC
HCT: 29.1 % — ABNORMAL LOW (ref 36.0–46.0)
Hemoglobin: 9.7 g/dL — ABNORMAL LOW (ref 12.0–15.0)
MCH: 30 pg (ref 26.0–34.0)
MCHC: 33.3 g/dL (ref 30.0–36.0)
MCV: 90.1 fL (ref 78.0–100.0)
PLATELETS: 110 10*3/uL — AB (ref 150–400)
RBC: 3.23 MIL/uL — AB (ref 3.87–5.11)
RDW: 14.5 % (ref 11.5–15.5)
WBC: 6.3 10*3/uL (ref 4.0–10.5)

## 2014-08-21 LAB — GLUCOSE, CAPILLARY
GLUCOSE-CAPILLARY: 272 mg/dL — AB (ref 65–99)
Glucose-Capillary: 131 mg/dL — ABNORMAL HIGH (ref 65–99)
Glucose-Capillary: 127 mg/dL — ABNORMAL HIGH (ref 65–99)
Glucose-Capillary: 260 mg/dL — ABNORMAL HIGH (ref 65–99)

## 2014-08-21 LAB — PROTIME-INR
INR: 1.06 (ref 0.00–1.49)
PROTHROMBIN TIME: 14 s (ref 11.6–15.2)

## 2014-08-21 LAB — HEPARIN LEVEL (UNFRACTIONATED): HEPARIN UNFRACTIONATED: 0.35 [IU]/mL (ref 0.30–0.70)

## 2014-08-21 MED ORDER — PHENYLEPHRINE HCL 10 MG/ML IJ SOLN
30.0000 ug/min | INTRAVENOUS | Status: AC
  Administered 2014-08-22: 20 ug/min via INTRAVENOUS
  Filled 2014-08-21: qty 2

## 2014-08-21 MED ORDER — BISACODYL 5 MG PO TBEC
5.0000 mg | DELAYED_RELEASE_TABLET | Freq: Once | ORAL | Status: AC
  Administered 2014-08-21: 5 mg via ORAL
  Filled 2014-08-21: qty 1

## 2014-08-21 MED ORDER — AMINOCAPROIC ACID 250 MG/ML IV SOLN
INTRAVENOUS | Status: AC
  Administered 2014-08-22: 69.8 mL/h via INTRAVENOUS
  Filled 2014-08-21: qty 40

## 2014-08-21 MED ORDER — DEXTROSE 5 % IV SOLN
0.0000 ug/min | INTRAVENOUS | Status: DC
  Filled 2014-08-21: qty 4

## 2014-08-21 MED ORDER — DIAZEPAM 2 MG PO TABS
2.0000 mg | ORAL_TABLET | Freq: Once | ORAL | Status: AC
  Administered 2014-08-22: 2 mg via ORAL
  Filled 2014-08-21: qty 1

## 2014-08-21 MED ORDER — MAGNESIUM SULFATE 50 % IJ SOLN
40.0000 meq | INTRAMUSCULAR | Status: DC
  Filled 2014-08-21: qty 10

## 2014-08-21 MED ORDER — CHLORHEXIDINE GLUCONATE 4 % EX LIQD
60.0000 mL | Freq: Once | CUTANEOUS | Status: AC
  Administered 2014-08-21: 4 via TOPICAL
  Filled 2014-08-21: qty 60

## 2014-08-21 MED ORDER — INSULIN REGULAR HUMAN 100 UNIT/ML IJ SOLN
INTRAMUSCULAR | Status: AC
  Administered 2014-08-22: 2.6 [IU]/h via INTRAVENOUS
  Filled 2014-08-21: qty 2.5

## 2014-08-21 MED ORDER — HEPARIN SODIUM (PORCINE) 1000 UNIT/ML IJ SOLN
INTRAMUSCULAR | Status: DC
  Filled 2014-08-21: qty 30

## 2014-08-21 MED ORDER — MAGNESIUM HYDROXIDE 400 MG/5ML PO SUSP: 15.0000 mL | Freq: Once | ORAL | Status: DC

## 2014-08-21 MED ORDER — NITROGLYCERIN IN D5W 200-5 MCG/ML-% IV SOLN
2.0000 ug/min | INTRAVENOUS | Status: AC
  Administered 2014-08-22: 10 ug/min via INTRAVENOUS
  Filled 2014-08-21: qty 250

## 2014-08-21 MED ORDER — TEMAZEPAM 15 MG PO CAPS: 15.0000 mg | ORAL_CAPSULE | Freq: Once | ORAL | Status: DC | PRN

## 2014-08-21 MED ORDER — METOPROLOL TARTRATE 12.5 MG HALF TABLET
12.5000 mg | ORAL_TABLET | Freq: Once | ORAL | Status: AC
  Administered 2014-08-22: 12.5 mg via ORAL
  Filled 2014-08-21: qty 1

## 2014-08-21 MED ORDER — DEXMEDETOMIDINE HCL IN NACL 400 MCG/100ML IV SOLN
0.1000 ug/kg/h | INTRAVENOUS | Status: AC
  Administered 2014-08-22: .3 ug/kg/h via INTRAVENOUS
  Filled 2014-08-21: qty 100

## 2014-08-21 MED ORDER — DEXTROSE 5 % IV SOLN
1.5000 g | INTRAVENOUS | Status: AC
  Administered 2014-08-22: .75 g via INTRAVENOUS
  Administered 2014-08-22: 1.5 g via INTRAVENOUS
  Filled 2014-08-21: qty 1.5

## 2014-08-21 MED ORDER — DOPAMINE-DEXTROSE 3.2-5 MG/ML-% IV SOLN
0.0000 ug/kg/min | INTRAVENOUS | Status: DC
  Filled 2014-08-21: qty 250

## 2014-08-21 MED ORDER — PLASMA-LYTE 148 IV SOLN
INTRAVENOUS | Status: AC
  Administered 2014-08-22: 500 mL
  Filled 2014-08-21: qty 2.5

## 2014-08-21 MED ORDER — POTASSIUM CHLORIDE 2 MEQ/ML IV SOLN
80.0000 meq | INTRAVENOUS | Status: DC
  Filled 2014-08-21: qty 40

## 2014-08-21 MED ORDER — VANCOMYCIN HCL 10 G IV SOLR
1250.0000 mg | INTRAVENOUS | Status: AC
  Administered 2014-08-22: 1250 mg via INTRAVENOUS
  Filled 2014-08-21: qty 1250

## 2014-08-21 MED ORDER — NITROGLYCERIN 0.4 MG SL SUBL
SUBLINGUAL_TABLET | SUBLINGUAL | Status: AC
  Filled 2014-08-21: qty 1

## 2014-08-21 MED ORDER — CHLORHEXIDINE GLUCONATE 4 % EX LIQD
60.0000 mL | Freq: Once | CUTANEOUS | Status: AC
  Administered 2014-08-22: 4 via TOPICAL
  Filled 2014-08-21 (×2): qty 60

## 2014-08-21 MED ORDER — DEXTROSE 5 % IV SOLN
750.0000 mg | INTRAVENOUS | Status: DC
  Filled 2014-08-21: qty 750

## 2014-08-21 NOTE — Progress Notes (Signed)
Called Dr. Roxan Hockey to inform him that the patient is experiencing head cold symptoms which seem to be increasing through out the day.  I let him know that she is already on Levoquin for her UTI.

## 2014-08-21 NOTE — Progress Notes (Signed)
ANTICOAGULATION CONSULT NOTE - Follow Up Consult  Pharmacy Consult for Heparin Indication: CAD, s/p cath  Allergies  Allergen Reactions  . Clarithromycin     REACTION: Vomiting and diarrhea.  . Codeine   . Sulfa Antibiotics   . Atorvastatin Other (See Comments)    Made her knees buckle and muscles ache    Patient Measurements: Height: 5\' 3"  (160 cm) Weight: 126 lb 8.7 oz (57.4 kg) IBW/kg (Calculated) : 52.4 Heparin Dosing Weight: 59.5 kg  Vital Signs: Temp: 98.2 F (36.8 C) (07/31 0445) Temp Source: Oral (07/31 0445) BP: 135/60 mmHg (07/31 0713) Pulse Rate: 88 (07/31 0713)  Labs:  Recent Labs  08/19/14 0039 08/19/14 1134 08/20/14 0348 08/21/14 0442 08/21/14 1035  HGB 10.5*  --  9.5* 9.7*  --   HCT 31.6*  --  29.2* 29.1*  --   PLT 104*  --  103* 110*  --   LABPROT  --   --   --   --  14.0  INR  --   --   --   --  1.06  HEPARINUNFRC 0.33 0.32 0.42 0.35  --   CREATININE 1.18*  --  1.24*  --   --     Estimated Creatinine Clearance: 30.4 mL/min (by C-G formula based on Cr of 1.24).   Assessment: 81yo F on heparin for single vessel CAD - unable to do PCI. TCTS has been consulted plan for CABGx1 and possible AVR  8/1. Heparin level therapeutic 0.35  on 700 units/hr. CBC stable. No bleeding noted.  Goal of Therapy:  Heparin level 0.3-0.7 units/ml Monitor platelets by anticoagulation protocol: Yes   Plan:  Continue heparin at 700 units/hr. Will f/u daily heparin level and CBC F/u after OR 8/1   Ione.D. CPP, BCPS Clinical Pharmacist [phone] 08/21/2014 11:46 AM

## 2014-08-21 NOTE — Progress Notes (Signed)
Patient Profile: 81 yo with history of CAD s/p multiple stents (most recently DES to LAD in 2/97), diastolic CHF, moderate aortic stenosis, CKD, diabetes presents with symptoms concerning for unstable angina. Cath showed severe RCA disease/CTO not amendable to PCI. Plans for CABG.    Subjective: Patient had an episode of chest + left arm pain earlier this am that was relieved with SL NTG x 3. Now CP free. No dyspnea.   Objective: Vital signs in last 24 hours: Temp:  [98.2 F (36.8 C)-98.9 F (37.2 C)] 98.2 F (36.8 C) (07/31 0445) Pulse Rate:  [66-88] 88 (07/31 0713) Resp:  [17-18] 18 (07/31 0713) BP: (129-136)/(5-60) 135/60 mmHg (07/31 0713) SpO2:  [97 %-100 %] 100 % (07/31 0713) Weight:  [126 lb 8.7 oz (57.4 kg)] 126 lb 8.7 oz (57.4 kg) (07/31 0445) Last BM Date: 08/18/14  Intake/Output from previous day: 07/30 0701 - 07/31 0700 In: 240 [P.O.:240] Out: 950 [Urine:950] Intake/Output this shift:    Medications Current Facility-Administered Medications  Medication Dose Route Frequency Provider Last Rate Last Dose  . 0.9 %  sodium chloride infusion  250 mL Intravenous PRN Leonie Man, MD      . 0.9 %  sodium chloride infusion  250 mL Intravenous PRN Burnell Blanks, MD 10 mL/hr at 08/19/14 0700 250 mL at 08/19/14 0700  . acetaminophen (TYLENOL) tablet 650 mg  650 mg Oral Q4H PRN Larey Dresser, MD   650 mg at 08/21/14 0502  . ALPRAZolam Duanne Moron) tablet 0.5 mg  0.5 mg Oral QHS PRN Alphia Moh, MD   0.5 mg at 08/19/14 0110  . amLODipine (NORVASC) tablet 10 mg  10 mg Oral Daily Larey Dresser, MD   10 mg at 08/20/14 1020  . aspirin EC tablet 81 mg  81 mg Oral Daily Larey Dresser, MD   81 mg at 08/20/14 1021  . bisacodyl (DULCOLAX) suppository 10 mg  10 mg Rectal Daily PRN Rogelia Mire, NP      . divalproex (DEPAKOTE ER) 24 hr tablet 500 mg  500 mg Oral Daily Larey Dresser, MD   500 mg at 08/20/14 1021  . docusate sodium (COLACE) capsule 100 mg  100  mg Oral BID Rogelia Mire, NP   100 mg at 08/20/14 2129  . heparin ADULT infusion 100 units/mL (25000 units/250 mL)  700 Units/hr Intravenous Continuous Roma Schanz, RPH 7 mL/hr at 08/20/14 0016 700 Units/hr at 08/20/14 0016  . HYDROcodone-acetaminophen (NORCO) 10-325 MG per tablet 1 tablet  1 tablet Oral Q6H PRN Larey Dresser, MD      . insulin aspart (novoLOG) injection 0-15 Units  0-15 Units Subcutaneous TID WC Larey Dresser, MD   2 Units at 08/21/14 [phone]  . insulin aspart (novoLOG) injection 0-5 Units  0-5 Units Subcutaneous QHS Larey Dresser, MD   0 Units at 08/16/14 0015  . isosorbide mononitrate (IMDUR) 24 hr tablet 90 mg  90 mg Oral QHS Larey Dresser, MD   90 mg at 08/20/14 2129  . latanoprost (XALATAN) 0.005 % ophthalmic solution 1 drop  1 drop Both Eyes QHS Larey Dresser, MD   1 drop at 08/20/14 2129  . levofloxacin (LEVAQUIN) tablet 500 mg  500 mg Oral Daily Melrose Nakayama, MD   500 mg at 08/20/14 1329  . magnesium hydroxide (MILK OF MAGNESIA) suspension 15 mL  15 mL Oral Daily PRN Rogelia Mire, NP   15 mL at  08/17/14 2026  . mirabegron ER (MYRBETRIQ) tablet 25 mg  25 mg Oral Daily Larey Dresser, MD   25 mg at 08/20/14 1022  . nitroGLYCERIN (NITROSTAT) 0.4 MG SL tablet           . nitroGLYCERIN (NITROSTAT) SL tablet 0.4 mg  0.4 mg Sublingual Q5 Min x 3 PRN Larey Dresser, MD   0.4 mg at 08/21/14 9242  . ondansetron (ZOFRAN) injection 4 mg  4 mg Intravenous Q6H PRN Larey Dresser, MD      . pantoprazole (PROTONIX) EC tablet 40 mg  40 mg Oral Daily Larey Dresser, MD   40 mg at 08/20/14 1028  . predniSONE (DELTASONE) tablet 4 mg  4 mg Oral Q breakfast Larey Dresser, MD   4 mg at 08/20/14 1022  . rosuvastatin (CRESTOR) tablet 20 mg  20 mg Oral Daily Larey Dresser, MD   20 mg at 08/20/14 1022  . sodium chloride 0.9 % injection 3 mL  3 mL Intravenous Q12H Leonie Man, MD   3 mL at 08/20/14 2131  . sodium chloride 0.9 % injection 3 mL  3 mL  Intravenous PRN Leonie Man, MD      . sodium chloride 0.9 % injection 3 mL  3 mL Intravenous Q12H Burnell Blanks, MD   3 mL at 08/20/14 2130  . sodium chloride 0.9 % injection 3 mL  3 mL Intravenous PRN Burnell Blanks, MD      . timolol (TIMOPTIC) 0.5 % ophthalmic solution 1 drop  1 drop Both Eyes Daily Larey Dresser, MD   1 drop at 08/20/14 1033    PE: General appearance: alert, cooperative and no distress Neck: no carotid bruit and no JVD Lungs: clear to auscultation bilaterally Heart: regular rate and rhythm and 3/6 SM loudest at RUSB Extremities: no LEE Pulses: 2+ and symmetric Skin: warm and dry Neurologic: Grossly normal  Lab Results:   Recent Labs  08/19/14 0039 08/20/14 0348 08/21/14 0442  WBC 5.9 6.1 6.3  HGB 10.5* 9.5* 9.7*  HCT 31.6* 29.2* 29.1*  PLT 104* 103* 110*   BMET  Recent Labs  08/19/14 0039 08/20/14 0348  NA 141 141  K 4.0 4.0  CL 109 110  CO2 22 27  GLUCOSE 139* 182*  BUN 12 9  CREATININE 1.18* 1.24*  CALCIUM 9.2 9.0    Assessment/Plan    Principal Problem:   Unstable angina Active Problems:   Type 2 diabetes mellitus treated with insulin   Bradycardia   Aortic stenosis   CKD (chronic kidney disease) stage 3, GFR 30-59 ml/min   Chronic diastolic CHF (congestive heart failure)   CAD S/P percutaneous coronary angioplasty   NSTEMI (non-ST elevated myocardial infarction)  1. CAD: with severe RCA disease/CTO not amendable to PCI. Plan for single vessel CABG tomorrow. Continue IV heparin. No BB due to bradycardia. No ACE-I due to renal insufficieny. Continue Crestor.   2. Aortic Stenosis: echo Jan 2016 mean grad 20, AVA 1.25, consistent with moderate stenosis. - repeat echo July 2016 mean grad 23, AVA 0.94. Dimensionless index 0.37. Morphologically by report appears severe. Potential AVR with single vessel CABG pending CT surgery review   LOS: 6 days    Brittainy M. Ladoris Gene 08/21/2014 7:43 AM  Patient seen  and discussed with PA Rosita Fire, I agree with her documentation above. Plan for CABG with AVR tomorrow. Continue medical therapy today. Chest pain this AM that resolved, EKG with  some ST depressions lateral leads. Currently pain free, continue medical therapy until CABG tomorrow.  Zandra Abts MD

## 2014-08-22 ENCOUNTER — Inpatient Hospital Stay (HOSPITAL_COMMUNITY): Payer: Medicare Other | Admitting: Anesthesiology

## 2014-08-22 ENCOUNTER — Inpatient Hospital Stay (HOSPITAL_COMMUNITY): Payer: Medicare Other

## 2014-08-22 ENCOUNTER — Encounter (HOSPITAL_COMMUNITY)
Admission: AD | Disposition: A | Discharge status: Skilled Nursing Facility | Payer: Medicare Other | Source: Other Acute Inpatient Hospital | Attending: Thoracic Surgery (Cardiothoracic Vascular Surgery)

## 2014-08-22 DIAGNOSIS — I350 Nonrheumatic aortic (valve) stenosis: Secondary | ICD-10-CM

## 2014-08-22 DIAGNOSIS — Z952 Presence of prosthetic heart valve: Secondary | ICD-10-CM

## 2014-08-22 DIAGNOSIS — I25110 Atherosclerotic heart disease of native coronary artery with unstable angina pectoris: Secondary | ICD-10-CM

## 2014-08-22 HISTORY — PX: CORONARY ARTERY BYPASS GRAFT: SHX141

## 2014-08-22 HISTORY — DX: Nonrheumatic aortic (valve) stenosis: I35.0

## 2014-08-22 HISTORY — PX: AORTIC VALVE REPLACEMENT: SHX41

## 2014-08-22 HISTORY — PX: TEE WITHOUT CARDIOVERSION: SHX5443

## 2014-08-22 LAB — GLUCOSE, CAPILLARY
GLUCOSE-CAPILLARY: 79 mg/dL (ref 65–99)
GLUCOSE-CAPILLARY: 165 mg/dL — AB (ref 65–99)
Glucose-Capillary: 88 mg/dL (ref 65–99)
Glucose-Capillary: 89 mg/dL (ref 65–99)
Glucose-Capillary: 115 mg/dL — ABNORMAL HIGH (ref 65–99)
Glucose-Capillary: 132 mg/dL — ABNORMAL HIGH (ref 65–99)

## 2014-08-22 LAB — CBC
HCT: 28.4 % — ABNORMAL LOW (ref 36.0–46.0)
HCT: 28.9 % — ABNORMAL LOW (ref 36.0–46.0)
HCT: 28.4 % — ABNORMAL LOW (ref 36.0–46.0)
HEMATOCRIT: 27.7 % — AB (ref 36.0–46.0)
Hemoglobin: 10.2 g/dL — ABNORMAL LOW (ref 12.0–15.0)
Hemoglobin: 9.4 g/dL — ABNORMAL LOW (ref 12.0–15.0)
Hemoglobin: 9.7 g/dL — ABNORMAL LOW (ref 12.0–15.0)
Hemoglobin: 9.9 g/dL — ABNORMAL LOW (ref 12.0–15.0)
MCH: 30.3 pg (ref 26.0–34.0)
MCH: 30.8 pg (ref 26.0–34.0)
MCH: 30.9 pg (ref 26.0–34.0)
MCH: 31.3 pg (ref 26.0–34.0)
MCHC: 33.9 g/dL (ref 30.0–36.0)
MCHC: 34.2 g/dL (ref 30.0–36.0)
MCHC: 34.9 g/dL (ref 30.0–36.0)
MCHC: 35.3 g/dL (ref 30.0–36.0)
MCV: 90.4 fL (ref 78.0–100.0)
MCV: 90.8 fL (ref 78.0–100.0)
MCV: 88.7 fL (ref 78.0–100.0)
MCV: 86.9 fL (ref 78.0–100.0)
Platelets: 114 10*3/uL — ABNORMAL LOW (ref 150–400)
Platelets: 114 10*3/uL — ABNORMAL LOW (ref 150–400)
Platelets: 115 10*3/uL — ABNORMAL LOW (ref 150–400)
Platelets: 116 10*3/uL — ABNORMAL LOW (ref 150–400)
RBC: 3.05 MIL/uL — AB (ref 3.87–5.11)
RBC: 3.26 MIL/uL — ABNORMAL LOW (ref 3.87–5.11)
RBC: 3.14 MIL/uL — AB (ref 3.87–5.11)
RBC: 3.27 MIL/uL — ABNORMAL LOW (ref 3.87–5.11)
RDW: 14.6 % (ref 11.5–15.5)
RDW: 14.6 % (ref 11.5–15.5)
RDW: 14.9 % (ref 11.5–15.5)
RDW: 14.9 % (ref 11.5–15.5)
WBC: 5.1 10*3/uL (ref 4.0–10.5)
WBC: 5.1 10*3/uL (ref 4.0–10.5)
WBC: 5.5 10*3/uL (ref 4.0–10.5)
WBC: 5.6 10*3/uL (ref 4.0–10.5)

## 2014-08-22 LAB — POCT I-STAT, CHEM 8
BUN: 14 mg/dL (ref 6–20)
BUN: 13 mg/dL (ref 6–20)
BUN: 11 mg/dL (ref 6–20)
BUN: 13 mg/dL (ref 6–20)
BUN: 12 mg/dL (ref 6–20)
BUN: 12 mg/dL (ref 6–20)
BUN: 10 mg/dL (ref 6–20)
CALCIUM ION: 1.28 mmol/L (ref 1.13–1.30)
CHLORIDE: 105 mmol/L (ref 101–111)
CHLORIDE: 101 mmol/L (ref 101–111)
CHLORIDE: 103 mmol/L (ref 101–111)
CHLORIDE: 105 mmol/L (ref 101–111)
CREATININE: 1 mg/dL (ref 0.44–1.00)
CREATININE: 0.9 mg/dL (ref 0.44–1.00)
CREATININE: 0.8 mg/dL (ref 0.44–1.00)
CREATININE: 0.9 mg/dL (ref 0.44–1.00)
Calcium, Ion: 1.31 mmol/L — ABNORMAL HIGH (ref 1.13–1.30)
Calcium, Ion: 1.07 mmol/L — ABNORMAL LOW (ref 1.13–1.30)
Calcium, Ion: 1.09 mmol/L — ABNORMAL LOW (ref 1.13–1.30)
Calcium, Ion: 1.03 mmol/L — ABNORMAL LOW (ref 1.13–1.30)
Calcium, Ion: 1 mmol/L — ABNORMAL LOW (ref 1.13–1.30)
Calcium, Ion: 1.24 mmol/L (ref 1.13–1.30)
Chloride: 104 mmol/L (ref 101–111)
Chloride: 103 mmol/L (ref 101–111)
Chloride: 103 mmol/L (ref 101–111)
Creatinine, Ser: 0.8 mg/dL (ref 0.44–1.00)
Creatinine, Ser: 0.8 mg/dL (ref 0.44–1.00)
Creatinine, Ser: 0.9 mg/dL (ref 0.44–1.00)
GLUCOSE: 166 mg/dL — AB (ref 65–99)
GLUCOSE: 148 mg/dL — AB (ref 65–99)
GLUCOSE: 147 mg/dL — AB (ref 65–99)
Glucose, Bld: 192 mg/dL — ABNORMAL HIGH (ref 65–99)
Glucose, Bld: 182 mg/dL — ABNORMAL HIGH (ref 65–99)
Glucose, Bld: 170 mg/dL — ABNORMAL HIGH (ref 65–99)
Glucose, Bld: 156 mg/dL — ABNORMAL HIGH (ref 65–99)
HCT: 26 % — ABNORMAL LOW (ref 36.0–46.0)
HCT: 23 % — ABNORMAL LOW (ref 36.0–46.0)
HCT: 22 % — ABNORMAL LOW (ref 36.0–46.0)
HCT: 21 % — ABNORMAL LOW (ref 36.0–46.0)
HCT: 23 % — ABNORMAL LOW (ref 36.0–46.0)
HEMATOCRIT: 26 % — AB (ref 36.0–46.0)
HEMATOCRIT: 29 % — AB (ref 36.0–46.0)
HEMOGLOBIN: 8.8 g/dL — AB (ref 12.0–15.0)
HEMOGLOBIN: 7.5 g/dL — AB (ref 12.0–15.0)
HEMOGLOBIN: 7.8 g/dL — AB (ref 12.0–15.0)
Hemoglobin: 7.8 g/dL — ABNORMAL LOW (ref 12.0–15.0)
Hemoglobin: 8.8 g/dL — ABNORMAL LOW (ref 12.0–15.0)
Hemoglobin: 7.1 g/dL — ABNORMAL LOW (ref 12.0–15.0)
Hemoglobin: 9.9 g/dL — ABNORMAL LOW (ref 12.0–15.0)
POTASSIUM: 4.5 mmol/L (ref 3.5–5.1)
POTASSIUM: 4.5 mmol/L (ref 3.5–5.1)
Potassium: 3.7 mmol/L (ref 3.5–5.1)
Potassium: 3.2 mmol/L — ABNORMAL LOW (ref 3.5–5.1)
Potassium: 3.4 mmol/L — ABNORMAL LOW (ref 3.5–5.1)
Potassium: 5 mmol/L (ref 3.5–5.1)
Potassium: 4.1 mmol/L (ref 3.5–5.1)
SODIUM: 137 mmol/L (ref 135–145)
SODIUM: 138 mmol/L (ref 135–145)
SODIUM: 139 mmol/L (ref 135–145)
SODIUM: 137 mmol/L (ref 135–145)
Sodium: 137 mmol/L (ref 135–145)
Sodium: 137 mmol/L (ref 135–145)
Sodium: 138 mmol/L (ref 135–145)
TCO2: 27 mmol/L (ref 0–100)
TCO2: 25 mmol/L (ref 0–100)
TCO2: 25 mmol/L (ref 0–100)
TCO2: 24 mmol/L (ref 0–100)
TCO2: 23 mmol/L (ref 0–100)
TCO2: 25 mmol/L (ref 0–100)
TCO2: 21 mmol/L (ref 0–100)

## 2014-08-22 LAB — POCT I-STAT 3, ART BLOOD GAS (G3+)
ACID-BASE EXCESS: 1 mmol/L (ref 0.0–2.0)
Acid-Base Excess: 1 mmol/L (ref 0.0–2.0)
Acid-base deficit: 5 mmol/L — ABNORMAL HIGH (ref 0.0–2.0)
Acid-base deficit: 4 mmol/L — ABNORMAL HIGH (ref 0.0–2.0)
Bicarbonate: 24.8 mEq/L — ABNORMAL HIGH (ref 20.0–24.0)
Bicarbonate: 25.8 mEq/L — ABNORMAL HIGH (ref 20.0–24.0)
Bicarbonate: 24.1 mEq/L — ABNORMAL HIGH (ref 20.0–24.0)
Bicarbonate: 20 mEq/L (ref 20.0–24.0)
Bicarbonate: 21.2 mEq/L (ref 20.0–24.0)
O2 SAT: 100 %
O2 Saturation: 100 %
O2 Saturation: 98 %
O2 Saturation: 95 %
O2 Saturation: 92 %
PCO2 ART: 42.6 mmHg (ref 35.0–45.0)
PH ART: 7.391 (ref 7.350–7.450)
PH ART: 7.376 (ref 7.350–7.450)
PO2 ART: 335 mmHg — AB (ref 80.0–100.0)
Patient temperature: 35.9
Patient temperature: 36.6
Patient temperature: 37.1
TCO2: 26 mmol/L (ref 0–100)
TCO2: 27 mmol/L (ref 0–100)
TCO2: 25 mmol/L (ref 0–100)
TCO2: 21 mmol/L (ref 0–100)
TCO2: 22 mmol/L (ref 0–100)
pCO2 arterial: 33.2 mmHg — ABNORMAL LOW (ref 35.0–45.0)
pCO2 arterial: 36.5 mmHg (ref 35.0–45.0)
pCO2 arterial: 34 mmHg — ABNORMAL LOW (ref 35.0–45.0)
pCO2 arterial: 36.4 mmHg (ref 35.0–45.0)
pH, Arterial: 7.482 — ABNORMAL HIGH (ref 7.350–7.450)
pH, Arterial: 7.423 (ref 7.350–7.450)
pH, Arterial: 7.373 (ref 7.350–7.450)
pO2, Arterial: 320 mmHg — ABNORMAL HIGH (ref 80.0–100.0)
pO2, Arterial: 101 mmHg — ABNORMAL HIGH (ref 80.0–100.0)
pO2, Arterial: 76 mmHg — ABNORMAL LOW (ref 80.0–100.0)
pO2, Arterial: 64 mmHg — ABNORMAL LOW (ref 80.0–100.0)

## 2014-08-22 LAB — BASIC METABOLIC PANEL
Anion gap: 7 (ref 5–15)
BUN: 15 mg/dL (ref 6–20)
CHLORIDE: 104 mmol/L (ref 101–111)
CO2: 25 mmol/L (ref 22–32)
Calcium: 9 mg/dL (ref 8.9–10.3)
Creatinine, Ser: 1.36 mg/dL — ABNORMAL HIGH (ref 0.44–1.00)
GFR calc Af Amer: 42 mL/min — ABNORMAL LOW (ref >60)
GFR, EST NON AFRICAN AMERICAN: 36 mL/min — AB (ref >60)
Glucose, Bld: 243 mg/dL — ABNORMAL HIGH (ref 65–99)
Potassium: 4.2 mmol/L (ref 3.5–5.1)
Sodium: 136 mmol/L (ref 135–145)

## 2014-08-22 LAB — CREATININE, SERUM
Creatinine, Ser: 0.95 mg/dL (ref 0.44–1.00)
GFR, EST NON AFRICAN AMERICAN: 55 mL/min — AB (ref >60)

## 2014-08-22 LAB — PLATELET COUNT: Platelets: 67 10*3/uL — ABNORMAL LOW (ref 150–400)

## 2014-08-22 LAB — POCT I-STAT 4, (NA,K, GLUC, HGB,HCT)
Glucose, Bld: 132 mg/dL — ABNORMAL HIGH (ref 65–99)
HCT: 31 % — ABNORMAL LOW (ref 36.0–46.0)
Hemoglobin: 10.5 g/dL — ABNORMAL LOW (ref 12.0–15.0)
Potassium: 3.7 mmol/L (ref 3.5–5.1)
SODIUM: 140 mmol/L (ref 135–145)

## 2014-08-22 LAB — HEMOGLOBIN AND HEMATOCRIT, BLOOD
HCT: 24.6 % — ABNORMAL LOW (ref 36.0–46.0)
HEMOGLOBIN: 8.6 g/dL — AB (ref 12.0–15.0)

## 2014-08-22 LAB — HEPARIN LEVEL (UNFRACTIONATED): Heparin Unfractionated: 0.26 IU/mL — ABNORMAL LOW (ref 0.30–0.70)

## 2014-08-22 LAB — PROTIME-INR
INR: 1.4 (ref 0.00–1.49)
Prothrombin Time: 17.3 seconds — ABNORMAL HIGH (ref 11.6–15.2)

## 2014-08-22 LAB — MAGNESIUM: Magnesium: 2.8 mg/dL — ABNORMAL HIGH (ref 1.7–2.4)

## 2014-08-22 LAB — APTT: aPTT: 35 seconds (ref 24–37)

## 2014-08-22 SURGERY — CORONARY ARTERY BYPASS GRAFTING (CABG)
Anesthesia: General | Site: Chest

## 2014-08-22 MED ORDER — SODIUM CHLORIDE 0.45 % IV SOLN: INTRAVENOUS | Status: DC | PRN

## 2014-08-22 MED ORDER — METOPROLOL TARTRATE 25 MG/10 ML ORAL SUSPENSION
12.5000 mg | Freq: Two times a day (BID) | ORAL | Status: DC
  Filled 2014-08-22 (×15): qty 5

## 2014-08-22 MED ORDER — OXYCODONE HCL 5 MG PO TABS: 5.0000 mg | ORAL_TABLET | ORAL | Status: DC | PRN

## 2014-08-22 MED ORDER — VANCOMYCIN HCL IN DEXTROSE 1-5 GM/200ML-% IV SOLN
1000.0000 mg | Freq: Once | INTRAVENOUS | Status: AC
  Administered 2014-08-22: 1000 mg via INTRAVENOUS
  Filled 2014-08-22: qty 200

## 2014-08-22 MED ORDER — MORPHINE SULFATE 2 MG/ML IJ SOLN: 2.0000 mg | INTRAMUSCULAR | Status: DC | PRN

## 2014-08-22 MED ORDER — FENTANYL CITRATE (PF) 250 MCG/5ML IJ SOLN
INTRAMUSCULAR | Status: AC
  Filled 2014-08-22: qty 5

## 2014-08-22 MED ORDER — NITROGLYCERIN IN D5W 200-5 MCG/ML-% IV SOLN: 0.0000 ug/min | INTRAVENOUS | Status: DC

## 2014-08-22 MED ORDER — SODIUM CHLORIDE 0.9 % IV SOLN: 250.0000 mL | INTRAVENOUS | Status: DC

## 2014-08-22 MED ORDER — PHENYLEPHRINE 40 MCG/ML (10ML) SYRINGE FOR IV PUSH (FOR BLOOD PRESSURE SUPPORT)
PREFILLED_SYRINGE | INTRAVENOUS | Status: AC
  Filled 2014-08-22: qty 10

## 2014-08-22 MED ORDER — 0.9 % SODIUM CHLORIDE (POUR BTL) OPTIME
TOPICAL | Status: DC | PRN
  Administered 2014-08-22: 6000 mL

## 2014-08-22 MED ORDER — PANTOPRAZOLE SODIUM 40 MG PO TBEC: 40.0000 mg | DELAYED_RELEASE_TABLET | Freq: Every day | ORAL | Status: DC

## 2014-08-22 MED ORDER — ROCURONIUM BROMIDE 50 MG/5ML IV SOLN
INTRAVENOUS | Status: AC
  Filled 2014-08-22: qty 2

## 2014-08-22 MED ORDER — ONDANSETRON HCL 4 MG/2ML IJ SOLN
4.0000 mg | Freq: Four times a day (QID) | INTRAMUSCULAR | Status: DC | PRN
  Filled 2014-08-22: qty 2

## 2014-08-22 MED ORDER — ACETAMINOPHEN 500 MG PO TABS
1000.0000 mg | ORAL_TABLET | Freq: Four times a day (QID) | ORAL | Status: AC
  Administered 2014-08-23 – 2014-08-27 (×17): 1000 mg via ORAL
  Filled 2014-08-22 (×18): qty 2

## 2014-08-22 MED ORDER — POTASSIUM CHLORIDE 10 MEQ/50ML IV SOLN
10.0000 meq | INTRAVENOUS | Status: AC
  Administered 2014-08-22 (×3): 10 meq via INTRAVENOUS

## 2014-08-22 MED ORDER — LACTATED RINGERS IV SOLN
INTRAVENOUS | Status: DC | PRN
  Administered 2014-08-22: 07:00:00 via INTRAVENOUS

## 2014-08-22 MED ORDER — SODIUM CHLORIDE 0.9 % IJ SOLN: 3.0000 mL | INTRAMUSCULAR | Status: DC | PRN

## 2014-08-22 MED ORDER — TRAMADOL HCL 50 MG PO TABS: 50.0000 mg | ORAL_TABLET | ORAL | Status: DC | PRN

## 2014-08-22 MED ORDER — HEMOSTATIC AGENTS (NO CHARGE) OPTIME
TOPICAL | Status: DC | PRN
  Administered 2014-08-22: 1 via TOPICAL

## 2014-08-22 MED ORDER — HEPARIN SODIUM (PORCINE) 1000 UNIT/ML IJ SOLN
INTRAMUSCULAR | Status: DC | PRN
  Administered 2014-08-22: 20000 [IU] via INTRAVENOUS
  Administered 2014-08-22: 5000 [IU] via INTRAVENOUS

## 2014-08-22 MED ORDER — ALBUMIN HUMAN 5 % IV SOLN
250.0000 mL | INTRAVENOUS | Status: AC | PRN
  Administered 2014-08-22 (×2): 250 mL via INTRAVENOUS

## 2014-08-22 MED ORDER — LACTATED RINGERS IV SOLN
INTRAVENOUS | Status: DC
  Administered 2014-08-22: 14:00:00 via INTRAVENOUS

## 2014-08-22 MED ORDER — SODIUM CHLORIDE 0.9 % IJ SOLN
OROMUCOSAL | Status: DC | PRN
  Administered 2014-08-22 (×3): 4 mL via TOPICAL

## 2014-08-22 MED ORDER — MIDAZOLAM HCL 10 MG/2ML IJ SOLN
INTRAMUSCULAR | Status: AC
  Filled 2014-08-22: qty 2

## 2014-08-22 MED ORDER — DOPAMINE-DEXTROSE 3.2-5 MG/ML-% IV SOLN: 0.0000 ug/kg/min | INTRAVENOUS | Status: DC

## 2014-08-22 MED ORDER — ACETAMINOPHEN 160 MG/5ML PO SOLN
1000.0000 mg | Freq: Four times a day (QID) | ORAL | Status: AC
  Filled 2014-08-22: qty 40

## 2014-08-22 MED ORDER — ACETAMINOPHEN 160 MG/5ML PO SOLN: 650.0000 mg | Freq: Once | ORAL | Status: AC

## 2014-08-22 MED ORDER — FENTANYL CITRATE (PF) 100 MCG/2ML IJ SOLN
25.0000 ug | INTRAMUSCULAR | Status: DC | PRN
  Administered 2014-08-22 – 2014-08-24 (×8): 50 ug via INTRAVENOUS
  Filled 2014-08-22 (×8): qty 2

## 2014-08-22 MED ORDER — DEXTROSE 5 % IV SOLN
1.5000 g | Freq: Two times a day (BID) | INTRAVENOUS | Status: AC
  Administered 2014-08-22 – 2014-08-24 (×4): 1.5 g via INTRAVENOUS
  Filled 2014-08-22 (×4): qty 1.5

## 2014-08-22 MED ORDER — ACETAMINOPHEN 650 MG RE SUPP
650.0000 mg | Freq: Once | RECTAL | Status: AC
  Administered 2014-08-22: 650 mg via RECTAL

## 2014-08-22 MED ORDER — MAGNESIUM SULFATE 4 GM/100ML IV SOLN
4.0000 g | Freq: Once | INTRAVENOUS | Status: AC
  Administered 2014-08-22: 4 g via INTRAVENOUS
  Filled 2014-08-22: qty 100

## 2014-08-22 MED ORDER — SODIUM CHLORIDE 0.9 % IV SOLN
INTRAVENOUS | Status: DC
  Administered 2014-08-22: 14:00:00 via INTRAVENOUS

## 2014-08-22 MED ORDER — ROCURONIUM BROMIDE 100 MG/10ML IV SOLN
INTRAVENOUS | Status: DC | PRN
  Administered 2014-08-22: 60 mg via INTRAVENOUS
  Administered 2014-08-22: 30 mg via INTRAVENOUS
  Administered 2014-08-22: 40 mg via INTRAVENOUS

## 2014-08-22 MED ORDER — PROPOFOL 10 MG/ML IV BOLUS
INTRAVENOUS | Status: AC
  Filled 2014-08-22: qty 20

## 2014-08-22 MED ORDER — HEPARIN SODIUM (PORCINE) 1000 UNIT/ML IJ SOLN
INTRAMUSCULAR | Status: AC
  Filled 2014-08-22: qty 1

## 2014-08-22 MED ORDER — FENTANYL CITRATE (PF) 100 MCG/2ML IJ SOLN
INTRAMUSCULAR | Status: DC | PRN
  Administered 2014-08-22: 25 ug via INTRAVENOUS
  Administered 2014-08-22 (×3): 250 ug via INTRAVENOUS
  Administered 2014-08-22: 100 ug via INTRAVENOUS
  Administered 2014-08-22: 25 ug via INTRAVENOUS
  Administered 2014-08-22: 50 ug via INTRAVENOUS
  Administered 2014-08-22: 500 ug via INTRAVENOUS
  Administered 2014-08-22: 400 ug via INTRAVENOUS
  Administered 2014-08-22: 150 ug via INTRAVENOUS

## 2014-08-22 MED ORDER — METOPROLOL TARTRATE 12.5 MG HALF TABLET
12.5000 mg | ORAL_TABLET | Freq: Two times a day (BID) | ORAL | Status: DC
  Administered 2014-08-23 – 2014-08-29 (×12): 12.5 mg via ORAL
  Filled 2014-08-22 (×15): qty 1

## 2014-08-22 MED ORDER — INSULIN REGULAR BOLUS VIA INFUSION
0.0000 [IU] | Freq: Three times a day (TID) | INTRAVENOUS | Status: DC
  Filled 2014-08-22: qty 10

## 2014-08-22 MED ORDER — METOPROLOL TARTRATE 1 MG/ML IV SOLN: 2.5000 mg | INTRAVENOUS | Status: DC | PRN

## 2014-08-22 MED ORDER — SODIUM CHLORIDE 0.9 % IJ SOLN
3.0000 mL | Freq: Two times a day (BID) | INTRAMUSCULAR | Status: DC
  Administered 2014-08-23: 3 mL via INTRAVENOUS

## 2014-08-22 MED ORDER — BISACODYL 10 MG RE SUPP: 10.0000 mg | Freq: Every day | RECTAL | Status: DC

## 2014-08-22 MED ORDER — MORPHINE SULFATE 2 MG/ML IJ SOLN: 1.0000 mg | INTRAMUSCULAR | Status: AC | PRN

## 2014-08-22 MED ORDER — SODIUM CHLORIDE 0.9 % IV SOLN
INTRAVENOUS | Status: DC
  Administered 2014-08-22: 1.1 [IU]/h via INTRAVENOUS
  Filled 2014-08-22: qty 2.5

## 2014-08-22 MED ORDER — FAMOTIDINE IN NACL 20-0.9 MG/50ML-% IV SOLN
20.0000 mg | Freq: Two times a day (BID) | INTRAVENOUS | Status: AC
  Administered 2014-08-22: 20 mg via INTRAVENOUS

## 2014-08-22 MED ORDER — BISACODYL 5 MG PO TBEC
10.0000 mg | DELAYED_RELEASE_TABLET | Freq: Every day | ORAL | Status: DC
  Administered 2014-08-23 – 2014-08-28 (×5): 10 mg via ORAL
  Filled 2014-08-22 (×6): qty 2

## 2014-08-22 MED ORDER — POTASSIUM CHLORIDE 10 MEQ/50ML IV SOLN
10.0000 meq | Freq: Once | INTRAVENOUS | Status: AC
  Administered 2014-08-22: 10 meq via INTRAVENOUS

## 2014-08-22 MED ORDER — LACTATED RINGERS IV SOLN: 500.0000 mL | Freq: Once | INTRAVENOUS | Status: AC | PRN

## 2014-08-22 MED ORDER — MIDAZOLAM HCL 2 MG/2ML IJ SOLN: 2.0000 mg | INTRAMUSCULAR | Status: DC | PRN

## 2014-08-22 MED ORDER — DEXTROSE 5 % IV SOLN
0.0000 ug/min | INTRAVENOUS | Status: DC
  Filled 2014-08-22: qty 2

## 2014-08-22 MED ORDER — PANTOPRAZOLE SODIUM 40 MG PO TBEC
40.0000 mg | DELAYED_RELEASE_TABLET | Freq: Every day | ORAL | Status: DC
  Administered 2014-08-23 – 2014-08-29 (×7): 40 mg via ORAL
  Filled 2014-08-22 (×7): qty 1

## 2014-08-22 MED ORDER — EPHEDRINE SULFATE 50 MG/ML IJ SOLN
INTRAMUSCULAR | Status: AC
  Filled 2014-08-22: qty 1

## 2014-08-22 MED ORDER — DOPAMINE-DEXTROSE 3.2-5 MG/ML-% IV SOLN
INTRAVENOUS | Status: DC | PRN
  Administered 2014-08-22: 3 ug/kg/min via INTRAVENOUS

## 2014-08-22 MED ORDER — SODIUM CHLORIDE 0.9 % IJ SOLN
INTRAMUSCULAR | Status: AC
  Filled 2014-08-22: qty 20

## 2014-08-22 MED ORDER — PROPOFOL 10 MG/ML IV BOLUS
INTRAVENOUS | Status: DC | PRN
  Administered 2014-08-22: 100 mg via INTRAVENOUS

## 2014-08-22 MED ORDER — SUCCINYLCHOLINE CHLORIDE 20 MG/ML IJ SOLN
INTRAMUSCULAR | Status: AC
  Filled 2014-08-22: qty 1

## 2014-08-22 MED ORDER — DOCUSATE SODIUM 100 MG PO CAPS
200.0000 mg | ORAL_CAPSULE | Freq: Every day | ORAL | Status: DC
  Administered 2014-08-23 – 2014-08-29 (×7): 200 mg via ORAL
  Filled 2014-08-22 (×7): qty 2

## 2014-08-22 MED ORDER — LIDOCAINE HCL (CARDIAC) 20 MG/ML IV SOLN
INTRAVENOUS | Status: AC
  Filled 2014-08-22: qty 5

## 2014-08-22 MED ORDER — ASPIRIN 81 MG PO CHEW: 324.0000 mg | CHEWABLE_TABLET | Freq: Every day | ORAL | Status: DC

## 2014-08-22 MED ORDER — ASPIRIN EC 325 MG PO TBEC
325.0000 mg | DELAYED_RELEASE_TABLET | Freq: Every day | ORAL | Status: DC
  Administered 2014-08-23 – 2014-08-29 (×7): 325 mg via ORAL
  Filled 2014-08-22 (×7): qty 1

## 2014-08-22 MED ORDER — MIDAZOLAM HCL 5 MG/5ML IJ SOLN
INTRAMUSCULAR | Status: DC | PRN
  Administered 2014-08-22: 3 mg via INTRAVENOUS
  Administered 2014-08-22: 1 mg via INTRAVENOUS
  Administered 2014-08-22: 2 mg via INTRAVENOUS
  Administered 2014-08-22: 1 mg via INTRAVENOUS
  Administered 2014-08-22: 3 mg via INTRAVENOUS

## 2014-08-22 MED ORDER — LACTATED RINGERS IV SOLN
INTRAVENOUS | Status: DC | PRN
  Administered 2014-08-22 (×2): via INTRAVENOUS

## 2014-08-22 MED ORDER — DEXMEDETOMIDINE HCL IN NACL 200 MCG/50ML IV SOLN: 0.0000 ug/kg/h | INTRAVENOUS | Status: DC

## 2014-08-22 MED FILL — Lidocaine HCl IV Inj 20 MG/ML: INTRAVENOUS | Qty: 5 | Status: AC

## 2014-08-22 MED FILL — Sodium Chloride IV Soln 0.9%: INTRAVENOUS | Qty: 2000 | Status: AC

## 2014-08-22 MED FILL — Mannitol IV Soln 20%: INTRAVENOUS | Qty: 500 | Status: AC

## 2014-08-22 MED FILL — Sodium Bicarbonate IV Soln 8.4%: INTRAVENOUS | Qty: 50 | Status: AC

## 2014-08-22 MED FILL — Electrolyte-R (PH 7.4) Solution: INTRAVENOUS | Qty: 4000 | Status: AC

## 2014-08-22 MED FILL — Heparin Sodium (Porcine) Inj 1000 Unit/ML: INTRAMUSCULAR | Qty: 10 | Status: AC

## 2014-08-22 SURGICAL SUPPLY — 110 items
ADAPTER CARDIO PERF ANTE/RETRO (ADAPTER) ×4 IMPLANT
APPLICATOR COTTON TIP 6IN STRL (MISCELLANEOUS) IMPLANT
BAG DECANTER FOR FLEXI CONT (MISCELLANEOUS) ×4 IMPLANT
BANDAGE ELASTIC 4 VELCRO ST LF (GAUZE/BANDAGES/DRESSINGS) IMPLANT
BANDAGE ELASTIC 6 VELCRO ST LF (GAUZE/BANDAGES/DRESSINGS) IMPLANT
BASKET HEART  (ORDER IN 25'S) (MISCELLANEOUS) ×1
BASKET HEART (ORDER IN 25'S) (MISCELLANEOUS) ×1
BASKET HEART (ORDER IN 25S) (MISCELLANEOUS) ×2 IMPLANT
BLADE STERNUM SYSTEM 6 (BLADE) ×4 IMPLANT
BLADE SURG 11 STRL SS (BLADE) ×4 IMPLANT
BLADE SURG 15 STRL LF DISP TIS (BLADE) ×4 IMPLANT
BLADE SURG 15 STRL SS (BLADE) ×4
BNDG GAUZE ELAST 4 BULKY (GAUZE/BANDAGES/DRESSINGS) IMPLANT
CANISTER SUCTION 2500CC (MISCELLANEOUS) ×4 IMPLANT
CANNULA EZ GLIDE AORTIC 21FR (CANNULA) ×4 IMPLANT
CANNULA GUNDRY RCSP 15FR (MISCELLANEOUS) ×4 IMPLANT
CATH CPB KIT HENDRICKSON (MISCELLANEOUS) ×4 IMPLANT
CATH HEART VENT LEFT (CATHETERS) ×2 IMPLANT
CATH ROBINSON RED A/P 18FR (CATHETERS) ×8 IMPLANT
CATH THORACIC 36FR (CATHETERS) ×4 IMPLANT
CATH THORACIC 36FR RT ANG (CATHETERS) ×8 IMPLANT
CLIP FOGARTY SPRING 6M (CLIP) IMPLANT
CLIP TI MEDIUM 24 (CLIP) IMPLANT
CLIP TI WIDE RED SMALL 24 (CLIP) ×4 IMPLANT
CONT SPEC 4OZ CLIKSEAL STRL BL (MISCELLANEOUS) ×4 IMPLANT
CRADLE DONUT ADULT HEAD (MISCELLANEOUS) ×4 IMPLANT
DRAPE CARDIOVASCULAR INCISE (DRAPES) ×2
DRAPE SLUSH/WARMER DISC (DRAPES) ×4 IMPLANT
DRAPE SRG 135X102X78XABS (DRAPES) ×2 IMPLANT
DRSG COVADERM 4X14 (GAUZE/BANDAGES/DRESSINGS) ×4 IMPLANT
ELECT REM PT RETURN 9FT ADLT (ELECTROSURGICAL) ×8
ELECTRODE REM PT RTRN 9FT ADLT (ELECTROSURGICAL) ×4 IMPLANT
GAUZE SPONGE 4X4 12PLY STRL (GAUZE/BANDAGES/DRESSINGS) ×4 IMPLANT
GLOVE BIO SURGEON STRL SZ 6 (GLOVE) ×8 IMPLANT
GLOVE BIOGEL PI IND STRL 6 (GLOVE) ×6 IMPLANT
GLOVE BIOGEL PI IND STRL 7.0 (GLOVE) ×6 IMPLANT
GLOVE BIOGEL PI INDICATOR 6 (GLOVE) ×6
GLOVE BIOGEL PI INDICATOR 7.0 (GLOVE) ×6
GLOVE SURG SIGNA 7.5 PF LTX (GLOVE) ×12 IMPLANT
GOWN STRL REUS W/ TWL LRG LVL3 (GOWN DISPOSABLE) ×10 IMPLANT
GOWN STRL REUS W/ TWL XL LVL3 (GOWN DISPOSABLE) ×4 IMPLANT
GOWN STRL REUS W/TWL LRG LVL3 (GOWN DISPOSABLE) ×10
GOWN STRL REUS W/TWL XL LVL3 (GOWN DISPOSABLE) ×4
HEMOSTAT POWDER SURGIFOAM 1G (HEMOSTASIS) ×12 IMPLANT
HEMOSTAT SURGICEL 2X14 (HEMOSTASIS) ×4 IMPLANT
INSERT FOGARTY XLG (MISCELLANEOUS) IMPLANT
IV CATH 18G X1.75 CATHLON (IV SOLUTION) ×4 IMPLANT
KIT BASIN OR (CUSTOM PROCEDURE TRAY) ×4 IMPLANT
KIT ROOM TURNOVER OR (KITS) ×4 IMPLANT
KIT SUCTION CATH 14FR (SUCTIONS) ×8 IMPLANT
KIT VASOVIEW W/TROCAR VH 2000 (KITS) ×4 IMPLANT
LINE VENT (MISCELLANEOUS) ×4 IMPLANT
LOOP VESSEL SUPERMAXI WHITE (MISCELLANEOUS) ×4 IMPLANT
MARKER GRAFT CORONARY BYPASS (MISCELLANEOUS) ×12 IMPLANT
NS IRRIG 1000ML POUR BTL (IV SOLUTION) ×24 IMPLANT
PACK OPEN HEART (CUSTOM PROCEDURE TRAY) ×4 IMPLANT
PAD ARMBOARD 7.5X6 YLW CONV (MISCELLANEOUS) ×8 IMPLANT
PAD ELECT DEFIB RADIOL ZOLL (MISCELLANEOUS) ×4 IMPLANT
PENCIL BUTTON HOLSTER BLD 10FT (ELECTRODE) ×4 IMPLANT
PUNCH AORTIC ROT 4.0MM RCL 40 (MISCELLANEOUS) ×4 IMPLANT
PUNCH AORTIC ROTATE 4.0MM (MISCELLANEOUS) IMPLANT
PUNCH AORTIC ROTATE 4.5MM 8IN (MISCELLANEOUS) IMPLANT
PUNCH AORTIC ROTATE 5MM 8IN (MISCELLANEOUS) IMPLANT
SET CARDIOPLEGIA MPS 5001102 (MISCELLANEOUS) ×4 IMPLANT
SUT BONE WAX W31G (SUTURE) ×4 IMPLANT
SUT ETHIBON 2 0 V 52N 30 (SUTURE) ×8 IMPLANT
SUT ETHIBON EXCEL 2-0 V-5 (SUTURE) IMPLANT
SUT ETHIBOND 2 0 SH (SUTURE) ×4
SUT ETHIBOND 2 0 SH 36X2 (SUTURE) ×4 IMPLANT
SUT ETHIBOND 2 0 V4 (SUTURE) IMPLANT
SUT ETHIBOND 2 0V4 GREEN (SUTURE) IMPLANT
SUT ETHIBOND 4 0 RB 1 (SUTURE) IMPLANT
SUT ETHIBOND NAB MH 2-0 36IN (SUTURE) ×4 IMPLANT
SUT ETHIBOND V-5 VALVE (SUTURE) IMPLANT
SUT MNCRL AB 4-0 PS2 18 (SUTURE) IMPLANT
SUT PROLENE 3 0 SH 1 (SUTURE) IMPLANT
SUT PROLENE 3 0 SH DA (SUTURE) ×4 IMPLANT
SUT PROLENE 4 0 RB 1 (SUTURE) ×20
SUT PROLENE 4 0 SH DA (SUTURE) IMPLANT
SUT PROLENE 4-0 RB1 .5 CRCL 36 (SUTURE) ×20 IMPLANT
SUT PROLENE 6 0 C 1 30 (SUTURE) ×8 IMPLANT
SUT PROLENE 7 0 BV1 MDA (SUTURE) ×4 IMPLANT
SUT PROLENE 8 0 BV175 6 (SUTURE) ×4 IMPLANT
SUT SILK  1 MH (SUTURE) ×4
SUT SILK 1 MH (SUTURE) ×4 IMPLANT
SUT STEEL 6MS V (SUTURE) ×4 IMPLANT
SUT STEEL STERNAL CCS#1 18IN (SUTURE) IMPLANT
SUT STEEL SZ 6 DBL 3X14 BALL (SUTURE) ×4 IMPLANT
SUT VIC AB 1 CTX 36 (SUTURE) ×4
SUT VIC AB 1 CTX36XBRD ANBCTR (SUTURE) ×4 IMPLANT
SUT VIC AB 2-0 CT1 27 (SUTURE)
SUT VIC AB 2-0 CT1 TAPERPNT 27 (SUTURE) IMPLANT
SUT VIC AB 2-0 CTX 27 (SUTURE) IMPLANT
SUT VIC AB 3-0 SH 27 (SUTURE)
SUT VIC AB 3-0 SH 27X BRD (SUTURE) IMPLANT
SUT VIC AB 3-0 X1 27 (SUTURE) IMPLANT
SUT VICRYL 4-0 PS2 18IN ABS (SUTURE) IMPLANT
SUTURE E-PAK OPEN HEART (SUTURE) ×4 IMPLANT
SYR 10ML KIT SKIN ADHESIVE (MISCELLANEOUS) IMPLANT
SYSTEM SAHARA CHEST DRAIN ATS (WOUND CARE) ×4 IMPLANT
TAPE CLOTH SURG 4X10 WHT LF (GAUZE/BANDAGES/DRESSINGS) ×4 IMPLANT
TOWEL OR 17X24 6PK STRL BLUE (TOWEL DISPOSABLE) ×8 IMPLANT
TOWEL OR 17X26 10 PK STRL BLUE (TOWEL DISPOSABLE) ×8 IMPLANT
TRAY FOLEY IC TEMP SENS 16FR (CATHETERS) ×4 IMPLANT
TUBE FEEDING 8FR 16IN STR KANG (MISCELLANEOUS) ×4 IMPLANT
TUBING INSUFFLATION (TUBING) ×4 IMPLANT
UNDERPAD 30X30 INCONTINENT (UNDERPADS AND DIAPERS) ×4 IMPLANT
VALVE MAGNA EASE 21MM (Prosthesis & Implant Heart) ×4 IMPLANT
VENT LEFT HEART 12002 (CATHETERS) ×4
WATER STERILE IRR 1000ML POUR (IV SOLUTION) ×8 IMPLANT

## 2014-08-22 NOTE — Interval H&P Note (Signed)
History and Physical Interval Note:  08/22/2014 7:22 AM  Sharon Hubbard  has presented today for surgery, with the diagnosis of CAD  The various methods of treatment have been discussed with the patient and family. After consideration of risks, benefits and other options for treatment, the patient has consented to  Procedure(s): CORONARY ARTERY BYPASS GRAFTING (CABG) (N/A) AORTIC VALVE REPLACEMENT (AVR) (N/A) TRANSESOPHAGEAL ECHOCARDIOGRAM (TEE) (N/A) as a surgical intervention .  The patient's history has been reviewed, patient examined, no change in status, stable for surgery.  I have reviewed the patient's chart and labs.  Questions were answered to the patient's satisfaction.     Melrose Nakayama

## 2014-08-22 NOTE — Anesthesia Procedure Notes (Addendum)
Date/Time: 08/22/2014 8:32 AM Performed by: Izora Gala    Procedure Name: Intubation Date/Time: 08/22/2014 8:00 AM Performed by: Izora Gala Pre-anesthesia Checklist: Patient identified, Emergency Drugs available, Suction available and Patient being monitored Patient Re-evaluated:Patient Re-evaluated prior to inductionOxygen Delivery Method: Circle system utilized Preoxygenation: Pre-oxygenation with 100% oxygen Intubation Type: IV induction Ventilation: Mask ventilation without difficulty Laryngoscope Size: Miller and 3 Grade View: Grade I Tube type: Oral Tube size: 7.0 mm Number of attempts: 1 Airway Equipment and Method: Stylet Secured at: 21 cm Tube secured with: Tape Dental Injury: Teeth and Oropharynx as per pre-operative assessment

## 2014-08-22 NOTE — OR Nursing (Signed)
One hour call to SICU charge nurse at 1207.

## 2014-08-22 NOTE — Anesthesia Preprocedure Evaluation (Addendum)
Anesthesia Evaluation  Patient identified by MRN, date of birth, ID band Patient awake    Reviewed: Allergy & Precautions, NPO status , Patient's Chart, lab work & pertinent test results  Airway Mallampati: I  TM Distance: >3 FB Neck ROM: Full    Dental   Pulmonary    Pulmonary exam normal       Cardiovascular hypertension, Pt. on medications + CAD and + Past MI Normal cardiovascular exam ECHO 7/16 Study Conclusions  - Left ventricle: The cavity size was normal. Wall thickness was normal. Systolic function was normal. The estimated ejection fraction was in the range of 55% to 60%. Wall motion was normal; there were no regional wall motion abnormalities. Doppler parameters are consistent with abnormal left ventricular relaxation (grade 1 diastolic dysfunction). Doppler parameters are consistent with high ventricular filling pressure. - Aortic valve: Valve mobility was restricted. There was moderate stenosis. Valve area (VTI): 0.94 cm^2. Valve area (Vmax): 0.89 cm^2. Valve area (Vmean): 0.78 cm^2. - Mitral valve: Calcified annulus.  Impressions:  - Normal LV function; grade 1 diastolic dysfunction; elevated LV filling pressure; heavily calcified aortic valve with moderate AS by mean gradient (23 mmHg) and severe by AVA using continuity equation; visually, appears severe; MAC with trace MR. Compared to 02/03/14, mean gradient increased from 20 to 23 mmHg.    Neuro/Psych    GI/Hepatic GERD-  Medicated and Controlled,  Endo/Other  diabetes, Type 2, Oral Hypoglycemic Agents  Renal/GU CRFRenal disease     Musculoskeletal   Abdominal   Peds  Hematology   Anesthesia Other Findings   Reproductive/Obstetrics                            Anesthesia Physical Anesthesia Plan  ASA: III  Anesthesia Plan: General   Post-op Pain Management:    Induction:  Intravenous  Airway Management Planned: Oral ETT  Additional Equipment: Arterial line, CVP, PA Cath, TEE and Ultrasound Guidance Line Placement  Intra-op Plan:   Post-operative Plan: Post-operative intubation/ventilation  Informed Consent: I have reviewed the patients History and Physical, chart, labs and discussed the procedure including the risks, benefits and alternatives for the proposed anesthesia with the patient or authorized representative who has indicated his/her understanding and acceptance.     Plan Discussed with: CRNA and Surgeon  Anesthesia Plan Comments:         Anesthesia Quick Evaluation

## 2014-08-22 NOTE — Progress Notes (Signed)
  Echocardiogram Echocardiogram Transesophageal has been performed.  Sharon Hubbard 08/22/2014, 9:07 AM

## 2014-08-22 NOTE — Transfer of Care (Signed)
Immediate Anesthesia Transfer of Care Note  Patient: Sharon Hubbard  Procedure(s) Performed: Procedure(s): CORONARY ARTERY BYPASS GRAFTING (CABG), ON PUMP, TIMES ONE, USING RIGHT INTERNAL MAMMARY ARTERY (N/A) AORTIC VALVE REPLACEMENT (AVR) (N/A) TRANSESOPHAGEAL ECHOCARDIOGRAM (TEE) (N/A)  Patient Location: PACU  Anesthesia Type:General  Level of Consciousness: Patient remains intubated per anesthesia plan  Airway & Oxygen Therapy: Patient remains intubated per anesthesia plan and Patient placed on Ventilator (see vital sign flow sheet for setting)  Post-op Assessment: Report given to RN  Post vital signs: Reviewed and stable  Last Vitals:  Filed Vitals:   08/22/14 0358  BP: 135/65  Pulse: 76  Temp: 36.7 C  Resp: 18    Complications: No apparent anesthesia complications

## 2014-08-22 NOTE — OR Nursing (Signed)
Small skin tear noted on right forearm from stopcock on IV tubing when drapes were removed. Cleansed with saline. Opsite applied.

## 2014-08-22 NOTE — H&P (View-Only) (Signed)
Reason for Consult:Single vessel CAD with unstable angina Referring Physician: Dr. Greig Castilla  Sharon Hubbard is an 81 y.o. female.  HPI: 81 yo woman who presents with a cc/o chest pain  Sharon Hubbard is a 81 yo woman with a known history od CAN. She had a stent placed in her circumflex in 2011 and a DES placed in the LAD in 2012. Her past history is also significant for moderate AS, diastolic CHF, type II diabetes, stage III CKD, spinal stenosis, hypertension, steroid dependent polymyalgia rheumatica, and TIAs.   She has been having substernal chest pain radiating to her left arm for the past 6-8 weeks. She has noticed this occurs several times a week and has been getting more frequent leading up to admission. It sometimes occurs at rest. It usually resolves with one SL NTG but sometimes it takes 2 of those. On the day of admission she had a severe episode that did not resolve and called EMS. They gave her 2 more NTG and the pain finally resolved after about 45 minutes.  She was taken to [facility]. Her initial troponin was negative, but he ECG showed anterolateral ST depression. She ruled out for MI.  She lives at home with her husband. She is relatively active doing cooking and cleaning as well as going to [employer] on a regular basis. She had noted swelling in her legs a couple of weeks ago but that improved with increasing lasix to 60 mg/day.  Her creatinine was elevated on admission at 1.75 but now is back to her baseline 1.4.  She had 2 attempts to attempt angioplasty on the vessel but in both cases a wire could not be passed.   Past Medical History  Diagnosis Date  . CAD (coronary artery disease)     Interventions in the past  . Groin hematoma     April, 2011  . Hypertension   . Diabetes mellitus   . Dyslipidemia   . GERD (gastroesophageal reflux disease)   . Schatzki's ring   . Renal insufficiency   . Polymyalgia rheumatica   . Osteoarthritis     arthroscopic surgery  right knee February, 2012  . Spinal stenosis     history of surgery for this  . Ejection fraction     EF 65%, echo, April, 2012, aortic valve sclerosis with very slight gradient  . Preoperative evaluation to rule out surgical contraindication     Patient needs back surgery by Dr. Carloyn Manner, May, 2012                . Pancreas cyst     The patient has multiple pancreatic cysts.  This is followed at [facility]         . Complication of anesthesia     "felt like I was smothering once with mask on my face"  . RBBB (right bundle branch block)     old  . Bradycardia, sinus   . Myocardial infarction 2011; 08/2010  . Shortness of breath on exertion   . Blood transfusion   . Anemia   . Stroke 01/18/11    "I've had 2 TIA's"  . Cancer     ""had hysterectomy for a 6 showing cancer; think that's pretty strong  . Aortic stenosis     very mild, echo 12/2010  . Femoral bruit     01/2011 hosp, but no pseudoaneurysm or AV fistula    Past Surgical History  Procedure Laterality Date  . Cholecystectomy  1987  .  Appendectomy    . Back surgery    . Lumbar spine surgery  ~ 1977; ~ 2010    "2 hugh ruptured discs; I was paralyzed first OR; 2nd OR by Dr. Carloyn Manner, don't know what for"  . Tubal ligation  1970's  . Dilation and curettage of uterus    . Abdominal hysterectomy  ~ 1974  . Knee arthroscopy  02/2010    right knee; chrondoplasty medial femoral condyle;  Partial medial meniscectomy.   . Coronary angioplasty with stent placement  2011    "in [place]"  . Coronary angioplasty with stent placement  08/2010; 03/25/2013  . Cardiac catheterization  01/18/11  . Femoral artery exploration Right 03/26/2013    Procedure: FEMORAL ARTERY EXPLORATION WITH SUTURE REPAIR; EVACUATION OF HEMATOMA;  Surgeon: Mal Misty, MD;  Location: [place];  Service: Vascular;  Laterality: Right;  . Coronary angiogram  01/18/2011    Procedure: CORONARY ANGIOGRAM;  Surgeon: Larey Dresser, MD;  Location: [facility] CATH LAB;  Service:  Cardiovascular;;  . Left and right heart catheterization with coronary angiogram N/A 03/25/2013    Procedure: LEFT AND RIGHT HEART CATHETERIZATION WITH CORONARY ANGIOGRAM;  Surgeon: Burnell Blanks, MD;  Location: [facility] CATH LAB;  Service: Cardiovascular;  Laterality: N/A;  . Cardiac catheterization N/A 08/16/2014    Procedure: Left Heart Cath and Coronary Angiography;  Surgeon: Leonie Man, MD;  Location: [place] CV LAB;  Service: Cardiovascular;  Laterality: N/A;  . Cardiac catheterization N/A 08/16/2014    Procedure: Coronary Stent Intervention;  Surgeon: Leonie Man, MD;  Location: [place] CV LAB;  Service: Cardiovascular;  Laterality: N/A;  . Cardiac catheterization N/A 08/17/2014    Procedure: Coronary/Graft Atherectomy;  Surgeon: Burnell Blanks, MD;  Location: [place] CV LAB;  Service: Cardiovascular;  Laterality: N/A;    Family History  Problem Relation Age of Onset  . Uterine cancer Mother 43  . Other Father 12    cervical fracture    Social History:  reports that she has never smoked. She has never used smokeless tobacco. She reports that she does not drink alcohol or use illicit drugs.  Allergies:  Allergies  Allergen Reactions  . Clarithromycin     REACTION: Vomiting and diarrhea.  . Codeine   . Sulfa Antibiotics   . Atorvastatin Other (See Comments)    Made her knees buckle and muscles ache    Medications:  Scheduled: . amLODipine  10 mg Oral Daily  . aspirin EC  81 mg Oral Daily  . divalproex  500 mg Oral Daily  . docusate sodium  100 mg Oral BID  . insulin aspart  0-15 Units Subcutaneous TID WC  . insulin aspart  0-5 Units Subcutaneous QHS  . isosorbide mononitrate  90 mg Oral QHS  . latanoprost  1 drop Both Eyes QHS  . mirabegron ER  25 mg Oral Daily  . pantoprazole  40 mg Oral Daily  . predniSONE  4 mg Oral Q breakfast  . rosuvastatin  20 mg Oral Daily  . sodium chloride  3 mL Intravenous Q12H  . sodium chloride  3 mL  Intravenous Q12H  . timolol  1 drop Both Eyes Daily    Results for orders placed or performed during the hospital encounter of 08/15/14 (from the past 48 hour(s))  Glucose, capillary     Status: Abnormal   Collection Time: 08/16/14  3:08 PM  Result Value Ref Range   Glucose-Capillary 170 (H) 65 - 99 mg/dL  Troponin I     Status: Abnormal   Collection Time: 08/16/14  4:30 PM  Result Value Ref Range   Troponin I 0.13 (H) <0.031 ng/mL    Comment:        PERSISTENTLY INCREASED TROPONIN VALUES IN THE RANGE OF 0.04-0.49 ng/mL CAN BE SEEN IN:       -UNSTABLE ANGINA       -CONGESTIVE HEART FAILURE       -MYOCARDITIS       -CHEST TRAUMA       -ARRYHTHMIAS       -LATE PRESENTING MYOCARDIAL INFARCTION       -COPD   CLINICAL FOLLOW-UP RECOMMENDED.   Glucose, capillary     Status: Abnormal   Collection Time: 08/16/14  5:18 PM  Result Value Ref Range   Glucose-Capillary 294 (H) 65 - 99 mg/dL  Glucose, capillary     Status: Abnormal   Collection Time: 08/16/14  9:47 PM  Result Value Ref Range   Glucose-Capillary 178 (H) 65 - 99 mg/dL   Comment 1 Notify RN    Comment 2 Document in Chart   Basic metabolic panel     Status: Abnormal   Collection Time: 08/17/14  3:07 AM  Result Value Ref Range   Sodium 140 135 - 145 mmol/L   Potassium 3.8 3.5 - 5.1 mmol/L   Chloride 106 101 - 111 mmol/L   CO2 25 22 - 32 mmol/L   Glucose, Bld 139 (H) 65 - 99 mg/dL   BUN 21 (H) 6 - 20 mg/dL   Creatinine, Ser 1.35 (H) 0.44 - 1.00 mg/dL   Calcium 8.4 (L) 8.9 - 10.3 mg/dL   GFR calc non Af Amer 36 (L) >60 mL/min   GFR calc Af Amer 42 (L) >60 mL/min    Comment: (NOTE) The eGFR has been calculated using the CKD EPI equation. This calculation has not been validated in all clinical situations. eGFR's persistently <60 mL/min signify possible Chronic Kidney Disease.    Anion gap 9 5 - 15  CBC     Status: Abnormal   Collection Time: 08/17/14  3:07 AM  Result Value Ref Range   WBC 5.0 4.0 - 10.5 K/uL    RBC 3.46 (L) 3.87 - 5.11 MIL/uL   Hemoglobin 10.6 (L) 12.0 - 15.0 g/dL   HCT 31.9 (L) 36.0 - 46.0 %   MCV 92.2 78.0 - 100.0 fL   MCH 30.6 26.0 - 34.0 pg   MCHC 33.2 30.0 - 36.0 g/dL   RDW 15.1 11.5 - 15.5 %   Platelets 100 (L) 150 - 400 K/uL    Comment: CONSISTENT WITH PREVIOUS RESULT  Glucose, capillary     Status: Abnormal   Collection Time: 08/17/14  6:48 AM  Result Value Ref Range   Glucose-Capillary 117 (H) 65 - 99 mg/dL   Comment 1 Notify RN    Comment 2 Document in Chart   Heparin level (unfractionated)     Status: None   Collection Time: 08/17/14 11:04 AM  Result Value Ref Range   Heparin Unfractionated 0.31 0.30 - 0.70 IU/mL    Comment:        IF HEPARIN RESULTS ARE BELOW EXPECTED VALUES, AND PATIENT DOSAGE HAS BEEN CONFIRMED, SUGGEST FOLLOW UP TESTING OF ANTITHROMBIN III LEVELS.   Glucose, capillary     Status: Abnormal   Collection Time: 08/17/14  1:28 PM  Result Value Ref Range   Glucose-Capillary 155 (H) 65 - 99 mg/dL  POCT Activated  clotting time     Status: None   Collection Time: 08/17/14  2:45 PM  Result Value Ref Range   Activated Clotting Time 343 seconds  Glucose, capillary     Status: Abnormal   Collection Time: 08/17/14  6:12 PM  Result Value Ref Range   Glucose-Capillary 158 (H) 65 - 99 mg/dL  Glucose, capillary     Status: Abnormal   Collection Time: 08/17/14  9:55 PM  Result Value Ref Range   Glucose-Capillary 185 (H) 65 - 99 mg/dL   Comment 1 Notify RN    Comment 2 Document in Chart   Basic metabolic panel     Status: Abnormal   Collection Time: 08/18/14  2:58 AM  Result Value Ref Range   Sodium 140 135 - 145 mmol/L   Potassium 3.9 3.5 - 5.1 mmol/L   Chloride 105 101 - 111 mmol/L   CO2 28 22 - 32 mmol/L   Glucose, Bld 129 (H) 65 - 99 mg/dL   BUN 16 6 - 20 mg/dL   Creatinine, Ser 1.31 (H) 0.44 - 1.00 mg/dL   Calcium 8.9 8.9 - 10.3 mg/dL   GFR calc non Af Amer 38 (L) >60 mL/min   GFR calc Af Amer 44 (L) >60 mL/min    Comment:  (NOTE) The eGFR has been calculated using the CKD EPI equation. This calculation has not been validated in all clinical situations. eGFR's persistently <60 mL/min signify possible Chronic Kidney Disease.    Anion gap 7 5 - 15  Platelet inhibition p2y12     Status: None   Collection Time: 08/18/14  2:58 AM  Result Value Ref Range   Platelet Function  P2Y12 233 194 - 418 PRU  CBC     Status: Abnormal   Collection Time: 08/18/14  2:58 AM  Result Value Ref Range   WBC 6.6 4.0 - 10.5 K/uL   RBC 3.42 (L) 3.87 - 5.11 MIL/uL   Hemoglobin 10.5 (L) 12.0 - 15.0 g/dL   HCT 31.4 (L) 36.0 - 46.0 %   MCV 91.8 78.0 - 100.0 fL   MCH 30.7 26.0 - 34.0 pg   MCHC 33.4 30.0 - 36.0 g/dL   RDW 15.0 11.5 - 15.5 %   Platelets 99 (L) 150 - 400 K/uL    Comment: CONSISTENT WITH PREVIOUS RESULT  Glucose, capillary     Status: Abnormal   Collection Time: 08/18/14  6:17 AM  Result Value Ref Range   Glucose-Capillary 127 (H) 65 - 99 mg/dL  Glucose, capillary     Status: Abnormal   Collection Time: 08/18/14 12:14 PM  Result Value Ref Range   Glucose-Capillary 181 (H) 65 - 99 mg/dL    No results found.  Review of Systems  Constitutional: Positive for malaise/fatigue. Negative for fever and chills.  Respiratory: Positive for shortness of breath. Negative for hemoptysis and wheezing.   Cardiovascular: Positive for chest pain and leg swelling. Negative for claudication.  Gastrointestinal: Positive for heartburn. Negative for blood in stool.       Difficulty swallowing  Genitourinary: Negative for hematuria.  Musculoskeletal: Positive for myalgias and joint pain.  Neurological: Negative for speech change and focal weakness.  Endo/Heme/Allergies: Bruises/bleeds easily.  All other systems reviewed and are negative.  Blood pressure 119/54, pulse 70, temperature 98 F (36.7 C), temperature source Oral, resp. rate 14, height '5\' 3"'  (1.6 m), weight 135 lb 12.9 oz (61.6 kg), SpO2 97 %. Physical Exam  Vitals  reviewed. Constitutional: She is oriented  to person, place, and time. She appears well-developed and well-nourished. No distress.  Elderly  HENT:  Head: Normocephalic and atraumatic.  Eyes: Conjunctivae and EOM are normal. No scleral icterus.  Neck: Neck supple. No tracheal deviation present. No thyromegaly present.  Transmitted murmur bilaterally  Cardiovascular: Normal rate and regular rhythm.   Murmur (2/6 crescendo/ decrescendo RUSB) heard. Unable to palpate DP or PT  Respiratory: Effort normal and breath sounds normal. She has no wheezes. She has no rales.  GI: Soft. She exhibits no distension. There is no tenderness.  Musculoskeletal: Normal range of motion. She exhibits no edema.  Lymphadenopathy:    She has no cervical adenopathy.  Neurological: She is alert and oriented to person, place, and time. No cranial nerve deficit. She exhibits normal muscle tone. Coordination normal.  Skin: Skin is warm and dry.  Psychiatric: She has a normal mood and affect.   CARDIAC CATH 08/16/14 Conclusion    1. Prox RCA to Mid RCA lesion, 99% stenosed. Unable to cross with wire. Very difficult guide support. Attempted multiple different catheters, unable to use more than 5 [place] guide from radial access. 2. Widely patent stents in the LAD and circumflex 3. Likely mild aortic stenosis  Severe multivessel disease now with progression of RCA disease noted in 2015 from roughly 40% to 99% subtotal occlusion that is heavily calcified and unable to cross with wires from radial access.  Recommendations:  Patient was transferred for to [facility] post procedure unit for TR band removal.  Restart IV heparin 6-8 hours post TR band removal.  Planned staged PCI via rotational atherectomy tomorrow   I spent close to 20 minutes explaining the procedure results and future plans to the patient's son.   Leonie Man, M.D., M.S.   I personally reviewed the cath films from 7/26 and 7/27 and concur with the  findings above. In addition she has significant egg shell calcification of her proximal ascending aorta, which is also visible on the abdominal CT from several years ago.  Assessment/Plan: 81 yo woman with severe single vessel CAD and unstable angina. She also has moderate AS by echo in January.   Unfortunately the lesion could not be stented with failure to pass a wire on 2 attempts. The options for treatment are medical therapy v CABG (RIMA to RCA). Neither option is ideal, but CABG is more likely to result in symptom relief, as she was already on a good medical regimen prior to admission. Consideration could be given to [place] if she opt for medical therapy.  She has multiple factors that increase her risk for surgery- advanced age, CKD, chronic steroids, DM, history of TIA, and ascending aortic atherosclerotic disease (egg shell calcification).  I had a long discussion with Mrs. Schoeller and her family re: CABG. There is no survival benefit in this setting, so the indication is for relief of symptoms. I think there is a 90% chance of success with that.  I discussed the general nature of the procedure, the need for general anesthesia, the use of cardiopulmonary bypass, and the incisions to be used. We discussed the expected hospital stay, overall recovery and short and long term outcomes. I reviewed the indications, risks, benefits and alternatives. They understand the risks include, but are not limited to death, stroke, MI, DVT/PE, bleeding, possible need for transfusion, infections,other organ system dysfunction including respiratory, renal, or GI complications. She is at high risk for bleeding, renal failure, infection or other wound problems and neurologic complications  She  understands the risks and wishes to talk things over with her family.  She needs a repeat echo as the most recent was in January. She also need carotid duplex.  Her P2Y12 was 233 but given her age and steroid use I think  she needs to be off plavix a minimum of 4 days prior to surgery. I would prefer 5-7 days but need to weigh the risk given she has a DES in the LAD.  Melrose Nakayama 08/18/2014, 1:32 PM

## 2014-08-22 NOTE — OR Nursing (Signed)
Forty minute call to SICU charge nurse at 1219.

## 2014-08-22 NOTE — OR Nursing (Signed)
Twenty minute call to SICU charge nurse at 1243.

## 2014-08-22 NOTE — Anesthesia Postprocedure Evaluation (Signed)
Anesthesia Post Note  Patient: Sharon Hubbard  Procedure(s) Performed: Procedure(s) (LRB): CORONARY ARTERY BYPASS GRAFTING (CABG), ON PUMP, TIMES ONE, USING RIGHT INTERNAL MAMMARY ARTERY (N/A) AORTIC VALVE REPLACEMENT (AVR) (N/A) TRANSESOPHAGEAL ECHOCARDIOGRAM (TEE) (N/A)  Anesthesia type: General  Patient location: ICU  Post pain: Pain level controlled  Post assessment: Post-op Vital signs reviewed  Last Vitals:  Filed Vitals:   08/22/14 1645  BP:   Pulse: 91  Temp: 36.2 C  Resp: 12    Post vital signs: stable  Level of consciousness: Patient remains intubated per anesthesia plan  Complications: No apparent anesthesia complications

## 2014-08-22 NOTE — Brief Op Note (Addendum)
08/15/2014 - 08/22/2014  11:46 AM  PATIENT:  Sharon Hubbard  81 y.o. female  PRE-OPERATIVE DIAGNOSIS:  Single vessel CAD, moderately severe AS, unstable angina  POST-OPERATIVE DIAGNOSIS: Single vessel CAD, moderately severe AS, unstable angina  PROCEDURE:   MEDIAN STERNOTOMY, EXTRACORPOREAL CIRCULATION CORONARY ARTERY BYPASS GRAFTING x 1 (Free RIMA-RCA) AORTIC VALVE REPLACEMENT (21 mm Edwards Magna Ease pericardial tissue valve)  SURGEON:  Melrose Nakayama, MD  ASSISTANT: Suzzanne Cloud, PA-C  ANESTHESIA:   general  PATIENT CONDITION:  ICU - intubated and hemodynamically stable.  PRE-OPERATIVE WEIGHT: 59 kg   Aortic Valve Etiology   Aortic Insufficiency:  Trivial/Trace  Aortic Valve Disease:  Yes.  Aortic Stenosis:  Yes. Smallest Aortic Valve Area: 0.89cm2; Highest Mean Gradient: 39mmHg.  Etiology (Choose at least one and up to  5 etiologies):  Degenerative - Calcified  Aortic Valve  Procedure Performed:  Replacement: Yes.  Bioprosthetic Valve. Implant Model Number:[id number], Size:21, Unique Device Identifier:[id number]  Repair/Reconstruction: No.   Aortic Annular Enlargement: No.   FINDINGS: TEE: LVH, preserved wall motion, moderately severe AS AoV area 0.89 cm2 RCA diffusely calcified, good quality at anastomosis, RIMA good quality  XC= 94 min CPB= 143 min

## 2014-08-22 NOTE — Procedures (Signed)
Extubation Procedure Note  Patient Details:   Name: NADENE WITHERSPOON DOB: [birth year] MRN: [id number]   Airway Documentation:     Evaluation  O2 sats: stable throughout Complications: No apparent complications Patient did tolerate procedure well. Bilateral Breath Sounds: Clear   Yes  4l/min [place] NIF-20 FVC-778ml Incentive spirometer instructed.  Revonda Standard 08/22/2014, 6:56 PM

## 2014-08-23 ENCOUNTER — Encounter (HOSPITAL_COMMUNITY): Payer: Self-pay | Admitting: Thoracic Surgery (Cardiothoracic Vascular Surgery)

## 2014-08-23 ENCOUNTER — Inpatient Hospital Stay (HOSPITAL_COMMUNITY): Payer: Medicare Other

## 2014-08-23 LAB — CREATININE, SERUM
Creatinine, Ser: 1.29 mg/dL — ABNORMAL HIGH (ref 0.44–1.00)
GFR calc Af Amer: 44 mL/min — ABNORMAL LOW (ref >60)
GFR calc non Af Amer: 38 mL/min — ABNORMAL LOW (ref >60)

## 2014-08-23 LAB — GLUCOSE, CAPILLARY
GLUCOSE-CAPILLARY: 122 mg/dL — AB (ref 65–99)
GLUCOSE-CAPILLARY: 164 mg/dL — AB (ref 65–99)
GLUCOSE-CAPILLARY: 118 mg/dL — AB (ref 65–99)
GLUCOSE-CAPILLARY: 113 mg/dL — AB (ref 65–99)
Glucose-Capillary: 120 mg/dL — ABNORMAL HIGH (ref 65–99)
Glucose-Capillary: 124 mg/dL — ABNORMAL HIGH (ref 65–99)
Glucose-Capillary: 129 mg/dL — ABNORMAL HIGH (ref 65–99)
Glucose-Capillary: 131 mg/dL — ABNORMAL HIGH (ref 65–99)
Glucose-Capillary: 126 mg/dL — ABNORMAL HIGH (ref 65–99)
Glucose-Capillary: 148 mg/dL — ABNORMAL HIGH (ref 65–99)
Glucose-Capillary: 157 mg/dL — ABNORMAL HIGH (ref 65–99)
Glucose-Capillary: 165 mg/dL — ABNORMAL HIGH (ref 65–99)
Glucose-Capillary: 111 mg/dL — ABNORMAL HIGH (ref 65–99)
Glucose-Capillary: 137 mg/dL — ABNORMAL HIGH (ref 65–99)
Glucose-Capillary: 150 mg/dL — ABNORMAL HIGH (ref 65–99)
Glucose-Capillary: 158 mg/dL — ABNORMAL HIGH (ref 65–99)
Glucose-Capillary: 164 mg/dL — ABNORMAL HIGH (ref 65–99)
Glucose-Capillary: 255 mg/dL — ABNORMAL HIGH (ref 65–99)
Glucose-Capillary: 249 mg/dL — ABNORMAL HIGH (ref 65–99)

## 2014-08-23 LAB — MAGNESIUM
MAGNESIUM: 2.2 mg/dL (ref 1.7–2.4)
Magnesium: 1.8 mg/dL (ref 1.7–2.4)

## 2014-08-23 LAB — BASIC METABOLIC PANEL
Anion gap: 10 (ref 5–15)
BUN: 9 mg/dL (ref 6–20)
CO2: 20 mmol/L — ABNORMAL LOW (ref 22–32)
Calcium: 8.2 mg/dL — ABNORMAL LOW (ref 8.9–10.3)
Chloride: 103 mmol/L (ref 101–111)
Creatinine, Ser: 1.06 mg/dL — ABNORMAL HIGH (ref 0.44–1.00)
GFR, EST AFRICAN AMERICAN: 56 mL/min — AB (ref >60)
GFR, EST NON AFRICAN AMERICAN: 49 mL/min — AB (ref >60)
GLUCOSE: 163 mg/dL — AB (ref 65–99)
Potassium: 4.3 mmol/L (ref 3.5–5.1)
SODIUM: 133 mmol/L — AB (ref 135–145)

## 2014-08-23 LAB — POCT I-STAT, CHEM 8
BUN: 8 mg/dL (ref 6–20)
CALCIUM ION: 1.23 mmol/L (ref 1.13–1.30)
CREATININE: 1.1 mg/dL — AB (ref 0.44–1.00)
Chloride: 98 mmol/L — ABNORMAL LOW (ref 101–111)
GLUCOSE: 300 mg/dL — AB (ref 65–99)
HCT: 27 % — ABNORMAL LOW (ref 36.0–46.0)
HEMOGLOBIN: 9.2 g/dL — AB (ref 12.0–15.0)
POTASSIUM: 4.1 mmol/L (ref 3.5–5.1)
Sodium: 133 mmol/L — ABNORMAL LOW (ref 135–145)
TCO2: 20 mmol/L (ref 0–100)

## 2014-08-23 LAB — CBC
HCT: 28.5 % — ABNORMAL LOW (ref 36.0–46.0)
HEMATOCRIT: 27.8 % — AB (ref 36.0–46.0)
HEMOGLOBIN: 9.9 g/dL — AB (ref 12.0–15.0)
Hemoglobin: 9.7 g/dL — ABNORMAL LOW (ref 12.0–15.0)
MCH: 30.2 pg (ref 26.0–34.0)
MCH: 30.2 pg (ref 26.0–34.0)
MCHC: 34.9 g/dL (ref 30.0–36.0)
MCHC: 34.7 g/dL (ref 30.0–36.0)
MCV: 86.6 fL (ref 78.0–100.0)
MCV: 86.9 fL (ref 78.0–100.0)
Platelets: 103 10*3/uL — ABNORMAL LOW (ref 150–400)
Platelets: 91 10*3/uL — ABNORMAL LOW (ref 150–400)
RBC: 3.21 MIL/uL — ABNORMAL LOW (ref 3.87–5.11)
RBC: 3.28 MIL/uL — ABNORMAL LOW (ref 3.87–5.11)
RDW: 15.3 % (ref 11.5–15.5)
RDW: 15.6 % — AB (ref 11.5–15.5)
WBC: 7.4 10*3/uL (ref 4.0–10.5)
WBC: 9 10*3/uL (ref 4.0–10.5)

## 2014-08-23 LAB — PREPARE PLATELET PHERESIS: Unit division: 0

## 2014-08-23 MED ORDER — TRAMADOL HCL 50 MG PO TABS
50.0000 mg | ORAL_TABLET | Freq: Four times a day (QID) | ORAL | Status: DC | PRN
  Administered 2014-08-23 – 2014-08-25 (×3): 50 mg via ORAL
  Administered 2014-08-27: 100 mg via ORAL
  Filled 2014-08-23: qty 2
  Filled 2014-08-23 (×3): qty 1
  Filled 2014-08-23: qty 2

## 2014-08-23 MED ORDER — INSULIN ASPART 100 UNIT/ML ~~LOC~~ SOLN
3.0000 [IU] | Freq: Three times a day (TID) | SUBCUTANEOUS | Status: DC
  Administered 2014-08-23: 3 [IU] via SUBCUTANEOUS

## 2014-08-23 MED ORDER — INSULIN DETEMIR 100 UNIT/ML ~~LOC~~ SOLN
25.0000 [IU] | Freq: Once | SUBCUTANEOUS | Status: AC
  Administered 2014-08-23: 25 [IU] via SUBCUTANEOUS
  Filled 2014-08-23: qty 0.25

## 2014-08-23 MED ORDER — INSULIN ASPART 100 UNIT/ML ~~LOC~~ SOLN
0.0000 [IU] | SUBCUTANEOUS | Status: DC
  Administered 2014-08-23: 12 [IU] via SUBCUTANEOUS

## 2014-08-23 MED ORDER — OXYCODONE HCL 5 MG PO TABS
5.0000 mg | ORAL_TABLET | ORAL | Status: DC | PRN
  Administered 2014-08-23 – 2014-08-28 (×20): 5 mg via ORAL
  Filled 2014-08-23 (×21): qty 1

## 2014-08-23 MED ORDER — SODIUM CHLORIDE 0.9 % IV SOLN
INTRAVENOUS | Status: DC
  Administered 2014-08-23: 3.6 [IU]/h via INTRAVENOUS
  Filled 2014-08-23 (×2): qty 2.5

## 2014-08-23 MED ORDER — AMLODIPINE BESYLATE 10 MG PO TABS
10.0000 mg | ORAL_TABLET | Freq: Every day | ORAL | Status: DC
  Administered 2014-08-23 – 2014-08-29 (×7): 10 mg via ORAL
  Filled 2014-08-23 (×8): qty 1

## 2014-08-23 MED ORDER — GLIMEPIRIDE 4 MG PO TABS
4.0000 mg | ORAL_TABLET | Freq: Two times a day (BID) | ORAL | Status: DC
  Administered 2014-08-23: 4 mg via ORAL
  Filled 2014-08-23 (×2): qty 1

## 2014-08-23 MED ORDER — ENOXAPARIN SODIUM 40 MG/0.4ML ~~LOC~~ SOLN
40.0000 mg | Freq: Every day | SUBCUTANEOUS | Status: DC
  Administered 2014-08-23: 40 mg via SUBCUTANEOUS
  Filled 2014-08-23 (×2): qty 0.4

## 2014-08-23 MED ORDER — GLIMEPIRIDE 4 MG PO TABS
4.0000 mg | ORAL_TABLET | Freq: Two times a day (BID) | ORAL | Status: DC
  Administered 2014-08-23 – 2014-08-29 (×12): 4 mg via ORAL
  Filled 2014-08-23 (×14): qty 1

## 2014-08-23 MED ORDER — HYDROCORTISONE NA SUCCINATE PF 100 MG IJ SOLR
50.0000 mg | Freq: Three times a day (TID) | INTRAMUSCULAR | Status: AC
  Administered 2014-08-23 – 2014-08-24 (×3): 50 mg via INTRAVENOUS
  Filled 2014-08-23 (×3): qty 1

## 2014-08-23 MED ORDER — LISINOPRIL 10 MG PO TABS
10.0000 mg | ORAL_TABLET | Freq: Every day | ORAL | Status: DC
  Administered 2014-08-23 – 2014-08-25 (×3): 10 mg via ORAL
  Filled 2014-08-23 (×4): qty 1

## 2014-08-23 MED ORDER — CLOPIDOGREL BISULFATE 75 MG PO TABS
75.0000 mg | ORAL_TABLET | Freq: Every day | ORAL | Status: DC
  Administered 2014-08-23 – 2014-08-29 (×7): 75 mg via ORAL
  Filled 2014-08-23 (×8): qty 1

## 2014-08-23 MED ORDER — FUROSEMIDE 10 MG/ML IJ SOLN
40.0000 mg | Freq: Once | INTRAMUSCULAR | Status: AC
  Administered 2014-08-23: 40 mg via INTRAVENOUS
  Filled 2014-08-23: qty 4

## 2014-08-23 MED ORDER — INSULIN DETEMIR 100 UNIT/ML ~~LOC~~ SOLN: 25.0000 [IU] | Freq: Every day | SUBCUTANEOUS | Status: DC

## 2014-08-23 MED ORDER — PREDNISONE 1 MG PO TABS
4.0000 mg | ORAL_TABLET | Freq: Every day | ORAL | Status: DC
  Administered 2014-08-24 – 2014-08-29 (×6): 4 mg via ORAL
  Filled 2014-08-23 (×7): qty 4

## 2014-08-23 MED FILL — Heparin Sodium (Porcine) Inj 1000 Unit/ML: INTRAMUSCULAR | Qty: 30 | Status: AC

## 2014-08-23 MED FILL — Potassium Chloride Inj 2 mEq/ML: INTRAVENOUS | Qty: 40 | Status: AC

## 2014-08-23 MED FILL — Magnesium Sulfate Inj 50%: INTRAMUSCULAR | Qty: 10 | Status: AC

## 2014-08-23 NOTE — Op Note (Signed)
NAMETAHLIA, DEAMER NO.:  [id number]  MEDICAL RECORD NO.:  [id number]  LOCATION:  2S08C                        FACILITY:  [facility]  PHYSICIAN:  Revonda Standard. Roxan Hockey, M.D.DATE OF BIRTH:  [birth year]  DATE OF PROCEDURE:  08/22/2014 DATE OF DISCHARGE:                              OPERATIVE REPORT   PREOPERATIVE DIAGNOSIS:  Severe single-vessel disease with moderately severe aortic stenosis and unstable coronary syndrome.  POSTOPERATIVE DIAGNOSIS:  Severe single-vessel disease with moderately severe aortic stenosis and unstable coronary syndrome.  PROCEDURE:   Median sternotomy, extracorporeal circulation, Coronary artery bypass grafting x1  Free right internal mammary artery to right coronary Aortic valve replacement with 21-mm [facility] Ease pericardial tissue valve  (Model # 3300TFX, serial # [id number]).  SURGEON:  Revonda Standard. Roxan Hockey, M.D.  ASSISTANT:  Suzzanne Cloud, P.A.  ANESTHESIA:  General.  FINDINGS:  Transesophageal echocardiography revealed left ventricular hypertrophy with preserved wall motion.  Moderately severe aortic stenosis with aortic valve area of 0.89 sq cm, peak gradient of 30. Right coronary was diffusely calcified but good quality at the site of the anastomosis. The right internal mammary artery was good quality. Postbypass transesophageal echocardiography revealed preserved left ventricular wall motion with good function of the prosthetic valve with no perivalvular leak.  CLINICAL NOTE:  Ms. Paulo is a 81 year old woman with known coronary artery disease and moderate aortic stenosis.  She presented with unstable chest pain, but ruled out for myocardial infarction,  On cardiac catheterization, she was found to have severe single-vessel disease with a tight stenosis in the right coronary which was subtotally occluded.  Her stents in the circumflex and LAD were patent, and there were no flow-limiting lesions on the left side  except a separate small diagonal branch of the LAD.  Two attempts on separate days were made to pass a wire across the right coronary stenosis for angioplasty, but both were unsuccessful.  She was referred for single-vessel bypass grafting. Because of her known moderate aortic stenosis with a valve area of 1.2 cm2 in January of 2012, a repeat echocardiogram was done. It showed progression of disease with a valve area estimated at 0.9 sq cm. Because of the significant progression over a 35-month period of time, she was advised to have aortic valve replacement at the time of bypass grafting.  The indications, risks, benefits, and alternatives were discussed in detail with the patient.  She understood and accepted the risks and agreed to proceed.  OPERATIVE NOTE:  Ms. Sou was brought to the preoperative holding area on August 22, 2014.  Anesthesia placed a Swan-Ganz catheter and an arterial blood pressure monitoring line.  She was taken to the operating Room. On arrival to the operating room, she began having chest pain. She had induction of general anesthesia, and her ST changes resolved. Intravenous antibiotics were administered.  Transesophageal echocardiography was performed.  See findings as noted above.  A Foley catheter was placed.  The chest, abdomen, and legs were prepped and draped in the usual sterile fashion.  A median sternotomy was performed. Initial hemostasis was achieved.  The right internal mammary artery was harvested under direct vision down to the bifurcation. 5000 units of heparin was administered during the  mammary harvest. There was good flow through the distal end of the vessel.  The remainder of the full heparin dose was given.  A sternal retractor was placed. The pericardium was opened.  The ascending aorta was inspected.  There was some calcified plaque proximally, but none in the vicinity of cannulation site or site for the cross-clamp.  After confirming  adequate anticoagulation and ACT measurement, the aorta was cannulated via concentric 2-0 Ethibond pledgeted pursestring sutures.  A dual-stage venous cannula was placed via a pursestring suture in the right atrial appendage.  Cardiopulmonary bypass was initiated and flows were maintained per protocol. Carbon dioxide was insufflated into the operative field. The patient was cooled to 32 degrees Celsius. The right coronary was inspected, it was heavily calcified until it crossed the acute marginal.  More distally, there was an area that was clear of calcification that was selected for the anastomotic sites.  The right mammary would not reach as a pedicle graft, therefore it was divided proximally and the proximal stump was suture ligated.  There was sufficient length as a free graft.  A retrograde cardioplegia cannula was placed via a pursestring suture in the right atrium and directed into the coronary sinus.  A left ventricular vent was placed via pursestring suture in the right superior pulmonary vein.  An antegrade cardioplegia cannula was placed in the ascending aorta.  The aorta was crossclamped.  The left ventricle was emptied via the Vents. Cardiac arrest then was achieved with a combination of cold antegrade and retrograde blood cardioplegia and topical iced saline.  An initial 500 mL of cardioplegia was given antegrade.  There was rapid septal cooling and a rapid diastolic arrest.  An additional 500 mL of cardioplegia was given retrograde.  There was septal cooling to 9 degrees Celsius.  The distal right coronary was exposed and arteriotomy was made.  This vessel was 2 mm in diameter and gave rise to an acute marginal and posterior descending. A 1.5 mm probe passed easily into both of these branches.  The distal end of the right mammary was beveled.  It was anastomosed end-to-side with a running 8-0 Prolene suture.  At the completion of the anastomosis, a probe was passed through  the right mammary artery and it passed easily distally.  Additional cardioplegia was administered via the aortic root and there was good backbleeding from the right mammary.  An aortotomy was performed.  There was some calcification particularly on the right lateral aspect of the ascending aorta and posteriorly. The aortic valve was inspected, it was tricuspid, calcific and stenotic. There was moderate calcification of the valve leaflets and moderate annular calcification.  The valve leaflets were excised.  The annulus was debrided.  Care was taken to contain all calcific debris.  The annulus was copiously irrigated with iced saline.  The annulus sized for a 21-mm [employer] Ease pericardial tissue valve.  The valve was prepared per manufacturer's specifications.  2-0 Ethibond horizontal mattress sutures with sub-annular pledgets were placed circumferentially in the annulus.  14 sutures were used in all.  Additional cardioplegia was administered at 15-20 minutes intervals via the retrograde cannula during the valve replacement portion of the procedure.  The sutures then were passed through the sewing ring of the valve.  The valve was lowered and seated well on the annulus, the sutures were sequentially tied.  The annulus was probed with a fine tip right angle and no gaps were noted. The coronary ostia were not impinged. Rewarming  was begun.  The aortotomy was closed in 2 layers with a running 4-0 Prolene suture.  The first was a running horizontal mattress suture followed by a running simple suture.  Teflon felt strips were used to buttress the aorta for the closure.  A 4.0-mm punch aortotomy was made in the ascending aorta. Initial de-airing of the left ventricle was performed. The proximal end of the right mammary was beveled and anastomosed end-to-side to the aortotomy with a running 7-0 Prolene suture.  At the completion of this anastomosis, the patient was placed in Trendelenburg  position and a warm dose of retrograde cardioplegia was administered.  Additional de-airing was performed via the root vent.  Lidocaine was administered.  The aortic cross-clamp was removed.  Total cross-clamp time was 94 minutes.  The patient was in heart block after removal of the cross-clamp.  The aortotomy and the proximal anastomosis were inspected for hemostasis, as was the distal anastomosis.  The retrograde cannula and the left ventricular vent were removed. Epicardial pacing wires were placed on the right ventricle and right atrium.  DDD pacing was initiated at 90 beats per minute.  A low-dose dopamine infusion was initiated at 3 mcg/kg/minute. When the patient had rewarmed to a core temperature of 37 degrees Celsius, she was weaned from cardiopulmonary bypass on the first attempt.  She was DDD paced and on dopamine at 3 mcg/kg/minute at the time of separation from bypass. The initial cardiac index was greater than 2 L/minute/sq m, and the patient remained hemodynamically stable throughout the postbypass period.  Postbypass transesophageal echocardiography showed no residual air.  There was good function of the prosthetic valve with no perivalvular leaks.  The aortic root vent was removed, and the suture was tied.  There was good hemostasis.  A test dose of protamine was administered and well tolerated.  The atrial and aortic cannulae were removed.  The remainder of the protamine was administered without incident.  The chest was irrigated with warm saline.  Hemostasis was achieved.  The pericardium was reapproximated over the ascending aorta with interrupted 3-0 silk sutures.  Right pleural and mediastinal chest tubes were placed through separate subcostal incisions.  The sternum was closed with a combination of single and double heavy gauge stainless steel wires.  The pectoralis fascia, subcutaneous tissue, and skin were closed in standard fashion.  All sponge, needle,  and instrument counts were correct at the end of the procedure.  The patient was taken from the operating room to the Surgical Intensive Care Unit intubated and in good condition.     Revonda Standard Roxan Hockey, M.D.     SCH/MEDQ  D:  08/22/2014  T:  08/23/2014  Job:  [id number]

## 2014-08-23 NOTE — Progress Notes (Signed)
CBG 255; patient received levemir at noon and amaryl PO. Patient given 12 units of subq insulin, MD in emergency surgery, will monitor CBG and notify MD on rounds.  Rowe Pavy, RN

## 2014-08-23 NOTE — Care Management Important Message (Signed)
Important Message  Patient Details  Name: Sharon Hubbard MRN: [id number] Date of Birth: 11-Mar-1933   Medicare Important Message Given:  Yes-third notification given    Nathen May 08/23/2014, 1:05 [place] Message  Patient Details  Name: Sharon Hubbard MRN: [id number] Date of Birth: August 26, 1933   Medicare Important Message Given:  Yes-third notification given    Nathen May 08/23/2014, 1:05 PM

## 2014-08-23 NOTE — Progress Notes (Signed)
CT surgery p.m. Rounds  Patient sitting up in bed in good spirits Maintaining stable hemodynamics in sinus rhythm Evening blood sugars consistently > 250 despite   25 units of Levemir earlier today--will resume insulin drip Otherwise evening labs reviewed and are satisfactory

## 2014-08-23 NOTE — Progress Notes (Signed)
SUBJECTIVE: No complaints other than sore chest.   BP 139/66 mmHg  Pulse 89  Temp(Src) 98.4 F (36.9 C) (Oral)  Resp 19  Ht 5\' 3"  (1.6 m)  Wt 141 lb 1.5 oz (64 kg)  BMI 25.00 kg/m2  SpO2 99%  Intake/Output Summary (Last 24 hours) at 08/23/14 1215 Last data filed at 08/23/14 1100  Gross per 24 hour  Intake 5560.72 ml  Output   4540 ml  Net 1020.72 ml    PHYSICAL EXAM General: Well developed, well nourished, in no acute distress. Alert and oriented x 3.  Psych:  Good affect, responds appropriately Neck: No JVD. No masses noted.  Lungs: Clear bilaterally with no wheezes or rhonci noted.  Heart: RRR with no murmurs noted. Abdomen: Bowel sounds are present. Soft, non-tender.  Extremities: No lower extremity edema.   LABS: Basic Metabolic Panel:  Recent Labs  08/22/14 0012  08/22/14 1940 08/22/14 2005 08/23/14 0511  NA 136  < >  --  137 133*  K 4.2  < >  --  4.5 4.3  CL 104  < >  --  105 103  CO2 25  --   --   --  20*  GLUCOSE 243*  < >  --  147* 163*  BUN 15  < >  --  10 9  CREATININE 1.36*  < > 0.95 0.90 1.06*  CALCIUM 9.0  --   --   --  8.2*  MG  --   --  2.8*  --  2.2  < > = values in this interval not displayed. CBC:  Recent Labs  08/22/14 1940 08/22/14 2005 08/23/14 0511  WBC 5.6  --  7.4  HGB 9.9* 9.9* 9.7*  HCT 28.4* 29.0* 27.8*  MCV 86.9  --  86.6  PLT 116*  --  103*   Current Meds: . acetaminophen  1,000 mg Oral 4 times per day   Or  . acetaminophen (TYLENOL) oral liquid 160 mg/5 mL  1,000 mg Per Tube 4 times per day  . amLODipine  10 mg Oral Daily  . aspirin EC  325 mg Oral Daily   Or  . aspirin  324 mg Per Tube Daily  . bisacodyl  10 mg Oral Daily   Or  . bisacodyl  10 mg Rectal Daily  . cefUROXime (ZINACEF)  IV  1.5 g Intravenous Q12H  . clopidogrel  75 mg Oral Daily  . divalproex  500 mg Oral Daily  . docusate sodium  200 mg Oral Daily  . enoxaparin (LOVENOX) injection  40 mg Subcutaneous QHS  . glimepiride  4 mg Oral BID    . hydrocortisone sodium succinate  50 mg Intravenous Q8H  . insulin aspart  0-24 Units Subcutaneous 6 times per day  . insulin aspart  3 Units Subcutaneous TID WC  . [START ON 08/24/2014] insulin detemir  25 Units Subcutaneous Daily  . insulin regular  0-10 Units Intravenous TID WC  . latanoprost  1 drop Both Eyes QHS  . lisinopril  10 mg Oral Daily  . metoprolol tartrate  12.5 mg Oral BID   Or  . metoprolol tartrate  12.5 mg Per Tube BID  . mirabegron ER  25 mg Oral Daily  . pantoprazole  40 mg Oral Daily  . [START ON 08/24/2014] predniSONE  4 mg Oral Q breakfast  . rosuvastatin  20 mg Oral Daily  . sodium chloride  3 mL Intravenous Q12H  . timolol  1 drop Both Eyes Daily    ASSESSMENT AND PLAN:  1. CAD/Unstable angina: Attempted PCI of the mid RCA calcific lesion but could not cross. Now s/p single vessel CABG with free RIMA to RCA. Stable. On ASA and Plavix, statin, beta blocker.   2. Aortic stenosis: s/p AVR with bioprosthetic AVR.    Doing well on POD #1. Hemodynamically stable.   Kemiya Batdorf  8/2/201612:15 PM

## 2014-08-23 NOTE — Progress Notes (Signed)
1 Day Post-Op Procedure(s) (LRB): CORONARY ARTERY BYPASS GRAFTING (CABG), ON PUMP, TIMES ONE, USING RIGHT INTERNAL MAMMARY ARTERY (N/A) AORTIC VALVE REPLACEMENT (AVR) (N/A) TRANSESOPHAGEAL ECHOCARDIOGRAM (TEE) (N/A) Subjective: Pain well controlled Denies nausea  Objective: Vital signs in last 24 hours: Temp:  [95.9 F (35.5 C)-100 F (37.8 C)] 99.7 F (37.6 C) (08/02 0700) Pulse Rate:  [89-95] 89 (08/02 0700) Cardiac Rhythm:  [-] A-V Sequential paced (08/02 0500) Resp:  [9-28] 15 (08/02 0700) BP: (80-132)/(55-72) 117/58 mmHg (08/02 0700) SpO2:  [93 %-100 %] 96 % (08/02 0700) Arterial Line BP: (106-167)/(54-83) 140/54 mmHg (08/02 0700) FiO2 (%):  [40 %-50 %] 40 % (08/01 1751) Weight:  [141 lb 1.5 oz (64 kg)] 141 lb 1.5 oz (64 kg) (08/02 0500)  Hemodynamic parameters for last 24 hours: PAP: (17-32)/(10-22) 21/14 mmHg CO:  [2.1 L/min-5.1 L/min] 5 L/min CI:  [1.3 L/min/m2-3.5 L/min/m2] 3.1 L/min/m2  Intake/Output from previous day: 08/01 0701 - 08/02 0700 In: 5070.3 [I.V.:3316.3; Blood:674; NG/GT:30; IV Piggyback:1050] Out: 0034 [Urine:3300; Chest Tube:490] Intake/Output this shift:    General appearance: alert, cooperative and no distress Neurologic: intact Heart: regular rate and rhythm Lungs: diminished breath sounds bibasilar Abdomen: nontender, hypoactive BS  Lab Results:  Recent Labs  08/22/14 1940 08/22/14 2005 08/23/14 0511  WBC 5.6  --  7.4  HGB 9.9* 9.9* 9.7*  HCT 28.4* 29.0* 27.8*  PLT 116*  --  103*   BMET:  Recent Labs  08/22/14 0012  08/22/14 2005 08/23/14 0511  NA 136  < > 137 133*  K 4.2  < > 4.5 4.3  CL 104  < > 105 103  CO2 25  --   --  20*  GLUCOSE 243*  < > 147* 163*  BUN 15  < > 10 9  CREATININE 1.36*  < > 0.90 1.06*  CALCIUM 9.0  --   --  8.2*  < > = values in this interval not displayed.  PT/INR:  Recent Labs  08/22/14 1345  LABPROT 17.3*  INR 1.40   ABG    Component Value Date/Time   PHART 7.373 08/22/2014 2000   HCO3  21.2 08/22/2014 2000   TCO2 21 08/22/2014 2005   ACIDBASEDEF 4.0* 08/22/2014 2000   O2SAT 92.0 08/22/2014 2000   CBG (last 3)   Recent Labs  08/23/14 0404 08/23/14 0502 08/23/14 0609  GLUCAP 148* 157* 165*    Assessment/Plan: S/P Procedure(s) (LRB): CORONARY ARTERY BYPASS GRAFTING (CABG), ON PUMP, TIMES ONE, USING RIGHT INTERNAL MAMMARY ARTERY (N/A) AORTIC VALVE REPLACEMENT (AVR) (N/A) TRANSESOPHAGEAL ECHOCARDIOGRAM (TEE) (N/A) -  CV- good index, wean dopamine, dc swan  Hypertension- restart lisinopril, norvasc  Restart plavix  RESP- IS  RENAL- creatinine Ok, diurese for volume overload  ENDO- CBG elevated, transition to levemir + SSI, restart amaryl  DVT prophylaxis- SCD, add enoxaparin  DC CT  OOB, ambulate   LOS: 8 days    Sharon Hubbard 08/23/2014

## 2014-08-23 NOTE — Care Management Note (Signed)
Case Management Note  Patient Details  Name: VANNARY GREENING MRN: [id number] Date of Birth: [birth year]  Subjective/Objective:         Patient lives at home with husband.  Husband has had strokes and she states is unable to care for her on discharge.  Has children but none she states are able to care for her on discharge.  She plan to go to SNF - for rehab.  States she has done this in the past.             Action/Plan:   Expected Discharge Date:                  Expected Discharge Plan:  [place]  In-House Referral:     Discharge planning Services     Post Acute Care Choice:    Choice offered to:     DME Arranged:    DME Agency:     HH Arranged:    [place] Agency:     Status of Service:  In process, will continue to follow  Medicare Important Message Given:  Yes-third notification given Date Medicare IM Given:    Medicare IM give by:    Date Additional Medicare IM Given:    Additional Medicare Important Message give by:     If discussed at [place] of Stay Meetings, dates discussed:    Additional Comments:  Vergie Living, RN 08/23/2014, 3:18 PM

## 2014-08-24 ENCOUNTER — Inpatient Hospital Stay (HOSPITAL_COMMUNITY): Payer: Medicare Other

## 2014-08-24 LAB — GLUCOSE, CAPILLARY
GLUCOSE-CAPILLARY: 77 mg/dL (ref 65–99)
GLUCOSE-CAPILLARY: 60 mg/dL — AB (ref 65–99)
GLUCOSE-CAPILLARY: 72 mg/dL (ref 65–99)
GLUCOSE-CAPILLARY: 74 mg/dL (ref 65–99)
GLUCOSE-CAPILLARY: 101 mg/dL — AB (ref 65–99)
GLUCOSE-CAPILLARY: 95 mg/dL (ref 65–99)
GLUCOSE-CAPILLARY: 255 mg/dL — AB (ref 65–99)
GLUCOSE-CAPILLARY: 146 mg/dL — AB (ref 65–99)
Glucose-Capillary: 115 mg/dL — ABNORMAL HIGH (ref 65–99)
Glucose-Capillary: 151 mg/dL — ABNORMAL HIGH (ref 65–99)
Glucose-Capillary: 187 mg/dL — ABNORMAL HIGH (ref 65–99)
Glucose-Capillary: 203 mg/dL — ABNORMAL HIGH (ref 65–99)
Glucose-Capillary: 240 mg/dL — ABNORMAL HIGH (ref 65–99)
Glucose-Capillary: 82 mg/dL (ref 65–99)
Glucose-Capillary: 88 mg/dL (ref 65–99)
Glucose-Capillary: 96 mg/dL (ref 65–99)

## 2014-08-24 LAB — BASIC METABOLIC PANEL
Anion gap: 7 (ref 5–15)
BUN: 11 mg/dL (ref 6–20)
CO2: 26 mmol/L (ref 22–32)
CREATININE: 1.07 mg/dL — AB (ref 0.44–1.00)
Calcium: 8.7 mg/dL — ABNORMAL LOW (ref 8.9–10.3)
Chloride: 104 mmol/L (ref 101–111)
GFR calc non Af Amer: 48 mL/min — ABNORMAL LOW (ref >60)
GFR, EST AFRICAN AMERICAN: 56 mL/min — AB (ref >60)
Glucose, Bld: 77 mg/dL (ref 65–99)
Potassium: 3.7 mmol/L (ref 3.5–5.1)
Sodium: 137 mmol/L (ref 135–145)

## 2014-08-24 LAB — CBC
HCT: 26.5 % — ABNORMAL LOW (ref 36.0–46.0)
Hemoglobin: 9.1 g/dL — ABNORMAL LOW (ref 12.0–15.0)
MCH: 30.1 pg (ref 26.0–34.0)
MCHC: 34.3 g/dL (ref 30.0–36.0)
MCV: 87.7 fL (ref 78.0–100.0)
Platelets: 89 10*3/uL — ABNORMAL LOW (ref 150–400)
RBC: 3.02 MIL/uL — ABNORMAL LOW (ref 3.87–5.11)
RDW: 15.6 % — AB (ref 11.5–15.5)
WBC: 8.8 10*3/uL (ref 4.0–10.5)

## 2014-08-24 LAB — MRSA PCR SCREENING: MRSA by PCR: NEGATIVE

## 2014-08-24 LAB — BLOOD PRODUCT ORDER (VERBAL) VERIFICATION

## 2014-08-24 MED ORDER — ALUM & MAG HYDROXIDE-SIMETH 200-200-20 MG/5ML PO SUSP: 15.0000 mL | ORAL | Status: DC | PRN

## 2014-08-24 MED ORDER — ZOLPIDEM TARTRATE 5 MG PO TABS
5.0000 mg | ORAL_TABLET | Freq: Every evening | ORAL | Status: DC | PRN
  Administered 2014-08-26 – 2014-08-27 (×2): 5 mg via ORAL
  Filled 2014-08-24 (×3): qty 1

## 2014-08-24 MED ORDER — MOVING RIGHT ALONG BOOK
Freq: Once | Status: AC
Start: 2014-08-24 — End: 2014-08-24
  Administered 2014-08-24: 18:00:00
  Filled 2014-08-24: qty 1

## 2014-08-24 MED ORDER — LORATADINE 10 MG PO TABS
10.0000 mg | ORAL_TABLET | Freq: Every day | ORAL | Status: DC | PRN
  Filled 2014-08-24: qty 1

## 2014-08-24 MED ORDER — SODIUM CHLORIDE 0.9 % IV SOLN: 250.0000 mL | INTRAVENOUS | Status: DC | PRN

## 2014-08-24 MED ORDER — SODIUM CHLORIDE 0.9 % IJ SOLN: 3.0000 mL | INTRAMUSCULAR | Status: DC | PRN

## 2014-08-24 MED ORDER — POTASSIUM CHLORIDE 10 MEQ/50ML IV SOLN
10.0000 meq | INTRAVENOUS | Status: AC
  Administered 2014-08-24 (×3): 10 meq via INTRAVENOUS
  Filled 2014-08-24 (×3): qty 50

## 2014-08-24 MED ORDER — SODIUM CHLORIDE 0.9 % IJ SOLN
3.0000 mL | Freq: Two times a day (BID) | INTRAMUSCULAR | Status: DC
  Administered 2014-08-24 – 2014-08-29 (×8): 3 mL via INTRAVENOUS

## 2014-08-24 MED ORDER — POTASSIUM CHLORIDE CRYS ER 20 MEQ PO TBCR
20.0000 meq | EXTENDED_RELEASE_TABLET | Freq: Two times a day (BID) | ORAL | Status: DC
  Administered 2014-08-24 – 2014-08-25 (×4): 20 meq via ORAL
  Filled 2014-08-24 (×6): qty 1

## 2014-08-24 MED ORDER — CETYLPYRIDINIUM CHLORIDE 0.05 % MT LIQD
7.0000 mL | Freq: Two times a day (BID) | OROMUCOSAL | Status: DC
  Administered 2014-08-24: 7 mL via OROMUCOSAL

## 2014-08-24 MED ORDER — MAGNESIUM HYDROXIDE 400 MG/5ML PO SUSP: 30.0000 mL | Freq: Every day | ORAL | Status: DC | PRN

## 2014-08-24 MED ORDER — FUROSEMIDE 40 MG PO TABS
40.0000 mg | ORAL_TABLET | Freq: Two times a day (BID) | ORAL | Status: DC
  Administered 2014-08-24 – 2014-08-26 (×5): 40 mg via ORAL
  Filled 2014-08-24 (×7): qty 1

## 2014-08-24 MED ORDER — INSULIN ASPART 100 UNIT/ML ~~LOC~~ SOLN
3.0000 [IU] | Freq: Three times a day (TID) | SUBCUTANEOUS | Status: DC
  Administered 2014-08-26 – 2014-08-29 (×7): 3 [IU] via SUBCUTANEOUS

## 2014-08-24 MED ORDER — INSULIN ASPART 100 UNIT/ML ~~LOC~~ SOLN
0.0000 [IU] | Freq: Three times a day (TID) | SUBCUTANEOUS | Status: DC
  Administered 2014-08-24: 5 [IU] via SUBCUTANEOUS
  Administered 2014-08-24: 8 [IU] via SUBCUTANEOUS
  Administered 2014-08-25: 3 [IU] via SUBCUTANEOUS
  Administered 2014-08-25: 5 [IU] via SUBCUTANEOUS
  Administered 2014-08-26: 2 [IU] via SUBCUTANEOUS
  Administered 2014-08-26 (×2): 3 [IU] via SUBCUTANEOUS
  Administered 2014-08-27: 8 [IU] via SUBCUTANEOUS
  Administered 2014-08-27: 2 [IU] via SUBCUTANEOUS
  Administered 2014-08-28: 8 [IU] via SUBCUTANEOUS
  Administered 2014-08-28: 3 [IU] via SUBCUTANEOUS
  Administered 2014-08-28: 2 [IU] via SUBCUTANEOUS

## 2014-08-24 NOTE — Progress Notes (Signed)
CARDIAC REHAB PHASE I   PRE:  Rate/Rhythm: SR with paced beats 65  BP:  Supine: 129/69  Sitting:   Standing:    SaO2: 93%RA  MODE:  Ambulation: 150 ft   POST:  Rate/Rhythm: 70 SR  BP:  Supine:   Sitting: 141/62  Standing:    SaO2: 95%RA 1355-1415 Pt walked 150 ft on RA with gait belt use, rolling walker and asst x 2. Gait steady. Can be asst x 1. External pacer intact. To recliner with call bell. Encouraged IS.   Graylon Good, RN BSN  08/24/2014 2:11 PM

## 2014-08-24 NOTE — Evaluation (Signed)
Physical Therapy Evaluation Patient Details Name: Sharon Hubbard MRN: [id number] DOB: 1933/06/29 Today's Date: 08/24/2014   History of Present Illness  s/p CABG/AVR PMHx-polymyalgia, OA, multiple back surgeries, CAD/MI  Clinical Impression  Patient is s/p above surgery resulting in functional limitations due to the deficits listed below (see PT Problem List). 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 Patient will benefit from skilled PT to increase their independence and safety with mobility to allow discharge to the venue listed below.       Follow Up Recommendations SNF    Equipment Recommendations  None recommended by PT    Recommendations for Other Services       Precautions / Restrictions Precautions Precautions: Sternal Restrictions Weight Bearing Restrictions: Yes (Sternal precautions)      Mobility  Bed Mobility Overal bed mobility: Needs Assistance;+ 2 for safety/equipment Bed Mobility: Supine to Sit     Supine to sit: Mod assist;HOB elevated     General bed mobility comments: ICU air bed, HOB elevated; assist to raise torso and scoot out to edge of bed; vc to maintain sternal precautons  Transfers Overall transfer level: Needs assistance Equipment used: Rolling walker (2 wheeled);None Transfers: Sit to/from Stand Sit to Stand: Min assist         General transfer comment: twice; assist to power up to stand and steady (especially with no device)  Ambulation/Gait Ambulation/Gait assistance: Min assist;+2 safety/equipment Ambulation Distance (Feet): 200 Feet (with RW; 12 no device) Assistive device: Rolling walker (2 wheeled);None Gait Pattern/deviations: Step-through pattern;Decreased stride length;Trunk flexed Gait velocity: decr   General Gait Details: less steady walking in room w/c to recliner; required 2 standing rest breaks due to incr RR during 200 ft ambulation  Stairs             Wheelchair Mobility    Modified Rankin (Stroke Patients Only)       Balance Overall balance assessment: Needs assistance Sitting-balance support: No upper extremity supported;Feet supported Sitting balance-Leahy Scale: Fair     Standing balance support: No upper extremity supported Standing balance-Leahy Scale: Poor Standing balance comment: slightly unsteady                             Pertinent Vitals/Pain Pain Assessment: Faces Faces Pain Scale: Hurts little more Pain Location: chest Pain Intervention(s): Limited activity within patient's tolerance;Monitored during session;Repositioned    Home Living Family/patient expects to be discharged to:: Skilled nursing facility Living Arrangements: Spouse/significant other               Additional Comments: 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 multiple Lt CVAs with Rt hemiplegia    Prior Function Level of Independence: Independent               Hand Dominance        Extremity/Trunk Assessment   Upper Extremity Assessment: Overall WFL for tasks assessed           Lower Extremity Assessment: Overall WFL for tasks assessed      Cervical / Trunk Assessment: Normal  Communication   Communication: HOH  Cognition Arousal/Alertness: Awake/alert Behavior During Therapy: WFL for tasks assessed/performed Overall Cognitive Status: Within Functional Limits for tasks assessed                      General Comments      Exercises  Assessment/Plan    PT Assessment Patient needs continued PT services  PT Diagnosis Difficulty walking;Acute pain   PT Problem List Decreased activity tolerance;Decreased balance;Decreased mobility;Decreased knowledge of use of DME;Decreased knowledge of precautions;Cardiopulmonary status limiting activity;Pain  PT Treatment Interventions DME instruction;Gait training;Functional mobility training;Therapeutic activities;Therapeutic  exercise;Balance training;Patient/family education   PT Goals (Current goals can be found in the Care Plan section) Acute Rehab PT Goals Patient Stated Goal: regain independence and ability to care for her husband PT Goal Formulation: With patient Time For Goal Achievement: 09/07/14 Potential to Achieve Goals: Good    Frequency Min 3X/week   Barriers to discharge Decreased caregiver support      Co-evaluation               End of Session Equipment Utilized During Treatment: Gait belt Activity Tolerance: Patient tolerated treatment well Patient left: in chair;with nursing/sitter in room;with family/visitor present Nurse Communication: Mobility status         Time: 0174-9449 PT Time Calculation (min) (ACUTE ONLY): 19 min   Charges:   PT Evaluation $Initial PT Evaluation Tier I: 1 Procedure     PT G Codes:        Sharon Hubbard 09-10-14, 11:47 AM Pager [phone]

## 2014-08-24 NOTE — Progress Notes (Signed)
PT Cancellation Note  Patient Details Name: Sharon Hubbard MRN: [id number] DOB: [birth year]   Cancelled Treatment:    Reason Eval/Treat Not Completed: Patient at procedure or test/unavailable  Pt must remain supine another 30 minutes due to removal of central line.    Mareo Portilla 08/24/2014, 9:59 AM  Pager [phone]

## 2014-08-24 NOTE — Progress Notes (Signed)
Inpatient Diabetes Program Recommendations  AACE/ADA: New Consensus Statement on Inpatient Glycemic Control (2013)  Target Ranges:  Prepandial:   less than 140 mg/dL      Peak postprandial:   less than 180 mg/dL (1-2 hours)      Critically ill patients:  140 - 180 mg/dL   Review of Glycemic Control:  Results for Sharon Hubbard, Sharon Hubbard (MRN [id number]) as of 08/24/2014 14:51  Ref. Range 08/24/2014 08:16 08/24/2014 11:53  Glucose-Capillary Latest Ref Range: 65-99 mg/dL 95 255 (H)   Diabetes history: Type 2 diabetes Outpatient Diabetes medications: Amaryl 4 mg bid, Humalog Kwikpen 0-10 units tid with meals Current orders for Inpatient glycemic control:  Novolog moderate tid with meals, Novolog 3 units tid with meals, Amaryl 4 mg bid  Please consider adding Levemir 12 units daily.  Thanks, Adah Perl, RN, BC-ADM Inpatient Diabetes Coordinator Pager [phone] (8a-5p)

## 2014-08-24 NOTE — Progress Notes (Signed)
2 Days Post-Op Procedure(s) (LRB): CORONARY ARTERY BYPASS GRAFTING (CABG), ON PUMP, TIMES ONE, USING RIGHT INTERNAL MAMMARY ARTERY (N/A) AORTIC VALVE REPLACEMENT (AVR) (N/A) TRANSESOPHAGEAL ECHOCARDIOGRAM (TEE) (N/A) Subjective: No complaints this AM Walked around unit  Objective: Vital signs in last 24 hours: Temp:  [97.9 F (36.6 C)-99.7 F (37.6 C)] 97.9 F (36.6 C) (08/03 0416) Pulse Rate:  [59-94] 64 (08/03 0700) Cardiac Rhythm:  [-] Atrial paced (08/03 0400) Resp:  [11-31] 12 (08/03 0700) BP: (117-152)/(42-92) 146/54 mmHg (08/03 0600) SpO2:  [94 %-100 %] 97 % (08/03 0700) Arterial Line BP: (141-264)/(36-71) 158/70 mmHg (08/02 1400) Weight:  [139 lb 5.3 oz (63.2 kg)] 139 lb 5.3 oz (63.2 kg) (08/03 0500)  Hemodynamic parameters for last 24 hours: PAP: (18-23)/(8-16) 19/11 mmHg  Intake/Output from previous day: 08/02 0701 - 08/03 0700 In: 1226.7 [P.O.:480; I.V.:546.7; IV Piggyback:200] Out: 3165 [Urine:3165] Intake/Output this shift:    General appearance: alert, cooperative and no distress Neurologic: intact Heart: regular rate and rhythm Lungs: diminished breath sounds bibasilar Abdomen: normal findings: soft, non-tender  Lab Results:  Recent Labs  08/23/14 1658 08/24/14 0400  WBC 9.0 8.8  HGB 9.9* 9.1*  HCT 28.5* 26.5*  PLT 91* 89*   BMET:  Recent Labs  08/23/14 0511 08/23/14 1652 08/23/14 1658 08/24/14 0400  NA 133* 133*  --  137  K 4.3 4.1  --  3.7  CL 103 98*  --  104  CO2 20*  --   --  26  GLUCOSE 163* 300*  --  77  BUN 9 8  --  11  CREATININE 1.06* 1.10* 1.29* 1.07*  CALCIUM 8.2*  --   --  8.7*    PT/INR:  Recent Labs  08/22/14 1345  LABPROT 17.3*  INR 1.40   ABG    Component Value Date/Time   PHART 7.373 08/22/2014 2000   HCO3 21.2 08/22/2014 2000   TCO2 20 08/23/2014 1652   ACIDBASEDEF 4.0* 08/22/2014 2000   O2SAT 92.0 08/22/2014 2000   CBG (last 3)   Recent Labs  08/24/14 0459 08/24/14 0600 08/24/14 0656  GLUCAP 88  101* 96    Assessment/Plan: S/P Procedure(s) (LRB): CORONARY ARTERY BYPASS GRAFTING (CABG), ON PUMP, TIMES ONE, USING RIGHT INTERNAL MAMMARY ARTERY (N/A) AORTIC VALVE REPLACEMENT (AVR) (N/A) TRANSESOPHAGEAL ECHOCARDIOGRAM (TEE) (N/A) -  CV- stable, BP a little high- may be some effect from hydrocortisone  Continue current BP meds  ASA, plavix  RESP- IS, diuresis  RENAL- creatinine OK, weight still up -diurese, supplement K  Thrombocytopenia- PLT still low, will stop enoxaparin since she is ambulating well and on ASA, plavix  Anemia secondary to ABL- follow  ENDO- Hyperglycemia requiring insulin drip, likely exacerbated by hydrocortisone  Resume normal prednisone dose, continue amaryl + SSI  Transfer to [place]    LOS: 9 days    Melrose Nakayama 08/24/2014

## 2014-08-25 ENCOUNTER — Inpatient Hospital Stay (HOSPITAL_COMMUNITY): Payer: Medicare Other

## 2014-08-25 LAB — TYPE AND SCREEN
ABO/RH(D): A POS
Antibody Screen: NEGATIVE
UNIT DIVISION: 0
UNIT DIVISION: 0
UNIT DIVISION: 0
Unit division: 0
Unit division: 0
Unit division: 0

## 2014-08-25 LAB — BASIC METABOLIC PANEL
Anion gap: 8 (ref 5–15)
BUN: 18 mg/dL (ref 6–20)
CHLORIDE: 103 mmol/L (ref 101–111)
CO2: 26 mmol/L (ref 22–32)
Calcium: 8.7 mg/dL — ABNORMAL LOW (ref 8.9–10.3)
Creatinine, Ser: 1.25 mg/dL — ABNORMAL HIGH (ref 0.44–1.00)
GFR calc Af Amer: 46 mL/min — ABNORMAL LOW (ref >60)
GFR, EST NON AFRICAN AMERICAN: 40 mL/min — AB (ref >60)
GLUCOSE: 110 mg/dL — AB (ref 65–99)
Potassium: 4.1 mmol/L (ref 3.5–5.1)
Sodium: 137 mmol/L (ref 135–145)

## 2014-08-25 LAB — GLUCOSE, CAPILLARY
GLUCOSE-CAPILLARY: 105 mg/dL — AB (ref 65–99)
GLUCOSE-CAPILLARY: 175 mg/dL — AB (ref 65–99)
Glucose-Capillary: 243 mg/dL — ABNORMAL HIGH (ref 65–99)
Glucose-Capillary: 184 mg/dL — ABNORMAL HIGH (ref 65–99)

## 2014-08-25 LAB — CBC
HEMATOCRIT: 26.5 % — AB (ref 36.0–46.0)
Hemoglobin: 9 g/dL — ABNORMAL LOW (ref 12.0–15.0)
MCH: 30.3 pg (ref 26.0–34.0)
MCHC: 34 g/dL (ref 30.0–36.0)
MCV: 89.2 fL (ref 78.0–100.0)
Platelets: 101 10*3/uL — ABNORMAL LOW (ref 150–400)
RBC: 2.97 MIL/uL — AB (ref 3.87–5.11)
RDW: 16 % — ABNORMAL HIGH (ref 11.5–15.5)
WBC: 9.1 10*3/uL (ref 4.0–10.5)

## 2014-08-25 LAB — HEMOGLOBIN A1C
HEMOGLOBIN A1C: 7 % — AB (ref 4.8–5.6)
MEAN PLASMA GLUCOSE: 154 mg/dL

## 2014-08-25 MED ORDER — LACTULOSE 10 GM/15ML PO SOLN
20.0000 g | Freq: Every day | ORAL | Status: DC | PRN
  Filled 2014-08-25: qty 30

## 2014-08-25 NOTE — Progress Notes (Signed)
08/25/2014 10:09 AM External pacer turned off per orders. EPW rolled and taped per orders. Will continue to closely monitor patient.  Kelita Wallis, Arville Lime

## 2014-08-25 NOTE — Progress Notes (Addendum)
      [address]       [place] [address]             [phone]      3 Days Post-Op Procedure(s) (LRB): CORONARY ARTERY BYPASS GRAFTING (CABG), ON PUMP, TIMES ONE, USING RIGHT INTERNAL MAMMARY ARTERY (N/A) AORTIC VALVE REPLACEMENT (AVR) (N/A) TRANSESOPHAGEAL ECHOCARDIOGRAM (TEE) (N/A)   Subjective:  Ms. Sharon Hubbard has no new complaints today.    Objective: Vital signs in last 24 hours: Temp:  [97.7 F (36.5 C)-98.5 F (36.9 C)] 97.7 F (36.5 C) (08/04 0444) Pulse Rate:  [63-72] 67 (08/04 0444) Cardiac Rhythm:  [-] Atrial paced (08/04 0736) Resp:  [16-20] 18 (08/04 0444) BP: (133-161)/(50-99) 138/80 mmHg (08/04 0444) SpO2:  [94 %-98 %] 96 % (08/04 0444) Weight:  [137 lb 2 oz (62.2 kg)] 137 lb 2 oz (62.2 kg) (08/04 0444)  Intake/Output from previous day: 08/03 0701 - 08/04 0700 In: 470 [P.O.:360; I.V.:60; IV Piggyback:50] Out: 1930 [TUUEK:8003] Intake/Output this shift: Total I/O In: -  Out: 400 [Urine:400]  General appearance: alert, cooperative and no distress Heart: regular rate and rhythm Lungs: clear to auscultation bilaterally Abdomen: soft, non-tender; bowel sounds normal; no masses,  no organomegaly Extremities: edema trace Wound: clean and dry  Lab Results:  Recent Labs  08/24/14 0400 08/25/14 0438  WBC 8.8 9.1  HGB 9.1* 9.0*  HCT 26.5* 26.5*  PLT 89* 101*   BMET:  Recent Labs  08/24/14 0400 08/25/14 0438  NA 137 137  K 3.7 4.1  CL 104 103  CO2 26 26  GLUCOSE 77 110*  BUN 11 18  CREATININE 1.07* 1.25*  CALCIUM 8.7* 8.7*    PT/INR:  Recent Labs  08/22/14 1345  LABPROT 17.3*  INR 1.40   ABG    Component Value Date/Time   PHART 7.373 08/22/2014 2000   HCO3 21.2 08/22/2014 2000   TCO2 20 08/23/2014 1652   ACIDBASEDEF 4.0* 08/22/2014 2000   O2SAT 92.0 08/22/2014 2000   CBG (last 3)   Recent Labs  08/24/14 1619 08/24/14 2102 08/25/14 0651  GLUCAP 203* 146* 105*    Assessment/Plan: S/P Procedure(s)  (LRB): CORONARY ARTERY BYPASS GRAFTING (CABG), ON PUMP, TIMES ONE, USING RIGHT INTERNAL MAMMARY ARTERY (N/A) AORTIC VALVE REPLACEMENT (AVR) (N/A) TRANSESOPHAGEAL ECHOCARDIOGRAM (TEE) (N/A)  1. CV- NSR, rate in the 70s-  Continue lopressor, lisinopril, norvasc 2. Pulm- off oxygen, no acute issues good use of IS 3. Renal- creatinine up to 1.25, remains hypervolemic, continue lasix 4. Thrombocytopenia- plts up to 103, continue to hold Lovenox 5. DM- sugars are little better, likely elevated due to steroid use...recs Levemir will follow sugars and add if needed 6. Expected post operative anemia- Hgb stable at 9.0 7. Dispo- patient stable, will place CSW consult for placement, watch creatinine, sugars, continue diuresis    LOS: 10 days    BARRETT, ERIN 08/25/2014  patient seen and examined, c/o pain in neck, back, incision- c/w musculoskeletal overall is making progress as expected  Remo Lipps C. Roxan Hockey, MD [facility] [phone]

## 2014-08-25 NOTE — Clinical Social Work Note (Signed)
Clinical Social Work Assessment  Patient Details  Name: Sharon Hubbard MRN: [id number] Date of Birth: 10-21-1933  Date of referral:  08/25/14               Reason for consult:  Facility Placement                Permission sought to share information with:  Facility Sport and exercise psychologist, Family Supports Permission granted to share information::  Yes, Verbal Permission Granted  Name::        Agency::  [facility]  Relationship::     Contact Information:     Housing/Transportation Living arrangements for the past 2 months:  Single Family Home Source of Information:  Patient Patient Interpreter Needed:  None Criminal Activity/Legal Involvement Pertinent to Current Situation/Hospitalization:  No - Comment as needed Significant Relationships:  Adult Children, Spouse Lives with:  Spouse Do you feel safe going back to the place where you live?  Yes Need for family participation in patient care:  No (Coment)  Care giving concerns:  Pt lives at home with spouse who pt is the primary caregiver for- pt reports that her husband had massive stroke leading to one side paralysis- pt husband is able to care for self but would be unable to provide physical assistance for pt at time of DC.  Pt also has son who is very involved in care   Facilities manager / plan:  CSW spoke with pt concerning PT recommendation for SNF  Employment status:  Retired Forensic scientist:  Medicare PT Recommendations:  [place] / Referral to community resources:  [place]  Patient/Family's Response to care:  Pt is agreeable to SNF and is hopeful that she can be placed in [place] which would be most convenient for her son to visit her at the facility  Patient/Family's Understanding of and Emotional Response to Diagnosis, Current Treatment, and Prognosis:  Pt seems to have good understanding of current prognosis/treatment and is hopeful that she will  recover to her prior level of functioning  Emotional Assessment Appearance:  Appears stated age Attitude/Demeanor/Rapport:    Affect (typically observed):  Appropriate, Pleasant Orientation:  Oriented to Self, Oriented to Place, Oriented to  Time, Oriented to Situation Alcohol / Substance use:  Not Applicable Psych involvement (Current and /or in the community):  No (Comment)  Discharge Needs  Concerns to be addressed:  Care Coordination Readmission within the last 30 days:  No Current discharge risk:  Physical Impairment Barriers to Discharge:  Continued Medical Work up   [employer], LCSW 08/25/2014, 2:55 PM

## 2014-08-25 NOTE — Care Management Important Message (Signed)
Important Message  Patient Details  Name: Sharon Hubbard MRN: [id number] Date of Birth: 11-05-1933   Medicare Important Message Given:  Yes-fourth notification given    Nathen May 08/25/2014, 12:02 [place] Message  Patient Details  Name: Sharon Hubbard MRN: [id number] Date of Birth: [birth year]   Medicare Important Message Given:  Yes-fourth notification given    Nathen May 08/25/2014, 12:02 PM

## 2014-08-25 NOTE — Clinical Social Work Placement (Addendum)
   CLINICAL SOCIAL WORK PLACEMENT  NOTE  Date:  08/25/2014  Patient Details  Name: DARIAH MCSORLEY MRN: [id number] Date of Birth: 01-06-1934  Clinical Social Work is seeking post-discharge placement for this patient at the [place] level of care (*CSW will initial, date and re-position this form in  chart as items are completed):  Yes   Patient/family provided with [facility] Work Department's list of facilities offering this level of care within the geographic area requested by the patient (or if unable, by the patient's family).  Yes   Patient/family informed of their freedom to choose among providers that offer the needed level of care, that participate in Medicare, Medicaid or managed care program needed by the patient, have an available bed and are willing to accept the patient.  Yes   Patient/family informed of [facility]'s ownership interest in [facility] and [facility], as well as of the fact that they are under no obligation to receive care at these facilities.  PASRR submitted to EDS on       PASRR number received on       Existing PASRR number confirmed on 08/25/14     FL2 transmitted to all facilities in geographic area requested by pt/family on 08/25/14     FL2 transmitted to all facilities within larger geographic area on       Patient informed that his/her managed care company has contracts with or will negotiate with certain facilities, including the following:         08/26/14  Patient/family informed of bed offers received.  Randall Hiss Alyene Predmore updated 08/29/14)  Patient chooses bed at  [facility]   Randall Hiss Edithe Dobbin updated 08/29/14)  Physician recommends and patient chooses bed at      Patient to be transferred to  [facility] on  08/29/14. Randall Hiss Aubreigh Fuerte updated 08/29/14)  Patient to be transferred to facility by  [facility] Randall Hiss Odis Turck updated 08/29/14)     Patient family notified on  08/29/14 of  transfer by [facility] Randall Hiss Shelvie Salsberry updated 08/29/14)  Name of family member notified:   Patient stated she will tell her son Treazure Nery.  CSW attempted to call son at work and home, and he was unavailable.  CSW left message on grandaughter Missy Martin's voice mail to inform her patient will be disharging today.  Randall Hiss Takelia Urieta updated 08/29/14)  PHYSICIAN      Additional Comment:    _______________________________________________ Cranford Mon, LCSW 08/25/2014, 2:58 PM   Jones Broom. Norval Morton, MSW, [place] 08/29/2014 4:29 PM Randall Hiss Lamika Connolly updated 08/29/14)

## 2014-08-25 NOTE — Progress Notes (Signed)
CARDIAC REHAB PHASE I   PRE:  Rate/Rhythm: 74 SR BP:  Sitting: 146/67        SaO2: 95 RA  MODE:  Ambulation: 150 ft   POST:  Rate/Rhythm: 82 SR  BP:  Sitting: 136/97         SaO2: 95 RA  Pt c/o pain this morning, states she feels much worse today than yesterday. Pt assisted to bedside commode prior to ambulation. Pt ambulated 150 ft on /ra, rolling walker, gait belt, assist x1, steady gait, tolerated fair. Pt does appear to have mild DOE, pt denies, denies CP, dizziness. Pt activity today limited by pain. Pt c/o 10/10 R shoulder/neck pain, 5/10 back pain, and 8/10 incisional pain. Pt requesting additional pain medication, RN notified. Pt to recliner after walk, feet elevated, call bell within reach. Encouraged IS, ambulation. Pt verbalized understanding. Will f/u tomorrow.   [id number]  Sharon Sciara, RN, BSN 08/25/2014 10:59 AM

## 2014-08-26 LAB — BASIC METABOLIC PANEL
ANION GAP: 10 (ref 5–15)
BUN: 19 mg/dL (ref 6–20)
CHLORIDE: 100 mmol/L — AB (ref 101–111)
CO2: 28 mmol/L (ref 22–32)
Calcium: 9 mg/dL (ref 8.9–10.3)
Creatinine, Ser: 1.33 mg/dL — ABNORMAL HIGH (ref 0.44–1.00)
GFR calc non Af Amer: 37 mL/min — ABNORMAL LOW (ref >60)
GFR, EST AFRICAN AMERICAN: 43 mL/min — AB (ref >60)
GLUCOSE: 143 mg/dL — AB (ref 65–99)
Potassium: 4.2 mmol/L (ref 3.5–5.1)
Sodium: 138 mmol/L (ref 135–145)

## 2014-08-26 LAB — GLUCOSE, CAPILLARY
GLUCOSE-CAPILLARY: 178 mg/dL — AB (ref 65–99)
GLUCOSE-CAPILLARY: 165 mg/dL — AB (ref 65–99)
Glucose-Capillary: 123 mg/dL — ABNORMAL HIGH (ref 65–99)
Glucose-Capillary: 191 mg/dL — ABNORMAL HIGH (ref 65–99)

## 2014-08-26 MED ORDER — FUROSEMIDE 40 MG PO TABS
40.0000 mg | ORAL_TABLET | Freq: Every day | ORAL | Status: DC
  Administered 2014-08-27 – 2014-08-29 (×3): 40 mg via ORAL
  Filled 2014-08-26 (×3): qty 1

## 2014-08-26 MED ORDER — POTASSIUM CHLORIDE CRYS ER 20 MEQ PO TBCR
20.0000 meq | EXTENDED_RELEASE_TABLET | Freq: Every day | ORAL | Status: DC
  Administered 2014-08-26 – 2014-08-29 (×4): 20 meq via ORAL
  Filled 2014-08-26 (×3): qty 1

## 2014-08-26 MED ORDER — MAGNESIUM HYDROXIDE 400 MG/5ML PO SUSP
30.0000 mL | Freq: Once | ORAL | Status: AC
  Administered 2014-08-26: 30 mL via ORAL
  Filled 2014-08-26: qty 30

## 2014-08-26 MED ORDER — HYDRALAZINE HCL 10 MG PO TABS
10.0000 mg | ORAL_TABLET | Freq: Two times a day (BID) | ORAL | Status: DC
  Administered 2014-08-26 – 2014-08-27 (×4): 10 mg via ORAL
  Filled 2014-08-26 (×7): qty 1

## 2014-08-26 NOTE — Progress Notes (Signed)
CARDIAC REHAB PHASE I   PRE:  Rate/Rhythm: 71 SR    BP: sitting 131/64    SaO2: 91-92 RA  MODE:  Ambulation: 240 ft   POST:  Rate/Rhythm: 85 SR    BP: sitting 141/58     SaO2: 95 RA  Pt sts she slept better but she is still feeling weak and fatigued. Needs encouragement to walk, increase distance, and sit in recliner. Slow pace with RW, min assist. To recliner after walk, practiced IS. Will continue to follow. [phone]   Sharon Hubbard [place] CES, ACSM 08/26/2014 10:21 AM

## 2014-08-26 NOTE — Progress Notes (Signed)
UR Completed. Refujio Haymer, RN, BSN.  [phone] 

## 2014-08-26 NOTE — Progress Notes (Signed)
Physical Therapy Treatment Patient Details Name: Sharon Hubbard MRN: [id number] DOB: Apr 14, 1933 Today's Date: 08/26/2014    History of Present Illness s/p CABG/AVR PMHx-polymyalgia, OA, multiple back surgeries, CAD/MI    PT Comments    Pt progressing well towards physical therapy goals. Continues to require verbal cues to maintain sternal precautions during transfers. Pt appears fatigued at end of gait training and was returned to the bed to rest (has had multiple back issues in the past and was feeling painful). Will continue to follow and progress as able per POC.   Follow Up Recommendations  SNF     Equipment Recommendations  None recommended by PT    Recommendations for Other Services       Precautions / Restrictions Precautions Precautions: Sternal;Fall Restrictions Weight Bearing Restrictions: Yes (Sternal)    Mobility  Bed Mobility Overal bed mobility: Needs Assistance Bed Mobility: Sit to Supine       Sit to supine: Min assist   General bed mobility comments: Min assist to elevate outside LE into bed. Pt able to scoot herself to middle of bed.   Transfers Overall transfer level: Needs assistance Equipment used: Rolling walker (2 wheeled);None Transfers: Sit to/from Stand Sit to Stand: Min guard         General transfer comment: Hands-on guarding to power-up to full standing position from chair (to no AD) and light guarding to stand to the RW.   Ambulation/Gait Ambulation/Gait assistance: Min guard Ambulation Distance (Feet): 300 Feet Assistive device: Rolling walker (2 wheeled) Gait Pattern/deviations: Step-through pattern;Decreased stride length;Trunk flexed Gait velocity: Decreased Gait velocity interpretation: Below normal speed for age/gender General Gait Details: 3 standing rest breaks towards end of gait training due to fatigue.    Stairs            Wheelchair Mobility    Modified Rankin (Stroke Patients Only)       Balance  Overall balance assessment: Needs assistance Sitting-balance support: Feet supported;No upper extremity supported Sitting balance-Leahy Scale: Fair     Standing balance support: No upper extremity supported Standing balance-Leahy Scale: Poor Standing balance comment: Unsteady without walker                    Cognition Arousal/Alertness: Awake/alert Behavior During Therapy: WFL for tasks assessed/performed Overall Cognitive Status: Within Functional Limits for tasks assessed                      Exercises      General Comments        Pertinent Vitals/Pain Pain Assessment: No/denies pain    Home Living                      Prior Function            PT Goals (current goals can now be found in the care plan section) Acute Rehab PT Goals Patient Stated Goal: regain independence and ability to care for her husband PT Goal Formulation: With patient Time For Goal Achievement: 09/07/14 Potential to Achieve Goals: Good Progress towards PT goals: Progressing toward goals    Frequency  Min 3X/week    PT Plan Current plan remains appropriate    Co-evaluation             End of Session Equipment Utilized During Treatment: Gait belt Activity Tolerance: Patient tolerated treatment well Patient left: in chair;with call bell/phone within reach     Time: 9485-4627 PT Time Calculation (min) (  ACUTE ONLY): 31 min  Charges:  $Gait Training: 8-22 mins $Therapeutic Activity: 8-22 mins                    G Codes:      Rolinda Roan September 13, 2014, 3:06 PM   Rolinda Roan, PT, DPT Acute Rehabilitation Services Pager: [phone]

## 2014-08-26 NOTE — Progress Notes (Signed)
CSW spoke to Admissions Director of [facility] this afternoon who has offered a bed for patient when stable.  She cannot accept over the weekend; awaiting stability per MD.  CSW was made aware late this afternoon that patient is a [place] resident; thus, special approval must be obtained from [place] ([place]) prior to patient being admitted to the SNF.  CSW is also unable to place patient without this PASARR number. CSW is attempting to reach patient's son Sonia Side to determine patient's state of actual residence.  If she is now living in [place]- then will be able to proceed with PASARR number.  She has an expired number from 2011. Message left for son to return call to this CSW to discuss further.  Lorie Phenix. Pauline Good, [place]

## 2014-08-26 NOTE — Progress Notes (Addendum)
      [address]       [place] [address]             [phone]        4 Days Post-Op Procedure(s) (LRB): CORONARY ARTERY BYPASS GRAFTING (CABG), ON PUMP, TIMES ONE, USING RIGHT INTERNAL MAMMARY ARTERY (N/A) AORTIC VALVE REPLACEMENT (AVR) (N/A) TRANSESOPHAGEAL ECHOCARDIOGRAM (TEE) (N/A)  Subjective: Patient states she had a better night. She is passing flatus but no bowel movement yet, despite being given laxative.  Objective: Vital signs in last 24 hours: Temp:  [97.5 F (36.4 C)-98.5 F (36.9 C)] 97.5 F (36.4 C) (08/05 0500) Pulse Rate:  [67-72] 72 (08/05 0500) Cardiac Rhythm:  [-] Normal sinus rhythm (08/05 0811) Resp:  [18] 18 (08/05 0500) BP: (122-163)/(56-77) 163/77 mmHg (08/05 0500) SpO2:  [95 %-97 %] 97 % (08/05 0500) Weight:  [134 lb 1.6 oz (60.827 kg)] 134 lb 1.6 oz (60.827 kg) (08/05 0500)  Pre op weight 59 kg Current Weight  08/26/14 134 lb 1.6 oz (60.827 kg)      Intake/Output from previous day: 08/04 0701 - 08/05 0700 In: 683 [P.O.:680; I.V.:3] Out: 3100 [Urine:3100]   Physical Exam:  Cardiovascular: RRR, systolic murmur Pulmonary: Slightly diminished at bases; no rales, wheezes, or rhonchi. Abdomen: Soft, non tender, bowel sounds present. Extremities: No lower extremity edema. Wounds: Clean and dry.  No erythema or signs of infection.  Lab Results: CBC: Recent Labs  08/24/14 0400 08/25/14 0438  WBC 8.8 9.1  HGB 9.1* 9.0*  HCT 26.5* 26.5*  PLT 89* 101*   BMET:  Recent Labs  08/25/14 0438 08/26/14 0404  NA 137 138  K 4.1 4.2  CL 103 100*  CO2 26 28  GLUCOSE 110* 143*  BUN 18 19  CREATININE 1.25* 1.33*  CALCIUM 8.7* 9.0    PT/INR:  Lab Results  Component Value Date   INR 1.40 08/22/2014   INR 1.06 08/21/2014   INR 1.08 08/16/2014   ABG:  INR: Will add last result for INR, ABG once components are confirmed Will add last 4 CBG results once components are confirmed  Assessment/Plan:  1. CV - SR in  the high 60's to low 70's . On Lopressor 12.5 mg bid, Lisinopril 10 mg daily, Norvasc 10 mg daily, and Plavix. Still hypertensive. Will not increase Lisinopril as creatinine increasing. Will start Hydralazine for bette BP control. 2.  Pulmonary - On room air. Encourage incentive spirometer 3. Volume Overload - On Lasix 40 mg bid with good diuresis. 4.  Acute blood loss anemia - H and H yesterday stable at 9 and 26.5 5. Thrombocytopenia-platelets yesterday stable at 101,000. Not on Lovenox 6. Creatinine slightly increased to 1.33. She is being diuresed and on Lisinopril. Will give Lasix once today and stop Lisinopril for now. 7.DM-CBGs 175/184/123. On Amaryl 4 mg bid and Insulin. 8. Remove EPW 9. MOM for constipation as lactulose did not work yesterday 46. Likely to SNF Monday, but will discuss with Dr. Burman Blacksmith MPA-C 08/26/2014,8:23 AM  Patient seen and examined, agree with above Should be ready for SNF by Monday  Remo Lipps C. Roxan Hockey, MD [facility] [phone]

## 2014-08-26 NOTE — Progress Notes (Signed)
dc'ed pacing wires pt. tolerated well 

## 2014-08-26 NOTE — Discharge Summary (Signed)
[address]       [place] [address]             [phone]              Discharge Summary  Name: Sharon Hubbard DOB: Sep 15, 1933 81 y.o. MRN: [id number]   Admission Date: 08/15/2014 Discharge Date: 08/29/2014    Admitting Diagnosis: Coronary artery disease Moderate aortic stenosis Unstable angina   Discharge Diagnosis:  Coronary artery disease Severe aortic stenosis Unstable angina Expected postoperative blood loss anemia   Patient Active Problem List   Diagnosis Date Noted  . S/P AVR 08/22/2014  . NSTEMI (non-ST elevated myocardial infarction)   . CAD S/P percutaneous coronary angioplasty   . Unstable angina 08/15/2014  . Chronic diastolic CHF (congestive heart failure) 06/13/2014  . CKD (chronic kidney disease) stage 3, GFR 30-59 ml/min 03/09/2014  . Itching 07/29/2013  . Edema 07/29/2013  . Femoral artery hematoma complicating cardiac catheterization 05/25/2013  . S/P evacuation of hematoma 04/13/2013  . Thrombocytopenia 03/29/2013  . Shortness of breath on exertion   . Aortic stenosis   . Femoral bruit   . Preoperative evaluation to rule out surgical contraindication   . Pancreas cyst   . CAD (coronary artery disease)   . RBBB (right bundle branch block)   . Groin hematoma   . Hypertension   . Type 2 diabetes mellitus treated with insulin   . Dyslipidemia   . GERD (gastroesophageal reflux disease)   . Polymyalgia rheumatica   . Bradycardia   . Ejection fraction       Procedures: CORONARY ARTERY BYPASS GRAFTING x 1 (Free Right internal mammary artery to right coronary artery) AORTIC VALVE REPLACEMENT (21 mm [facility] Ease pericardial tissue valve) - 08/22/2014    HPI:  The patient is a 81 y.o. female with a known history of CAD. She had a stent placed in her circumflex in 2011 and a DES placed in the LAD in 2012. Her past history is also significant for moderate AS, diastolic CHF, type II diabetes, stage III  CKD, spinal stenosis, hypertension, steroid dependent polymyalgia rheumatica, and TIAs.   She has been having substernal chest pain radiating to her left arm for the past 6-8 weeks. She has noticed this occurs several times a week and has been getting more frequent leading up to admission. It sometimes occurs at rest. It usually resolves with one sublingual NTG but sometimes it takes 2 of those. On the day of admission, she had a severe episode that did not resolve and called EMS. They gave her 2 more NTG and the pain finally resolved after about 45 minutes.  She was taken to [facility]. Her initial troponin was negative, but he ECG showed anterolateral ST depression. She ruled out for MI. She was transferred to [facility] for further cardiac workup.  Marland Kitchen  Hospital Course:  The patient was admitted to [facility] on 08/15/2014. Cardiac catheterization was performed on 7/26 which showed a 99% stenosis of the proximal-mid RCA.  An attempt was made to angioplasty the vessel, but a wire could not be passed. She was returned to the cath lab the following day and again, angioplasty was unsuccessful.  A cardiac surgery consult was requested for single vessel CABG.  Dr. Roxan Hockey saw the patient and reviewed her films and agreed with the need for CABG. Also, because of her known history of moderate aortic stenosis, an echocardiogram was repeated, which showed progression  of her disease with valve area of 0.9 cm2.  All risks, benefits and alternatives of surgery were explained in detail, and the patient agreed to proceed. Because she had previously been on Plavix, surgery was delayed to allow Plavix washout.  The patient was taken to the operating room on 08/23/2014 and underwent the above procedure.    The postoperative course has been notable for hypertension. The patient has been restarted on antihypertensive medications and the doses have been titrated as needed. She was originally started on an ACE-I, but  her creatinine became mildly elevated and this was discontinued. Hydralazine was started because of hypertension;however, she devleoped a headache and Hydralazine was stopped. Plavix has been resumed for her prior stents. Blood sugars have been controlled on home meds (A1C=7.0). She has remained in sinus rhythm.  She has had a mild postop blood loss anemia which has not required transfusion.  Incisions are healing well.  The patient is ambulating in the halls and tolerating a regular diet.  She then became hypertensive again over this past weekend. As a result, her Lisinopril was restarted at 10 mg daily, as she had taken preoperatively. Her last creatinine was down to 1.16. Because her BP still remained in the 150's and 160's, Lisinopril was increased to 20 mg daily. The SNF will need to check her creatinine sometime this week.  It is felt she would benefit from short term skilled nursing placement post discharge.  The patient is overall medically stable on today's date for transfer once a bed is available.  We ask the SNF to please do the following: 1. Please obtain vital signs at least one time daily 2.Please weigh the patient daily. If he or she continues to gain weight or develops lower extremity edema, contact the office at [phone]. 3. Ambulate patient at least three times daily and please use sternal precautions.   Recent vital signs:  Filed Vitals:   08/29/14 0506  BP: 162/56  Pulse: 61  Temp: 98 F (36.7 C)  Resp: 18    Recent laboratory studies:  CBC:No results for input(s): WBC, HGB, HCT, PLT in the last 72 hours. BMET:   Recent Labs  08/27/14 0445 08/29/14 0326  NA 138 136  K 4.1 4.0  CL 103 97*  CO2 28 27  GLUCOSE 161* 203*  BUN 22* 26*  CREATININE 1.23* 1.16*  CALCIUM 8.9 8.9    PT/INR: No results for input(s): LABPROT, INR in the last 72 hours.   Discharge Medications:     Medication List    STOP taking these medications         HYDROcodone-acetaminophen 10-325 MG per tablet  Commonly known as:  NORCO  Replaced by:  HYDROcodone-acetaminophen 5-325 MG per tablet     isosorbide mononitrate 60 MG 24 hr tablet  Commonly known as:  IMDUR     nitroGLYCERIN 0.4 MG SL tablet  Commonly known as:  NITROSTAT      TAKE these medications        amLODipine 10 MG tablet  Commonly known as:  NORVASC  Take 10 mg by mouth daily.     aspirin 81 MG tablet  Take 81 mg by mouth daily.     clopidogrel 75 MG tablet  Commonly known as:  PLAVIX  Take 1 tablet (75 mg total) by mouth daily.     divalproex 500 MG 24 hr tablet  Commonly known as:  DEPAKOTE ER  Take 500 mg by mouth daily.  furosemide 40 MG tablet  Commonly known as:  LASIX  Take one tab by mouth every morning     glimepiride 4 MG tablet  Commonly known as:  AMARYL  Take 4 mg by mouth 2 (two) times daily.     HUMALOG KWIKPEN 100 UNIT/ML KiwkPen  Generic drug:  insulin lispro  Inject 0-10 Units into the skin 3 (three) times daily. Pt on sliding scale, units is based off of CBG readings.     HYDROcodone-acetaminophen 5-325 MG per tablet  Commonly known as:  NORCO/VICODIN  Take 1 tablet by mouth every 4 (four) hours as needed for severe pain.     latanoprost 0.005 % ophthalmic solution  Commonly known as:  XALATAN  Place 1 drop into both eyes at bedtime.     lisinopril 20 MG tablet  Commonly known as:  PRINIVIL,ZESTRIL  Take 1 tablet (20 mg total) by mouth daily.     metoprolol tartrate 25 MG tablet  Commonly known as:  LOPRESSOR  Take 0.5 tablets (12.5 mg total) by mouth 2 (two) times daily.     MYRBETRIQ 25 MG Tb24 tablet  Generic drug:  mirabegron ER  Take 25 mg by mouth daily.     pantoprazole 40 MG tablet  Commonly known as:  PROTONIX  Take 40 mg by mouth daily.     potassium chloride SA 20 MEQ tablet  Commonly known as:  K-DUR,KLOR-CON  Take 20 mEq by mouth daily.     predniSONE 1 MG tablet  Commonly known as:  DELTASONE  Take 4  mg by mouth every morning.     rosuvastatin 20 MG tablet  Commonly known as:  CRESTOR  Take 20 mg by mouth daily.     timolol 0.5 % ophthalmic solution  Commonly known as:  TIMOPTIC  Place 1 drop into both eyes daily.       The patient has been discharged on:  1.Beta Blocker: Yes [ x ]  No [ ]   If No, reason:    2.Ace Inhibitor/ARB: Yes [ x]  No [  ]  If No, reason:    3.Statin: Yes [ x ]  No [ ]   If No, reason:    4.Ecasa: Yes [ x ]  No [ ]   If No, reason:   Discharge Instructions:   1. Please obtain vital signs at least one time daily 2. Please weigh the patient daily. If he or she continues to gain weight or develops lower extremity edema, contact the office at [phone]. 3. Ambulate patient at least three times daily and please use sternal precautions. 4. The patient is to refrain from driving, heavy lifting or strenuous activity.   5. May shower daily and clean incisions with soap and water.   6. May resume regular diet.    Follow Up: Follow-up Information    Follow up with Dola Argyle, MD On 10/10/2014.   Specialty:  Cardiology   Why:  See MD at 1:00 pm, please arrive 15 minutes early for paperwork.   Contact information:   [place] [address] [phone]       Follow up with Melrose Nakayama, MD On 09/27/2014.   Specialty:  Cardiothoracic Surgery   Why:  Have a chest x-ray at [facility] at 10:30, then see MD at 11:30   Contact information:   [address] [place] [address] [phone]       Follow up with  Neale Burly, MD.   Specialty:  Internal Medicine   Why:  Call for follow up of diabetes management and further surveillance of [facility]   Contact information:   [place] [address] [phone]         Follow-up Information    Follow up with Dola Argyle, MD On 10/10/2014.   Specialty:  Cardiology   Why:  See MD at 1:00 pm, please arrive 15 minutes early  for paperwork.   Contact information:   [place] [address] [phone]       Follow up with Melrose Nakayama, MD On 09/27/2014.   Specialty:  Cardiothoracic Surgery   Why:  Have a chest x-ray at [facility] at 10:30, then see MD at 11:30   Contact information:   [address] [place] [address] [phone]       Follow up with Neale Burly, MD.   Specialty:  Internal Medicine   Why:  Call for follow up of diabetes management and further surveillance of [facility]   Contact information:   [place] [address] [phone]       Lars Pinks M PA-C 08/29/2014, 1:28 PM

## 2014-08-27 LAB — GLUCOSE, CAPILLARY
GLUCOSE-CAPILLARY: 107 mg/dL — AB (ref 65–99)
GLUCOSE-CAPILLARY: 256 mg/dL — AB (ref 65–99)
GLUCOSE-CAPILLARY: 178 mg/dL — AB (ref 65–99)
Glucose-Capillary: 145 mg/dL — ABNORMAL HIGH (ref 65–99)

## 2014-08-27 LAB — BASIC METABOLIC PANEL
ANION GAP: 7 (ref 5–15)
BUN: 22 mg/dL — ABNORMAL HIGH (ref 6–20)
CALCIUM: 8.9 mg/dL (ref 8.9–10.3)
CO2: 28 mmol/L (ref 22–32)
Chloride: 103 mmol/L (ref 101–111)
Creatinine, Ser: 1.23 mg/dL — ABNORMAL HIGH (ref 0.44–1.00)
GFR calc Af Amer: 47 mL/min — ABNORMAL LOW (ref >60)
GFR calc non Af Amer: 41 mL/min — ABNORMAL LOW (ref >60)
Glucose, Bld: 161 mg/dL — ABNORMAL HIGH (ref 65–99)
POTASSIUM: 4.1 mmol/L (ref 3.5–5.1)
Sodium: 138 mmol/L (ref 135–145)

## 2014-08-27 NOTE — Progress Notes (Addendum)
      [address]       [place] [address]             [phone]      5 Days Post-Op Procedure(s) (LRB): CORONARY ARTERY BYPASS GRAFTING (CABG), ON PUMP, TIMES ONE, USING RIGHT INTERNAL MAMMARY ARTERY (N/A) AORTIC VALVE REPLACEMENT (AVR) (N/A) TRANSESOPHAGEAL ECHOCARDIOGRAM (TEE) (N/A)   Subjective:  Ms. Cavness complains of headache and sternal discomfort this morning.  Objective: Vital signs in last 24 hours: Temp:  [98.1 F (36.7 C)-98.2 F (36.8 C)] 98.1 F (36.7 C) (08/06 0519) Pulse Rate:  [67-73] 67 (08/06 0519) Cardiac Rhythm:  [-] Normal sinus rhythm (08/05 2124) Resp:  [18-20] 18 (08/06 0519) BP: (104-143)/(49-85) 143/57 mmHg (08/06 0519) SpO2:  [94 %-96 %] 94 % (08/06 0519) Weight:  [133 lb 1.6 oz (60.374 kg)] 133 lb 1.6 oz (60.374 kg) (08/06 0519)  Intake/Output from previous day: 08/05 0701 - 08/06 0700 In: 480 [P.O.:480] Out: 1475 [Urine:1475]  General appearance: alert, cooperative and no distress Heart: regular rate and rhythm Lungs: clear to auscultation bilaterally Abdomen: soft, non-tender; bowel sounds normal; no masses,  no organomegaly Extremities: edema trace Wound: clean and dry  Lab Results:  Recent Labs  08/25/14 0438  WBC 9.1  HGB 9.0*  HCT 26.5*  PLT 101*   BMET:  Recent Labs  08/26/14 0404 08/27/14 0445  NA 138 138  K 4.2 4.1  CL 100* 103  CO2 28 28  GLUCOSE 143* 161*  BUN 19 22*  CREATININE 1.33* 1.23*  CALCIUM 9.0 8.9    PT/INR: No results for input(s): LABPROT, INR in the last 72 hours. ABG    Component Value Date/Time   PHART 7.373 08/22/2014 2000   HCO3 21.2 08/22/2014 2000   TCO2 20 08/23/2014 1652   ACIDBASEDEF 4.0* 08/22/2014 2000   O2SAT 92.0 08/22/2014 2000   CBG (last 3)   Recent Labs  08/26/14 1109 08/26/14 1655 08/26/14 2108  GLUCAP 191* 178* 165*    Assessment/Plan: S/P Procedure(s) (LRB): CORONARY ARTERY BYPASS GRAFTING (CABG), ON PUMP, TIMES ONE, USING RIGHT  INTERNAL MAMMARY ARTERY (N/A) AORTIC VALVE REPLACEMENT (AVR) (N/A) TRANSESOPHAGEAL ECHOCARDIOGRAM (TEE) (N/A)  1. CV- NSR, rate remains in the 60-70s. HTN improved- on Lopressor, and hydralazine, Norvasc, Plavix 2. Pulm- on room air, continue IS 3. Renal- weight remains elevated, continue Lasix 4. DM- cbgs controlled, continue current regimen 5. Deconditioning- SNF possibly Monday 6. Dispo- patient stable, continue to watch blood pressure, creatinine stable   LOS: 12 days    BARRETT, ERIN 08/27/2014   Chart reviewed, patient examined, agree with above.

## 2014-08-27 NOTE — Progress Notes (Signed)
Pt walked 217ft. Time 2 today tolerated well

## 2014-08-27 NOTE — Progress Notes (Signed)
CARDIAC REHAB PHASE I   PRE:  Rate/Rhythm: 78 SR  BP:  Supine:   Sitting: 158/63  Standing:    SaO2: 95%RA  MODE:  Ambulation: 350 ft   POST:  Rate/Rhythm: 82 SR  BP:  Supine:   Sitting: 147/57  Standing:    SaO2: 95%RA 1119-1143 Pt walked 350 ft on RA with rolling walker and asst x 1 with fairly steady gait. Tolerated well. Stopped several times to rest. C/o arms weak and tired. To recliner with call bell.    Graylon Good, RN BSN  08/27/2014 11:38 AM

## 2014-08-28 LAB — GLUCOSE, CAPILLARY
GLUCOSE-CAPILLARY: 322 mg/dL — AB (ref 65–99)
GLUCOSE-CAPILLARY: 236 mg/dL — AB (ref 65–99)
Glucose-Capillary: 164 mg/dL — ABNORMAL HIGH (ref 65–99)
Glucose-Capillary: 165 mg/dL — ABNORMAL HIGH (ref 65–99)

## 2014-08-28 MED ORDER — HYDROCODONE-ACETAMINOPHEN 5-325 MG PO TABS
1.0000 | ORAL_TABLET | ORAL | Status: DC | PRN
  Administered 2014-08-28: 1 via ORAL
  Administered 2014-08-28 (×2): 2 via ORAL
  Administered 2014-08-28: 1 via ORAL
  Administered 2014-08-29 (×3): 2 via ORAL
  Filled 2014-08-28: qty 1
  Filled 2014-08-28 (×4): qty 2
  Filled 2014-08-28: qty 1
  Filled 2014-08-28: qty 2

## 2014-08-28 MED ORDER — LISINOPRIL 10 MG PO TABS
10.0000 mg | ORAL_TABLET | Freq: Every day | ORAL | Status: DC
  Administered 2014-08-28 – 2014-08-29 (×2): 10 mg via ORAL
  Filled 2014-08-28 (×2): qty 1

## 2014-08-28 NOTE — Progress Notes (Signed)
      [address]       [place] [address]             [phone]      6 Days Post-Op Procedure(s) (LRB): CORONARY ARTERY BYPASS GRAFTING (CABG), ON PUMP, TIMES ONE, USING RIGHT INTERNAL MAMMARY ARTERY (N/A) AORTIC VALVE REPLACEMENT (AVR) (N/A) TRANSESOPHAGEAL ECHOCARDIOGRAM (TEE) (N/A)   Subjective:  Sharon Hubbard continues to complain of headache.  She states medications are not providing much relief.  She has no other complaints.  Objective: Vital signs in last 24 hours: Temp:  [97.4 F (36.3 C)-98.4 F (36.9 C)] 98.2 F (36.8 C) (08/07 0511) Pulse Rate:  [68] 68 (08/07 0511) Cardiac Rhythm:  [-] Normal sinus rhythm (08/07 0745) Resp:  [18] 18 (08/07 0511) BP: (133-155)/(49-61) 149/61 mmHg (08/07 0511) SpO2:  [95 %-96 %] 96 % (08/07 0511) Weight:  [129 lb 10.1 oz (58.8 kg)] 129 lb 10.1 oz (58.8 kg) (08/07 0511)  Intake/Output from previous day: 08/06 0701 - 08/07 0700 In: -  Out: [place] [Urine:1550]  General appearance: alert, cooperative and no distress Heart: regular rate and rhythm Lungs: clear to auscultation bilaterally Abdomen: soft, non-tender; bowel sounds normal; no masses,  no organomegaly Extremities: edema trac Wound: clean and dry  Lab Results: No results for input(s): WBC, HGB, HCT, PLT in the last 72 hours. BMET:  Recent Labs  08/26/14 0404 08/27/14 0445  NA 138 138  K 4.2 4.1  CL 100* 103  CO2 28 28  GLUCOSE 143* 161*  BUN 19 22*  CREATININE 1.33* 1.23*  CALCIUM 9.0 8.9    PT/INR: No results for input(s): LABPROT, INR in the last 72 hours. ABG    Component Value Date/Time   PHART 7.373 08/22/2014 2000   HCO3 21.2 08/22/2014 2000   TCO2 20 08/23/2014 1652   ACIDBASEDEF 4.0* 08/22/2014 2000   O2SAT 92.0 08/22/2014 2000   CBG (last 3)   Recent Labs  08/27/14 1616 08/27/14 2059 08/28/14 0606  GLUCAP 256* 178* 164*    Assessment/Plan: S/P Procedure(s) (LRB): CORONARY ARTERY BYPASS GRAFTING (CABG), ON PUMP,  TIMES ONE, USING RIGHT INTERNAL MAMMARY ARTERY (N/A) AORTIC VALVE REPLACEMENT (AVR) (N/A) TRANSESOPHAGEAL ECHOCARDIOGRAM (TEE) (N/A)  1. CV- NSR, remains hypertensive- continue Lopressor, Norvasc, will d/c hydralazine as could be cause of headache and restart home Lisinopril... If necessary can also restart home Imdur if no improvement 2. Pulm- no acute issues off oxygen 3. Renal- creatinine has been relatively stable, will repeat BMET in AM, weight is almost at baseline on Lasix 4. Pain control- patient with headache, could be related to oxy, will transition to home narcotic pain medication 5. DM- cbgs remain controlled 6. Deconditioning- SNF placemetn 7. Dispo- patient continues to have headache will adjust medications to see if resolves, repeat BMET, watch BP   LOS: 13 days    Sharon Hubbard, Sharon Hubbard 08/28/2014

## 2014-08-29 DIAGNOSIS — I5022 Chronic systolic (congestive) heart failure: Secondary | ICD-10-CM | POA: Diagnosis not present

## 2014-08-29 DIAGNOSIS — E119 Type 2 diabetes mellitus without complications: Secondary | ICD-10-CM | POA: Diagnosis not present

## 2014-08-29 DIAGNOSIS — I10 Essential (primary) hypertension: Secondary | ICD-10-CM | POA: Diagnosis not present

## 2014-08-29 DIAGNOSIS — I2510 Atherosclerotic heart disease of native coronary artery without angina pectoris: Secondary | ICD-10-CM | POA: Diagnosis not present

## 2014-08-29 DIAGNOSIS — I129 Hypertensive chronic kidney disease with stage 1 through stage 4 chronic kidney disease, or unspecified chronic kidney disease: Secondary | ICD-10-CM | POA: Diagnosis not present

## 2014-08-29 DIAGNOSIS — R2689 Other abnormalities of gait and mobility: Secondary | ICD-10-CM | POA: Diagnosis not present

## 2014-08-29 DIAGNOSIS — Q238 Other congenital malformations of aortic and mitral valves: Secondary | ICD-10-CM | POA: Diagnosis not present

## 2014-08-29 DIAGNOSIS — Z952 Presence of prosthetic heart valve: Secondary | ICD-10-CM | POA: Diagnosis not present

## 2014-08-29 DIAGNOSIS — I209 Angina pectoris, unspecified: Secondary | ICD-10-CM | POA: Diagnosis not present

## 2014-08-29 DIAGNOSIS — M6281 Muscle weakness (generalized): Secondary | ICD-10-CM | POA: Diagnosis not present

## 2014-08-29 DIAGNOSIS — M353 Polymyalgia rheumatica: Secondary | ICD-10-CM | POA: Diagnosis not present

## 2014-08-29 DIAGNOSIS — I200 Unstable angina: Secondary | ICD-10-CM | POA: Diagnosis not present

## 2014-08-29 DIAGNOSIS — I214 Non-ST elevation (NSTEMI) myocardial infarction: Secondary | ICD-10-CM | POA: Diagnosis not present

## 2014-08-29 DIAGNOSIS — Z951 Presence of aortocoronary bypass graft: Secondary | ICD-10-CM | POA: Diagnosis not present

## 2014-08-29 DIAGNOSIS — I5032 Chronic diastolic (congestive) heart failure: Secondary | ICD-10-CM | POA: Diagnosis not present

## 2014-08-29 DIAGNOSIS — I25119 Atherosclerotic heart disease of native coronary artery with unspecified angina pectoris: Secondary | ICD-10-CM | POA: Diagnosis not present

## 2014-08-29 DIAGNOSIS — N183 Chronic kidney disease, stage 3 (moderate): Secondary | ICD-10-CM | POA: Diagnosis not present

## 2014-08-29 LAB — BASIC METABOLIC PANEL
Anion gap: 12 (ref 5–15)
BUN: 26 mg/dL — ABNORMAL HIGH (ref 6–20)
CO2: 27 mmol/L (ref 22–32)
CREATININE: 1.16 mg/dL — AB (ref 0.44–1.00)
Calcium: 8.9 mg/dL (ref 8.9–10.3)
Chloride: 97 mmol/L — ABNORMAL LOW (ref 101–111)
GFR calc Af Amer: 51 mL/min — ABNORMAL LOW (ref >60)
GFR, EST NON AFRICAN AMERICAN: 44 mL/min — AB (ref >60)
GLUCOSE: 203 mg/dL — AB (ref 65–99)
POTASSIUM: 4 mmol/L (ref 3.5–5.1)
SODIUM: 136 mmol/L (ref 135–145)

## 2014-08-29 LAB — GLUCOSE, CAPILLARY
Glucose-Capillary: 150 mg/dL — ABNORMAL HIGH (ref 65–99)
Glucose-Capillary: 303 mg/dL — ABNORMAL HIGH (ref 65–99)

## 2014-08-29 MED ORDER — FUROSEMIDE 40 MG PO TABS: ORAL_TABLET | ORAL | Status: DC

## 2014-08-29 MED ORDER — METOPROLOL TARTRATE 25 MG PO TABS
12.5000 mg | ORAL_TABLET | Freq: Two times a day (BID) | ORAL | Status: DC
Start: 2014-08-29 — End: 2015-07-13

## 2014-08-29 MED ORDER — LISINOPRIL 20 MG PO TABS: 20.0000 mg | ORAL_TABLET | Freq: Every day | ORAL | Status: DC

## 2014-08-29 MED ORDER — HYDROCODONE-ACETAMINOPHEN 5-325 MG PO TABS: 1.0000 | ORAL_TABLET | ORAL | Status: DC | PRN

## 2014-08-29 MED ORDER — INSULIN ASPART 100 UNIT/ML ~~LOC~~ SOLN
5.0000 [IU] | Freq: Three times a day (TID) | SUBCUTANEOUS | Status: DC
Start: 2014-08-29 — End: 2014-08-29

## 2014-08-29 NOTE — Progress Notes (Signed)
CARDIAC REHAB PHASE I   PRE:  Rate/Rhythm: 60 sR  BP:  Sitting: 142/43        SaO2: 95 RA  MODE:  Ambulation: 200 ft   POST:  Rate/Rhythm: 75 SR  BP:  Sitting: 154/56         SaO2: 96 RA  Pt lying in bed, states she is very tired and has a headache and received pain medicine "too late." Pt agreeable to walk. Pt ambulated 200 ft on RA, rolling walker, assist x1, slow steady gait, tolerated well. Very brief standing rest x 2. Pt limited by pain and fatigue. Pt denies CP, dizziness, DOE. Pt to recliner after walk, feet elevated, call bell within reach.    [id number]   Lenna Sciara, RN, BSN 08/29/2014 10:53 AM

## 2014-08-29 NOTE — Progress Notes (Signed)
CSW was finally able to reach patient's son Thomasina Housley who confirmed that patient is a [place] resident.  [place].  CSW discussed that as a [place] resident- patient will require a special PASARR number. Discussed feasibility of placement in [place] for short term rehab.  Son relates that this would not be acceptable as he lives in [place] and all of patient's medical providers are in [place]. He strongly requests placement at [facility].  CSW will submit request in the morning to [facility] for temporary PASARR number as she will only require short term SNF.  [facility] will accept patient as soon as PASARR number is assigned and MD deems patient to be medically stable.  Lorie Phenix. Pauline Good, [place]

## 2014-08-29 NOTE — Care Management Note (Signed)
Case Management Note Note started by Luz Lex RNCM  Patient Details  Name: Sharon Hubbard MRN: [id number] Date of Birth: 1933/04/24  Subjective/Objective:         Patient lives at home with husband.  Husband has had strokes and she states is unable to care for her on discharge.  Has children but none she states are able to care for her on discharge.  She plan to go to SNF - for rehab.  States she has done this in the past.             Action/Plan:   Expected Discharge Date:       08/29/14           Expected Discharge Plan:  Skilled Nursing Facility  In-House Referral:  Clinical Social Work  Discharge planning Services  CM Consult  Post Acute Care Choice:    Choice offered to:     DME Arranged:    DME Agency:     HH Arranged:    [place] Agency:     Status of Service:  Completed, signed off  Medicare Important Message Given:  Yes-second notification given Date Medicare IM Given:   08/29/14 Medicare IM give by:    Date Additional Medicare IM Given:    Additional Medicare Important Message give by:     If discussed at [place] of Stay Meetings, dates discussed:    Additional Comments:  Dawayne Patricia, RN 08/29/2014, 3:20 PM

## 2014-08-29 NOTE — Progress Notes (Signed)
CARDIAC REHAB PHASE I  [facility] discharge education completed. Reviewed IS, sternal precautions, activity progression, exercise, risk factors, diet including heart healthy, carb counting, and sodium restrictions, and phase 2 cardiac rehab. Pt verbalized understanding. Pt agrees to phase 2 cardiac rehab. Will send referral to [facility]. Pt assisted to bedside commode, returned to chair, feet elevated, call bell within reach.  [id number] Lenna Sciara, RN, BSN 08/29/2014 2:36 PM

## 2014-08-29 NOTE — Discharge Instructions (Signed)
We ask the SNF to please do the following: 1. Please obtain vital signs at least one time daily 2.Please weigh the patient daily. If he or she continues to gain weight or develops lower extremity edema, contact the office at [phone]. 3. Ambulate patient at least three times daily and please use sternal precautions.   Activity: 1.May walk up steps                2.No lifting more than ten pounds for four weeks.                 3.No driving for four weeks.                4.Stop any activity that causes chest pain, shortness of breath, dizziness, sweating or excessive weakness.                5.Avoid straining.                6.Continue with your breathing exercises daily.  Diet: Diabetic diet and Low fat, Low salt diet  Wound Care: May shower.  Clean wounds with mild soap and water daily. Contact the office at [phone] if any problems arise.    Coronary Artery Bypass Grafting, Care After Refer to this sheet in the next few weeks. These instructions provide you with information on caring for yourself after your procedure. Your health care provider may also give you more specific instructions. Your treatment has been planned according to current medical practices, but problems sometimes occur. Call your health care provider if you have any problems or questions after your procedure. WHAT TO EXPECT AFTER THE PROCEDURE Recovery from surgery will be different for everyone. Some people feel well after 3 or 4 weeks, while for others it takes longer. After your procedure, it is typical to have the following:  Nausea and a lack of appetite.   Constipation.  Weakness and fatigue.   Depression or irritability.   Pain or discomfort at your incision site. HOME CARE INSTRUCTIONS  Take medicines only as directed by your health care provider. Do not stop taking medicines or start any new medicines without first checking with your health care provider.  Take your pulse as directed by  your health care provider.  Perform deep breathing as directed by your health care provider. If you were given a device called an incentive spirometer, use it to practice deep breathing several times a day. Support your chest with a pillow or your arms when you take deep breaths or cough.  Keep incision areas clean, dry, and protected. Remove or change any bandages (dressings) only as directed by your health care provider. You may have skin adhesive strips over the incision areas. Do not take the strips off. They will fall off on their own.  Check incision areas daily for any swelling, redness, or drainage.  If incisions were made in your legs, do the following:  Avoid crossing your legs.   Avoid sitting for long periods of time. Change positions every 30 minutes.   Elevate your legs when you are sitting.  Wear compression stockings as directed by your health care provider. These stockings help keep blood clots from forming in your legs.  Take showers once your health care provider approves. Until then, only take sponge baths. Pat incisions dry. Do not rub incisions with a washcloth or towel. Do not take baths, swim, or use a hot tub until your health care provider approves.  Eat  foods that are high in fiber, such as raw fruits and vegetables, whole grains, beans, and nuts. Meats should be lean cut. Avoid canned, processed, and fried foods.  Drink enough fluid to keep your urine clear or pale yellow.  Weigh yourself every day. This helps identify if you are retaining fluid that may make your heart and lungs work harder.  Rest and limit activity as directed by your health care provider. You may be instructed to:  Stop any activity at once if you have chest pain, shortness of breath, irregular heartbeats, or dizziness. Get help right away if you have any of these symptoms.  Move around frequently for short periods or take short walks as directed by your health care provider. Increase  your activities gradually. You may need physical therapy or cardiac rehabilitation to help strengthen your muscles and build your endurance.  Avoid lifting, pushing, or pulling anything heavier than 10 lb (4.5 kg) for at least 6 weeks after surgery.  Do not drive until your health care provider approves.  Ask your health care provider when you may return to work.  Ask your health care provider when you may resume sexual activity.  Keep all follow-up visits as directed by your health care provider. This is important. SEEK MEDICAL CARE IF:  You have swelling, redness, increasing pain, or drainage at the site of an incision.  You have a fever.  You have swelling in your ankles or legs.  You have pain in your legs.   You gain 2 or more pounds (0.9 kg) a day.  You are nauseous or vomit.  You have diarrhea. SEEK IMMEDIATE MEDICAL CARE IF:  You have chest pain that goes to your jaw or arms.  You have shortness of breath.   You have a fast or irregular heartbeat.   You notice a "clicking" in your breastbone (sternum) when you move.   You have numbness or weakness in your arms or legs.  You feel dizzy or light-headed.  MAKE SURE YOU:  Understand these instructions.  Will watch your condition.  Will get help right away if you are not doing well or get worse. Document Released: 07/27/2004 Document Revised: 05/24/2013 Document Reviewed: 06/16/2012 [facility] Patient Information 2015 [place], [place]. This information is not intended to replace advice given to you by your health care provider. Make sure you discuss any questions you have with your health care provider.  Aortic Valve Replacement, Care After Refer to this sheet in the next few weeks. These instructions provide you with information on caring for yourself after your procedure. Your health care provider may also give you specific instructions. Your treatment has been planned according to current medical practices,  but problems sometimes occur. Call your health care provider if you have any problems or questions after your procedure. HOME CARE INSTRUCTIONS   Take medicines only as directed by your health care provider.  If your health care provider has prescribed elastic stockings, wear them as directed.  Take frequent naps or rest often throughout the day.  Avoid lifting over 10 lbs (4.5 kg) or pushing or pulling things with your arms for 6-8 weeks or as directed by your health care provider.  Avoid driving or airplane travel for 4-6 weeks after surgery or as directed by your health care provider. If you are riding in a car for an extended period, stop every 1-2 hours to stretch your legs. Keep a record of your medicines and medical history with you when traveling.  Do not drive or operate heavy machinery while taking pain medicine. (narcotics).  Do not cross your legs.  Do not use any tobacco products including cigarettes, chewing tobacco, or electronic cigarettes. If you need help quitting, ask your health care provider.  Do not take baths, swim, or use a hot tub until your health care provider approves. Take showers once your health care provider approves. Pat incisions dry. Do not rub incisions with a washcloth or towel.  Avoid climbing stairs and using the handrail to pull yourself up for the first 2-3 weeks after surgery.  Return to work as directed by your health care provider.  Drink enough fluid to keep your urine clear or pale yellow.  Do not strain to have a bowel movement. Eat high-fiber foods if you become constipated. You may also take a medicine to help you have a bowel movement (laxative) as directed by your health care provider.  Resume sexual activity as directed by your health care provider. Men should not use medicines for erectile dysfunction until their doctor says it isokay.  If you had a certain type of heart condition in the past, you may need to take antibiotic  medicine before having dental work or surgery. Let your dentist and health care providers know if you had one or more of the following:  Previous endocarditis.  An artificial (prosthetic) heart valve.  Congenital heart disease. SEEK MEDICAL CARE IF:  You develop a skin rash.   You experience sudden changes in your weight.  You have a fever. SEEK IMMEDIATE MEDICAL CARE IF:   You develop chest pain that is not coming from your incision.  You have drainage (pus), redness, swelling, or pain at your incision site.   You develop shortness of breath or have difficulty breathing.   You have increased bleeding from your incision site.   You develop light-headedness.  MAKE SURE YOU:   Understand these directions.  Will watch your condition.  Will get help right away if you are not doing well or get worse. Document Released: 07/26/2004 Document Revised: 05/24/2013 Document Reviewed: 10/22/2011 [facility] Patient Information 2015 [place], [place]. This information is not intended to replace advice given to you by your health care provider. Make sure you discuss any questions you have with your health care provider.

## 2014-08-29 NOTE — Care Management Important Message (Signed)
Important Message  Patient Details  Name: Sharon Hubbard MRN: [id number] Date of Birth: 02/10/1933   Medicare Important Message Given:  Yes-second notification given    Pricilla Handler 08/29/2014, 2:40 PM

## 2014-08-29 NOTE — Progress Notes (Addendum)
[address]       [place] [address]             [phone]      7 Days Post-Op Procedure(s) (LRB): CORONARY ARTERY BYPASS GRAFTING (CABG), ON PUMP, TIMES ONE, USING RIGHT INTERNAL MAMMARY ARTERY (N/A) AORTIC VALVE REPLACEMENT (AVR) (N/A) TRANSESOPHAGEAL ECHOCARDIOGRAM (TEE) (N/A) Subjective: Feels well, no new complaints  Objective: Vital signs in last 24 hours: Temp:  [98 F (36.7 C)-98.1 F (36.7 C)] 98 F (36.7 C) (08/08 0506) Pulse Rate:  [61-66] 61 (08/08 0506) Cardiac Rhythm:  [-] Normal sinus rhythm (08/07 2100) Resp:  [18] 18 (08/08 0506) BP: (141-162)/(45-56) 162/56 mmHg (08/08 0506) SpO2:  [97 %-98 %] 98 % (08/08 0506) Weight:  [128 lb 15.5 oz (58.5 kg)] 128 lb 15.5 oz (58.5 kg) (08/08 0506)  Hemodynamic parameters for last 24 hours:    Intake/Output from previous day: 08/07 0701 - 08/08 0700 In: 460 [P.O.:460] Out: 1075 [Urine:1075] Intake/Output this shift:    General appearance: alert, cooperative and no distress Heart: regular rate and rhythm Lungs: clear to auscultation bilaterally Abdomen: benign Extremities: no edema Wound: incis healing well  Lab Results: No results for input(s): WBC, HGB, HCT, PLT in the last 72 hours. BMET:  Recent Labs  08/27/14 0445 08/29/14 0326  NA 138 136  K 4.1 4.0  CL 103 97*  CO2 28 27  GLUCOSE 161* 203*  BUN 22* 26*  CREATININE 1.23* 1.16*  CALCIUM 8.9 8.9    PT/INR: No results for input(s): LABPROT, INR in the last 72 hours. ABG    Component Value Date/Time   PHART 7.373 08/22/2014 2000   HCO3 21.2 08/22/2014 2000   TCO2 20 08/23/2014 1652   ACIDBASEDEF 4.0* 08/22/2014 2000   O2SAT 92.0 08/22/2014 2000   CBG (last 3)   Recent Labs  08/28/14 1611 08/28/14 2105 08/29/14 0614  GLUCAP 165* 236* 150*    Meds Scheduled Meds: . amLODipine  10 mg Oral Daily  . aspirin EC  325 mg Oral Daily   Or  . aspirin  324 mg Per Tube Daily  . bisacodyl  10 mg Oral Daily   Or    . bisacodyl  10 mg Rectal Daily  . clopidogrel  75 mg Oral Daily  . divalproex  500 mg Oral Daily  . docusate sodium  200 mg Oral Daily  . furosemide  40 mg Oral Daily  . glimepiride  4 mg Oral BID WC  . insulin aspart  0-15 Units Subcutaneous TID WC  . insulin aspart  3 Units Subcutaneous TID WC  . latanoprost  1 drop Both Eyes QHS  . lisinopril  10 mg Oral Daily  . metoprolol tartrate  12.5 mg Oral BID   Or  . metoprolol tartrate  12.5 mg Per Tube BID  . mirabegron ER  25 mg Oral Daily  . pantoprazole  40 mg Oral Daily  . potassium chloride  20 mEq Oral Daily  . predniSONE  4 mg Oral Q breakfast  . rosuvastatin  20 mg Oral Daily  . sodium chloride  3 mL Intravenous Q12H  . timolol  1 drop Both Eyes Daily   Continuous Infusions:  PRN Meds:.sodium chloride, alum & mag hydroxide-simeth, HYDROcodone-acetaminophen, loratadine, ondansetron (ZOFRAN) IV, sodium chloride, traMADol, zolpidem  Xrays No results found.  Assessment/Plan: S/P Procedure(s) (LRB): CORONARY ARTERY BYPASS GRAFTING (CABG), ON PUMP, TIMES ONE, USING RIGHT INTERNAL MAMMARY ARTERY (N/A) AORTIC VALVE REPLACEMENT (AVR) (  N/A) TRANSESOPHAGEAL ECHOCARDIOGRAM (TEE) (N/A) 1 conts to do well 2 will increase lisinopril for HTN, sinus rhythm 3 sugars are elevated at times, may need additional agent. HGBa1c 7.0. She is on prednisone 4 SW is working on placement - may be able to d/c today 5 on plavix and asa 6 cont gentle diuresis, may be able to d/c soon  LOS: 14 days    GOLD,WAYNE E 08/29/2014  Patient seen and examined, agree with above She is ready for SNF when bed available FL-2 signed  Remo Lipps C. Roxan Hockey, MD [facility] [phone]

## 2014-08-29 NOTE — Progress Notes (Signed)
Inpatient Diabetes Program Recommendations  AACE/ADA: New Consensus Statement on Inpatient Glycemic Control (2013)  Target Ranges:  Prepandial:   less than 140 mg/dL      Peak postprandial:   less than 180 mg/dL (1-2 hours)      Critically ill patients:  140 - 180 mg/dL   Current orders for Inpatient glycemic control: Novolog Moderate Correction, Novolog 3 units meal coverage, Amaryl 4 mg BID  Inpatient Diabetes Program Recommendations Insulin - Meal Coverage: Patient's glucose went up to 300's around meal times. Please consider increasing meal coverage to Novolog 5 units TID.   Thanks,  Tama Headings RN, MSN, [facility] Inpatient Diabetes Coordinator Team Pager [phone]

## 2014-08-29 NOTE — Clinical Social Work Note (Signed)
Patient to be d/c'ed today to [facility].  Patient and family agreeable to plans will transport via ems RN to call report.  CSW attempted to call patient's son at work and home, and he was not available.  CSW left message on patient's grandaughter Missy's voice mail to inform her of patient's discharge.  Evette Cristal, MSW, [place]

## 2014-09-23 ENCOUNTER — Other Ambulatory Visit: Payer: Self-pay | Admitting: Thoracic Surgery (Cardiothoracic Vascular Surgery)

## 2014-09-23 DIAGNOSIS — Z951 Presence of aortocoronary bypass graft: Secondary | ICD-10-CM

## 2014-09-27 ENCOUNTER — Encounter: Payer: Self-pay | Admitting: Thoracic Surgery (Cardiothoracic Vascular Surgery)

## 2014-09-27 ENCOUNTER — Ambulatory Visit
Admission: RE | Admit: 2014-09-27 | Discharge: 2014-09-27 | Disposition: A | Discharge status: Home / Self Care | Payer: Medicare Other | Source: Ambulatory Visit | Attending: Thoracic Surgery (Cardiothoracic Vascular Surgery) | Admitting: Thoracic Surgery (Cardiothoracic Vascular Surgery)

## 2014-09-27 ENCOUNTER — Ambulatory Visit (INDEPENDENT_AMBULATORY_CARE_PROVIDER_SITE_OTHER): Payer: Self-pay | Admitting: Thoracic Surgery (Cardiothoracic Vascular Surgery)

## 2014-09-27 VITALS — BP 123/62 | HR 51 | Resp 20 | Ht 63.0 in | Wt 128.0 lb

## 2014-09-27 DIAGNOSIS — Z951 Presence of aortocoronary bypass graft: Secondary | ICD-10-CM | POA: Diagnosis not present

## 2014-09-27 DIAGNOSIS — Z954 Presence of other heart-valve replacement: Secondary | ICD-10-CM

## 2014-09-27 DIAGNOSIS — Z952 Presence of prosthetic heart valve: Secondary | ICD-10-CM

## 2014-09-27 NOTE — Progress Notes (Signed)
[address]       [place], [address]             [phone]       HPI:  Sharon Hubbard returns today for a scheduled postoperative follow-up visit.  She is a 81 year old woman who underwent aortic valve replacement with a bovine pericardial valve and coronary bypass grafting 1 with a free right mammary to right coronary on 08/22/2014. Postoperatively she did well and was discharged to a SNF on postoperative day #6.  She has been feeling well. She is walking on a regular basis. She takes hydrocodone 5/325 one tablet 3 times a day for back pain. She does still have some mild sternal incisional pain. She noticed some swelling in her feet yesterday, but it resolved overnight. She has not had any anginal-type chest pain or shortness of breath. Overall she feels well. She is scheduled to go home on Thursday.  Past Medical History  Diagnosis Date  . CAD (coronary artery disease)     Interventions in the past  . Groin hematoma     April, 2011  . Hypertension   . Diabetes mellitus   . Dyslipidemia   . GERD (gastroesophageal reflux disease)   . Schatzki's ring   . Renal insufficiency   . Polymyalgia rheumatica   . Osteoarthritis     arthroscopic surgery right knee February, 2012  . Spinal stenosis     history of surgery for this  . Ejection fraction     EF 65%, echo, April, 2012, aortic valve sclerosis with very slight gradient  . Preoperative evaluation to rule out surgical contraindication     Patient needs back surgery by Dr. Carloyn Manner, May, 2012                . Pancreas cyst     The patient has multiple pancreatic cysts.  This is [facility]         . Complication of anesthesia     "felt like I was smothering once with mask on my face"  . RBBB (right bundle branch block)     old  . Bradycardia, sinus   . Myocardial infarction 2011; 08/2010  . Shortness of breath on exertion   . Blood transfusion   . Anemia   . Stroke 01/18/11    "I've had 2  TIA's"  . Cancer     ""had hysterectomy for a 6 showing cancer; think that's pretty strong  . Aortic stenosis     very mild, echo 12/2010  . Femoral bruit     01/2011 hosp, but no pseudoaneurysm or AV fistula      Current Outpatient Prescriptions  Medication Sig Dispense Refill  . amLODipine (NORVASC) 10 MG tablet Take 10 mg by mouth daily.      Marland Kitchen aspirin 81 MG tablet Take 81 mg by mouth daily.      . clopidogrel (PLAVIX) 75 MG tablet Take 1 tablet (75 mg total) by mouth daily. 90 tablet 3  . divalproex (DEPAKOTE) 500 MG 24 hr tablet Take 500 mg by mouth daily.      . furosemide (LASIX) 40 MG tablet Take one tab by mouth every morning 180 tablet 3  . glimepiride (AMARYL) 4 MG tablet Take 4 mg by mouth 2 (two) times daily.      Marland Kitchen HYDROcodone-acetaminophen (NORCO/VICODIN) 5-325 MG per tablet Take 1 tablet by mouth every 4 (four) hours as needed for severe pain.  30 tablet 0  . insulin lispro (HUMALOG KWIKPEN) 100 UNIT/ML KiwkPen Inject 0-10 Units into the skin 3 (three) times daily. Pt on sliding scale, units is based off of CBG readings.    . latanoprost (XALATAN) 0.005 % ophthalmic solution Place 1 drop into both eyes at bedtime.      Marland Kitchen lisinopril (PRINIVIL,ZESTRIL) 20 MG tablet Take 1 tablet (20 mg total) by mouth daily.    . metoprolol tartrate (LOPRESSOR) 25 MG tablet Take 0.5 tablets (12.5 mg total) by mouth 2 (two) times daily.    . mirabegron ER (MYRBETRIQ) 25 MG TB24 tablet Take 25 mg by mouth daily.    . pantoprazole (PROTONIX) 40 MG tablet Take 40 mg by mouth daily.    . potassium chloride SA (K-DUR,KLOR-CON) 20 MEQ tablet Take 20 mEq by mouth daily.    . predniSONE (DELTASONE) 1 MG tablet Take 4 mg by mouth every morning.     . rosuvastatin (CRESTOR) 20 MG tablet Take 20 mg by mouth daily.    . timolol (TIMOPTIC) 0.5 % ophthalmic solution Place 1 drop into both eyes daily.      No current facility-administered medications for this visit.    Physical Exam BP 123/62 mmHg   Pulse 51  Resp 20  Ht 5\' 3"  (1.6 m)  Wt 128 lb (58.06 kg)  BMI 22.68 kg/m2  SpO47 23% 81 year old woman in no acute distress Alert and oriented 3 with no focal deficits Cardiac regular rate and rhythm with a 2/6 systolic murmur Lungs clear with equal breath sounds bilaterally Sternum stable, incision well-healed No peripheral edema  Diagnostic Tests: I personally reviewed her chest x-ray. Shows no effusions or infiltrates.  Impression: 81 year old woman who is now 5 weeks post AVR CABG 1. She is doing extremely well. Her exercise tolerance is good. She feels well. She is not having any anginal pain or shortness of breath. Her incision is healing well.  I advised her not to lift anything over 10 pounds for another week. After that she can begin to increase her upper body activities, but should do so gradually.  She may begin driving. Appropriate precautions were discussed.  I signed her referral for cardiac rehabilitation.  Plan: Follow-up with Dr. Ron Parker.  I will be happy to see her back any time if I can be of any further assistance with her care.  Sharon Nakayama, MD [facility] [phone]

## 2014-09-30 DIAGNOSIS — I130 Hypertensive heart and chronic kidney disease with heart failure and stage 1 through stage 4 chronic kidney disease, or unspecified chronic kidney disease: Secondary | ICD-10-CM | POA: Diagnosis not present

## 2014-09-30 DIAGNOSIS — I2510 Atherosclerotic heart disease of native coronary artery without angina pectoris: Secondary | ICD-10-CM | POA: Diagnosis not present

## 2014-09-30 DIAGNOSIS — I5032 Chronic diastolic (congestive) heart failure: Secondary | ICD-10-CM | POA: Diagnosis not present

## 2014-09-30 DIAGNOSIS — M6281 Muscle weakness (generalized): Secondary | ICD-10-CM | POA: Diagnosis not present

## 2014-09-30 DIAGNOSIS — Z48812 Encounter for surgical aftercare following surgery on the circulatory system: Secondary | ICD-10-CM | POA: Diagnosis not present

## 2014-09-30 DIAGNOSIS — E1122 Type 2 diabetes mellitus with diabetic chronic kidney disease: Secondary | ICD-10-CM | POA: Diagnosis not present

## 2014-10-04 DIAGNOSIS — I5032 Chronic diastolic (congestive) heart failure: Secondary | ICD-10-CM | POA: Diagnosis not present

## 2014-10-04 DIAGNOSIS — Z48812 Encounter for surgical aftercare following surgery on the circulatory system: Secondary | ICD-10-CM | POA: Diagnosis not present

## 2014-10-04 DIAGNOSIS — I130 Hypertensive heart and chronic kidney disease with heart failure and stage 1 through stage 4 chronic kidney disease, or unspecified chronic kidney disease: Secondary | ICD-10-CM | POA: Diagnosis not present

## 2014-10-04 DIAGNOSIS — I2510 Atherosclerotic heart disease of native coronary artery without angina pectoris: Secondary | ICD-10-CM | POA: Diagnosis not present

## 2014-10-04 DIAGNOSIS — M6281 Muscle weakness (generalized): Secondary | ICD-10-CM | POA: Diagnosis not present

## 2014-10-04 DIAGNOSIS — E1122 Type 2 diabetes mellitus with diabetic chronic kidney disease: Secondary | ICD-10-CM | POA: Diagnosis not present

## 2014-10-05 DIAGNOSIS — M6281 Muscle weakness (generalized): Secondary | ICD-10-CM | POA: Diagnosis not present

## 2014-10-05 DIAGNOSIS — E1122 Type 2 diabetes mellitus with diabetic chronic kidney disease: Secondary | ICD-10-CM | POA: Diagnosis not present

## 2014-10-05 DIAGNOSIS — I130 Hypertensive heart and chronic kidney disease with heart failure and stage 1 through stage 4 chronic kidney disease, or unspecified chronic kidney disease: Secondary | ICD-10-CM | POA: Diagnosis not present

## 2014-10-05 DIAGNOSIS — Z48812 Encounter for surgical aftercare following surgery on the circulatory system: Secondary | ICD-10-CM | POA: Diagnosis not present

## 2014-10-05 DIAGNOSIS — I5032 Chronic diastolic (congestive) heart failure: Secondary | ICD-10-CM | POA: Diagnosis not present

## 2014-10-05 DIAGNOSIS — I2510 Atherosclerotic heart disease of native coronary artery without angina pectoris: Secondary | ICD-10-CM | POA: Diagnosis not present

## 2014-10-06 DIAGNOSIS — I130 Hypertensive heart and chronic kidney disease with heart failure and stage 1 through stage 4 chronic kidney disease, or unspecified chronic kidney disease: Secondary | ICD-10-CM | POA: Diagnosis not present

## 2014-10-06 DIAGNOSIS — I2510 Atherosclerotic heart disease of native coronary artery without angina pectoris: Secondary | ICD-10-CM | POA: Diagnosis not present

## 2014-10-06 DIAGNOSIS — M6281 Muscle weakness (generalized): Secondary | ICD-10-CM | POA: Diagnosis not present

## 2014-10-06 DIAGNOSIS — Z48812 Encounter for surgical aftercare following surgery on the circulatory system: Secondary | ICD-10-CM | POA: Diagnosis not present

## 2014-10-06 DIAGNOSIS — E1122 Type 2 diabetes mellitus with diabetic chronic kidney disease: Secondary | ICD-10-CM | POA: Diagnosis not present

## 2014-10-06 DIAGNOSIS — I5032 Chronic diastolic (congestive) heart failure: Secondary | ICD-10-CM | POA: Diagnosis not present

## 2014-10-07 DIAGNOSIS — E1165 Type 2 diabetes mellitus with hyperglycemia: Secondary | ICD-10-CM | POA: Diagnosis not present

## 2014-10-07 DIAGNOSIS — Z951 Presence of aortocoronary bypass graft: Secondary | ICD-10-CM | POA: Diagnosis not present

## 2014-10-07 DIAGNOSIS — E1121 Type 2 diabetes mellitus with diabetic nephropathy: Secondary | ICD-10-CM | POA: Diagnosis not present

## 2014-10-07 DIAGNOSIS — B351 Tinea unguium: Secondary | ICD-10-CM | POA: Diagnosis not present

## 2014-10-07 DIAGNOSIS — E1342 Other specified diabetes mellitus with diabetic polyneuropathy: Secondary | ICD-10-CM | POA: Diagnosis not present

## 2014-10-09 ENCOUNTER — Encounter: Payer: Self-pay | Admitting: Cardiology

## 2014-10-09 DIAGNOSIS — I739 Peripheral vascular disease, unspecified: Secondary | ICD-10-CM

## 2014-10-09 DIAGNOSIS — Z951 Presence of aortocoronary bypass graft: Secondary | ICD-10-CM | POA: Insufficient documentation

## 2014-10-09 DIAGNOSIS — I779 Disorder of arteries and arterioles, unspecified: Secondary | ICD-10-CM | POA: Insufficient documentation

## 2014-10-10 ENCOUNTER — Encounter: Payer: Self-pay | Admitting: Cardiology

## 2014-10-10 ENCOUNTER — Ambulatory Visit (INDEPENDENT_AMBULATORY_CARE_PROVIDER_SITE_OTHER): Payer: Medicare Other | Admitting: Cardiology

## 2014-10-10 VITALS — BP 128/58 | HR 52 | Ht 63.0 in | Wt 131.0 lb

## 2014-10-10 DIAGNOSIS — M6281 Muscle weakness (generalized): Secondary | ICD-10-CM | POA: Diagnosis not present

## 2014-10-10 DIAGNOSIS — Z954 Presence of other heart-valve replacement: Secondary | ICD-10-CM | POA: Diagnosis not present

## 2014-10-10 DIAGNOSIS — I25810 Atherosclerosis of coronary artery bypass graft(s) without angina pectoris: Secondary | ICD-10-CM

## 2014-10-10 DIAGNOSIS — M353 Polymyalgia rheumatica: Secondary | ICD-10-CM | POA: Diagnosis not present

## 2014-10-10 DIAGNOSIS — Z136 Encounter for screening for cardiovascular disorders: Secondary | ICD-10-CM | POA: Diagnosis not present

## 2014-10-10 DIAGNOSIS — Z952 Presence of prosthetic heart valve: Secondary | ICD-10-CM

## 2014-10-10 DIAGNOSIS — I5032 Chronic diastolic (congestive) heart failure: Secondary | ICD-10-CM | POA: Diagnosis not present

## 2014-10-10 DIAGNOSIS — I130 Hypertensive heart and chronic kidney disease with heart failure and stage 1 through stage 4 chronic kidney disease, or unspecified chronic kidney disease: Secondary | ICD-10-CM | POA: Diagnosis not present

## 2014-10-10 DIAGNOSIS — I25118 Atherosclerotic heart disease of native coronary artery with other forms of angina pectoris: Secondary | ICD-10-CM | POA: Diagnosis not present

## 2014-10-10 DIAGNOSIS — E1122 Type 2 diabetes mellitus with diabetic chronic kidney disease: Secondary | ICD-10-CM | POA: Diagnosis not present

## 2014-10-10 DIAGNOSIS — I2510 Atherosclerotic heart disease of native coronary artery without angina pectoris: Secondary | ICD-10-CM | POA: Diagnosis not present

## 2014-10-10 DIAGNOSIS — Z48812 Encounter for surgical aftercare following surgery on the circulatory system: Secondary | ICD-10-CM | POA: Diagnosis not present

## 2014-10-10 NOTE — Assessment & Plan Note (Addendum)
The patient is now quite stable post single vessel bypass surgery in August 22, 2014. She received a free RIMA. Her aortic valve was replaced at the same time. Currently she is on aspirin and Plavix. She had been on Plavix before her hospitalization because she had had some increased chest discomfort months before. I decided to continue her Plavix now. I would suggest planning to stop her Plavix 6 months after her bypass surgery.Marland Kitchen

## 2014-10-10 NOTE — Assessment & Plan Note (Signed)
The patient received a pericardial tissue valve on August 22, 2014. This was done at time of her single-vessel bypass. She is doing very well. No further workup.

## 2014-10-10 NOTE — Assessment & Plan Note (Signed)
She is on very low-dose spheroids for this problem. No change in therapy.

## 2014-10-10 NOTE — Progress Notes (Signed)
Cardiology Office Note   Date:  10/10/2014   ID:  Sharon Hubbard, DOB [birth year], MRN [id number]  PCP:  Neale Burly, MD  Cardiologist:  Dola Argyle, MD   Chief Complaint  Patient presents with  . Appointment    Follow-up post hospitalization for bypass surgery and aortic valve replacement      History of Present Illness: Sharon Hubbard is a 81 y.o. female who presents today to follow-up coronary disease and aortic valve disease status post single-vessel bypass and tissue aortic valve replacement on August 22, 2014. I saw her last in the office July 20, 2014. She was stable. However she had increased symptoms and was admitted to the hospital with a non-STEMI. Attempt was made to perform PCI of her right coronary artery. It is my understanding that the wire could not be passed. Eventually she underwent single vessel bypass with a free RIMA and aortic valve replacement with a tissue aortic prosthesis. She has recovered beautifully. She has seen Dr. Roxan Hockey for post hospital surgical evaluation. She remains on aspirin and Plavix. She had been on Plavix before the hospitalization. We will plan to stop this over time.   Past Medical History  Diagnosis Date  . CAD (coronary artery disease)     Interventions in the past  . Groin hematoma     April, 2011  . Hypertension   . Diabetes mellitus   . Dyslipidemia   . GERD (gastroesophageal reflux disease)   . Schatzki's ring   . Renal insufficiency   . Polymyalgia rheumatica   . Osteoarthritis     arthroscopic surgery right knee February, 2012  . Spinal stenosis     history of surgery for this  . Ejection fraction     EF 65%, echo, April, 2012, aortic valve sclerosis with very slight gradient  . Preoperative evaluation to rule out surgical contraindication     Patient needs back surgery by Dr. Carloyn Manner, May, 2012                . Pancreas cyst     The patient has multiple pancreatic cysts.  This is followed at [facility]          . Complication of anesthesia     "felt like I was smothering once with mask on my face"  . RBBB (right bundle branch block)     old  . Bradycardia, sinus   . Myocardial infarction 2011; 08/2010  . Shortness of breath on exertion   . Blood transfusion   . Anemia   . Stroke 01/18/11    "I've had 2 TIA's"  . Cancer     ""had hysterectomy for a 6 showing cancer; think that's pretty strong  . Aortic stenosis     very mild, echo 12/2010  . Femoral bruit     01/2011 hosp, but no pseudoaneurysm or AV fistula    Past Surgical History  Procedure Laterality Date  . Cholecystectomy  1987  . Appendectomy    . Back surgery    . Lumbar spine surgery  ~ 1977; ~ 2010    "2 hugh ruptured discs; I was paralyzed first OR; 2nd OR by Dr. Carloyn Manner, don't know what for"  . Tubal ligation  1970's  . Dilation and curettage of uterus    . Abdominal hysterectomy  ~ 1974  . Knee arthroscopy  02/2010    right knee; chrondoplasty medial femoral condyle;  Partial medial meniscectomy.   . Coronary angioplasty with  stent placement  2011    "in [place]"  . Coronary angioplasty with stent placement  08/2010; 03/25/2013  . Cardiac catheterization  01/18/11  . Femoral artery exploration Right 03/26/2013    Procedure: FEMORAL ARTERY EXPLORATION WITH SUTURE REPAIR; EVACUATION OF HEMATOMA;  Surgeon: Mal Misty, MD;  Location: [place];  Service: Vascular;  Laterality: Right;  . Coronary angiogram  01/18/2011    Procedure: CORONARY ANGIOGRAM;  Surgeon: Larey Dresser, MD;  Location: [facility] CATH LAB;  Service: Cardiovascular;;  . Left and right heart catheterization with coronary angiogram N/A 03/25/2013    Procedure: LEFT AND RIGHT HEART CATHETERIZATION WITH CORONARY ANGIOGRAM;  Surgeon: Burnell Blanks, MD;  Location: [facility] CATH LAB;  Service: Cardiovascular;  Laterality: N/A;  . Cardiac catheterization N/A 08/16/2014    Procedure: Left Heart Cath and Coronary Angiography;  Surgeon: Leonie Man, MD;  Location: [place] CV LAB;  Service: Cardiovascular;  Laterality: N/A;  . Cardiac catheterization N/A 08/16/2014    Procedure: Coronary Stent Intervention;  Surgeon: Leonie Man, MD;  Location: [place] CV LAB;  Service: Cardiovascular;  Laterality: N/A;  . Cardiac catheterization N/A 08/17/2014    Procedure: Coronary/Graft Atherectomy;  Surgeon: Burnell Blanks, MD;  Location: [place] CV LAB;  Service: Cardiovascular;  Laterality: N/A;  . Coronary artery bypass graft N/A 08/22/2014    Procedure: CORONARY ARTERY BYPASS GRAFTING (CABG), ON PUMP, TIMES ONE, USING RIGHT INTERNAL MAMMARY ARTERY;  Surgeon: Melrose Nakayama, MD;  Location: [place];  Service: Open Heart Surgery;  Laterality: N/A;  . Aortic valve replacement N/A 08/22/2014    Procedure: AORTIC VALVE REPLACEMENT (AVR);  Surgeon: Melrose Nakayama, MD;  Location: [place];  Service: Open Heart Surgery;  Laterality: N/A;  . Tee without cardioversion N/A 08/22/2014    Procedure: TRANSESOPHAGEAL ECHOCARDIOGRAM (TEE);  Surgeon: Melrose Nakayama, MD;  Location: [place];  Service: Open Heart Surgery;  Laterality: N/A;    Patient Active Problem List   Diagnosis Date Noted  . Bilateral carotid artery disease 10/09/2014  . Hx of CABG 10/09/2014  . S/P AVR 08/22/2014  . Chronic diastolic CHF (congestive heart failure) 06/13/2014  . CKD (chronic kidney disease) stage 3, GFR 30-59 ml/min 03/09/2014  . Itching 07/29/2013  . Edema 07/29/2013  . Femoral artery hematoma complicating cardiac catheterization 05/25/2013  . S/P evacuation of hematoma 04/13/2013  . Thrombocytopenia 03/29/2013  . Aortic stenosis   . Pancreas cyst   . CAD (coronary artery disease)   . RBBB (right bundle branch block)   . Hypertension   . Type 2 diabetes mellitus treated with insulin   . Hyperlipidemia   . GERD (gastroesophageal reflux disease)   . Polymyalgia rheumatica   . Bradycardia   . Ejection fraction       Current Outpatient Prescriptions    Medication Sig Dispense Refill  . amLODipine (NORVASC) 10 MG tablet Take 10 mg by mouth daily.      Marland Kitchen aspirin 81 MG tablet Take 81 mg by mouth daily.      . clopidogrel (PLAVIX) 75 MG tablet Take 1 tablet (75 mg total) by mouth daily. 90 tablet 3  . divalproex (DEPAKOTE) 500 MG 24 hr tablet Take 500 mg by mouth daily.      . furosemide (LASIX) 40 MG tablet Take one tab by mouth every morning 180 tablet 3  . HYDROcodone-acetaminophen (NORCO/VICODIN) 5-325 MG per tablet Take 1 tablet by mouth every 4 (four) hours as needed for severe  pain. 30 tablet 0  . insulin lispro (HUMALOG KWIKPEN) 100 UNIT/ML KiwkPen Inject 0-10 Units into the skin 3 (three) times daily. Pt on sliding scale, units is based off of CBG readings.    . isosorbide mononitrate (IMDUR) 60 MG 24 hr tablet Take 60 mg by mouth daily.    Marland Kitchen latanoprost (XALATAN) 0.005 % ophthalmic solution Place 1 drop into both eyes at bedtime.      Marland Kitchen lisinopril (PRINIVIL,ZESTRIL) 20 MG tablet Take 1 tablet (20 mg total) by mouth daily.    . metoprolol tartrate (LOPRESSOR) 25 MG tablet Take 0.5 tablets (12.5 mg total) by mouth 2 (two) times daily.    . mirabegron ER (MYRBETRIQ) 25 MG TB24 tablet Take 25 mg by mouth daily.    . pantoprazole (PROTONIX) 40 MG tablet Take 40 mg by mouth daily.    . potassium chloride SA (K-DUR,KLOR-CON) 20 MEQ tablet Take 20 mEq by mouth daily.    . predniSONE (DELTASONE) 1 MG tablet Take 4 mg by mouth every morning.     . rosuvastatin (CRESTOR) 20 MG tablet Take 20 mg by mouth daily.    . timolol (TIMOPTIC) 0.5 % ophthalmic solution Place 1 drop into both eyes daily.      No current facility-administered medications for this visit.    Allergies:   Clarithromycin; Codeine; Sulfa antibiotics; and Atorvastatin    Social History:  The patient  reports that she has never smoked. She has never used smokeless tobacco. She reports that she does not drink alcohol or use illicit drugs.   Family History:  The patient's  family history includes Other (age of onset: 33) in her father; Uterine cancer (age of onset: 85) in her mother.    ROS:  Please see the history of present illness.     Patient denies fever, chills, headache, sweats, rash, change in vision, change in hearing, chest pain, cough, nausea or vomiting, urinary symptoms. All other systems are reviewed and are negative.   PHYSICAL EXAM: VS:  BP 128/58 mmHg  Pulse 52  Ht 5\' 3"  (1.6 m)  Wt 131 lb (59.421 kg)  BMI 23.21 kg/m2  SpO2 99% , Patient is oriented to person time and place. Affect is normal. She is here with a close friend. Head is atraumatic. Sclera and conjunctiva are normal. There is no jugular venous distention. Lungs are clear. Respiratory effort is not labored. Her sternal wound is nicely healed. Cardiac exam reveals an S1 and S2. The abdomen is soft. There is no peripheral edema. There are no musculoskeletal deformities. There are no skin rashes.   EKG:   EKG is done today and reviewed by me. There is sinus rhythm. There is right bundle-branch block. She has had incomplete right bundle branch block in the past.   Recent Labs: 08/16/2014: ALT 12*; B Natriuretic Peptide 185.6*; TSH 0.782 08/23/2014: Magnesium 1.8 08/25/2014: Hemoglobin 9.0*; Platelets 101* 08/29/2014: BUN 26*; Creatinine, Ser 1.16*; Potassium 4.0; Sodium 136    Lipid Panel    Component Value Date/Time   CHOL 111 08/16/2014 0526   TRIG 70 08/16/2014 0526   HDL 54 08/16/2014 0526   CHOLHDL 2.1 08/16/2014 0526   VLDL 14 08/16/2014 0526   LDLCALC 43 08/16/2014 0526      Wt Readings from Last 3 Encounters:  10/10/14 131 lb (59.421 kg)  09/27/14 128 lb (58.06 kg)  08/29/14 128 lb 15.5 oz (58.5 kg)      Current medicines are reviewed  The patient understands her  medications.    ASSESSMENT AND PLAN:

## 2014-10-10 NOTE — Patient Instructions (Signed)
Continue all current medications. Follow up in  3 months 

## 2014-10-11 DIAGNOSIS — I130 Hypertensive heart and chronic kidney disease with heart failure and stage 1 through stage 4 chronic kidney disease, or unspecified chronic kidney disease: Secondary | ICD-10-CM | POA: Diagnosis not present

## 2014-10-11 DIAGNOSIS — I5032 Chronic diastolic (congestive) heart failure: Secondary | ICD-10-CM | POA: Diagnosis not present

## 2014-10-11 DIAGNOSIS — Z48812 Encounter for surgical aftercare following surgery on the circulatory system: Secondary | ICD-10-CM | POA: Diagnosis not present

## 2014-10-11 DIAGNOSIS — M6281 Muscle weakness (generalized): Secondary | ICD-10-CM | POA: Diagnosis not present

## 2014-10-11 DIAGNOSIS — E1122 Type 2 diabetes mellitus with diabetic chronic kidney disease: Secondary | ICD-10-CM | POA: Diagnosis not present

## 2014-10-11 DIAGNOSIS — I2510 Atherosclerotic heart disease of native coronary artery without angina pectoris: Secondary | ICD-10-CM | POA: Diagnosis not present

## 2014-10-12 DIAGNOSIS — E1121 Type 2 diabetes mellitus with diabetic nephropathy: Secondary | ICD-10-CM | POA: Diagnosis not present

## 2014-10-13 DIAGNOSIS — E1122 Type 2 diabetes mellitus with diabetic chronic kidney disease: Secondary | ICD-10-CM | POA: Diagnosis not present

## 2014-10-13 DIAGNOSIS — I5032 Chronic diastolic (congestive) heart failure: Secondary | ICD-10-CM | POA: Diagnosis not present

## 2014-10-13 DIAGNOSIS — I2510 Atherosclerotic heart disease of native coronary artery without angina pectoris: Secondary | ICD-10-CM | POA: Diagnosis not present

## 2014-10-13 DIAGNOSIS — I130 Hypertensive heart and chronic kidney disease with heart failure and stage 1 through stage 4 chronic kidney disease, or unspecified chronic kidney disease: Secondary | ICD-10-CM | POA: Diagnosis not present

## 2014-10-13 DIAGNOSIS — Z48812 Encounter for surgical aftercare following surgery on the circulatory system: Secondary | ICD-10-CM | POA: Diagnosis not present

## 2014-10-13 DIAGNOSIS — M6281 Muscle weakness (generalized): Secondary | ICD-10-CM | POA: Diagnosis not present

## 2014-10-14 DIAGNOSIS — I2510 Atherosclerotic heart disease of native coronary artery without angina pectoris: Secondary | ICD-10-CM | POA: Diagnosis not present

## 2014-10-14 DIAGNOSIS — Z48812 Encounter for surgical aftercare following surgery on the circulatory system: Secondary | ICD-10-CM | POA: Diagnosis not present

## 2014-10-14 DIAGNOSIS — I5032 Chronic diastolic (congestive) heart failure: Secondary | ICD-10-CM | POA: Diagnosis not present

## 2014-10-14 DIAGNOSIS — E1122 Type 2 diabetes mellitus with diabetic chronic kidney disease: Secondary | ICD-10-CM | POA: Diagnosis not present

## 2014-10-14 DIAGNOSIS — I130 Hypertensive heart and chronic kidney disease with heart failure and stage 1 through stage 4 chronic kidney disease, or unspecified chronic kidney disease: Secondary | ICD-10-CM | POA: Diagnosis not present

## 2014-10-14 DIAGNOSIS — M6281 Muscle weakness (generalized): Secondary | ICD-10-CM | POA: Diagnosis not present

## 2014-10-17 DIAGNOSIS — I5032 Chronic diastolic (congestive) heart failure: Secondary | ICD-10-CM | POA: Diagnosis not present

## 2014-10-17 DIAGNOSIS — I130 Hypertensive heart and chronic kidney disease with heart failure and stage 1 through stage 4 chronic kidney disease, or unspecified chronic kidney disease: Secondary | ICD-10-CM | POA: Diagnosis not present

## 2014-10-17 DIAGNOSIS — E1122 Type 2 diabetes mellitus with diabetic chronic kidney disease: Secondary | ICD-10-CM | POA: Diagnosis not present

## 2014-10-17 DIAGNOSIS — I2510 Atherosclerotic heart disease of native coronary artery without angina pectoris: Secondary | ICD-10-CM | POA: Diagnosis not present

## 2014-10-17 DIAGNOSIS — Z48812 Encounter for surgical aftercare following surgery on the circulatory system: Secondary | ICD-10-CM | POA: Diagnosis not present

## 2014-10-17 DIAGNOSIS — M6281 Muscle weakness (generalized): Secondary | ICD-10-CM | POA: Diagnosis not present

## 2014-10-18 DIAGNOSIS — I130 Hypertensive heart and chronic kidney disease with heart failure and stage 1 through stage 4 chronic kidney disease, or unspecified chronic kidney disease: Secondary | ICD-10-CM | POA: Diagnosis not present

## 2014-10-18 DIAGNOSIS — I2510 Atherosclerotic heart disease of native coronary artery without angina pectoris: Secondary | ICD-10-CM | POA: Diagnosis not present

## 2014-10-18 DIAGNOSIS — M6281 Muscle weakness (generalized): Secondary | ICD-10-CM | POA: Diagnosis not present

## 2014-10-18 DIAGNOSIS — I5032 Chronic diastolic (congestive) heart failure: Secondary | ICD-10-CM | POA: Diagnosis not present

## 2014-10-18 DIAGNOSIS — E1122 Type 2 diabetes mellitus with diabetic chronic kidney disease: Secondary | ICD-10-CM | POA: Diagnosis not present

## 2014-10-18 DIAGNOSIS — Z48812 Encounter for surgical aftercare following surgery on the circulatory system: Secondary | ICD-10-CM | POA: Diagnosis not present

## 2014-10-24 DIAGNOSIS — M6281 Muscle weakness (generalized): Secondary | ICD-10-CM | POA: Diagnosis not present

## 2014-10-24 DIAGNOSIS — I5032 Chronic diastolic (congestive) heart failure: Secondary | ICD-10-CM | POA: Diagnosis not present

## 2014-10-24 DIAGNOSIS — E1122 Type 2 diabetes mellitus with diabetic chronic kidney disease: Secondary | ICD-10-CM | POA: Diagnosis not present

## 2014-10-24 DIAGNOSIS — I2510 Atherosclerotic heart disease of native coronary artery without angina pectoris: Secondary | ICD-10-CM | POA: Diagnosis not present

## 2014-10-24 DIAGNOSIS — I130 Hypertensive heart and chronic kidney disease with heart failure and stage 1 through stage 4 chronic kidney disease, or unspecified chronic kidney disease: Secondary | ICD-10-CM | POA: Diagnosis not present

## 2014-10-24 DIAGNOSIS — Z48812 Encounter for surgical aftercare following surgery on the circulatory system: Secondary | ICD-10-CM | POA: Diagnosis not present

## 2014-10-25 DIAGNOSIS — I2510 Atherosclerotic heart disease of native coronary artery without angina pectoris: Secondary | ICD-10-CM | POA: Diagnosis not present

## 2014-10-25 DIAGNOSIS — I5032 Chronic diastolic (congestive) heart failure: Secondary | ICD-10-CM | POA: Diagnosis not present

## 2014-10-25 DIAGNOSIS — M6281 Muscle weakness (generalized): Secondary | ICD-10-CM | POA: Diagnosis not present

## 2014-10-25 DIAGNOSIS — E1122 Type 2 diabetes mellitus with diabetic chronic kidney disease: Secondary | ICD-10-CM | POA: Diagnosis not present

## 2014-10-25 DIAGNOSIS — Z48812 Encounter for surgical aftercare following surgery on the circulatory system: Secondary | ICD-10-CM | POA: Diagnosis not present

## 2014-10-25 DIAGNOSIS — I130 Hypertensive heart and chronic kidney disease with heart failure and stage 1 through stage 4 chronic kidney disease, or unspecified chronic kidney disease: Secondary | ICD-10-CM | POA: Diagnosis not present

## 2014-10-27 DIAGNOSIS — I2510 Atherosclerotic heart disease of native coronary artery without angina pectoris: Secondary | ICD-10-CM | POA: Diagnosis not present

## 2014-10-27 DIAGNOSIS — I130 Hypertensive heart and chronic kidney disease with heart failure and stage 1 through stage 4 chronic kidney disease, or unspecified chronic kidney disease: Secondary | ICD-10-CM | POA: Diagnosis not present

## 2014-10-27 DIAGNOSIS — E1122 Type 2 diabetes mellitus with diabetic chronic kidney disease: Secondary | ICD-10-CM | POA: Diagnosis not present

## 2014-10-27 DIAGNOSIS — I5032 Chronic diastolic (congestive) heart failure: Secondary | ICD-10-CM | POA: Diagnosis not present

## 2014-10-27 DIAGNOSIS — Z48812 Encounter for surgical aftercare following surgery on the circulatory system: Secondary | ICD-10-CM | POA: Diagnosis not present

## 2014-10-27 DIAGNOSIS — M6281 Muscle weakness (generalized): Secondary | ICD-10-CM | POA: Diagnosis not present

## 2014-10-28 DIAGNOSIS — Z48812 Encounter for surgical aftercare following surgery on the circulatory system: Secondary | ICD-10-CM | POA: Diagnosis not present

## 2014-10-28 DIAGNOSIS — I2510 Atherosclerotic heart disease of native coronary artery without angina pectoris: Secondary | ICD-10-CM | POA: Diagnosis not present

## 2014-10-28 DIAGNOSIS — M6281 Muscle weakness (generalized): Secondary | ICD-10-CM | POA: Diagnosis not present

## 2014-10-28 DIAGNOSIS — I130 Hypertensive heart and chronic kidney disease with heart failure and stage 1 through stage 4 chronic kidney disease, or unspecified chronic kidney disease: Secondary | ICD-10-CM | POA: Diagnosis not present

## 2014-10-28 DIAGNOSIS — I5032 Chronic diastolic (congestive) heart failure: Secondary | ICD-10-CM | POA: Diagnosis not present

## 2014-10-28 DIAGNOSIS — E1122 Type 2 diabetes mellitus with diabetic chronic kidney disease: Secondary | ICD-10-CM | POA: Diagnosis not present

## 2014-10-31 DIAGNOSIS — M6281 Muscle weakness (generalized): Secondary | ICD-10-CM | POA: Diagnosis not present

## 2014-10-31 DIAGNOSIS — E1122 Type 2 diabetes mellitus with diabetic chronic kidney disease: Secondary | ICD-10-CM | POA: Diagnosis not present

## 2014-10-31 DIAGNOSIS — Z48812 Encounter for surgical aftercare following surgery on the circulatory system: Secondary | ICD-10-CM | POA: Diagnosis not present

## 2014-10-31 DIAGNOSIS — I5032 Chronic diastolic (congestive) heart failure: Secondary | ICD-10-CM | POA: Diagnosis not present

## 2014-10-31 DIAGNOSIS — I130 Hypertensive heart and chronic kidney disease with heart failure and stage 1 through stage 4 chronic kidney disease, or unspecified chronic kidney disease: Secondary | ICD-10-CM | POA: Diagnosis not present

## 2014-10-31 DIAGNOSIS — I2510 Atherosclerotic heart disease of native coronary artery without angina pectoris: Secondary | ICD-10-CM | POA: Diagnosis not present

## 2014-11-01 DIAGNOSIS — I130 Hypertensive heart and chronic kidney disease with heart failure and stage 1 through stage 4 chronic kidney disease, or unspecified chronic kidney disease: Secondary | ICD-10-CM | POA: Diagnosis not present

## 2014-11-01 DIAGNOSIS — M6281 Muscle weakness (generalized): Secondary | ICD-10-CM | POA: Diagnosis not present

## 2014-11-01 DIAGNOSIS — I5032 Chronic diastolic (congestive) heart failure: Secondary | ICD-10-CM | POA: Diagnosis not present

## 2014-11-01 DIAGNOSIS — E1122 Type 2 diabetes mellitus with diabetic chronic kidney disease: Secondary | ICD-10-CM | POA: Diagnosis not present

## 2014-11-01 DIAGNOSIS — Z48812 Encounter for surgical aftercare following surgery on the circulatory system: Secondary | ICD-10-CM | POA: Diagnosis not present

## 2014-11-01 DIAGNOSIS — I2510 Atherosclerotic heart disease of native coronary artery without angina pectoris: Secondary | ICD-10-CM | POA: Diagnosis not present

## 2014-11-03 DIAGNOSIS — I2510 Atherosclerotic heart disease of native coronary artery without angina pectoris: Secondary | ICD-10-CM | POA: Diagnosis not present

## 2014-11-03 DIAGNOSIS — M6281 Muscle weakness (generalized): Secondary | ICD-10-CM | POA: Diagnosis not present

## 2014-11-03 DIAGNOSIS — I5032 Chronic diastolic (congestive) heart failure: Secondary | ICD-10-CM | POA: Diagnosis not present

## 2014-11-03 DIAGNOSIS — Z48812 Encounter for surgical aftercare following surgery on the circulatory system: Secondary | ICD-10-CM | POA: Diagnosis not present

## 2014-11-03 DIAGNOSIS — I130 Hypertensive heart and chronic kidney disease with heart failure and stage 1 through stage 4 chronic kidney disease, or unspecified chronic kidney disease: Secondary | ICD-10-CM | POA: Diagnosis not present

## 2014-11-03 DIAGNOSIS — E1122 Type 2 diabetes mellitus with diabetic chronic kidney disease: Secondary | ICD-10-CM | POA: Diagnosis not present

## 2014-11-04 DIAGNOSIS — I2510 Atherosclerotic heart disease of native coronary artery without angina pectoris: Secondary | ICD-10-CM | POA: Diagnosis not present

## 2014-11-04 DIAGNOSIS — I5032 Chronic diastolic (congestive) heart failure: Secondary | ICD-10-CM | POA: Diagnosis not present

## 2014-11-04 DIAGNOSIS — E1122 Type 2 diabetes mellitus with diabetic chronic kidney disease: Secondary | ICD-10-CM | POA: Diagnosis not present

## 2014-11-04 DIAGNOSIS — M6281 Muscle weakness (generalized): Secondary | ICD-10-CM | POA: Diagnosis not present

## 2014-11-04 DIAGNOSIS — I130 Hypertensive heart and chronic kidney disease with heart failure and stage 1 through stage 4 chronic kidney disease, or unspecified chronic kidney disease: Secondary | ICD-10-CM | POA: Diagnosis not present

## 2014-11-04 DIAGNOSIS — Z48812 Encounter for surgical aftercare following surgery on the circulatory system: Secondary | ICD-10-CM | POA: Diagnosis not present

## 2014-11-07 DIAGNOSIS — M6281 Muscle weakness (generalized): Secondary | ICD-10-CM | POA: Diagnosis not present

## 2014-11-07 DIAGNOSIS — E1122 Type 2 diabetes mellitus with diabetic chronic kidney disease: Secondary | ICD-10-CM | POA: Diagnosis not present

## 2014-11-07 DIAGNOSIS — I2510 Atherosclerotic heart disease of native coronary artery without angina pectoris: Secondary | ICD-10-CM | POA: Diagnosis not present

## 2014-11-07 DIAGNOSIS — Z48812 Encounter for surgical aftercare following surgery on the circulatory system: Secondary | ICD-10-CM | POA: Diagnosis not present

## 2014-11-07 DIAGNOSIS — I130 Hypertensive heart and chronic kidney disease with heart failure and stage 1 through stage 4 chronic kidney disease, or unspecified chronic kidney disease: Secondary | ICD-10-CM | POA: Diagnosis not present

## 2014-11-07 DIAGNOSIS — I5032 Chronic diastolic (congestive) heart failure: Secondary | ICD-10-CM | POA: Diagnosis not present

## 2014-11-09 DIAGNOSIS — I2510 Atherosclerotic heart disease of native coronary artery without angina pectoris: Secondary | ICD-10-CM | POA: Diagnosis not present

## 2014-11-09 DIAGNOSIS — Z48812 Encounter for surgical aftercare following surgery on the circulatory system: Secondary | ICD-10-CM | POA: Diagnosis not present

## 2014-11-09 DIAGNOSIS — M6281 Muscle weakness (generalized): Secondary | ICD-10-CM | POA: Diagnosis not present

## 2014-11-09 DIAGNOSIS — E1122 Type 2 diabetes mellitus with diabetic chronic kidney disease: Secondary | ICD-10-CM | POA: Diagnosis not present

## 2014-11-09 DIAGNOSIS — I130 Hypertensive heart and chronic kidney disease with heart failure and stage 1 through stage 4 chronic kidney disease, or unspecified chronic kidney disease: Secondary | ICD-10-CM | POA: Diagnosis not present

## 2014-11-09 DIAGNOSIS — I5032 Chronic diastolic (congestive) heart failure: Secondary | ICD-10-CM | POA: Diagnosis not present

## 2014-11-11 DIAGNOSIS — Z48812 Encounter for surgical aftercare following surgery on the circulatory system: Secondary | ICD-10-CM | POA: Diagnosis not present

## 2014-11-11 DIAGNOSIS — I2510 Atherosclerotic heart disease of native coronary artery without angina pectoris: Secondary | ICD-10-CM | POA: Diagnosis not present

## 2014-11-11 DIAGNOSIS — I130 Hypertensive heart and chronic kidney disease with heart failure and stage 1 through stage 4 chronic kidney disease, or unspecified chronic kidney disease: Secondary | ICD-10-CM | POA: Diagnosis not present

## 2014-11-11 DIAGNOSIS — M6281 Muscle weakness (generalized): Secondary | ICD-10-CM | POA: Diagnosis not present

## 2014-11-11 DIAGNOSIS — E1122 Type 2 diabetes mellitus with diabetic chronic kidney disease: Secondary | ICD-10-CM | POA: Diagnosis not present

## 2014-11-11 DIAGNOSIS — I5032 Chronic diastolic (congestive) heart failure: Secondary | ICD-10-CM | POA: Diagnosis not present

## 2014-11-14 DIAGNOSIS — Z48812 Encounter for surgical aftercare following surgery on the circulatory system: Secondary | ICD-10-CM | POA: Diagnosis not present

## 2014-11-14 DIAGNOSIS — E1122 Type 2 diabetes mellitus with diabetic chronic kidney disease: Secondary | ICD-10-CM | POA: Diagnosis not present

## 2014-11-14 DIAGNOSIS — M6281 Muscle weakness (generalized): Secondary | ICD-10-CM | POA: Diagnosis not present

## 2014-11-14 DIAGNOSIS — I130 Hypertensive heart and chronic kidney disease with heart failure and stage 1 through stage 4 chronic kidney disease, or unspecified chronic kidney disease: Secondary | ICD-10-CM | POA: Diagnosis not present

## 2014-11-14 DIAGNOSIS — I2510 Atherosclerotic heart disease of native coronary artery without angina pectoris: Secondary | ICD-10-CM | POA: Diagnosis not present

## 2014-11-14 DIAGNOSIS — I5032 Chronic diastolic (congestive) heart failure: Secondary | ICD-10-CM | POA: Diagnosis not present

## 2014-11-15 DIAGNOSIS — M6281 Muscle weakness (generalized): Secondary | ICD-10-CM | POA: Diagnosis not present

## 2014-11-15 DIAGNOSIS — I2510 Atherosclerotic heart disease of native coronary artery without angina pectoris: Secondary | ICD-10-CM | POA: Diagnosis not present

## 2014-11-15 DIAGNOSIS — E1122 Type 2 diabetes mellitus with diabetic chronic kidney disease: Secondary | ICD-10-CM | POA: Diagnosis not present

## 2014-11-15 DIAGNOSIS — I130 Hypertensive heart and chronic kidney disease with heart failure and stage 1 through stage 4 chronic kidney disease, or unspecified chronic kidney disease: Secondary | ICD-10-CM | POA: Diagnosis not present

## 2014-11-15 DIAGNOSIS — Z48812 Encounter for surgical aftercare following surgery on the circulatory system: Secondary | ICD-10-CM | POA: Diagnosis not present

## 2014-11-15 DIAGNOSIS — I5032 Chronic diastolic (congestive) heart failure: Secondary | ICD-10-CM | POA: Diagnosis not present

## 2014-12-09 ENCOUNTER — Encounter: Payer: Self-pay | Admitting: "Endocrinology

## 2014-12-09 ENCOUNTER — Ambulatory Visit (INDEPENDENT_AMBULATORY_CARE_PROVIDER_SITE_OTHER): Payer: Medicare Other | Admitting: "Endocrinology

## 2014-12-09 VITALS — BP 155/75 | HR 57 | Ht 63.0 in | Wt 129.0 lb

## 2014-12-09 DIAGNOSIS — I10 Essential (primary) hypertension: Secondary | ICD-10-CM

## 2014-12-09 DIAGNOSIS — IMO0002 Reserved for concepts with insufficient information to code with codable children: Secondary | ICD-10-CM

## 2014-12-09 DIAGNOSIS — E118 Type 2 diabetes mellitus with unspecified complications: Secondary | ICD-10-CM

## 2014-12-09 DIAGNOSIS — Z794 Long term (current) use of insulin: Secondary | ICD-10-CM

## 2014-12-09 DIAGNOSIS — E785 Hyperlipidemia, unspecified: Secondary | ICD-10-CM | POA: Diagnosis not present

## 2014-12-09 DIAGNOSIS — E1165 Type 2 diabetes mellitus with hyperglycemia: Secondary | ICD-10-CM | POA: Diagnosis not present

## 2014-12-09 DIAGNOSIS — I2510 Atherosclerotic heart disease of native coronary artery without angina pectoris: Secondary | ICD-10-CM

## 2014-12-09 LAB — BASIC METABOLIC PANEL
BUN: 29 mg/dL — AB (ref 7–25)
CHLORIDE: 103 mmol/L (ref 98–110)
CO2: 24 mmol/L (ref 20–31)
CREATININE: 1.45 mg/dL — AB (ref 0.60–0.93)
Calcium: 9.2 mg/dL (ref 8.6–10.4)
GLUCOSE: 184 mg/dL — AB (ref 65–99)
POTASSIUM: 3.9 mmol/L (ref 3.5–5.3)
Sodium: 139 mmol/L (ref 135–146)

## 2014-12-09 LAB — LIPID PANEL
CHOLESTEROL: 147 mg/dL (ref 125–200)
HDL: 81 mg/dL (ref >=46)
LDL Cholesterol: 39 mg/dL (ref <130)
TRIGLYCERIDES: 136 mg/dL (ref <150)
Total CHOL/HDL Ratio: 1.8 Ratio (ref <=5.0)
VLDL: 27 mg/dL (ref <30)

## 2014-12-09 LAB — HEMOGLOBIN A1C
Hgb A1c MFr Bld: 8 % — ABNORMAL HIGH (ref <5.7)
Mean Plasma Glucose: 183 mg/dL — ABNORMAL HIGH (ref <117)

## 2014-12-09 LAB — TSH: TSH: 2.448 u[IU]/mL (ref 0.350–4.500)

## 2014-12-09 LAB — T4, FREE: Free T4: 0.82 ng/dL (ref 0.80–1.80)

## 2014-12-09 NOTE — Patient Instructions (Signed)

## 2014-12-09 NOTE — Progress Notes (Signed)
Subjective:    Patient ID: Sharon Hubbard, female    DOB: 03/16/1933. Patient is being seen in consultation for management of diabetes requested by  Neale Burly, MD  Past Medical History  Diagnosis Date  . CAD (coronary artery disease)     Interventions in the past  . Groin hematoma     April, 2011  . Hypertension   . Diabetes mellitus   . Dyslipidemia   . GERD (gastroesophageal reflux disease)   . Schatzki's ring   . Renal insufficiency   . Polymyalgia rheumatica ([place])   . Osteoarthritis     arthroscopic surgery right knee February, 2012  . Spinal stenosis     history of surgery for this  . Ejection fraction     EF 65%, echo, April, 2012, aortic valve sclerosis with very slight gradient  . Preoperative evaluation to rule out surgical contraindication     Patient needs back surgery by Dr. Carloyn Manner, May, 2012                . Pancreas cyst     The patient has multiple pancreatic cysts.  This is [facility] at [facility]         . Complication of anesthesia     "felt like I was smothering once with mask on my face"  . RBBB (right bundle branch block)     old  . Bradycardia, sinus   . Myocardial infarction [facility]) 2011; 08/2010  . Shortness of breath on exertion   . Blood transfusion   . Anemia   . Stroke [facility]) 01/18/11    "I've had 2 TIA's"  . Cancer ([place])     ""had hysterectomy for a 6 showing cancer; think that's pretty strong  . Aortic stenosis     very mild, echo 12/2010  . Femoral bruit     01/2011 hosp, but no pseudoaneurysm or AV fistula   Past Surgical History  Procedure Laterality Date  . Cholecystectomy  1987  . Appendectomy    . Back surgery    . Lumbar spine surgery  ~ 1977; ~ 2010    "2 hugh ruptured discs; I was paralyzed first OR; 2nd OR by Dr. Carloyn Manner, don't know what for"  . Tubal ligation  1970's  . Dilation and curettage of uterus    . Abdominal hysterectomy  ~ 1974  . Knee arthroscopy  02/2010    right knee; chrondoplasty medial femoral condyle;   Partial medial meniscectomy.   . Coronary angioplasty with stent placement  2011    "in [place]"  . Coronary angioplasty with stent placement  08/2010; 03/25/2013  . Cardiac catheterization  01/18/11  . Femoral artery exploration Right 03/26/2013    Procedure: FEMORAL ARTERY EXPLORATION WITH SUTURE REPAIR; EVACUATION OF HEMATOMA;  Surgeon: Mal Misty, MD;  Location: [place];  Service: Vascular;  Laterality: Right;  . Coronary angiogram  01/18/2011    Procedure: CORONARY ANGIOGRAM;  Surgeon: Larey Dresser, MD;  Location: [facility] CATH LAB;  Service: Cardiovascular;;  . Left and right heart catheterization with coronary angiogram N/A 03/25/2013    Procedure: LEFT AND RIGHT HEART CATHETERIZATION WITH CORONARY ANGIOGRAM;  Surgeon: Burnell Blanks, MD;  Location: [facility] CATH LAB;  Service: Cardiovascular;  Laterality: N/A;  . Cardiac catheterization N/A 08/16/2014    Procedure: Left Heart Cath and Coronary Angiography;  Surgeon: Leonie Man, MD;  Location: [place] CV LAB;  Service: Cardiovascular;  Laterality: N/A;  . Cardiac catheterization  N/A 08/16/2014    Procedure: Coronary Stent Intervention;  Surgeon: Leonie Man, MD;  Location: [place] CV LAB;  Service: Cardiovascular;  Laterality: N/A;  . Cardiac catheterization N/A 08/17/2014    Procedure: Coronary/Graft Atherectomy;  Surgeon: Burnell Blanks, MD;  Location: [place] CV LAB;  Service: Cardiovascular;  Laterality: N/A;  . Coronary artery bypass graft N/A 08/22/2014    Procedure: CORONARY ARTERY BYPASS GRAFTING (CABG), ON PUMP, TIMES ONE, USING RIGHT INTERNAL MAMMARY ARTERY;  Surgeon: Melrose Nakayama, MD;  Location: [place];  Service: Open Heart Surgery;  Laterality: N/A;  . Aortic valve replacement N/A 08/22/2014    Procedure: AORTIC VALVE REPLACEMENT (AVR);  Surgeon: Melrose Nakayama, MD;  Location: [place];  Service: Open Heart Surgery;  Laterality: N/A;  . Tee without cardioversion N/A 08/22/2014    Procedure:  TRANSESOPHAGEAL ECHOCARDIOGRAM (TEE);  Surgeon: Melrose Nakayama, MD;  Location: [place];  Service: Open Heart Surgery;  Laterality: N/A;   Social History   Social History  . Marital Status: Married    Spouse Name: N/A  . Number of Children: N/A  . Years of Education: N/A   Social History Main Topics  . Smoking status: Never Smoker   . Smokeless tobacco: Never Used  . Alcohol Use: No  . Drug Use: No  . Sexual Activity: No   Other Topics Concern  . None   Social History Narrative   Outpatient Encounter Prescriptions as of 12/09/2014  Medication Sig  . amLODipine (NORVASC) 10 MG tablet Take 10 mg by mouth daily.    Marland Kitchen aspirin 81 MG tablet Take 81 mg by mouth daily.    . clopidogrel (PLAVIX) 75 MG tablet Take 1 tablet (75 mg total) by mouth daily.  . divalproex (DEPAKOTE) 500 MG 24 hr tablet Take 500 mg by mouth daily.    . furosemide (LASIX) 40 MG tablet Take one tab by mouth every morning  . HYDROcodone-acetaminophen (NORCO/VICODIN) 5-325 MG per tablet Take 1 tablet by mouth every 4 (four) hours as needed for severe pain.  Marland Kitchen insulin lispro (HUMALOG KWIKPEN) 100 UNIT/ML KiwkPen Inject 5 Units into the skin 3 (three) times daily before meals.  . isosorbide mononitrate (IMDUR) 60 MG 24 hr tablet Take 60 mg by mouth daily.  Marland Kitchen latanoprost (XALATAN) 0.005 % ophthalmic solution Place 1 drop into both eyes at bedtime.    Marland Kitchen lisinopril (PRINIVIL,ZESTRIL) 20 MG tablet Take 1 tablet (20 mg total) by mouth daily.  . metoprolol tartrate (LOPRESSOR) 25 MG tablet Take 0.5 tablets (12.5 mg total) by mouth 2 (two) times daily.  . mirabegron ER (MYRBETRIQ) 25 MG TB24 tablet Take 25 mg by mouth daily.  . pantoprazole (PROTONIX) 40 MG tablet Take 40 mg by mouth daily.  . potassium chloride SA (K-DUR,KLOR-CON) 20 MEQ tablet Take 20 mEq by mouth daily.  . predniSONE (DELTASONE) 1 MG tablet Take 4 mg by mouth every morning.   . rosuvastatin (CRESTOR) 20 MG tablet Take 20 mg by mouth daily.  . timolol  (TIMOPTIC) 0.5 % ophthalmic solution Place 1 drop into both eyes daily.    No facility-administered encounter medications on file as of 12/09/2014.   ALLERGIES: Allergies  Allergen Reactions  . Clarithromycin     REACTION: Vomiting and diarrhea.  . Codeine   . Sulfa Antibiotics   . Atorvastatin Other (See Comments)    Made her knees buckle and muscles ache   VACCINATION STATUS:  There is no immunization history on file for  this patient.  Diabetes She presents for her initial diabetic visit. She has type 2 diabetes mellitus. Onset time: She was diagnosed at approximate age of 28 years. Her disease course has been worsening. There are no hypoglycemic associated symptoms. Pertinent negatives for hypoglycemia include no confusion, headaches, pallor or seizures. Associated symptoms include fatigue, polydipsia and polyuria. Pertinent negatives for diabetes include no chest pain and no polyphagia. There are no hypoglycemic complications. Symptoms are worsening. Diabetic complications include heart disease. Risk factors for coronary artery disease include diabetes mellitus, dyslipidemia, hypertension and sedentary lifestyle. Current diabetic treatment includes insulin injections. She is compliant with treatment most of the time. Her weight is stable. She is following a generally unhealthy diet. She has not had a previous visit with a dietitian. She never participates in exercise. Her home blood glucose trend is fluctuating dramatically. Her overall blood glucose range is 180-200 mg/dl. An ACE inhibitor/angiotensin II receptor blocker is being taken.  Hyperlipidemia This is a chronic problem. The current episode started more than 1 year ago. Exacerbating diseases include diabetes. Associated symptoms include leg pain and myalgias. Pertinent negatives include no chest pain or shortness of breath. (She has poorly myalgia rheumatica with diffuse body pain. ) Current antihyperlipidemic treatment includes  statins.  Hypertension This is a chronic problem. The current episode started more than 1 year ago. The problem is uncontrolled. Pertinent negatives include no chest pain, headaches, palpitations or shortness of breath. Risk factors for coronary artery disease include dyslipidemia and sedentary lifestyle. Past treatments include ACE inhibitors, diuretics and calcium channel blockers. Hypertensive end-organ damage includes CAD/MI.    Review of Systems  Constitutional: Positive for fatigue. Negative for unexpected weight change.  HENT: Negative for trouble swallowing and voice change.   Eyes: Negative for visual disturbance.  Respiratory: Negative for cough, shortness of breath and wheezing.   Cardiovascular: Negative for chest pain, palpitations and leg swelling.  Gastrointestinal: Negative for nausea, vomiting and diarrhea.  Endocrine: Positive for polydipsia and polyuria. Negative for cold intolerance, heat intolerance and polyphagia.  Musculoskeletal: Positive for myalgias. Negative for arthralgias.  Skin: Negative for color change, pallor, rash and wound.  Neurological: Negative for seizures and headaches.  Psychiatric/Behavioral: Negative for suicidal ideas and confusion.    Objective:    BP 155/75 mmHg  Pulse 57  Ht 5\' 3"  (1.6 m)  Wt 129 lb (58.514 kg)  BMI 22.86 kg/m2  SpO2 98%  Wt Readings from Last 3 Encounters:  12/09/14 129 lb (58.514 kg)  10/10/14 131 lb (59.421 kg)  09/27/14 128 lb (58.06 kg)    Physical Exam  Constitutional: She is oriented to person, place, and time. She appears well-developed.  HENT:  Head: Normocephalic and atraumatic.  Eyes: EOM are normal.  Neck: Normal range of motion. Neck supple. No tracheal deviation present. No thyromegaly present.  Cardiovascular: Normal rate and regular rhythm.   Pulmonary/Chest: Effort normal and breath sounds normal.  Abdominal: Soft. Bowel sounds are normal. There is no tenderness. There is no guarding.   Musculoskeletal: Normal range of motion. She exhibits no edema.  She has diffuse tenderness on all extremities and along her spine. She explains this is due to her  polymyalgia rheumatica.  Neurological: She is alert and oriented to person, place, and time. She has normal reflexes. No cranial nerve deficit. Coordination normal.  Skin: Skin is warm and dry. No rash noted. No erythema. No pallor.  Psychiatric: She has a normal mood and affect. Judgment normal.    Complete Blood  Count (Most recent): Lab Results  Component Value Date   WBC 9.1 08/25/2014   HGB 9.0* 08/25/2014   HCT 26.5* 08/25/2014   MCV 89.2 08/25/2014   PLT 101* 08/25/2014   Chemistry (most recent): Lab Results  Component Value Date   NA 136 08/29/2014   K 4.0 08/29/2014   CL 97* 08/29/2014   CO2 27 08/29/2014   BUN 26* 08/29/2014   CREATININE 1.16* 08/29/2014   Diabetic Labs (most recent): Lab Results  Component Value Date   HGBA1C 7.0* 08/24/2014   HGBA1C 9.1* 01/18/2011   Lipid profile (most recent): Lab Results  Component Value Date   TRIG 70 08/16/2014   CHOL 111 08/16/2014       Assessment & Plan:   1. Uncontrolled type 2 diabetes mellitus with complication, with long-term current use of insulin ([place])  - Patient has currently uncontrolled symptomatic type 2 DM since  81 years of age,  with most recent A1c of 7 %.  -She has no recent labs to review.  -Her  diabetes is complicated by coronary artery disease which required several stent Placement and aortic valve replacement and patient remains at a high risk for more acute and chronic complications of diabetes which include CAD, CVA, CKD, retinopathy, and neuropathy. These are all discussed in detail with the patient.  - I have counseled the patient on diet management and weight loss, by adopting a carbohydrate restricted/protein rich diet.  - Suggestion is made for patient to avoid simple carbohydrates   from their diet including Cakes ,  Desserts, Ice Cream,  Soda (  diet and regular) , Sweet Tea , Candies,  Chips, Cookies, Artificial Sweeteners,   and "Sugar-free" Products . This will help patient to have stable blood glucose profile and potentially avoid unintended weight gain.  - I encouraged the patient to switch to  unprocessed or minimally processed complex starch and increased protein intake (animal or plant source), fruits, and vegetables.  - Patient is advised to stick to a routine mealtimes to eat 3 meals  a day and avoid unnecessary snacks ( to snack only to correct hypoglycemia).  - The patient will be scheduled with Jearld Fenton, RDN, CDE for individualized DM education.  - I have approached patient with the following individualized plan to manage diabetes and patient agrees:   - She reports intolerance to metformin, glimepiride was discontinued due to severe fluctuation in her glucose profile. She is on ongoing steroid therapy due to her polymyalgia rheumatica. -She may require basal bolus insulin to control diabetes to target. -I have requested her to monitor blood glucose before meals and at bedtime for one week and return with her meter and logs. -In the meantime I advised her to continue Humalog 5 units 3 times a day before meals for pre-meal glucose above 90 mg/dL. -I will send her to lab to get fresh set of labs including A1c, renal function, lipid panel, and thyroid function test. - Patient specific target  A1c;  LDL, HDL, Triglycerides, and  Waist Circumference were discussed in detail.  2) BP/HTN: Uncontrolled. Continue current medications including ACEI/ARB. 3) Lipids/HPL:  Controlled unknown, will obtain lipid panel this morning, I advised her to continue statins. 4)  Weight/Diet: CDE Consult will be initiated , exercise, and detailed carbohydrates information provided.  5) Chronic Care/Health Maintenance:  -Patient is on ACEI/ARB and Statin medications and encouraged to continue to follow up with  Ophthalmology, Podiatrist at least yearly or according to recommendations, and advised  to   stay away from smoking. I have recommended yearly flu vaccine and pneumonia vaccination at least every 5 years; moderate intensity exercise for up to 150 minutes weekly; and  sleep for at least 7 hours a day.  - 45 minutes of time was spent on the care of this patient , 50% of which was applied for counseling on diabetes complications and their preventions.  - Patient to bring meter and  blood glucose logs during their next visit.   - I advised patient to maintain close follow up with their PCP for primary care needs.  Follow up plan: - Return in about 1 week (around 12/16/2014) for diabetes, high blood pressure, high cholesterol, follow up with pre-visit labs, meter, and logs, labs today.  Glade Lloyd, MD Phone: [phone]  Fax: [phone]   12/09/2014, 12:40 PM

## 2014-12-26 ENCOUNTER — Ambulatory Visit: Payer: Medicare Other | Admitting: "Endocrinology

## 2015-01-06 DIAGNOSIS — E1121 Type 2 diabetes mellitus with diabetic nephropathy: Secondary | ICD-10-CM | POA: Diagnosis not present

## 2015-01-06 DIAGNOSIS — E1342 Other specified diabetes mellitus with diabetic polyneuropathy: Secondary | ICD-10-CM | POA: Diagnosis not present

## 2015-01-06 DIAGNOSIS — B351 Tinea unguium: Secondary | ICD-10-CM | POA: Diagnosis not present

## 2015-01-06 DIAGNOSIS — Z951 Presence of aortocoronary bypass graft: Secondary | ICD-10-CM | POA: Diagnosis not present

## 2015-01-06 DIAGNOSIS — F330 Major depressive disorder, recurrent, mild: Secondary | ICD-10-CM | POA: Diagnosis not present

## 2015-01-10 ENCOUNTER — Ambulatory Visit (INDEPENDENT_AMBULATORY_CARE_PROVIDER_SITE_OTHER): Payer: Medicare Other | Admitting: Cardiology

## 2015-01-10 ENCOUNTER — Encounter: Payer: Self-pay | Admitting: Cardiology

## 2015-01-10 VITALS — BP 118/65 | HR 55 | Ht 63.0 in | Wt 128.6 lb

## 2015-01-10 DIAGNOSIS — I2510 Atherosclerotic heart disease of native coronary artery without angina pectoris: Secondary | ICD-10-CM

## 2015-01-10 DIAGNOSIS — Z954 Presence of other heart-valve replacement: Secondary | ICD-10-CM | POA: Diagnosis not present

## 2015-01-10 DIAGNOSIS — Z952 Presence of prosthetic heart valve: Secondary | ICD-10-CM

## 2015-01-10 DIAGNOSIS — I25810 Atherosclerosis of coronary artery bypass graft(s) without angina pectoris: Secondary | ICD-10-CM | POA: Diagnosis not present

## 2015-01-10 NOTE — Progress Notes (Signed)
Patient ID: Sharon Hubbard, female   DOB: 1933/12/06, 81 y.o.   MRN: [id number]     Clinical Summary Sharon Hubbard is a 81 y.o.female former patient of Dr Ron Parker, this is our first visit together. She is seen for the following medical problems.   1. CAD - history of single vessel CABG 08/2014 (RIMA to RCA). In 2012 hx of DES to mid LAD. In 03/2013 history of additional DES to LAD.  - 07/2014 echo LVEF 0000000, grade I diastolic dysfunction  - denies any chest pain, no SOB  2. Aortic valve stenosis - history of AVR Aug 2016 for aortic stenosis, she received a 21 mm [facility] ease pericardial tissue valve. - no SOB/DOE, no LE edema    SH: multiple family members dealing with cancer including her son and great grandson. Her husband had a prior stroke, he is fairly dependent on her at home.  Past Medical History  Diagnosis Date  . CAD (coronary artery disease)     Interventions in the past  . Groin hematoma     April, 2011  . Hypertension   . Diabetes mellitus   . Dyslipidemia   . GERD (gastroesophageal reflux disease)   . Schatzki's ring   . Renal insufficiency   . Polymyalgia rheumatica ([place])   . Osteoarthritis     arthroscopic surgery right knee February, 2012  . Spinal stenosis     history of surgery for this  . Ejection fraction     EF 65%, echo, April, 2012, aortic valve sclerosis with very slight gradient  . Preoperative evaluation to rule out surgical contraindication     Patient needs back surgery by Dr. Carloyn Manner, May, 2012                . Pancreas cyst     The patient has multiple pancreatic cysts.  This is followed at [facility]         . Complication of anesthesia     "felt like I was smothering once with mask on my face"  . RBBB (right bundle Micahel Omlor block)     old  . Bradycardia, sinus   . Myocardial infarction [facility]) 2011; 08/2010  . Shortness of breath on exertion   . Blood transfusion   . Anemia   . Stroke [facility]) 01/18/11    "I've had 2 TIA's"  . Cancer ([place])      ""had hysterectomy for a 6 showing cancer; think that's pretty strong  . Aortic stenosis     very mild, echo 12/2010  . Femoral bruit     01/2011 hosp, but no pseudoaneurysm or AV fistula     Allergies  Allergen Reactions  . Clarithromycin     REACTION: Vomiting and diarrhea.  . Codeine   . Sulfa Antibiotics   . Atorvastatin Other (See Comments)    Made her knees buckle and muscles ache     Current Outpatient Prescriptions  Medication Sig Dispense Refill  . amLODipine (NORVASC) 10 MG tablet Take 10 mg by mouth daily.      Marland Kitchen aspirin 81 MG tablet Take 81 mg by mouth daily.      . clopidogrel (PLAVIX) 75 MG tablet Take 1 tablet (75 mg total) by mouth daily. 90 tablet 3  . divalproex (DEPAKOTE) 500 MG 24 hr tablet Take 500 mg by mouth daily.      . furosemide (LASIX) 40 MG tablet Take one tab by mouth every morning 180 tablet 3  . HYDROcodone-acetaminophen (  NORCO/VICODIN) 5-325 MG per tablet Take 1 tablet by mouth every 4 (four) hours as needed for severe pain. 30 tablet 0  . insulin lispro (HUMALOG KWIKPEN) 100 UNIT/ML KiwkPen Inject 5 Units into the skin 3 (three) times daily before meals.    . isosorbide mononitrate (IMDUR) 60 MG 24 hr tablet Take 60 mg by mouth daily.    Marland Kitchen latanoprost (XALATAN) 0.005 % ophthalmic solution Place 1 drop into both eyes at bedtime.      Marland Kitchen lisinopril (PRINIVIL,ZESTRIL) 20 MG tablet Take 1 tablet (20 mg total) by mouth daily.    . metoprolol tartrate (LOPRESSOR) 25 MG tablet Take 0.5 tablets (12.5 mg total) by mouth 2 (two) times daily.    . mirabegron ER (MYRBETRIQ) 25 MG TB24 tablet Take 25 mg by mouth daily.    . pantoprazole (PROTONIX) 40 MG tablet Take 40 mg by mouth daily.    . potassium chloride SA (K-DUR,KLOR-CON) 20 MEQ tablet Take 20 mEq by mouth daily.    . predniSONE (DELTASONE) 1 MG tablet Take 4 mg by mouth every morning.     . rosuvastatin (CRESTOR) 20 MG tablet Take 20 mg by mouth daily.    . timolol (TIMOPTIC) 0.5 % ophthalmic  solution Place 1 drop into both eyes daily.      No current facility-administered medications for this visit.     Past Surgical History  Procedure Laterality Date  . Cholecystectomy  1987  . Appendectomy    . Back surgery    . Lumbar spine surgery  ~ 1977; ~ 2010    "2 hugh ruptured discs; I was paralyzed first OR; 2nd OR by Dr. Carloyn Manner, don't know what for"  . Tubal ligation  1970's  . Dilation and curettage of uterus    . Abdominal hysterectomy  ~ 1974  . Knee arthroscopy  02/2010    right knee; chrondoplasty medial femoral condyle;  Partial medial meniscectomy.   . Coronary angioplasty with stent placement  2011    "in [place]"  . Coronary angioplasty with stent placement  08/2010; 03/25/2013  . Cardiac catheterization  01/18/11  . Femoral artery exploration Right 03/26/2013    Procedure: FEMORAL ARTERY EXPLORATION WITH SUTURE REPAIR; EVACUATION OF HEMATOMA;  Surgeon: Mal Misty, MD;  Location: [place];  Service: Vascular;  Laterality: Right;  . Coronary angiogram  01/18/2011    Procedure: CORONARY ANGIOGRAM;  Surgeon: Larey Dresser, MD;  Location: [facility] CATH LAB;  Service: Cardiovascular;;  . Left and right heart catheterization with coronary angiogram N/A 03/25/2013    Procedure: LEFT AND RIGHT HEART CATHETERIZATION WITH CORONARY ANGIOGRAM;  Surgeon: Burnell Blanks, MD;  Location: [facility] CATH LAB;  Service: Cardiovascular;  Laterality: N/A;  . Cardiac catheterization N/A 08/16/2014    Procedure: Left Heart Cath and Coronary Angiography;  Surgeon: Leonie Man, MD;  Location: [place] CV LAB;  Service: Cardiovascular;  Laterality: N/A;  . Cardiac catheterization N/A 08/16/2014    Procedure: Coronary Stent Intervention;  Surgeon: Leonie Man, MD;  Location: [place] CV LAB;  Service: Cardiovascular;  Laterality: N/A;  . Cardiac catheterization N/A 08/17/2014    Procedure: Coronary/Graft Atherectomy;  Surgeon: Burnell Blanks, MD;  Location: [place] CV LAB;  Service:  Cardiovascular;  Laterality: N/A;  . Coronary artery bypass graft N/A 08/22/2014    Procedure: CORONARY ARTERY BYPASS GRAFTING (CABG), ON PUMP, TIMES ONE, USING RIGHT INTERNAL MAMMARY ARTERY;  Surgeon: Melrose Nakayama, MD;  Location: [place];  Service: Open  Heart Surgery;  Laterality: N/A;  . Aortic valve replacement N/A 08/22/2014    Procedure: AORTIC VALVE REPLACEMENT (AVR);  Surgeon: Melrose Nakayama, MD;  Location: [place];  Service: Open Heart Surgery;  Laterality: N/A;  . Tee without cardioversion N/A 08/22/2014    Procedure: TRANSESOPHAGEAL ECHOCARDIOGRAM (TEE);  Surgeon: Melrose Nakayama, MD;  Location: [place];  Service: Open Heart Surgery;  Laterality: N/A;     Allergies  Allergen Reactions  . Clarithromycin     REACTION: Vomiting and diarrhea.  . Codeine   . Sulfa Antibiotics   . Atorvastatin Other (See Comments)    Made her knees buckle and muscles ache      Family History  Problem Relation Age of Onset  . Uterine cancer Mother 43  . Other Father 7    cervical fracture     Social History Ms. Decena reports that she has never smoked. She has never used smokeless tobacco. Ms. Vanleeuwen reports that she does not drink alcohol.   Review of Systems CONSTITUTIONAL: No weight loss, fever, chills, weakness or fatigue.  HEENT: Eyes: No visual loss, blurred vision, double vision or yellow sclerae.No hearing loss, sneezing, congestion, runny nose or sore throat.  SKIN: No rash or itching.  CARDIOVASCULAR: per hpi RESPIRATORY: No shortness of breath, cough or sputum.  GASTROINTESTINAL: No anorexia, nausea, vomiting or diarrhea. No abdominal pain or blood.  GENITOURINARY: No burning on urination, no polyuria NEUROLOGICAL: No headache, dizziness, syncope, paralysis, ataxia, numbness or tingling in the extremities. No change in bowel or bladder control.  MUSCULOSKELETAL: No muscle, back pain, joint pain or stiffness.  LYMPHATICS: No enlarged nodes. No history of  splenectomy.  PSYCHIATRIC: No history of depression or anxiety.  ENDOCRINOLOGIC: No reports of sweating, cold or heat intolerance. No polyuria or polydipsia.  Marland Kitchen   Physical Examination Filed Vitals:   01/10/15 1525  BP: 118/65  Pulse: 55   Filed Vitals:   01/10/15 1525  Height: 5\' 3"  (1.6 m)  Weight: 128 lb 9.6 oz (58.333 kg)    Gen: resting comfortably, no acute distress HEENT: no scleral icterus, pupils equal round and reactive, no palptable cervical adenopathy,  CV: RRR, no m/r/g, no no jvd Resp: Clear to auscultation bilaterally GI: abdomen is soft, non-tender, non-distended, normal bowel sounds, no hepatosplenomegaly MSK: extremities are warm, no edema.  Skin: warm, no rash Neuro:  no focal deficits Psych: appropriate affect     Assessment and Plan   1. CAD - no current symptoms - continue current meds - refer to cardiac rehab  2. Aortic valve disease - s/p recent replacement, doing well - refer to cardiac rehab     Arnoldo Lenis, M.D.

## 2015-01-10 NOTE — Patient Instructions (Signed)
Your physician wants you to follow-up in: 6 months with Dr. Bryna Colander will receive a reminder letter in the mail two months in advance. If you don't receive a letter, please call our office to schedule the follow-up appointment.  Your physician recommends that you continue on your current medications as directed. Please refer to the Current Medication list given to you today.  You have been referred to CARDIAC REHAB  Thank you for choosing [facility]!!

## 2015-01-19 DIAGNOSIS — Z7902 Long term (current) use of antithrombotics/antiplatelets: Secondary | ICD-10-CM | POA: Diagnosis not present

## 2015-01-19 DIAGNOSIS — Z955 Presence of coronary angioplasty implant and graft: Secondary | ICD-10-CM | POA: Diagnosis not present

## 2015-01-19 DIAGNOSIS — Z79899 Other long term (current) drug therapy: Secondary | ICD-10-CM | POA: Diagnosis not present

## 2015-01-19 DIAGNOSIS — S51811A Laceration without foreign body of right forearm, initial encounter: Secondary | ICD-10-CM | POA: Diagnosis not present

## 2015-01-19 DIAGNOSIS — M542 Cervicalgia: Secondary | ICD-10-CM | POA: Diagnosis not present

## 2015-01-19 DIAGNOSIS — R55 Syncope and collapse: Secondary | ICD-10-CM | POA: Diagnosis not present

## 2015-01-19 DIAGNOSIS — I10 Essential (primary) hypertension: Secondary | ICD-10-CM | POA: Diagnosis not present

## 2015-01-19 DIAGNOSIS — N390 Urinary tract infection, site not specified: Secondary | ICD-10-CM | POA: Diagnosis not present

## 2015-01-19 DIAGNOSIS — S0003XA Contusion of scalp, initial encounter: Secondary | ICD-10-CM | POA: Diagnosis not present

## 2015-01-19 DIAGNOSIS — S0180XA Unspecified open wound of other part of head, initial encounter: Secondary | ICD-10-CM | POA: Diagnosis not present

## 2015-01-19 DIAGNOSIS — Z952 Presence of prosthetic heart valve: Secondary | ICD-10-CM | POA: Diagnosis not present

## 2015-01-19 DIAGNOSIS — F319 Bipolar disorder, unspecified: Secondary | ICD-10-CM | POA: Diagnosis not present

## 2015-01-19 DIAGNOSIS — Z794 Long term (current) use of insulin: Secondary | ICD-10-CM | POA: Diagnosis not present

## 2015-01-19 DIAGNOSIS — I2510 Atherosclerotic heart disease of native coronary artery without angina pectoris: Secondary | ICD-10-CM | POA: Diagnosis not present

## 2015-01-19 DIAGNOSIS — Z7982 Long term (current) use of aspirin: Secondary | ICD-10-CM | POA: Diagnosis not present

## 2015-01-19 DIAGNOSIS — Z951 Presence of aortocoronary bypass graft: Secondary | ICD-10-CM | POA: Diagnosis not present

## 2015-01-19 DIAGNOSIS — Z23 Encounter for immunization: Secondary | ICD-10-CM | POA: Diagnosis not present

## 2015-01-19 DIAGNOSIS — S199XXA Unspecified injury of neck, initial encounter: Secondary | ICD-10-CM | POA: Diagnosis not present

## 2015-01-19 DIAGNOSIS — S022XXA Fracture of nasal bones, initial encounter for closed fracture: Secondary | ICD-10-CM | POA: Diagnosis not present

## 2015-01-19 DIAGNOSIS — M25461 Effusion, right knee: Secondary | ICD-10-CM | POA: Diagnosis not present

## 2015-01-19 DIAGNOSIS — T07 Unspecified multiple injuries: Secondary | ICD-10-CM | POA: Diagnosis not present

## 2015-01-19 DIAGNOSIS — S0181XA Laceration without foreign body of other part of head, initial encounter: Secondary | ICD-10-CM | POA: Diagnosis not present

## 2015-01-19 DIAGNOSIS — R32 Unspecified urinary incontinence: Secondary | ICD-10-CM | POA: Diagnosis not present

## 2015-01-19 DIAGNOSIS — S8001XA Contusion of right knee, initial encounter: Secondary | ICD-10-CM | POA: Diagnosis not present

## 2015-01-19 DIAGNOSIS — E119 Type 2 diabetes mellitus without complications: Secondary | ICD-10-CM | POA: Diagnosis not present

## 2015-01-19 DIAGNOSIS — R51 Headache: Secondary | ICD-10-CM | POA: Diagnosis not present

## 2015-01-19 DIAGNOSIS — S0993XA Unspecified injury of face, initial encounter: Secondary | ICD-10-CM | POA: Diagnosis not present

## 2015-01-19 DIAGNOSIS — S0083XA Contusion of other part of head, initial encounter: Secondary | ICD-10-CM | POA: Diagnosis not present

## 2015-01-19 DIAGNOSIS — K219 Gastro-esophageal reflux disease without esophagitis: Secondary | ICD-10-CM | POA: Diagnosis not present

## 2015-01-19 DIAGNOSIS — W06XXXA Fall from bed, initial encounter: Secondary | ICD-10-CM | POA: Diagnosis not present

## 2015-01-19 DIAGNOSIS — S01112A Laceration without foreign body of left eyelid and periocular area, initial encounter: Secondary | ICD-10-CM | POA: Diagnosis not present

## 2015-01-20 DIAGNOSIS — S0083XA Contusion of other part of head, initial encounter: Secondary | ICD-10-CM | POA: Diagnosis not present

## 2015-01-20 DIAGNOSIS — S01112A Laceration without foreign body of left eyelid and periocular area, initial encounter: Secondary | ICD-10-CM | POA: Diagnosis not present

## 2015-01-20 DIAGNOSIS — R55 Syncope and collapse: Secondary | ICD-10-CM | POA: Diagnosis not present

## 2015-01-20 DIAGNOSIS — W06XXXA Fall from bed, initial encounter: Secondary | ICD-10-CM | POA: Diagnosis not present

## 2015-01-20 DIAGNOSIS — S51811A Laceration without foreign body of right forearm, initial encounter: Secondary | ICD-10-CM | POA: Diagnosis not present

## 2015-01-24 DIAGNOSIS — M6281 Muscle weakness (generalized): Secondary | ICD-10-CM | POA: Diagnosis not present

## 2015-01-24 DIAGNOSIS — R296 Repeated falls: Secondary | ICD-10-CM | POA: Diagnosis not present

## 2015-01-24 DIAGNOSIS — S0093XS Contusion of unspecified part of head, sequela: Secondary | ICD-10-CM | POA: Diagnosis not present

## 2015-01-24 DIAGNOSIS — E119 Type 2 diabetes mellitus without complications: Secondary | ICD-10-CM | POA: Diagnosis not present

## 2015-01-24 DIAGNOSIS — N390 Urinary tract infection, site not specified: Secondary | ICD-10-CM | POA: Diagnosis not present

## 2015-01-26 DIAGNOSIS — R55 Syncope and collapse: Secondary | ICD-10-CM | POA: Diagnosis not present

## 2015-01-27 DIAGNOSIS — N390 Urinary tract infection, site not specified: Secondary | ICD-10-CM | POA: Diagnosis not present

## 2015-01-27 DIAGNOSIS — M6281 Muscle weakness (generalized): Secondary | ICD-10-CM | POA: Diagnosis not present

## 2015-01-27 DIAGNOSIS — R296 Repeated falls: Secondary | ICD-10-CM | POA: Diagnosis not present

## 2015-01-27 DIAGNOSIS — E119 Type 2 diabetes mellitus without complications: Secondary | ICD-10-CM | POA: Diagnosis not present

## 2015-01-27 DIAGNOSIS — S0093XS Contusion of unspecified part of head, sequela: Secondary | ICD-10-CM | POA: Diagnosis not present

## 2015-01-30 DIAGNOSIS — R296 Repeated falls: Secondary | ICD-10-CM | POA: Diagnosis not present

## 2015-01-30 DIAGNOSIS — M6281 Muscle weakness (generalized): Secondary | ICD-10-CM | POA: Diagnosis not present

## 2015-01-30 DIAGNOSIS — S0093XS Contusion of unspecified part of head, sequela: Secondary | ICD-10-CM | POA: Diagnosis not present

## 2015-01-30 DIAGNOSIS — E119 Type 2 diabetes mellitus without complications: Secondary | ICD-10-CM | POA: Diagnosis not present

## 2015-01-30 DIAGNOSIS — N390 Urinary tract infection, site not specified: Secondary | ICD-10-CM | POA: Diagnosis not present

## 2015-01-31 DIAGNOSIS — S0093XS Contusion of unspecified part of head, sequela: Secondary | ICD-10-CM | POA: Diagnosis not present

## 2015-01-31 DIAGNOSIS — R296 Repeated falls: Secondary | ICD-10-CM | POA: Diagnosis not present

## 2015-01-31 DIAGNOSIS — M6281 Muscle weakness (generalized): Secondary | ICD-10-CM | POA: Diagnosis not present

## 2015-01-31 DIAGNOSIS — N390 Urinary tract infection, site not specified: Secondary | ICD-10-CM | POA: Diagnosis not present

## 2015-01-31 DIAGNOSIS — E119 Type 2 diabetes mellitus without complications: Secondary | ICD-10-CM | POA: Diagnosis not present

## 2015-02-02 DIAGNOSIS — R296 Repeated falls: Secondary | ICD-10-CM | POA: Diagnosis not present

## 2015-02-02 DIAGNOSIS — N390 Urinary tract infection, site not specified: Secondary | ICD-10-CM | POA: Diagnosis not present

## 2015-02-02 DIAGNOSIS — E119 Type 2 diabetes mellitus without complications: Secondary | ICD-10-CM | POA: Diagnosis not present

## 2015-02-02 DIAGNOSIS — M6281 Muscle weakness (generalized): Secondary | ICD-10-CM | POA: Diagnosis not present

## 2015-02-02 DIAGNOSIS — S0093XS Contusion of unspecified part of head, sequela: Secondary | ICD-10-CM | POA: Diagnosis not present

## 2015-02-06 DIAGNOSIS — S0093XS Contusion of unspecified part of head, sequela: Secondary | ICD-10-CM | POA: Diagnosis not present

## 2015-02-06 DIAGNOSIS — E119 Type 2 diabetes mellitus without complications: Secondary | ICD-10-CM | POA: Diagnosis not present

## 2015-02-06 DIAGNOSIS — R296 Repeated falls: Secondary | ICD-10-CM | POA: Diagnosis not present

## 2015-02-06 DIAGNOSIS — M6281 Muscle weakness (generalized): Secondary | ICD-10-CM | POA: Diagnosis not present

## 2015-02-06 DIAGNOSIS — N390 Urinary tract infection, site not specified: Secondary | ICD-10-CM | POA: Diagnosis not present

## 2015-02-09 DIAGNOSIS — M6281 Muscle weakness (generalized): Secondary | ICD-10-CM | POA: Diagnosis not present

## 2015-02-09 DIAGNOSIS — R296 Repeated falls: Secondary | ICD-10-CM | POA: Diagnosis not present

## 2015-02-09 DIAGNOSIS — E119 Type 2 diabetes mellitus without complications: Secondary | ICD-10-CM | POA: Diagnosis not present

## 2015-02-09 DIAGNOSIS — N390 Urinary tract infection, site not specified: Secondary | ICD-10-CM | POA: Diagnosis not present

## 2015-02-09 DIAGNOSIS — S0093XS Contusion of unspecified part of head, sequela: Secondary | ICD-10-CM | POA: Diagnosis not present

## 2015-02-10 DIAGNOSIS — S0093XS Contusion of unspecified part of head, sequela: Secondary | ICD-10-CM | POA: Diagnosis not present

## 2015-02-10 DIAGNOSIS — M6281 Muscle weakness (generalized): Secondary | ICD-10-CM | POA: Diagnosis not present

## 2015-02-10 DIAGNOSIS — N390 Urinary tract infection, site not specified: Secondary | ICD-10-CM | POA: Diagnosis not present

## 2015-02-10 DIAGNOSIS — E119 Type 2 diabetes mellitus without complications: Secondary | ICD-10-CM | POA: Diagnosis not present

## 2015-02-10 DIAGNOSIS — R296 Repeated falls: Secondary | ICD-10-CM | POA: Diagnosis not present

## 2015-02-13 DIAGNOSIS — R296 Repeated falls: Secondary | ICD-10-CM | POA: Diagnosis not present

## 2015-02-13 DIAGNOSIS — N390 Urinary tract infection, site not specified: Secondary | ICD-10-CM | POA: Diagnosis not present

## 2015-02-13 DIAGNOSIS — E119 Type 2 diabetes mellitus without complications: Secondary | ICD-10-CM | POA: Diagnosis not present

## 2015-02-13 DIAGNOSIS — S0093XS Contusion of unspecified part of head, sequela: Secondary | ICD-10-CM | POA: Diagnosis not present

## 2015-02-13 DIAGNOSIS — M6281 Muscle weakness (generalized): Secondary | ICD-10-CM | POA: Diagnosis not present

## 2015-02-14 DIAGNOSIS — N390 Urinary tract infection, site not specified: Secondary | ICD-10-CM | POA: Diagnosis not present

## 2015-02-14 DIAGNOSIS — R296 Repeated falls: Secondary | ICD-10-CM | POA: Diagnosis not present

## 2015-02-14 DIAGNOSIS — E119 Type 2 diabetes mellitus without complications: Secondary | ICD-10-CM | POA: Diagnosis not present

## 2015-02-14 DIAGNOSIS — M6281 Muscle weakness (generalized): Secondary | ICD-10-CM | POA: Diagnosis not present

## 2015-02-14 DIAGNOSIS — S0093XS Contusion of unspecified part of head, sequela: Secondary | ICD-10-CM | POA: Diagnosis not present

## 2015-02-15 DIAGNOSIS — S0093XS Contusion of unspecified part of head, sequela: Secondary | ICD-10-CM | POA: Diagnosis not present

## 2015-02-15 DIAGNOSIS — E119 Type 2 diabetes mellitus without complications: Secondary | ICD-10-CM | POA: Diagnosis not present

## 2015-02-15 DIAGNOSIS — M6281 Muscle weakness (generalized): Secondary | ICD-10-CM | POA: Diagnosis not present

## 2015-02-15 DIAGNOSIS — R296 Repeated falls: Secondary | ICD-10-CM | POA: Diagnosis not present

## 2015-02-15 DIAGNOSIS — N390 Urinary tract infection, site not specified: Secondary | ICD-10-CM | POA: Diagnosis not present

## 2015-02-16 DIAGNOSIS — R296 Repeated falls: Secondary | ICD-10-CM | POA: Diagnosis not present

## 2015-02-16 DIAGNOSIS — S0093XS Contusion of unspecified part of head, sequela: Secondary | ICD-10-CM | POA: Diagnosis not present

## 2015-02-16 DIAGNOSIS — E119 Type 2 diabetes mellitus without complications: Secondary | ICD-10-CM | POA: Diagnosis not present

## 2015-02-16 DIAGNOSIS — M6281 Muscle weakness (generalized): Secondary | ICD-10-CM | POA: Diagnosis not present

## 2015-02-16 DIAGNOSIS — N390 Urinary tract infection, site not specified: Secondary | ICD-10-CM | POA: Diagnosis not present

## 2015-02-20 DIAGNOSIS — E119 Type 2 diabetes mellitus without complications: Secondary | ICD-10-CM | POA: Diagnosis not present

## 2015-02-20 DIAGNOSIS — H401112 Primary open-angle glaucoma, right eye, moderate stage: Secondary | ICD-10-CM | POA: Diagnosis not present

## 2015-02-20 DIAGNOSIS — H353131 Nonexudative age-related macular degeneration, bilateral, early dry stage: Secondary | ICD-10-CM | POA: Diagnosis not present

## 2015-02-20 DIAGNOSIS — H401123 Primary open-angle glaucoma, left eye, severe stage: Secondary | ICD-10-CM | POA: Diagnosis not present

## 2015-02-21 DIAGNOSIS — E119 Type 2 diabetes mellitus without complications: Secondary | ICD-10-CM | POA: Diagnosis not present

## 2015-02-21 DIAGNOSIS — M6281 Muscle weakness (generalized): Secondary | ICD-10-CM | POA: Diagnosis not present

## 2015-02-21 DIAGNOSIS — N390 Urinary tract infection, site not specified: Secondary | ICD-10-CM | POA: Diagnosis not present

## 2015-02-21 DIAGNOSIS — S0093XS Contusion of unspecified part of head, sequela: Secondary | ICD-10-CM | POA: Diagnosis not present

## 2015-02-21 DIAGNOSIS — R296 Repeated falls: Secondary | ICD-10-CM | POA: Diagnosis not present

## 2015-02-22 DIAGNOSIS — M6281 Muscle weakness (generalized): Secondary | ICD-10-CM | POA: Diagnosis not present

## 2015-02-22 DIAGNOSIS — E119 Type 2 diabetes mellitus without complications: Secondary | ICD-10-CM | POA: Diagnosis not present

## 2015-02-22 DIAGNOSIS — N390 Urinary tract infection, site not specified: Secondary | ICD-10-CM | POA: Diagnosis not present

## 2015-02-22 DIAGNOSIS — S0093XS Contusion of unspecified part of head, sequela: Secondary | ICD-10-CM | POA: Diagnosis not present

## 2015-02-22 DIAGNOSIS — R296 Repeated falls: Secondary | ICD-10-CM | POA: Diagnosis not present

## 2015-02-24 DIAGNOSIS — S0093XS Contusion of unspecified part of head, sequela: Secondary | ICD-10-CM | POA: Diagnosis not present

## 2015-02-24 DIAGNOSIS — E119 Type 2 diabetes mellitus without complications: Secondary | ICD-10-CM | POA: Diagnosis not present

## 2015-02-24 DIAGNOSIS — N390 Urinary tract infection, site not specified: Secondary | ICD-10-CM | POA: Diagnosis not present

## 2015-02-24 DIAGNOSIS — M6281 Muscle weakness (generalized): Secondary | ICD-10-CM | POA: Diagnosis not present

## 2015-02-24 DIAGNOSIS — R296 Repeated falls: Secondary | ICD-10-CM | POA: Diagnosis not present

## 2015-02-28 DIAGNOSIS — E119 Type 2 diabetes mellitus without complications: Secondary | ICD-10-CM | POA: Diagnosis not present

## 2015-02-28 DIAGNOSIS — N390 Urinary tract infection, site not specified: Secondary | ICD-10-CM | POA: Diagnosis not present

## 2015-02-28 DIAGNOSIS — R296 Repeated falls: Secondary | ICD-10-CM | POA: Diagnosis not present

## 2015-02-28 DIAGNOSIS — M6281 Muscle weakness (generalized): Secondary | ICD-10-CM | POA: Diagnosis not present

## 2015-02-28 DIAGNOSIS — S0093XS Contusion of unspecified part of head, sequela: Secondary | ICD-10-CM | POA: Diagnosis not present

## 2015-03-01 DIAGNOSIS — E785 Hyperlipidemia, unspecified: Secondary | ICD-10-CM | POA: Diagnosis present

## 2015-03-01 DIAGNOSIS — Z780 Asymptomatic menopausal state: Secondary | ICD-10-CM | POA: Diagnosis not present

## 2015-03-01 DIAGNOSIS — E119 Type 2 diabetes mellitus without complications: Secondary | ICD-10-CM | POA: Diagnosis present

## 2015-03-01 DIAGNOSIS — M797 Fibromyalgia: Secondary | ICD-10-CM | POA: Diagnosis not present

## 2015-03-01 DIAGNOSIS — I959 Hypotension, unspecified: Secondary | ICD-10-CM | POA: Diagnosis not present

## 2015-03-01 DIAGNOSIS — R52 Pain, unspecified: Secondary | ICD-10-CM | POA: Diagnosis not present

## 2015-03-01 DIAGNOSIS — Z882 Allergy status to sulfonamides status: Secondary | ICD-10-CM | POA: Diagnosis not present

## 2015-03-01 DIAGNOSIS — R531 Weakness: Secondary | ICD-10-CM | POA: Diagnosis not present

## 2015-03-01 DIAGNOSIS — E860 Dehydration: Secondary | ICD-10-CM | POA: Diagnosis not present

## 2015-03-01 DIAGNOSIS — N289 Disorder of kidney and ureter, unspecified: Secondary | ICD-10-CM | POA: Diagnosis not present

## 2015-03-01 DIAGNOSIS — N179 Acute kidney failure, unspecified: Secondary | ICD-10-CM | POA: Diagnosis present

## 2015-03-01 DIAGNOSIS — Z886 Allergy status to analgesic agent status: Secondary | ICD-10-CM | POA: Diagnosis not present

## 2015-03-01 DIAGNOSIS — K449 Diaphragmatic hernia without obstruction or gangrene: Secondary | ICD-10-CM | POA: Diagnosis present

## 2015-03-01 DIAGNOSIS — R2689 Other abnormalities of gait and mobility: Secondary | ICD-10-CM | POA: Diagnosis not present

## 2015-03-01 DIAGNOSIS — G8929 Other chronic pain: Secondary | ICD-10-CM | POA: Diagnosis present

## 2015-03-01 DIAGNOSIS — G908 Other disorders of autonomic nervous system: Secondary | ICD-10-CM | POA: Diagnosis not present

## 2015-03-01 DIAGNOSIS — Z885 Allergy status to narcotic agent status: Secondary | ICD-10-CM | POA: Diagnosis not present

## 2015-03-01 DIAGNOSIS — I10 Essential (primary) hypertension: Secondary | ICD-10-CM | POA: Diagnosis present

## 2015-03-01 DIAGNOSIS — D6959 Other secondary thrombocytopenia: Secondary | ICD-10-CM | POA: Diagnosis not present

## 2015-03-01 DIAGNOSIS — M25561 Pain in right knee: Secondary | ICD-10-CM | POA: Diagnosis not present

## 2015-03-01 DIAGNOSIS — M25552 Pain in left hip: Secondary | ICD-10-CM | POA: Diagnosis present

## 2015-03-01 DIAGNOSIS — Z951 Presence of aortocoronary bypass graft: Secondary | ICD-10-CM | POA: Diagnosis not present

## 2015-03-01 DIAGNOSIS — M545 Low back pain: Secondary | ICD-10-CM | POA: Diagnosis present

## 2015-03-01 DIAGNOSIS — F319 Bipolar disorder, unspecified: Secondary | ICD-10-CM | POA: Diagnosis present

## 2015-03-01 DIAGNOSIS — N390 Urinary tract infection, site not specified: Secondary | ICD-10-CM | POA: Diagnosis not present

## 2015-03-01 DIAGNOSIS — M25562 Pain in left knee: Secondary | ICD-10-CM | POA: Diagnosis not present

## 2015-03-01 DIAGNOSIS — I2510 Atherosclerotic heart disease of native coronary artery without angina pectoris: Secondary | ICD-10-CM | POA: Diagnosis present

## 2015-03-01 DIAGNOSIS — M6281 Muscle weakness (generalized): Secondary | ICD-10-CM | POA: Diagnosis not present

## 2015-03-01 DIAGNOSIS — Z888 Allergy status to other drugs, medicaments and biological substances status: Secondary | ICD-10-CM | POA: Diagnosis not present

## 2015-03-01 DIAGNOSIS — R32 Unspecified urinary incontinence: Secondary | ICD-10-CM | POA: Diagnosis present

## 2015-03-01 DIAGNOSIS — N178 Other acute kidney failure: Secondary | ICD-10-CM | POA: Diagnosis not present

## 2015-03-01 DIAGNOSIS — Z95828 Presence of other vascular implants and grafts: Secondary | ICD-10-CM | POA: Diagnosis not present

## 2015-03-01 DIAGNOSIS — T45525A Adverse effect of antithrombotic drugs, initial encounter: Secondary | ICD-10-CM | POA: Diagnosis present

## 2015-03-01 DIAGNOSIS — R55 Syncope and collapse: Secondary | ICD-10-CM | POA: Diagnosis not present

## 2015-03-01 DIAGNOSIS — M549 Dorsalgia, unspecified: Secondary | ICD-10-CM | POA: Diagnosis not present

## 2015-03-07 DIAGNOSIS — R55 Syncope and collapse: Secondary | ICD-10-CM | POA: Diagnosis not present

## 2015-03-07 DIAGNOSIS — D6959 Other secondary thrombocytopenia: Secondary | ICD-10-CM | POA: Diagnosis not present

## 2015-03-07 DIAGNOSIS — M6281 Muscle weakness (generalized): Secondary | ICD-10-CM | POA: Diagnosis not present

## 2015-03-07 DIAGNOSIS — E785 Hyperlipidemia, unspecified: Secondary | ICD-10-CM | POA: Diagnosis not present

## 2015-03-07 DIAGNOSIS — R2689 Other abnormalities of gait and mobility: Secondary | ICD-10-CM | POA: Diagnosis not present

## 2015-03-07 DIAGNOSIS — N178 Other acute kidney failure: Secondary | ICD-10-CM | POA: Diagnosis not present

## 2015-03-07 DIAGNOSIS — M545 Low back pain: Secondary | ICD-10-CM | POA: Diagnosis not present

## 2015-03-07 DIAGNOSIS — N179 Acute kidney failure, unspecified: Secondary | ICD-10-CM | POA: Diagnosis not present

## 2015-03-07 DIAGNOSIS — G8929 Other chronic pain: Secondary | ICD-10-CM | POA: Diagnosis not present

## 2015-03-07 DIAGNOSIS — E860 Dehydration: Secondary | ICD-10-CM | POA: Diagnosis not present

## 2015-03-07 DIAGNOSIS — I2510 Atherosclerotic heart disease of native coronary artery without angina pectoris: Secondary | ICD-10-CM | POA: Diagnosis not present

## 2015-03-07 DIAGNOSIS — N390 Urinary tract infection, site not specified: Secondary | ICD-10-CM | POA: Diagnosis not present

## 2015-03-07 DIAGNOSIS — G908 Other disorders of autonomic nervous system: Secondary | ICD-10-CM | POA: Diagnosis not present

## 2015-03-07 DIAGNOSIS — E119 Type 2 diabetes mellitus without complications: Secondary | ICD-10-CM | POA: Diagnosis not present

## 2015-03-07 DIAGNOSIS — I10 Essential (primary) hypertension: Secondary | ICD-10-CM | POA: Diagnosis not present

## 2015-03-07 DIAGNOSIS — F319 Bipolar disorder, unspecified: Secondary | ICD-10-CM | POA: Diagnosis not present

## 2015-03-07 DIAGNOSIS — M797 Fibromyalgia: Secondary | ICD-10-CM | POA: Diagnosis not present

## 2015-03-07 DIAGNOSIS — I959 Hypotension, unspecified: Secondary | ICD-10-CM | POA: Diagnosis not present

## 2015-04-07 DIAGNOSIS — I10 Essential (primary) hypertension: Secondary | ICD-10-CM | POA: Diagnosis not present

## 2015-04-07 DIAGNOSIS — E1121 Type 2 diabetes mellitus with diabetic nephropathy: Secondary | ICD-10-CM | POA: Diagnosis not present

## 2015-04-07 DIAGNOSIS — N3001 Acute cystitis with hematuria: Secondary | ICD-10-CM | POA: Diagnosis not present

## 2015-04-07 DIAGNOSIS — E119 Type 2 diabetes mellitus without complications: Secondary | ICD-10-CM | POA: Diagnosis not present

## 2015-04-07 DIAGNOSIS — M6281 Muscle weakness (generalized): Secondary | ICD-10-CM | POA: Diagnosis not present

## 2015-04-07 DIAGNOSIS — F319 Bipolar disorder, unspecified: Secondary | ICD-10-CM | POA: Diagnosis not present

## 2015-04-07 DIAGNOSIS — D696 Thrombocytopenia, unspecified: Secondary | ICD-10-CM | POA: Diagnosis not present

## 2015-04-07 DIAGNOSIS — I2510 Atherosclerotic heart disease of native coronary artery without angina pectoris: Secondary | ICD-10-CM | POA: Diagnosis not present

## 2015-04-10 ENCOUNTER — Encounter: Payer: Self-pay | Admitting: *Deleted

## 2015-04-11 DIAGNOSIS — E119 Type 2 diabetes mellitus without complications: Secondary | ICD-10-CM | POA: Diagnosis not present

## 2015-04-11 DIAGNOSIS — F319 Bipolar disorder, unspecified: Secondary | ICD-10-CM | POA: Diagnosis not present

## 2015-04-11 DIAGNOSIS — D696 Thrombocytopenia, unspecified: Secondary | ICD-10-CM | POA: Diagnosis not present

## 2015-04-11 DIAGNOSIS — M6281 Muscle weakness (generalized): Secondary | ICD-10-CM | POA: Diagnosis not present

## 2015-04-11 DIAGNOSIS — I2510 Atherosclerotic heart disease of native coronary artery without angina pectoris: Secondary | ICD-10-CM | POA: Diagnosis not present

## 2015-04-11 DIAGNOSIS — I10 Essential (primary) hypertension: Secondary | ICD-10-CM | POA: Diagnosis not present

## 2015-04-12 DIAGNOSIS — E119 Type 2 diabetes mellitus without complications: Secondary | ICD-10-CM | POA: Diagnosis not present

## 2015-04-12 DIAGNOSIS — I10 Essential (primary) hypertension: Secondary | ICD-10-CM | POA: Diagnosis not present

## 2015-04-12 DIAGNOSIS — F319 Bipolar disorder, unspecified: Secondary | ICD-10-CM | POA: Diagnosis not present

## 2015-04-12 DIAGNOSIS — M6281 Muscle weakness (generalized): Secondary | ICD-10-CM | POA: Diagnosis not present

## 2015-04-12 DIAGNOSIS — I2510 Atherosclerotic heart disease of native coronary artery without angina pectoris: Secondary | ICD-10-CM | POA: Diagnosis not present

## 2015-04-12 DIAGNOSIS — D696 Thrombocytopenia, unspecified: Secondary | ICD-10-CM | POA: Diagnosis not present

## 2015-04-14 DIAGNOSIS — I10 Essential (primary) hypertension: Secondary | ICD-10-CM | POA: Diagnosis not present

## 2015-04-14 DIAGNOSIS — I2510 Atherosclerotic heart disease of native coronary artery without angina pectoris: Secondary | ICD-10-CM | POA: Diagnosis not present

## 2015-04-14 DIAGNOSIS — D696 Thrombocytopenia, unspecified: Secondary | ICD-10-CM | POA: Diagnosis not present

## 2015-04-14 DIAGNOSIS — E119 Type 2 diabetes mellitus without complications: Secondary | ICD-10-CM | POA: Diagnosis not present

## 2015-04-14 DIAGNOSIS — M6281 Muscle weakness (generalized): Secondary | ICD-10-CM | POA: Diagnosis not present

## 2015-04-14 DIAGNOSIS — F319 Bipolar disorder, unspecified: Secondary | ICD-10-CM | POA: Diagnosis not present

## 2015-04-17 DIAGNOSIS — M6281 Muscle weakness (generalized): Secondary | ICD-10-CM | POA: Diagnosis not present

## 2015-04-17 DIAGNOSIS — D696 Thrombocytopenia, unspecified: Secondary | ICD-10-CM | POA: Diagnosis not present

## 2015-04-17 DIAGNOSIS — I2510 Atherosclerotic heart disease of native coronary artery without angina pectoris: Secondary | ICD-10-CM | POA: Diagnosis not present

## 2015-04-17 DIAGNOSIS — F319 Bipolar disorder, unspecified: Secondary | ICD-10-CM | POA: Diagnosis not present

## 2015-04-17 DIAGNOSIS — I10 Essential (primary) hypertension: Secondary | ICD-10-CM | POA: Diagnosis not present

## 2015-04-17 DIAGNOSIS — E119 Type 2 diabetes mellitus without complications: Secondary | ICD-10-CM | POA: Diagnosis not present

## 2015-04-25 DIAGNOSIS — E119 Type 2 diabetes mellitus without complications: Secondary | ICD-10-CM | POA: Diagnosis not present

## 2015-04-25 DIAGNOSIS — I10 Essential (primary) hypertension: Secondary | ICD-10-CM | POA: Diagnosis not present

## 2015-04-25 DIAGNOSIS — D696 Thrombocytopenia, unspecified: Secondary | ICD-10-CM | POA: Diagnosis not present

## 2015-04-25 DIAGNOSIS — I2510 Atherosclerotic heart disease of native coronary artery without angina pectoris: Secondary | ICD-10-CM | POA: Diagnosis not present

## 2015-04-25 DIAGNOSIS — F319 Bipolar disorder, unspecified: Secondary | ICD-10-CM | POA: Diagnosis not present

## 2015-04-25 DIAGNOSIS — M6281 Muscle weakness (generalized): Secondary | ICD-10-CM | POA: Diagnosis not present

## 2015-05-04 DIAGNOSIS — R3915 Urgency of urination: Secondary | ICD-10-CM | POA: Diagnosis not present

## 2015-05-04 DIAGNOSIS — R351 Nocturia: Secondary | ICD-10-CM | POA: Diagnosis not present

## 2015-05-04 DIAGNOSIS — N3941 Urge incontinence: Secondary | ICD-10-CM | POA: Diagnosis not present

## 2015-05-04 DIAGNOSIS — N3000 Acute cystitis without hematuria: Secondary | ICD-10-CM | POA: Diagnosis not present

## 2015-05-05 DIAGNOSIS — I2510 Atherosclerotic heart disease of native coronary artery without angina pectoris: Secondary | ICD-10-CM | POA: Diagnosis not present

## 2015-05-05 DIAGNOSIS — F319 Bipolar disorder, unspecified: Secondary | ICD-10-CM | POA: Diagnosis not present

## 2015-05-05 DIAGNOSIS — D696 Thrombocytopenia, unspecified: Secondary | ICD-10-CM | POA: Diagnosis not present

## 2015-05-05 DIAGNOSIS — M6281 Muscle weakness (generalized): Secondary | ICD-10-CM | POA: Diagnosis not present

## 2015-05-05 DIAGNOSIS — E119 Type 2 diabetes mellitus without complications: Secondary | ICD-10-CM | POA: Diagnosis not present

## 2015-05-05 DIAGNOSIS — I10 Essential (primary) hypertension: Secondary | ICD-10-CM | POA: Diagnosis not present

## 2015-05-09 DIAGNOSIS — E119 Type 2 diabetes mellitus without complications: Secondary | ICD-10-CM | POA: Diagnosis not present

## 2015-05-09 DIAGNOSIS — I2510 Atherosclerotic heart disease of native coronary artery without angina pectoris: Secondary | ICD-10-CM | POA: Diagnosis not present

## 2015-05-09 DIAGNOSIS — M6281 Muscle weakness (generalized): Secondary | ICD-10-CM | POA: Diagnosis not present

## 2015-05-09 DIAGNOSIS — F319 Bipolar disorder, unspecified: Secondary | ICD-10-CM | POA: Diagnosis not present

## 2015-05-09 DIAGNOSIS — I10 Essential (primary) hypertension: Secondary | ICD-10-CM | POA: Diagnosis not present

## 2015-05-09 DIAGNOSIS — D696 Thrombocytopenia, unspecified: Secondary | ICD-10-CM | POA: Diagnosis not present

## 2015-05-11 DIAGNOSIS — D696 Thrombocytopenia, unspecified: Secondary | ICD-10-CM | POA: Diagnosis not present

## 2015-05-11 DIAGNOSIS — I10 Essential (primary) hypertension: Secondary | ICD-10-CM | POA: Diagnosis not present

## 2015-05-11 DIAGNOSIS — M6281 Muscle weakness (generalized): Secondary | ICD-10-CM | POA: Diagnosis not present

## 2015-05-11 DIAGNOSIS — I2510 Atherosclerotic heart disease of native coronary artery without angina pectoris: Secondary | ICD-10-CM | POA: Diagnosis not present

## 2015-05-11 DIAGNOSIS — F319 Bipolar disorder, unspecified: Secondary | ICD-10-CM | POA: Diagnosis not present

## 2015-05-11 DIAGNOSIS — E119 Type 2 diabetes mellitus without complications: Secondary | ICD-10-CM | POA: Diagnosis not present

## 2015-05-22 DIAGNOSIS — N905 Atrophy of vulva: Secondary | ICD-10-CM | POA: Diagnosis not present

## 2015-05-22 DIAGNOSIS — R3915 Urgency of urination: Secondary | ICD-10-CM | POA: Diagnosis not present

## 2015-05-22 DIAGNOSIS — N3000 Acute cystitis without hematuria: Secondary | ICD-10-CM | POA: Diagnosis not present

## 2015-05-23 DIAGNOSIS — E1342 Other specified diabetes mellitus with diabetic polyneuropathy: Secondary | ICD-10-CM | POA: Diagnosis not present

## 2015-05-23 DIAGNOSIS — B351 Tinea unguium: Secondary | ICD-10-CM | POA: Diagnosis not present

## 2015-06-05 DIAGNOSIS — N3000 Acute cystitis without hematuria: Secondary | ICD-10-CM | POA: Diagnosis not present

## 2015-06-12 DIAGNOSIS — N390 Urinary tract infection, site not specified: Secondary | ICD-10-CM | POA: Diagnosis not present

## 2015-06-13 DIAGNOSIS — N390 Urinary tract infection, site not specified: Secondary | ICD-10-CM | POA: Diagnosis not present

## 2015-06-14 DIAGNOSIS — N390 Urinary tract infection, site not specified: Secondary | ICD-10-CM | POA: Diagnosis not present

## 2015-06-15 DIAGNOSIS — N3000 Acute cystitis without hematuria: Secondary | ICD-10-CM | POA: Diagnosis not present

## 2015-06-16 DIAGNOSIS — N3000 Acute cystitis without hematuria: Secondary | ICD-10-CM | POA: Diagnosis not present

## 2015-06-17 DIAGNOSIS — N3000 Acute cystitis without hematuria: Secondary | ICD-10-CM | POA: Diagnosis not present

## 2015-06-18 DIAGNOSIS — N3000 Acute cystitis without hematuria: Secondary | ICD-10-CM | POA: Diagnosis not present

## 2015-06-19 DIAGNOSIS — N3000 Acute cystitis without hematuria: Secondary | ICD-10-CM | POA: Diagnosis not present

## 2015-06-20 DIAGNOSIS — N390 Urinary tract infection, site not specified: Secondary | ICD-10-CM | POA: Diagnosis not present

## 2015-06-21 DIAGNOSIS — N390 Urinary tract infection, site not specified: Secondary | ICD-10-CM | POA: Diagnosis not present

## 2015-07-05 DIAGNOSIS — I10 Essential (primary) hypertension: Secondary | ICD-10-CM | POA: Diagnosis not present

## 2015-07-05 DIAGNOSIS — E1121 Type 2 diabetes mellitus with diabetic nephropathy: Secondary | ICD-10-CM | POA: Diagnosis not present

## 2015-07-07 ENCOUNTER — Encounter: Payer: Self-pay | Admitting: Cardiology

## 2015-07-07 ENCOUNTER — Ambulatory Visit (INDEPENDENT_AMBULATORY_CARE_PROVIDER_SITE_OTHER): Payer: Medicare Other | Admitting: Cardiology

## 2015-07-07 VITALS — BP 106/61 | HR 46 | Ht 63.0 in | Wt 127.0 lb

## 2015-07-07 DIAGNOSIS — M353 Polymyalgia rheumatica: Secondary | ICD-10-CM | POA: Diagnosis not present

## 2015-07-07 DIAGNOSIS — R55 Syncope and collapse: Secondary | ICD-10-CM | POA: Diagnosis not present

## 2015-07-07 DIAGNOSIS — I10 Essential (primary) hypertension: Secondary | ICD-10-CM | POA: Diagnosis not present

## 2015-07-07 DIAGNOSIS — I359 Nonrheumatic aortic valve disorder, unspecified: Secondary | ICD-10-CM | POA: Diagnosis not present

## 2015-07-07 DIAGNOSIS — I2510 Atherosclerotic heart disease of native coronary artery without angina pectoris: Secondary | ICD-10-CM

## 2015-07-07 DIAGNOSIS — E1121 Type 2 diabetes mellitus with diabetic nephropathy: Secondary | ICD-10-CM | POA: Diagnosis not present

## 2015-07-07 NOTE — Progress Notes (Signed)
Clinical Summary Sharon Hubbard is a 82 y.o.female seen today for follow up of the following medical problems.   1. CAD - history of single vessel CABG 08/2014 (RIMA to RCA). In 2012 hx of DES to mid LAD. In 03/2013 history of additional DES to LAD.  - 07/2014 echo LVEF 0000000, grade I diastolic dysfunction  - denies any chest pain, no SOB  2. Aortic valve stenosis - history of AVR Aug 2016 for aortic stenosis, she received a 21 mm [facility] ease pericardial tissue valve. - no recent SOB/DOE, no LE edema   3. Syncope - recent admission to [facility] with episode of syncope - from notes she had evidence of dehydration as well as UTI. Cr 1.79 on admission, down to 1.2 with IVFs.  - started on midodrine during that admission according to dishcarge summary, but patient unsure if she is taking.  - no recurrent episodes  4. Bradycardia - no significant dizziness, can have some weakness at times.     SH: multiple family members dealing with cancer including her son and great grandson. Her husband had a prior stroke, he is fairly dependent on her at home.  Past Medical History  Diagnosis Date  . CAD (coronary artery disease)     Interventions in the past  . Groin hematoma     April, 2011  . Hypertension   . Diabetes mellitus   . Dyslipidemia   . GERD (gastroesophageal reflux disease)   . Schatzki's ring   . Renal insufficiency   . Polymyalgia rheumatica ([place])   . Osteoarthritis     arthroscopic surgery right knee February, 2012  . Spinal stenosis     history of surgery for this  . Ejection fraction     EF 65%, echo, April, 2012, aortic valve sclerosis with very slight gradient  . Preoperative evaluation to rule out surgical contraindication     Patient needs back surgery by Dr. Carloyn Manner, May, 2012                . Pancreas cyst     The patient has multiple pancreatic cysts.  This is followed at [facility]         . Complication of anesthesia     "felt like I was smothering  once with mask on my face"  . RBBB (right bundle Sharon Hubbard block)     old  . Bradycardia, sinus   . Myocardial infarction [facility]) 2011; 08/2010  . Shortness of breath on exertion   . Blood transfusion   . Anemia   . Stroke Sharon Hubbard) 01/18/11    "I've had 2 TIA's"  . Cancer (Sharon Hubbard)     ""had hysterectomy for a 6 showing cancer; think that's pretty strong  . Aortic stenosis     very mild, echo 12/2010  . Femoral bruit     01/2011 hosp, but no pseudoaneurysm or AV fistula     Allergies  Allergen Reactions  . Clarithromycin     REACTION: Vomiting and diarrhea.  . Codeine   . Sulfa Antibiotics   . Atorvastatin Other (See Comments)    Made her knees buckle and muscles ache     Current Outpatient Prescriptions  Medication Sig Dispense Refill  . amLODipine (NORVASC) 10 MG tablet Take 10 mg by mouth daily.      Marland Kitchen aspirin 81 MG tablet Take 81 mg by mouth daily.      . clopidogrel (PLAVIX) 75 MG tablet Take 1 tablet (75  mg total) by mouth daily. 90 tablet 3  . divalproex (DEPAKOTE) 500 MG 24 hr tablet Take 500 mg by mouth daily.      . furosemide (LASIX) 40 MG tablet Take one tab by mouth every morning 180 tablet 3  . HYDROcodone-acetaminophen (NORCO/VICODIN) 5-325 MG per tablet Take 1 tablet by mouth every 4 (four) hours as needed for severe pain. 30 tablet 0  . insulin lispro (HUMALOG KWIKPEN) 100 UNIT/ML KiwkPen Inject 5 Units into the skin 3 (three) times daily before meals.    . isosorbide mononitrate (IMDUR) 60 MG 24 hr tablet Take 60 mg by mouth daily.    Marland Kitchen latanoprost (XALATAN) 0.005 % ophthalmic solution Place 1 drop into both eyes at bedtime.      Marland Kitchen lisinopril (PRINIVIL,ZESTRIL) 20 MG tablet Take 1 tablet (20 mg total) by mouth daily.    . metoprolol tartrate (LOPRESSOR) 25 MG tablet Take 0.5 tablets (12.5 mg total) by mouth 2 (two) times daily.    . mirabegron ER (MYRBETRIQ) 25 MG TB24 tablet Take 25 mg by mouth daily.    . pantoprazole (PROTONIX) 40 MG tablet Take 40 mg by mouth  daily.    . potassium chloride SA (K-DUR,KLOR-CON) 20 MEQ tablet Take 20 mEq by mouth daily.    . predniSONE (DELTASONE) 1 MG tablet Take 4 mg by mouth every morning.     . rosuvastatin (CRESTOR) 20 MG tablet Take 20 mg by mouth daily.    . timolol (TIMOPTIC) 0.5 % ophthalmic solution Place 1 drop into both eyes daily.      No current facility-administered medications for this visit.     Past Surgical History  Procedure Laterality Date  . Cholecystectomy  1987  . Appendectomy    . Back surgery    . Lumbar spine surgery  ~ 1977; ~ 2010    "2 hugh ruptured discs; I was paralyzed first OR; 2nd OR by Dr. Carloyn Manner, don't know what for"  . Tubal ligation  1970's  . Dilation and curettage of uterus    . Abdominal hysterectomy  ~ 1974  . Knee arthroscopy  02/2010    right knee; chrondoplasty medial femoral condyle;  Partial medial meniscectomy.   . Coronary angioplasty with stent placement  2011    "in [place]"  . Coronary angioplasty with stent placement  08/2010; 03/25/2013  . Cardiac catheterization  01/18/11  . Femoral artery exploration Right 03/26/2013    Procedure: FEMORAL ARTERY EXPLORATION WITH SUTURE REPAIR; EVACUATION OF HEMATOMA;  Surgeon: Mal Misty, MD;  Location: [place];  Service: Vascular;  Laterality: Right;  . Coronary angiogram  01/18/2011    Procedure: CORONARY ANGIOGRAM;  Surgeon: Larey Dresser, MD;  Location: [facility] CATH LAB;  Service: Cardiovascular;;  . Left and right heart catheterization with coronary angiogram N/A 03/25/2013    Procedure: LEFT AND RIGHT HEART CATHETERIZATION WITH CORONARY ANGIOGRAM;  Surgeon: Burnell Blanks, MD;  Location: [facility] CATH LAB;  Service: Cardiovascular;  Laterality: N/A;  . Cardiac catheterization N/A 08/16/2014    Procedure: Left Heart Cath and Coronary Angiography;  Surgeon: Leonie Man, MD;  Location: [place] CV LAB;  Service: Cardiovascular;  Laterality: N/A;  . Cardiac catheterization N/A 08/16/2014    Procedure: Coronary Stent  Intervention;  Surgeon: Leonie Man, MD;  Location: [place] CV LAB;  Service: Cardiovascular;  Laterality: N/A;  . Cardiac catheterization N/A 08/17/2014    Procedure: Coronary/Graft Atherectomy;  Surgeon: Burnell Blanks, MD;  Location: [place]  CV LAB;  Service: Cardiovascular;  Laterality: N/A;  . Coronary artery bypass graft N/A 08/22/2014    Procedure: CORONARY ARTERY BYPASS GRAFTING (CABG), ON PUMP, TIMES ONE, USING RIGHT INTERNAL MAMMARY ARTERY;  Surgeon: Melrose Nakayama, MD;  Location: [place];  Service: Open Heart Surgery;  Laterality: N/A;  . Aortic valve replacement N/A 08/22/2014    Procedure: AORTIC VALVE REPLACEMENT (AVR);  Surgeon: Melrose Nakayama, MD;  Location: [place];  Service: Open Heart Surgery;  Laterality: N/A;  . Tee without cardioversion N/A 08/22/2014    Procedure: TRANSESOPHAGEAL ECHOCARDIOGRAM (TEE);  Surgeon: Melrose Nakayama, MD;  Location: [place];  Service: Open Heart Surgery;  Laterality: N/A;     Allergies  Allergen Reactions  . Clarithromycin     REACTION: Vomiting and diarrhea.  . Codeine   . Sulfa Antibiotics   . Atorvastatin Other (See Comments)    Made her knees buckle and muscles ache      Family History  Problem Relation Age of Onset  . Uterine cancer Mother 31  . Other Father 62    cervical fracture     Social History Ms. Giang reports that she has never smoked. She has never used smokeless tobacco. Ms. Saintfleur reports that she does not drink alcohol.   Review of Systems CONSTITUTIONAL: No weight loss, fever, chills, weakness or fatigue.  HEENT: Eyes: No visual loss, blurred vision, double vision or yellow sclerae.No hearing loss, sneezing, congestion, runny nose or sore throat.  SKIN: No rash or itching.  CARDIOVASCULAR: per HPI RESPIRATORY: No shortness of breath, cough or sputum.  GASTROINTESTINAL: No anorexia, nausea, vomiting or diarrhea. No abdominal pain or blood.  GENITOURINARY: No burning on  urination, no polyuria NEUROLOGICAL: No headache, dizziness, syncope, paralysis, ataxia, numbness or tingling in the extremities. No change in bowel or bladder control.  MUSCULOSKELETAL: No muscle, back pain, joint pain or stiffness.  LYMPHATICS: No enlarged nodes. No history of splenectomy.  PSYCHIATRIC: No history of depression or anxiety.  ENDOCRINOLOGIC: No reports of sweating, cold or heat intolerance. No polyuria or polydipsia.  Marland Kitchen   Physical Examination Filed Vitals:   07/07/15 1115  BP: 106/61  Pulse: 46   Filed Vitals:   07/07/15 1115  Height: 5\' 3"  (1.6 m)  Weight: 127 lb (57.607 kg)    Gen: resting comfortably, no acute distress HEENT: no scleral icterus, pupils equal round and reactive, no palptable cervical adenopathy,  CV: RRR, no m/r/g, no jvd Resp: Clear to auscultation bilaterally GI: abdomen is soft, non-tender, non-distended, normal bowel sounds, no hepatosplenomegaly MSK: extremities are warm, no edema.  Skin: warm, no rash Neuro:  no focal deficits Psych: appropriate affect     Assessment and Plan   1. CAD - no current symptoms - we will continue current meds  2. Aortic valve disease - s/p recent replacement, doing well. We will continue to monitor  3. Syncope - episode looks to have been secondary to dehydration an hypovolemia, potentially some component of UTI played a role - no recurrent episodes - her discharge medication regimen remains unclear. Her discharge summary mentioned quite a few changes, including starting midodrine. Patient is unsure what she is actually taking at home - she will come in next week with all her pill bottles so we may clarify what she is taking. I would likely d/c midodrine if she is taking, as it appears from the data I have her syncope was secondary to dehydration as opposed to true autonomic dysfunction. If neccesary  would lean down downtitrating bp meds prior to starting midodrine as well.   F/u 6 months. Nursing  visit next week to clarify home meds.    Arnoldo Lenis, M.D.

## 2015-07-07 NOTE — Patient Instructions (Signed)
Medication Instructions:    Labwork:   Testing/Procedures:   Follow-Up: Nurse visit next week for medication review.  Bring all medication bottles with you.    Any Other Special Instructions Will Be Listed Below (If Applicable).  If you need a refill on your cardiac medications before your next appointment, please call your pharmacy.

## 2015-07-10 ENCOUNTER — Ambulatory Visit: Payer: Medicare Other | Admitting: Cardiology

## 2015-07-11 DIAGNOSIS — R3915 Urgency of urination: Secondary | ICD-10-CM | POA: Diagnosis not present

## 2015-07-11 DIAGNOSIS — N390 Urinary tract infection, site not specified: Secondary | ICD-10-CM | POA: Diagnosis not present

## 2015-07-13 ENCOUNTER — Ambulatory Visit (INDEPENDENT_AMBULATORY_CARE_PROVIDER_SITE_OTHER): Payer: Medicare Other | Admitting: *Deleted

## 2015-07-13 DIAGNOSIS — I2510 Atherosclerotic heart disease of native coronary artery without angina pectoris: Secondary | ICD-10-CM

## 2015-07-13 NOTE — Progress Notes (Signed)
Patient came to office today to have her medications reviewed since she forgot to bring them to her recent office visit. Medications reconciled.

## 2015-07-17 DIAGNOSIS — L570 Actinic keratosis: Secondary | ICD-10-CM | POA: Diagnosis not present

## 2015-07-17 DIAGNOSIS — C4441 Basal cell carcinoma of skin of scalp and neck: Secondary | ICD-10-CM | POA: Diagnosis not present

## 2015-07-17 DIAGNOSIS — Z85828 Personal history of other malignant neoplasm of skin: Secondary | ICD-10-CM | POA: Diagnosis not present

## 2015-07-17 DIAGNOSIS — D485 Neoplasm of uncertain behavior of skin: Secondary | ICD-10-CM | POA: Diagnosis not present

## 2015-07-17 DIAGNOSIS — D0439 Carcinoma in situ of skin of other parts of face: Secondary | ICD-10-CM | POA: Diagnosis not present

## 2015-07-17 NOTE — Progress Notes (Signed)
Med list reviewed, looks fine  J Katora Fini MD

## 2015-07-27 DIAGNOSIS — C44329 Squamous cell carcinoma of skin of other parts of face: Secondary | ICD-10-CM | POA: Diagnosis not present

## 2015-07-27 DIAGNOSIS — C4441 Basal cell carcinoma of skin of scalp and neck: Secondary | ICD-10-CM | POA: Diagnosis not present

## 2015-08-08 DIAGNOSIS — E1342 Other specified diabetes mellitus with diabetic polyneuropathy: Secondary | ICD-10-CM | POA: Diagnosis not present

## 2015-08-08 DIAGNOSIS — B351 Tinea unguium: Secondary | ICD-10-CM | POA: Diagnosis not present

## 2015-08-10 DIAGNOSIS — M419 Scoliosis, unspecified: Secondary | ICD-10-CM | POA: Diagnosis not present

## 2015-08-10 DIAGNOSIS — N390 Urinary tract infection, site not specified: Secondary | ICD-10-CM | POA: Diagnosis not present

## 2015-08-10 DIAGNOSIS — N905 Atrophy of vulva: Secondary | ICD-10-CM | POA: Diagnosis not present

## 2015-08-10 DIAGNOSIS — R3915 Urgency of urination: Secondary | ICD-10-CM | POA: Diagnosis not present

## 2015-08-10 DIAGNOSIS — K5900 Constipation, unspecified: Secondary | ICD-10-CM | POA: Diagnosis not present

## 2015-08-22 DIAGNOSIS — E1142 Type 2 diabetes mellitus with diabetic polyneuropathy: Secondary | ICD-10-CM | POA: Diagnosis not present

## 2015-08-25 DIAGNOSIS — I10 Essential (primary) hypertension: Secondary | ICD-10-CM | POA: Diagnosis not present

## 2015-08-25 DIAGNOSIS — E1121 Type 2 diabetes mellitus with diabetic nephropathy: Secondary | ICD-10-CM | POA: Diagnosis not present

## 2015-09-01 DIAGNOSIS — K5900 Constipation, unspecified: Secondary | ICD-10-CM | POA: Diagnosis not present

## 2015-09-01 DIAGNOSIS — N390 Urinary tract infection, site not specified: Secondary | ICD-10-CM | POA: Diagnosis not present

## 2015-09-01 DIAGNOSIS — N3941 Urge incontinence: Secondary | ICD-10-CM | POA: Diagnosis not present

## 2015-09-05 DIAGNOSIS — H401112 Primary open-angle glaucoma, right eye, moderate stage: Secondary | ICD-10-CM | POA: Diagnosis not present

## 2015-09-05 DIAGNOSIS — H401123 Primary open-angle glaucoma, left eye, severe stage: Secondary | ICD-10-CM | POA: Diagnosis not present

## 2015-10-06 DIAGNOSIS — M353 Polymyalgia rheumatica: Secondary | ICD-10-CM | POA: Diagnosis not present

## 2015-10-06 DIAGNOSIS — N182 Chronic kidney disease, stage 2 (mild): Secondary | ICD-10-CM | POA: Diagnosis not present

## 2015-10-06 DIAGNOSIS — I10 Essential (primary) hypertension: Secondary | ICD-10-CM | POA: Diagnosis not present

## 2015-10-06 DIAGNOSIS — Z0000 Encounter for general adult medical examination without abnormal findings: Secondary | ICD-10-CM | POA: Diagnosis not present

## 2015-10-06 DIAGNOSIS — E1121 Type 2 diabetes mellitus with diabetic nephropathy: Secondary | ICD-10-CM | POA: Diagnosis not present

## 2015-10-06 DIAGNOSIS — Z1389 Encounter for screening for other disorder: Secondary | ICD-10-CM | POA: Diagnosis not present

## 2015-10-31 DIAGNOSIS — E1342 Other specified diabetes mellitus with diabetic polyneuropathy: Secondary | ICD-10-CM | POA: Diagnosis not present

## 2015-10-31 DIAGNOSIS — B351 Tinea unguium: Secondary | ICD-10-CM | POA: Diagnosis not present

## 2015-11-04 DIAGNOSIS — F319 Bipolar disorder, unspecified: Secondary | ICD-10-CM | POA: Diagnosis not present

## 2015-11-04 DIAGNOSIS — R4182 Altered mental status, unspecified: Secondary | ICD-10-CM | POA: Diagnosis not present

## 2015-11-04 DIAGNOSIS — R001 Bradycardia, unspecified: Secondary | ICD-10-CM | POA: Diagnosis not present

## 2015-11-04 DIAGNOSIS — Z794 Long term (current) use of insulin: Secondary | ICD-10-CM | POA: Diagnosis not present

## 2015-11-04 DIAGNOSIS — Z882 Allergy status to sulfonamides status: Secondary | ICD-10-CM | POA: Diagnosis not present

## 2015-11-04 DIAGNOSIS — Z23 Encounter for immunization: Secondary | ICD-10-CM | POA: Diagnosis not present

## 2015-11-04 DIAGNOSIS — Z79899 Other long term (current) drug therapy: Secondary | ICD-10-CM | POA: Diagnosis not present

## 2015-11-04 DIAGNOSIS — Z952 Presence of prosthetic heart valve: Secondary | ICD-10-CM | POA: Diagnosis not present

## 2015-11-04 DIAGNOSIS — E11649 Type 2 diabetes mellitus with hypoglycemia without coma: Secondary | ICD-10-CM | POA: Diagnosis not present

## 2015-11-04 DIAGNOSIS — E1122 Type 2 diabetes mellitus with diabetic chronic kidney disease: Secondary | ICD-10-CM | POA: Diagnosis not present

## 2015-11-04 DIAGNOSIS — Z7952 Long term (current) use of systemic steroids: Secondary | ICD-10-CM | POA: Diagnosis not present

## 2015-11-04 DIAGNOSIS — E784 Other hyperlipidemia: Secondary | ICD-10-CM | POA: Diagnosis not present

## 2015-11-04 DIAGNOSIS — Z7982 Long term (current) use of aspirin: Secondary | ICD-10-CM | POA: Diagnosis not present

## 2015-11-04 DIAGNOSIS — Z888 Allergy status to other drugs, medicaments and biological substances status: Secondary | ICD-10-CM | POA: Diagnosis not present

## 2015-11-04 DIAGNOSIS — I129 Hypertensive chronic kidney disease with stage 1 through stage 4 chronic kidney disease, or unspecified chronic kidney disease: Secondary | ICD-10-CM | POA: Diagnosis not present

## 2015-11-04 DIAGNOSIS — E785 Hyperlipidemia, unspecified: Secondary | ICD-10-CM | POA: Diagnosis not present

## 2015-11-04 DIAGNOSIS — M353 Polymyalgia rheumatica: Secondary | ICD-10-CM | POA: Diagnosis not present

## 2015-11-04 DIAGNOSIS — R031 Nonspecific low blood-pressure reading: Secondary | ICD-10-CM | POA: Diagnosis not present

## 2015-11-04 DIAGNOSIS — Z885 Allergy status to narcotic agent status: Secondary | ICD-10-CM | POA: Diagnosis not present

## 2015-11-04 DIAGNOSIS — K449 Diaphragmatic hernia without obstruction or gangrene: Secondary | ICD-10-CM | POA: Diagnosis not present

## 2015-11-04 DIAGNOSIS — Z780 Asymptomatic menopausal state: Secondary | ICD-10-CM | POA: Diagnosis not present

## 2015-11-04 DIAGNOSIS — N183 Chronic kidney disease, stage 3 (moderate): Secondary | ICD-10-CM | POA: Diagnosis not present

## 2015-11-04 DIAGNOSIS — R32 Unspecified urinary incontinence: Secondary | ICD-10-CM | POA: Diagnosis not present

## 2015-11-05 DIAGNOSIS — N183 Chronic kidney disease, stage 3 (moderate): Secondary | ICD-10-CM | POA: Diagnosis not present

## 2015-11-05 DIAGNOSIS — E784 Other hyperlipidemia: Secondary | ICD-10-CM | POA: Diagnosis not present

## 2015-11-05 DIAGNOSIS — E11649 Type 2 diabetes mellitus with hypoglycemia without coma: Secondary | ICD-10-CM | POA: Diagnosis not present

## 2015-11-05 DIAGNOSIS — E1122 Type 2 diabetes mellitus with diabetic chronic kidney disease: Secondary | ICD-10-CM | POA: Diagnosis not present

## 2015-11-14 DIAGNOSIS — E161 Other hypoglycemia: Secondary | ICD-10-CM | POA: Diagnosis not present

## 2015-11-16 DIAGNOSIS — M47816 Spondylosis without myelopathy or radiculopathy, lumbar region: Secondary | ICD-10-CM | POA: Diagnosis not present

## 2015-11-24 ENCOUNTER — Ambulatory Visit (INDEPENDENT_AMBULATORY_CARE_PROVIDER_SITE_OTHER): Payer: Medicare Other | Admitting: *Deleted

## 2015-11-24 ENCOUNTER — Inpatient Hospital Stay (HOSPITAL_COMMUNITY)
Admission: EM | Admit: 2015-11-24 | Discharge: 2015-11-26 | DRG: 291 | Disposition: A | Discharge status: Home Health | Payer: Medicare Other | Attending: Internal Medicine | Admitting: Internal Medicine

## 2015-11-24 ENCOUNTER — Inpatient Hospital Stay (HOSPITAL_COMMUNITY): Payer: Medicare Other

## 2015-11-24 ENCOUNTER — Emergency Department (HOSPITAL_COMMUNITY): Payer: Medicare Other

## 2015-11-24 ENCOUNTER — Encounter (HOSPITAL_COMMUNITY): Payer: Self-pay | Admitting: Emergency Medicine

## 2015-11-24 VITALS — BP 150/63 | HR 53 | Wt 125.0 lb

## 2015-11-24 DIAGNOSIS — E785 Hyperlipidemia, unspecified: Secondary | ICD-10-CM | POA: Diagnosis not present

## 2015-11-24 DIAGNOSIS — M1990 Unspecified osteoarthritis, unspecified site: Secondary | ICD-10-CM | POA: Diagnosis present

## 2015-11-24 DIAGNOSIS — R609 Edema, unspecified: Secondary | ICD-10-CM | POA: Diagnosis not present

## 2015-11-24 DIAGNOSIS — N183 Chronic kidney disease, stage 3 unspecified: Secondary | ICD-10-CM | POA: Diagnosis present

## 2015-11-24 DIAGNOSIS — E1165 Type 2 diabetes mellitus with hyperglycemia: Secondary | ICD-10-CM | POA: Diagnosis present

## 2015-11-24 DIAGNOSIS — K219 Gastro-esophageal reflux disease without esophagitis: Secondary | ICD-10-CM | POA: Diagnosis present

## 2015-11-24 DIAGNOSIS — I252 Old myocardial infarction: Secondary | ICD-10-CM

## 2015-11-24 DIAGNOSIS — Z79899 Other long term (current) drug therapy: Secondary | ICD-10-CM | POA: Diagnosis not present

## 2015-11-24 DIAGNOSIS — M353 Polymyalgia rheumatica: Secondary | ICD-10-CM | POA: Diagnosis present

## 2015-11-24 DIAGNOSIS — N390 Urinary tract infection, site not specified: Secondary | ICD-10-CM | POA: Diagnosis present

## 2015-11-24 DIAGNOSIS — E118 Type 2 diabetes mellitus with unspecified complications: Secondary | ICD-10-CM

## 2015-11-24 DIAGNOSIS — R079 Chest pain, unspecified: Secondary | ICD-10-CM | POA: Diagnosis present

## 2015-11-24 DIAGNOSIS — Z8673 Personal history of transient ischemic attack (TIA), and cerebral infarction without residual deficits: Secondary | ICD-10-CM

## 2015-11-24 DIAGNOSIS — B9620 Unspecified Escherichia coli [E. coli] as the cause of diseases classified elsewhere: Secondary | ICD-10-CM | POA: Diagnosis present

## 2015-11-24 DIAGNOSIS — I2510 Atherosclerotic heart disease of native coronary artery without angina pectoris: Secondary | ICD-10-CM | POA: Diagnosis present

## 2015-11-24 DIAGNOSIS — R0789 Other chest pain: Secondary | ICD-10-CM | POA: Diagnosis not present

## 2015-11-24 DIAGNOSIS — I5033 Acute on chronic diastolic (congestive) heart failure: Secondary | ICD-10-CM | POA: Diagnosis not present

## 2015-11-24 DIAGNOSIS — Z952 Presence of prosthetic heart valve: Secondary | ICD-10-CM

## 2015-11-24 DIAGNOSIS — Z794 Long term (current) use of insulin: Secondary | ICD-10-CM | POA: Diagnosis not present

## 2015-11-24 DIAGNOSIS — Z7902 Long term (current) use of antithrombotics/antiplatelets: Secondary | ICD-10-CM

## 2015-11-24 DIAGNOSIS — E1122 Type 2 diabetes mellitus with diabetic chronic kidney disease: Secondary | ICD-10-CM | POA: Diagnosis present

## 2015-11-24 DIAGNOSIS — Z7952 Long term (current) use of systemic steroids: Secondary | ICD-10-CM

## 2015-11-24 DIAGNOSIS — I130 Hypertensive heart and chronic kidney disease with heart failure and stage 1 through stage 4 chronic kidney disease, or unspecified chronic kidney disease: Principal | ICD-10-CM | POA: Diagnosis present

## 2015-11-24 DIAGNOSIS — R0602 Shortness of breath: Secondary | ICD-10-CM | POA: Diagnosis not present

## 2015-11-24 DIAGNOSIS — Z7982 Long term (current) use of aspirin: Secondary | ICD-10-CM | POA: Diagnosis not present

## 2015-11-24 DIAGNOSIS — IMO0002 Reserved for concepts with insufficient information to code with codable children: Secondary | ICD-10-CM | POA: Diagnosis present

## 2015-11-24 DIAGNOSIS — Z951 Presence of aortocoronary bypass graft: Secondary | ICD-10-CM | POA: Diagnosis not present

## 2015-11-24 DIAGNOSIS — R001 Bradycardia, unspecified: Secondary | ICD-10-CM | POA: Diagnosis present

## 2015-11-24 DIAGNOSIS — I10 Essential (primary) hypertension: Secondary | ICD-10-CM

## 2015-11-24 DIAGNOSIS — I509 Heart failure, unspecified: Secondary | ICD-10-CM | POA: Diagnosis not present

## 2015-11-24 DIAGNOSIS — M79605 Pain in left leg: Secondary | ICD-10-CM | POA: Diagnosis not present

## 2015-11-24 DIAGNOSIS — M79604 Pain in right leg: Secondary | ICD-10-CM | POA: Diagnosis not present

## 2015-11-24 DIAGNOSIS — R600 Localized edema: Secondary | ICD-10-CM | POA: Diagnosis not present

## 2015-11-24 LAB — URINALYSIS, ROUTINE W REFLEX MICROSCOPIC
Bilirubin Urine: NEGATIVE
GLUCOSE, UA: NEGATIVE mg/dL
Ketones, ur: NEGATIVE mg/dL
Nitrite: POSITIVE — AB
PROTEIN: NEGATIVE mg/dL
Specific Gravity, Urine: 1.015 (ref 1.005–1.030)
pH: 6 (ref 5.0–8.0)

## 2015-11-24 LAB — COMPREHENSIVE METABOLIC PANEL
ALBUMIN: 4 g/dL (ref 3.5–5.0)
ALT: 17 U/L (ref 14–54)
ANION GAP: 8 (ref 5–15)
AST: 22 U/L (ref 15–41)
Alkaline Phosphatase: 33 U/L — ABNORMAL LOW (ref 38–126)
BUN: 32 mg/dL — ABNORMAL HIGH (ref 6–20)
CALCIUM: 9.2 mg/dL (ref 8.9–10.3)
CHLORIDE: 105 mmol/L (ref 101–111)
CO2: 28 mmol/L (ref 22–32)
Creatinine, Ser: 1.68 mg/dL — ABNORMAL HIGH (ref 0.44–1.00)
GFR calc Af Amer: 32 mL/min — ABNORMAL LOW (ref >60)
GFR calc non Af Amer: 28 mL/min — ABNORMAL LOW (ref >60)
GLUCOSE: 86 mg/dL (ref 65–99)
POTASSIUM: 3.5 mmol/L (ref 3.5–5.1)
SODIUM: 141 mmol/L (ref 135–145)
Total Bilirubin: 0.6 mg/dL (ref 0.3–1.2)
Total Protein: 6.9 g/dL (ref 6.5–8.1)

## 2015-11-24 LAB — CBC WITH DIFFERENTIAL/PLATELET
BASOS PCT: 0 %
Basophils Absolute: 0 10*3/uL (ref 0.0–0.1)
EOS ABS: 0.1 10*3/uL (ref 0.0–0.7)
Eosinophils Relative: 1 %
HCT: 38.9 % (ref 36.0–46.0)
HEMOGLOBIN: 12.3 g/dL (ref 12.0–15.0)
Lymphocytes Relative: 35 %
Lymphs Abs: 2.9 10*3/uL (ref 0.7–4.0)
MCH: 30 pg (ref 26.0–34.0)
MCHC: 31.6 g/dL (ref 30.0–36.0)
MCV: 94.9 fL (ref 78.0–100.0)
MONOS PCT: 8 %
Monocytes Absolute: 0.7 10*3/uL (ref 0.1–1.0)
NEUTROS PCT: 56 %
Neutro Abs: 4.7 10*3/uL (ref 1.7–7.7)
PLATELETS: 120 10*3/uL — AB (ref 150–400)
RBC: 4.1 MIL/uL (ref 3.87–5.11)
RDW: 15.5 % (ref 11.5–15.5)
WBC: 8.4 10*3/uL (ref 4.0–10.5)

## 2015-11-24 LAB — GLUCOSE, CAPILLARY: Glucose-Capillary: 235 mg/dL — ABNORMAL HIGH (ref 65–99)

## 2015-11-24 LAB — TROPONIN I: TROPONIN I: 0.03 ng/mL — AB (ref <0.03)

## 2015-11-24 LAB — URINE MICROSCOPIC-ADD ON: RBC / HPF: NONE SEEN RBC/hpf (ref 0–5)

## 2015-11-24 LAB — TSH: TSH: 1.028 u[IU]/mL (ref 0.350–4.500)

## 2015-11-24 LAB — BRAIN NATRIURETIC PEPTIDE: B Natriuretic Peptide: 155 pg/mL — ABNORMAL HIGH (ref 0.0–100.0)

## 2015-11-24 LAB — VALPROIC ACID LEVEL: VALPROIC ACID LVL: 47 ug/mL — AB (ref 50.0–100.0)

## 2015-11-24 MED ORDER — LATANOPROST 0.005 % OP SOLN
1.0000 [drp] | Freq: Every day | OPHTHALMIC | Status: DC
  Administered 2015-11-24 – 2015-11-25 (×2): 1 [drp] via OPHTHALMIC
  Filled 2015-11-24: qty 2.5

## 2015-11-24 MED ORDER — CLOPIDOGREL BISULFATE 75 MG PO TABS
75.0000 mg | ORAL_TABLET | Freq: Every day | ORAL | Status: DC
  Administered 2015-11-25 – 2015-11-26 (×2): 75 mg via ORAL
  Filled 2015-11-24 (×2): qty 1

## 2015-11-24 MED ORDER — SODIUM CHLORIDE 0.9% FLUSH
3.0000 mL | INTRAVENOUS | Status: DC | PRN
Start: 2015-11-24 — End: 2015-11-26

## 2015-11-24 MED ORDER — ACETAMINOPHEN 325 MG PO TABS: 650.0000 mg | ORAL_TABLET | ORAL | Status: DC | PRN

## 2015-11-24 MED ORDER — SODIUM CHLORIDE 0.9% FLUSH
3.0000 mL | Freq: Two times a day (BID) | INTRAVENOUS | Status: DC
  Administered 2015-11-24 – 2015-11-26 (×4): 3 mL via INTRAVENOUS

## 2015-11-24 MED ORDER — DEXTROSE 5 % IV SOLN
1.0000 g | INTRAVENOUS | Status: DC
  Administered 2015-11-25: 1 g via INTRAVENOUS
  Filled 2015-11-24 (×3): qty 10

## 2015-11-24 MED ORDER — INSULIN GLARGINE 100 UNIT/ML ~~LOC~~ SOLN
18.0000 [IU] | Freq: Every day | SUBCUTANEOUS | Status: DC
  Administered 2015-11-24 – 2015-11-25 (×2): 18 [IU] via SUBCUTANEOUS
  Filled 2015-11-24 (×3): qty 0.18

## 2015-11-24 MED ORDER — FUROSEMIDE 10 MG/ML IJ SOLN
40.0000 mg | Freq: Two times a day (BID) | INTRAMUSCULAR | Status: DC
  Administered 2015-11-24 – 2015-11-26 (×4): 40 mg via INTRAVENOUS
  Filled 2015-11-24 (×4): qty 4

## 2015-11-24 MED ORDER — PANTOPRAZOLE SODIUM 40 MG PO TBEC
40.0000 mg | DELAYED_RELEASE_TABLET | Freq: Every day | ORAL | Status: DC
  Administered 2015-11-25 – 2015-11-26 (×2): 40 mg via ORAL
  Filled 2015-11-24 (×2): qty 1

## 2015-11-24 MED ORDER — AMLODIPINE BESYLATE 5 MG PO TABS
10.0000 mg | ORAL_TABLET | Freq: Every day | ORAL | Status: DC
  Administered 2015-11-25 – 2015-11-26 (×2): 10 mg via ORAL
  Filled 2015-11-24 (×2): qty 2

## 2015-11-24 MED ORDER — INSULIN ASPART 100 UNIT/ML ~~LOC~~ SOLN
0.0000 [IU] | Freq: Every day | SUBCUTANEOUS | Status: DC
  Administered 2015-11-24 – 2015-11-25 (×2): 2 [IU] via SUBCUTANEOUS

## 2015-11-24 MED ORDER — DIVALPROEX SODIUM ER 500 MG PO TB24
500.0000 mg | ORAL_TABLET | Freq: Every day | ORAL | Status: DC
  Administered 2015-11-25 – 2015-11-26 (×2): 500 mg via ORAL
  Filled 2015-11-24 (×2): qty 1

## 2015-11-24 MED ORDER — MIRABEGRON ER 25 MG PO TB24
25.0000 mg | ORAL_TABLET | Freq: Two times a day (BID) | ORAL | Status: DC
  Administered 2015-11-24 – 2015-11-26 (×4): 25 mg via ORAL
  Filled 2015-11-24 (×4): qty 1

## 2015-11-24 MED ORDER — INSULIN ASPART 100 UNIT/ML ~~LOC~~ SOLN
0.0000 [IU] | Freq: Three times a day (TID) | SUBCUTANEOUS | Status: DC
  Administered 2015-11-25: 3 [IU] via SUBCUTANEOUS
  Administered 2015-11-25: 2 [IU] via SUBCUTANEOUS
  Administered 2015-11-26: 1 [IU] via SUBCUTANEOUS
  Administered 2015-11-26: 3 [IU] via SUBCUTANEOUS

## 2015-11-24 MED ORDER — TIMOLOL MALEATE 0.5 % OP SOLN
1.0000 [drp] | Freq: Every day | OPHTHALMIC | Status: DC
  Administered 2015-11-25 – 2015-11-26 (×2): 1 [drp] via OPHTHALMIC
  Filled 2015-11-24: qty 5

## 2015-11-24 MED ORDER — SODIUM CHLORIDE 0.9 % IV SOLN
250.0000 mL | INTRAVENOUS | Status: DC | PRN
  Administered 2015-11-24: 250 mL via INTRAVENOUS

## 2015-11-24 MED ORDER — ISOSORBIDE MONONITRATE ER 60 MG PO TB24
60.0000 mg | ORAL_TABLET | Freq: Every day | ORAL | Status: DC
  Administered 2015-11-25 – 2015-11-26 (×2): 60 mg via ORAL
  Filled 2015-11-24 (×2): qty 1

## 2015-11-24 MED ORDER — ENOXAPARIN SODIUM 30 MG/0.3ML ~~LOC~~ SOLN
30.0000 mg | SUBCUTANEOUS | Status: DC
  Administered 2015-11-24 – 2015-11-25 (×2): 30 mg via SUBCUTANEOUS
  Filled 2015-11-24 (×2): qty 0.3

## 2015-11-24 MED ORDER — ASPIRIN EC 81 MG PO TBEC
81.0000 mg | DELAYED_RELEASE_TABLET | Freq: Every day | ORAL | Status: DC
  Administered 2015-11-25 – 2015-11-26 (×2): 81 mg via ORAL
  Filled 2015-11-24 (×2): qty 1

## 2015-11-24 MED ORDER — ONDANSETRON HCL 4 MG/2ML IJ SOLN: 4.0000 mg | Freq: Four times a day (QID) | INTRAMUSCULAR | Status: DC | PRN

## 2015-11-24 MED ORDER — INSULIN ASPART 100 UNIT/ML ~~LOC~~ SOLN
7.0000 [IU] | Freq: Three times a day (TID) | SUBCUTANEOUS | Status: DC
  Administered 2015-11-25 – 2015-11-26 (×5): 7 [IU] via SUBCUTANEOUS

## 2015-11-24 MED ORDER — POTASSIUM CHLORIDE CRYS ER 20 MEQ PO TBCR
20.0000 meq | EXTENDED_RELEASE_TABLET | Freq: Every day | ORAL | Status: DC
  Administered 2015-11-25 – 2015-11-26 (×2): 20 meq via ORAL
  Filled 2015-11-24 (×2): qty 1

## 2015-11-24 MED ORDER — PREDNISONE 10 MG PO TABS
5.0000 mg | ORAL_TABLET | Freq: Every day | ORAL | Status: DC
  Administered 2015-11-25 – 2015-11-26 (×2): 5 mg via ORAL
  Filled 2015-11-24 (×3): qty 1

## 2015-11-24 MED ORDER — HYDROCODONE-ACETAMINOPHEN 10-325 MG PO TABS
1.0000 | ORAL_TABLET | Freq: Four times a day (QID) | ORAL | Status: DC | PRN
  Administered 2015-11-24 – 2015-11-26 (×4): 1 via ORAL
  Filled 2015-11-24 (×4): qty 1

## 2015-11-24 MED ORDER — ROSUVASTATIN CALCIUM 20 MG PO TABS
20.0000 mg | ORAL_TABLET | Freq: Every day | ORAL | Status: DC
  Administered 2015-11-24 – 2015-11-25 (×2): 20 mg via ORAL
  Filled 2015-11-24 (×2): qty 1

## 2015-11-24 NOTE — ED Notes (Signed)
CRITICAL VALUE ALERT  Critical value received:  Troponin 0.03  Date of notification:  11/24/15  Time of notification:  [id number]  Critical value read back:Yes.    Nurse who received alert:  Parthenia Ames  MD notified (1st page):  Dr. Lita Mains  Time of first page:  1153  MD notified (2nd page):  Time of second page:  Responding MD:  Dr. Lita Mains  Time MD responded:  1153

## 2015-11-24 NOTE — ED Triage Notes (Addendum)
Pt reports chest pain, generalized weakness, and bilateral lower extremity swelling x2 weeks. Pt went to spinal doctor and was told to come to ED. Pt reports intermittent chest pain and shortness of breath.

## 2015-11-24 NOTE — ED Notes (Signed)
Pt back in room from xray 

## 2015-11-24 NOTE — ED Provider Notes (Signed)
[facility] DEPT Provider Note   CSN: [id number] Arrival date & time: 11/24/15  1052  By signing my name below, I, Higinio Plan, attest that this documentation has been prepared under the direction and in the presence of Julianne Rice, MD . Electronically Signed: Higinio Plan, Scribe. 11/24/2015. 11:14 AM.  History   Chief Complaint Chief Complaint  Patient presents with  . Chest Pain   The history is provided by the patient and a relative. No language interpreter was used.   HPI Comments: Sharon Hubbard is a 82 y.o. female with PMHx of polymyalgia rheumatica, MI and stroke, who presents to the Emergency Department complaining of gradually worsening, bilateral leg swelling that began 2 weeks ago. Pt reports she recently visited her spinal doctor for chronic lower back pain and was told to follow-up with her cardiologist for worsening bilateral leg swelling. She states she visited her cardiologist this morning but he was not there so she was given the choice to wait for him or visit the ED. Pt's daughter reports she decided to bring pt to the ED today for symptoms of episodic "chest pressure" and worsening, lightheadedness and confusion. Pt denies chest pain now in the ED, shortness of breath, fever and chills.   Past Medical History:  Diagnosis Date  . Anemia   . Aortic stenosis    very mild, echo 12/2010  . Blood transfusion   . Bradycardia, sinus   . CAD (coronary artery disease)    Interventions in the past  . Cancer ([place])    ""had hysterectomy for a 6 showing cancer; think that's pretty strong  . Complication of anesthesia    "felt like I was smothering once with mask on my face"  . Diabetes mellitus   . Dyslipidemia   . Ejection fraction    EF 65%, echo, April, 2012, aortic valve sclerosis with very slight gradient  . Femoral bruit    01/2011 hosp, but no pseudoaneurysm or AV fistula  . GERD (gastroesophageal reflux disease)   . Groin hematoma    April, 2011  .  Hypertension   . Myocardial infarction 2011; 08/2010  . Osteoarthritis    arthroscopic surgery right knee February, 2012  . Pancreas cyst    The patient has multiple pancreatic cysts.  This is followed at [facility]         . Polymyalgia rheumatica ([place])   . Preoperative evaluation to rule out surgical contraindication    Patient needs back surgery by Dr. Carloyn Manner, May, 2012                . RBBB (right bundle branch block)    old  . Renal insufficiency   . Schatzki's ring   . Shortness of breath on exertion   . Spinal stenosis    history of surgery for this  . Stroke [facility]) 01/18/11   "I've had 2 TIA's"    Patient Active Problem List   Diagnosis Date Noted  . Chest pain 11/24/2015  . Bilateral carotid artery disease ([place]) 10/09/2014  . Hx of CABG 10/09/2014  . S/P AVR 08/22/2014  . Chronic diastolic CHF (congestive heart failure) ([place]) 06/13/2014  . CKD (chronic kidney disease) stage 3, GFR 30-59 ml/min 03/09/2014  . Itching 07/29/2013  . Edema 07/29/2013  . Femoral artery hematoma complicating cardiac catheterization 05/25/2013  . S/P evacuation of hematoma 04/13/2013  . Thrombocytopenia ([place]) 03/29/2013  . Aortic stenosis   . Pancreas cyst   . CAD (coronary artery disease)   .  RBBB (right bundle branch block)   . Hypertension   . Type II diabetes mellitus, uncontrolled ([place])   . Hyperlipidemia   . GERD (gastroesophageal reflux disease)   . Polymyalgia rheumatica ([place])   . Bradycardia   . Ejection fraction     Past Surgical History:  Procedure Laterality Date  . ABDOMINAL HYSTERECTOMY  ~ 1974  . AORTIC VALVE REPLACEMENT N/A 08/22/2014   Procedure: AORTIC VALVE REPLACEMENT (AVR);  Surgeon: Melrose Nakayama, MD;  Location: [place];  Service: Open Heart Surgery;  Laterality: N/A;  . APPENDECTOMY    . BACK SURGERY    . CARDIAC CATHETERIZATION  01/18/11  . CARDIAC CATHETERIZATION N/A 08/16/2014   Procedure: Left Heart Cath and Coronary Angiography;  Surgeon: Leonie Man,  MD;  Location: [place] CV LAB;  Service: Cardiovascular;  Laterality: N/A;  . CARDIAC CATHETERIZATION N/A 08/16/2014   Procedure: Coronary Stent Intervention;  Surgeon: Leonie Man, MD;  Location: [place] CV LAB;  Service: Cardiovascular;  Laterality: N/A;  . CARDIAC CATHETERIZATION N/A 08/17/2014   Procedure: Coronary/Graft Atherectomy;  Surgeon: Burnell Blanks, MD;  Location: [facility] CV LAB;  Service: Cardiovascular;  Laterality: N/A;  . CHOLECYSTECTOMY  1987  . CORONARY ANGIOGRAM  01/18/2011   Procedure: CORONARY ANGIOGRAM;  Surgeon: Larey Dresser, MD;  Location: [facility] CATH LAB;  Service: Cardiovascular;;  . CORONARY ANGIOPLASTY WITH STENT PLACEMENT  2011   "in [place]"  . CORONARY ANGIOPLASTY WITH STENT PLACEMENT  08/2010; 03/25/2013  . CORONARY ARTERY BYPASS GRAFT N/A 08/22/2014   Procedure: CORONARY ARTERY BYPASS GRAFTING (CABG), ON PUMP, TIMES ONE, USING RIGHT INTERNAL MAMMARY ARTERY;  Surgeon: Melrose Nakayama, MD;  Location: [place];  Service: Open Heart Surgery;  Laterality: N/A;  . DILATION AND CURETTAGE OF UTERUS    . FEMORAL ARTERY EXPLORATION Right 03/26/2013   Procedure: FEMORAL ARTERY EXPLORATION WITH SUTURE REPAIR; EVACUATION OF HEMATOMA;  Surgeon: Mal Misty, MD;  Location: [facility];  Service: Vascular;  Laterality: Right;  . KNEE ARTHROSCOPY  02/2010   right knee; chrondoplasty medial femoral condyle;  Partial medial meniscectomy.   Marland Kitchen LEFT AND RIGHT HEART CATHETERIZATION WITH CORONARY ANGIOGRAM N/A 03/25/2013   Procedure: LEFT AND RIGHT HEART CATHETERIZATION WITH CORONARY ANGIOGRAM;  Surgeon: Burnell Blanks, MD;  Location[facility] CATH LAB;  Service: Cardiovascular;  Laterality: N/A;  . LUMBAR SPINE SURGERY  ~ 1977; ~ 2010   "2 hugh ruptured discs; I was paralyzed first OR; 2nd OR by Dr. Carloyn Manner, don't know what for"  . TEE WITHOUT CARDIOVERSION N/A 08/22/2014   Procedure: TRANSESOPHAGEAL ECHOCARDIOGRAM (TEE);  Surgeon: Melrose Nakayama, MD;  Location: [facility];   Service: Open Heart Surgery;  Laterality: N/A;  . TUBAL LIGATION  1970's    OB History    No data available     Home Medications    Prior to Admission medications   Medication Sig Start Date End Date Taking? Authorizing Provider  amLODipine (NORVASC) 10 MG tablet Take 10 mg by mouth daily.      Historical Provider, MD  aspirin 81 MG tablet Take 81 mg by mouth daily.      Historical Provider, MD  Cholecalciferol (VITAMIN D-3) 1000 units CAPS Take 1 capsule by mouth daily.    Historical Provider, MD  clopidogrel (PLAVIX) 75 MG tablet Take 1 tablet (75 mg total) by mouth daily. 07/18/14   Carlena Bjornstad, MD  divalproex (DEPAKOTE) 500 MG 24 hr tablet Take 500 mg by mouth daily.  Historical Provider, MD  furosemide (LASIX) 40 MG tablet Take 40 mg by mouth every morning. & one at 4:00 pm    Historical Provider, MD  HYDROcodone-acetaminophen (NORCO) 10-325 MG tablet Take 1 tablet by mouth every 6 (six) hours as needed. 06/05/15   Historical Provider, MD  Insulin Glargine (LANTUS SOLOSTAR) 100 UNIT/ML Solostar Pen Inject 12 Units into the skin at bedtime.    Historical Provider, MD  insulin lispro (HUMALOG KWIKPEN) 100 UNIT/ML KiwkPen Inject 10 Units into the skin 3 (three) times daily before meals.     Historical Provider, MD  isosorbide mononitrate (IMDUR) 60 MG 24 hr tablet Take 90 mg by mouth daily.     Historical Provider, MD  latanoprost (XALATAN) 0.005 % ophthalmic solution Place 1 drop into both eyes at bedtime.      Historical Provider, MD  lisinopril (PRINIVIL,ZESTRIL) 10 MG tablet Take 10 mg by mouth daily.    Historical Provider, MD  mirabegron ER (MYRBETRIQ) 25 MG TB24 tablet Take 25 mg by mouth 2 (two) times daily.     Historical Provider, MD  pantoprazole (PROTONIX) 40 MG tablet Take 40 mg by mouth daily.    Historical Provider, MD  potassium chloride SA (K-DUR,KLOR-CON) 20 MEQ tablet Take 20 mEq by mouth daily.    Historical Provider, MD  predniSONE (DELTASONE) 5 MG tablet Take  2.5-5 mg by mouth 2 (two) times daily. as directed 06/06/15   Historical Provider, MD  rosuvastatin (CRESTOR) 20 MG tablet Take 20 mg by mouth daily.    Historical Provider, MD  timolol (TIMOPTIC) 0.5 % ophthalmic solution Place 1 drop into both eyes daily.     Historical Provider, MD    Family History Family History  Problem Relation Age of Onset  . Uterine cancer Mother 34  . Other Father 61    cervical fracture    Social History Social History  Substance Use Topics  . Smoking status: Never Smoker  . Smokeless tobacco: Never Used  . Alcohol use No     Allergies   Clarithromycin; Codeine; Fentanyl; Morphine; Statins; Sulfa antibiotics; and Atorvastatin   Review of Systems Review of Systems  Constitutional: Negative for chills and fever.  Respiratory: Negative for shortness of breath.   Cardiovascular: Positive for leg swelling. Negative for chest pain and palpitations.  Gastrointestinal: Negative for abdominal pain, diarrhea, nausea and vomiting.  Genitourinary: Negative for dysuria, flank pain and hematuria.  Musculoskeletal: Positive for back pain and myalgias. Negative for neck pain and neck stiffness.  Skin: Negative for rash and wound.  Neurological: Positive for dizziness, weakness (generalized) and light-headedness. Negative for numbness and headaches.  Psychiatric/Behavioral: Positive for confusion.  All other systems reviewed and are negative.  Physical Exam Updated Vital Signs BP 165/79 (BP Location: Left Arm)   Pulse (!) 58   Temp 98.2 F (36.8 C) (Oral)   Resp 13   Ht 5\' 8"  (1.727 m)   Wt 125 lb (56.7 kg)   SpO2 100%   BMI 19.01 kg/m   Physical Exam  Constitutional: She is oriented to person, place, and time. She appears well-developed and well-nourished. No distress.  HENT:  Head: Normocephalic and atraumatic.  Mouth/Throat: Oropharynx is clear and moist. No oropharyngeal exudate.  Eyes: EOM are normal. Pupils are equal, round, and reactive to  light.  Neck: Normal range of motion. Neck supple. No JVD present.  Cardiovascular: Normal rate and regular rhythm.  Exam reveals no gallop and no friction rub.   No murmur heard.  Pulmonary/Chest: Effort normal and breath sounds normal. No respiratory distress. She has no wheezes. She has no rales. She exhibits no tenderness.  Abdominal: Soft. Bowel sounds are normal. There is no tenderness. There is no rebound and no guarding.  Musculoskeletal: Normal range of motion. She exhibits edema. She exhibits no tenderness.  3+ bilateral lower extremity pitting edema. 2+ distal pulses in all extremities. Patient with mild diffuse midline thoracic and lumbar tenderness. No CVA tenderness.  Neurological: She is alert and oriented to person, place, and time.  5/5 motor in all extremities. Sensation is fully intact.  Skin: Skin is warm and dry. Capillary refill takes less than 2 seconds. No rash noted. No erythema.  Psychiatric: She has a normal mood and affect. Her behavior is normal.  Nursing note and vitals reviewed.   ED Treatments / Results  Labs (all labs ordered are listed, but only abnormal results are displayed) Labs Reviewed  CBC WITH DIFFERENTIAL/PLATELET - Abnormal; Notable for the following:       Result Value   Platelets 120 (*)    All other components within normal limits  COMPREHENSIVE METABOLIC PANEL - Abnormal; Notable for the following:    BUN 32 (*)    Creatinine, Ser 1.68 (*)    Alkaline Phosphatase 33 (*)    GFR calc non Af Amer 28 (*)    GFR calc Af Amer 32 (*)    All other components within normal limits  BRAIN NATRIURETIC PEPTIDE - Abnormal; Notable for the following:    B Natriuretic Peptide 155.0 (*)    All other components within normal limits  TROPONIN I - Abnormal; Notable for the following:    Troponin I 0.03 (*)    All other components within normal limits  VALPROIC ACID LEVEL - Abnormal; Notable for the following:    Valproic Acid Lvl 47 (*)    All other  components within normal limits  URINALYSIS, ROUTINE W REFLEX MICROSCOPIC (NOT AT [facility])    EKG  EKG Interpretation  Date/Time:  Friday November 24 2015 11:00:03 EDT Ventricular Rate:  55 PR Interval:    QRS Duration: 138 QT Interval:  489 QTC Calculation: 468 R Axis:   30 Text Interpretation:  Sinus rhythm Right bundle branch block Confirmed by Lita Mains  MD, Raydel Hosick ([id number]) on 11/24/2015 11:59:10 AM       Radiology Dg Chest 2 View  Result Date: 11/24/2015 CLINICAL DATA:  Shortness of breath. Lower extremity edema for 3 weeks EXAM: CHEST  2 VIEW COMPARISON:  November 04, 2015 FINDINGS: There is slight scarring in the right upper lobe. There is no edema or consolidation. Heart is upper normal in size with pulmonary vascularity within normal limits. There is atherosclerotic calcification in the aorta. No adenopathy. Patient is status post coronary artery bypass grafting and aortic valve replacement. There are foci of calcific tendinosis in each shoulder. There is calcification in the carotid arteries bilaterally. IMPRESSION: Mild scarring right upper lobe. No edema or consolidation. Aortic atherosclerosis as well as bilateral carotid artery calcification. Patient is status post coronary artery bypass grafting and aortic valve replacement. Electronically Signed   By: Lowella Grip III M.D.   On: 11/24/2015 12:00    Procedures Procedures (including critical care time)  Medications Ordered in ED Medications - No data to display  DIAGNOSTIC STUDIES:  Oxygen Saturation is 100% on RA, normal by my interpretation.    COORDINATION OF CARE:  11:05 AM Discussed treatment plan with pt at bedside and pt agreed  to plan.  Initial Impression / Assessment and Plan / ED Course  I have reviewed the triage vital signs and the nursing notes.  Pertinent labs & imaging results that were available during my care of the patient were reviewed by me and considered in my medical decision making (see  chart for details).  Clinical Course   Mildly worse renal function. Patient has troponin of 0.03. Continues to have no chest pain. 10 history of coronary artery disease we will admit to hospitalist for rule out.    I personally performed the services described in this documentation, which was scribed in my presence. The recorded information has been reviewed and is accurate.   Final Clinical Impressions(s) / ED Diagnoses   Final diagnoses:  Chest pain, unspecified type  Peripheral edema    New Prescriptions New Prescriptions   No medications on file     Julianne Rice, MD 11/24/15 1309

## 2015-11-24 NOTE — ED Notes (Signed)
Reported called to Romie Minus, RN at this time for room 334

## 2015-11-24 NOTE — H&P (Addendum)
History and Physical    Sharon Hubbard [id number] DOB: 1933-11-05 DOA: 11/24/2015  PCP: Neale Burly, MD  Patient coming from: home  Chief Complaint: swelling in legs, chest pain  HPI: Sharon Hubbard is a 82 y.o. female with medical history significant of chronic diastolic congestive heart failure, CKD stage III, coronary artery disease who presents to the hospital with worsening lower extremity edema. Patient reports that for the past 2-3 weeks, she has noted worsening lower extremity edema despite taking her Lasix regularly. She denies any excess salt intake. She says that her weight has been relatively stable. She's also had intermittent chest pain for the past 2 weeks. Episodes last approximately 10 minutes and occur on exertion and at rest. It's described as a dull discomfort located in the center chest which is nonradiating. She has not had any associated nausea, shortness of breath, palpitations. She does not have any orthopnea or dyspnea on exertion.  ED Course: EKG in the emergency room was found to be unremarkable. Troponin was mildly positive at 0.03. Chest x-ray did not show any evidence of interstitial edema. She's been referred for admission  Review of Systems: As per HPI otherwise 10 point review of systems negative.    Past Medical History:  Diagnosis Date  . Anemia   . Aortic stenosis    very mild, echo 12/2010  . Blood transfusion   . Bradycardia, sinus   . CAD (coronary artery disease)    Interventions in the past  . Cancer ([place])    ""had hysterectomy for a 6 showing cancer; think that's pretty strong  . Complication of anesthesia    "felt like I was smothering once with mask on my face"  . Diabetes mellitus   . Dyslipidemia   . Ejection fraction    EF 65%, echo, April, 2012, aortic valve sclerosis with very slight gradient  . Femoral bruit    01/2011 hosp, but no pseudoaneurysm or AV fistula  . GERD (gastroesophageal reflux disease)   . Groin  hematoma    April, 2011  . Hypertension   . Myocardial infarction 2011; 08/2010  . Osteoarthritis    arthroscopic surgery right knee February, 2012  . Pancreas cyst    The patient has multiple pancreatic cysts.  This is followed at [facility]         . Polymyalgia rheumatica ([place])   . Preoperative evaluation to rule out surgical contraindication    Patient needs back surgery by Dr. Carloyn Manner, May, 2012                . RBBB (right bundle branch block)    old  . Renal insufficiency   . Schatzki's ring   . Shortness of breath on exertion   . Spinal stenosis    history of surgery for this  . Stroke ([place]) 01/18/11   "I've had 2 TIA's"    Past Surgical History:  Procedure Laterality Date  . ABDOMINAL HYSTERECTOMY  ~ 1974  . AORTIC VALVE REPLACEMENT N/A 08/22/2014   Procedure: AORTIC VALVE REPLACEMENT (AVR);  Surgeon: Melrose Nakayama, MD;  Location: [place];  Service: Open Heart Surgery;  Laterality: N/A;  . APPENDECTOMY    . BACK SURGERY    . CARDIAC CATHETERIZATION  01/18/11  . CARDIAC CATHETERIZATION N/A 08/16/2014   Procedure: Left Heart Cath and Coronary Angiography;  Surgeon: Leonie Man, MD;  Location: [place] CV LAB;  Service: Cardiovascular;  Laterality: N/A;  . CARDIAC CATHETERIZATION N/A 08/16/2014  Procedure: Coronary Stent Intervention;  Surgeon: Leonie Man, MD;  Location: [place] CV LAB;  Service: Cardiovascular;  Laterality: N/A;  . CARDIAC CATHETERIZATION N/A 08/17/2014   Procedure: Coronary/Graft Atherectomy;  Surgeon: Burnell Blanks, MD;  Location: [place] CV LAB;  Service: Cardiovascular;  Laterality: N/A;  . CHOLECYSTECTOMY  1987  . CORONARY ANGIOGRAM  01/18/2011   Procedure: CORONARY ANGIOGRAM;  Surgeon: Larey Dresser, MD;  Location: [facility] CATH LAB;  Service: Cardiovascular;;  . CORONARY ANGIOPLASTY WITH STENT PLACEMENT  2011   "in [place]"  . CORONARY ANGIOPLASTY WITH STENT PLACEMENT  08/2010; 03/25/2013  . CORONARY ARTERY BYPASS GRAFT N/A  08/22/2014   Procedure: CORONARY ARTERY BYPASS GRAFTING (CABG), ON PUMP, TIMES ONE, USING RIGHT INTERNAL MAMMARY ARTERY;  Surgeon: Melrose Nakayama, MD;  Location: [place];  Service: Open Heart Surgery;  Laterality: N/A;  . DILATION AND CURETTAGE OF UTERUS    . FEMORAL ARTERY EXPLORATION Right 03/26/2013   Procedure: FEMORAL ARTERY EXPLORATION WITH SUTURE REPAIR; EVACUATION OF HEMATOMA;  Surgeon: Mal Misty, MD;  Location: [place];  Service: Vascular;  Laterality: Right;  . KNEE ARTHROSCOPY  02/2010   right knee; chrondoplasty medial femoral condyle;  Partial medial meniscectomy.   Marland Kitchen LEFT AND RIGHT HEART CATHETERIZATION WITH CORONARY ANGIOGRAM N/A 03/25/2013   Procedure: LEFT AND RIGHT HEART CATHETERIZATION WITH CORONARY ANGIOGRAM;  Surgeon: Burnell Blanks, MD;  Location: [facility] CATH LAB;  Service: Cardiovascular;  Laterality: N/A;  . LUMBAR SPINE SURGERY  ~ 1977; ~ 2010   "2 hugh ruptured discs; I was paralyzed first OR; 2nd OR by Dr. Carloyn Manner, don't know what for"  . TEE WITHOUT CARDIOVERSION N/A 08/22/2014   Procedure: TRANSESOPHAGEAL ECHOCARDIOGRAM (TEE);  Surgeon: Melrose Nakayama, MD;  Location: [place];  Service: Open Heart Surgery;  Laterality: N/A;  . TUBAL LIGATION  1970's     reports that she has never smoked. She has never used smokeless tobacco. She reports that she does not drink alcohol or use drugs.  Allergies  Allergen Reactions  . Clarithromycin     REACTION: Vomiting and diarrhea.  . Codeine Nausea And Vomiting  . Fentanyl Other (See Comments)  . Morphine Nausea And Vomiting  . Statins Other (See Comments)  . Sulfa Antibiotics   . Atorvastatin Other (See Comments)    Made her knees buckle and muscles ache    Family History  Problem Relation Age of Onset  . Uterine cancer Mother 66  . Other Father 64    cervical fracture     Prior to Admission medications   Medication Sig Start Date End Date Taking? Authorizing Provider  amLODipine (NORVASC) 10 MG tablet Take 10  mg by mouth daily.     Yes Historical Provider, MD  aspirin 81 MG tablet Take 81 mg by mouth daily.     Yes Historical Provider, MD  Calcium Carbonate-Vitamin D (CALCIUM-VITAMIN D) 500-200 MG-UNIT tablet Take 1 tablet by mouth daily.   Yes Historical Provider, MD  Cholecalciferol (VITAMIN D-3) 1000 units CAPS Take 1 capsule by mouth daily.   Yes Historical Provider, MD  clopidogrel (PLAVIX) 75 MG tablet Take 1 tablet (75 mg total) by mouth daily. 07/18/14  Yes Carlena Bjornstad, MD  divalproex (DEPAKOTE) 500 MG 24 hr tablet Take 500 mg by mouth daily.     Yes Historical Provider, MD  furosemide (LASIX) 40 MG tablet Take 40 mg by mouth every morning. & one at 4:00 pm   Yes Historical Provider, MD  HYDROcodone-acetaminophen (NORCO) 10-325 MG tablet Take 1 tablet by mouth every 6 (six) hours as needed. 06/05/15  Yes Historical Provider, MD  Insulin Glargine (LANTUS SOLOSTAR) 100 UNIT/ML Solostar Pen Inject 18 Units into the skin at bedtime.    Yes Historical Provider, MD  insulin lispro (HUMALOG KWIKPEN) 100 UNIT/ML KiwkPen Inject 7 Units into the skin 3 (three) times daily before meals.    Yes Historical Provider, MD  isosorbide mononitrate (IMDUR) 60 MG 24 hr tablet Take 60 mg by mouth daily.    Yes Historical Provider, MD  latanoprost (XALATAN) 0.005 % ophthalmic solution Place 1 drop into both eyes at bedtime.     Yes Historical Provider, MD  lisinopril (PRINIVIL,ZESTRIL) 10 MG tablet Take 10 mg by mouth daily.   Yes Historical Provider, MD  mirabegron ER (MYRBETRIQ) 25 MG TB24 tablet Take 25 mg by mouth 2 (two) times daily.    Yes Historical Provider, MD  pantoprazole (PROTONIX) 40 MG tablet Take 40 mg by mouth daily.   Yes Historical Provider, MD  potassium chloride SA (K-DUR,KLOR-CON) 20 MEQ tablet Take 20 mEq by mouth daily.   Yes Historical Provider, MD  predniSONE (DELTASONE) 5 MG tablet Take 5 mg by mouth daily. as directed 06/06/15  Yes Historical Provider, MD  rosuvastatin (CRESTOR) 20 MG tablet  Take 20 mg by mouth daily.   Yes Historical Provider, MD  timolol (TIMOPTIC) 0.5 % ophthalmic solution Place 1 drop into both eyes daily.    Yes Historical Provider, MD    Physical Exam: Vitals:   11/24/15 1300 11/24/15 1330 11/24/15 1400 11/24/15 1448  BP: 151/56 145/68 157/63 (!) 137/52  Pulse: (!) 55 (!) 52 (!) 58 61  Resp: 17 15 14 16   Temp:   98.2 F (36.8 C) 98.2 F (36.8 C)  TempSrc:   Oral Oral  SpO2: 100% 100% 100% 97%  Weight:    58.9 kg (129 lb 13.6 oz)  Height:    5\' 3"  (1.6 m)      Constitutional: NAD, calm, comfortable Vitals:   11/24/15 1300 11/24/15 1330 11/24/15 1400 11/24/15 1448  BP: 151/56 145/68 157/63 (!) 137/52  Pulse: (!) 55 (!) 52 (!) 58 61  Resp: 17 15 14 16   Temp:   98.2 F (36.8 C) 98.2 F (36.8 C)  TempSrc:   Oral Oral  SpO2: 100% 100% 100% 97%  Weight:    58.9 kg (129 lb 13.6 oz)  Height:    5\' 3"  (1.6 m)   Eyes: PERRL, lids and conjunctivae normal ENMT: Mucous membranes are moist. Posterior pharynx clear of any exudate or lesions.Normal dentition.  Neck: normal, supple, no masses, no thyromegaly Respiratory: clear to auscultation bilaterally, no wheezing, no crackles. Normal respiratory effort. No accessory muscle use.  Cardiovascular: Regular rate and rhythm, no murmurs / rubs / gallops. 2+ extremity edema. 2+ pedal pulses. No carotid bruits.  Abdomen: no tenderness, no masses palpated. No hepatosplenomegaly. Bowel sounds positive.  Musculoskeletal: no clubbing / cyanosis. No joint deformity upper and lower extremities. Good ROM, no contractures. Normal muscle tone.  Skin: no rashes, lesions, ulcers. No induration Neurologic: CN 2-12 grossly intact. Sensation intact, DTR normal. Strength 5/5 in all 4.  Psychiatric: Normal judgment and insight. Alert and oriented x 3. Normal mood.     Labs on Admission: I have personally reviewed following labs and imaging studies  CBC:  Recent Labs Lab 11/24/15 1119  WBC 8.4  NEUTROABS 4.7  HGB  12.3  HCT 38.9  MCV 94.9  PLT 123456*   Basic Metabolic Panel:  Recent Labs Lab 11/24/15 1119  NA 141  K 3.5  CL 105  CO2 28  GLUCOSE 86  BUN 32*  CREATININE 1.68*  CALCIUM 9.2   GFR: Estimated Creatinine Clearance: 22.1 mL/min (by C-G formula based on SCr of 1.68 mg/dL (H)). Liver Function Tests:  Recent Labs Lab 11/24/15 1119  AST 22  ALT 17  ALKPHOS 33*  BILITOT 0.6  PROT 6.9  ALBUMIN 4.0   No results for input(s): LIPASE, AMYLASE in the last 168 hours. No results for input(s): AMMONIA in the last 168 hours. Coagulation Profile: No results for input(s): INR, PROTIME in the last 168 hours. Cardiac Enzymes:  Recent Labs Lab 11/24/15 1119  TROPONINI 0.03*   BNP (last 3 results) No results for input(s): PROBNP in the last 8760 hours. HbA1C: No results for input(s): HGBA1C in the last 72 hours. CBG: No results for input(s): GLUCAP in the last 168 hours. Lipid Profile: No results for input(s): CHOL, HDL, LDLCALC, TRIG, CHOLHDL, LDLDIRECT in the last 72 hours. Thyroid Function Tests: No results for input(s): TSH, T4TOTAL, FREET4, T3FREE, THYROIDAB in the last 72 hours. Anemia Panel: No results for input(s): VITAMINB12, FOLATE, FERRITIN, TIBC, IRON, RETICCTPCT in the last 72 hours. Urine analysis:    Component Value Date/Time   COLORURINE YELLOW 11/24/2015 1114   APPEARANCEUR HAZY (A) 11/24/2015 1114   LABSPEC 1.015 11/24/2015 1114   PHURINE 6.0 11/24/2015 1114   GLUCOSEU NEGATIVE 11/24/2015 1114   HGBUR TRACE (A) 11/24/2015 1114   BILIRUBINUR NEGATIVE 11/24/2015 1114   KETONESUR NEGATIVE 11/24/2015 1114   PROTEINUR NEGATIVE 11/24/2015 1114   UROBILINOGEN 0.2 08/19/2014 2009   NITRITE POSITIVE (A) 11/24/2015 1114   LEUKOCYTESUR SMALL (A) 11/24/2015 1114   Sepsis Labs: !!!!!!!!!!!!!!!!!!!!!!!!!!!!!!!!!!!!!!!!!!!! @LABRCNTIP (procalcitonin:4,lacticidven:4) )No results found for this or any previous visit (from the past 240 hour(s)).   Radiological  Exams on Admission: Dg Chest 2 View  Result Date: 11/24/2015 CLINICAL DATA:  Shortness of breath. Lower extremity edema for 3 weeks EXAM: CHEST  2 VIEW COMPARISON:  November 04, 2015 FINDINGS: There is slight scarring in the right upper lobe. There is no edema or consolidation. Heart is upper normal in size with pulmonary vascularity within normal limits. There is atherosclerotic calcification in the aorta. No adenopathy. Patient is status post coronary artery bypass grafting and aortic valve replacement. There are foci of calcific tendinosis in each shoulder. There is calcification in the carotid arteries bilaterally. IMPRESSION: Mild scarring right upper lobe. No edema or consolidation. Aortic atherosclerosis as well as bilateral carotid artery calcification. Patient is status post coronary artery bypass grafting and aortic valve replacement. Electronically Signed   By: Lowella Grip III M.D.   On: 11/24/2015 12:00    EKG: Independently reviewed. Sinus rhythm, without acute changes  Assessment/Plan Active Problems:   Hypertension   Type II diabetes mellitus, uncontrolled (HCC)   Hyperlipidemia   GERD (gastroesophageal reflux disease)   Polymyalgia rheumatica (HCC)   CKD (chronic kidney disease) stage 3, GFR 30-59 ml/min   S/P AVR   Hx of CABG   Chest pain   Acute on chronic diastolic CHF (congestive heart failure) ([place])    1. Chest pain. Appears atypical. Since she does have a history of coronary artery disease, will monitor on telemetry and cycle cardiac markers.  2. Acute on chronic diastolic congestive heart failure. Start patient on IV Lasix. Not a candidate for ACE inhibitor should renal dysfunction. Would avoid beta blockers due  to bradycardia. Will update echocardiogram.  3. CKD stage III. Creatinine is mildly elevated above baseline. Continue to monitor the setting of diuresis.  4. Polymyalgia rheumatica. Steroid dependent. Continue prednisone  5. Hyperlipidemia. Continue  statin  6. Diabetes. Continue basal Lantus as well as NovoLog with meals. Supplement with sliding scale.  7. Coronary artery disease. Continue aspirin and Plavix. Cycle enzymes as above.  8. Hypertension. Continue outpatient regimen.  9. Possible UTI. Urinalysis indicates possible infection. Will check urine culture and start empirically on Rocephin.   DVT prophylaxis: lovenox Code Status: full Family Communication: discussed with son at the bedside Disposition Plan: discharge home once improved Consults called:  Admission status: inpatient, tele   Izrael Peak MD [facility] Pager [phone]  If 7PM-7AM, please contact night-coverage [email or web address] Password [email or web address]  11/24/2015, 5:07 PM

## 2015-11-24 NOTE — Progress Notes (Signed)
Pt walked in c/o swelling in both feet/SOB/dizziness off and on - says memory loss is worse. Feet visibiliy swollen weight today 125lbs - going on for last 2 weeks with swelling - was told to come by our office for check. BP 150/63 HR 53 02 97%. Pt take lasix 40 mg daily. Will forward to Dr. Harl Bowie for further suggestions

## 2015-11-25 ENCOUNTER — Inpatient Hospital Stay (HOSPITAL_COMMUNITY): Payer: Medicare Other

## 2015-11-25 DIAGNOSIS — I509 Heart failure, unspecified: Secondary | ICD-10-CM

## 2015-11-25 DIAGNOSIS — E785 Hyperlipidemia, unspecified: Secondary | ICD-10-CM

## 2015-11-25 LAB — BASIC METABOLIC PANEL
ANION GAP: 7 (ref 5–15)
BUN: 29 mg/dL — ABNORMAL HIGH (ref 6–20)
CHLORIDE: 102 mmol/L (ref 101–111)
CO2: 31 mmol/L (ref 22–32)
Calcium: 8.8 mg/dL — ABNORMAL LOW (ref 8.9–10.3)
Creatinine, Ser: 1.68 mg/dL — ABNORMAL HIGH (ref 0.44–1.00)
GFR calc non Af Amer: 28 mL/min — ABNORMAL LOW (ref >60)
GFR, EST AFRICAN AMERICAN: 32 mL/min — AB (ref >60)
GLUCOSE: 114 mg/dL — AB (ref 65–99)
Potassium: 4 mmol/L (ref 3.5–5.1)
Sodium: 140 mmol/L (ref 135–145)

## 2015-11-25 LAB — GLUCOSE, CAPILLARY
GLUCOSE-CAPILLARY: 109 mg/dL — AB (ref 65–99)
GLUCOSE-CAPILLARY: 227 mg/dL — AB (ref 65–99)
Glucose-Capillary: 186 mg/dL — ABNORMAL HIGH (ref 65–99)
Glucose-Capillary: 202 mg/dL — ABNORMAL HIGH (ref 65–99)

## 2015-11-25 LAB — TROPONIN I: Troponin I: 0.03 ng/mL (ref <0.03)

## 2015-11-25 LAB — ECHOCARDIOGRAM COMPLETE
Height: 63 in
WEIGHTICAEL: 1996.49 [oz_av]

## 2015-11-25 NOTE — Progress Notes (Signed)
PROGRESS NOTE    Sharon Hubbard  [id number] DOB: 1933/02/16 DOA: 11/24/2015 PCP: Neale Burly, MD Outpatient Specialists:  Neurosurgery: Glenna Fellows, MD   Brief Narrative: 82 y.o. female with medical history significant of chronic diastolic congestive heart failure, CKD stage III, coronary artery disease who presents to the hospital with worsening lower extremity edema and CP. While in the ED, BUN 32, Cr 1.68, BNP 155, troponin 0.03, and platelets 120. UA showed possible infection. EKG was unremarkable. CXR did not show any interstitial edema. She was admitted for further evaluation.   Assessment & Plan:   Active Problems:   Hypertension   Type II diabetes mellitus, uncontrolled (HCC)   Hyperlipidemia   GERD (gastroesophageal reflux disease)   Polymyalgia rheumatica (HCC)   CKD (chronic kidney disease) stage 3, GFR 30-59 ml/min   S/P AVR   Hx of CABG   Chest pain   Acute on chronic diastolic CHF (congestive heart failure) ([place])   1. Chest pain. Appears atypical. Ruled out for ACS with negative cardiac markers. Her BNP on admission was 155. CXR showed no acute illnesses.  2. Acute on chronic diastolic congestive heart failure. Started patient on IV Lasix with good urine output. Would continue IV diuretics today. Not a candidate for ACE inhibitor due to renal dysfunction. Would avoid beta blockers due to bradycardia. Echo as below 3. CKD stage III. Creatinine is mildly elevated above baseline. Continue to monitor the setting of diuresis. 4. Polymyalgia rheumatica. Steroid dependent. Continue prednisone 5. Hyperlipidemia. Continue statin 6. Diabetes. Continue basal Lantus as well as NovoLog with meals. Supplement with sliding scale. 7. Coronary artery disease. Continue aspirin and Plavix. ACS ruled out. 8. Hypertension. Stable on norvasc and imdur. 9. Possible UTI. WBC wnl. Urinalysis indicates possible infection. Urine cultures are pending. Continue Rocephin.  DVT  prophylaxis: Lovenox Code Status: Full Family Communication: left VM for son Disposition Plan: Discharge home once improved.   Consultants:   None  Procedures: ECHO: Normal LV systolic function; mild LVH; grade 1 diastolic   dysfunction with elevated LV filling pressure; prosthetic aortic    valve with normal mean gradient of 14 mmHg; mild MR.  Antimicrobials:  Rocephin  11/3 >>   Subjective: Edema is better. No further chest pain  Objective: Vitals:   11/24/15 1330 11/24/15 1400 11/24/15 1448 11/24/15 2125  BP: 145/68 157/63 (!) 137/52 (!) 97/59  Pulse: (!) 52 (!) 58 61 61  Resp: 15 14 16 20   Temp:  98.2 F (36.8 C) 98.2 F (36.8 C) 98.1 F (36.7 C)  TempSrc:  Oral Oral Oral  SpO2: 100% 100% 97% 98%  Weight:   58.9 kg (129 lb 13.6 oz)   Height:   5\' 3"  (1.6 m)     Intake/Output Summary (Last 24 hours) at 11/25/15 0635 Last data filed at 11/25/15 0427  Gross per 24 hour  Intake                0 ml  Output             1700 ml  Net            -1700 ml   Filed Weights   11/24/15 1101 11/24/15 1448  Weight: 56.7 kg (125 lb) 58.9 kg (129 lb 13.6 oz)    Examination:   General exam: Appears calm and comfortable  Respiratory system: Clear to auscultation. Respiratory effort normal. Cardiovascular system: S1 & S2 heard, RRR. No JVD, murmurs, rubs, gallops or clicks. 1+  pedal edema. Gastrointestinal system: Abdomen is nondistended, soft and nontender. No organomegaly or masses felt. Normal bowel sounds heard. Central nervous system: Alert and oriented. No focal neurological deficits. Extremities: Symmetric 5 x 5 power. Skin: No rashes, lesions or ulcers Psychiatry: Judgement and insight appear normal. Mood & affect appropriate.     Data Reviewed: I have personally reviewed following labs and imaging studies  CBC:  Recent Labs Lab 11/24/15 1119  WBC 8.4  NEUTROABS 4.7  HGB 12.3  HCT 38.9  MCV 94.9  PLT 123456*   Basic Metabolic Panel:  Recent  Labs Lab 11/24/15 1119  NA 141  K 3.5  CL 105  CO2 28  GLUCOSE 86  BUN 32*  CREATININE 1.68*  CALCIUM 9.2   GFR: Estimated Creatinine Clearance: 22.1 mL/min (by C-G formula based on SCr of 1.68 mg/dL (H)). Liver Function Tests:  Recent Labs Lab 11/24/15 1119  AST 22  ALT 17  ALKPHOS 33*  BILITOT 0.6  PROT 6.9  ALBUMIN 4.0   No results for input(s): LIPASE, AMYLASE in the last 168 hours. No results for input(s): AMMONIA in the last 168 hours. Coagulation Profile: No results for input(s): INR, PROTIME in the last 168 hours. Cardiac Enzymes:  Recent Labs Lab 11/24/15 1119 11/24/15 1711 11/24/15 2259  TROPONINI 0.03* <0.03 0.03*   BNP (last 3 results) No results for input(s): PROBNP in the last 8760 hours. HbA1C: No results for input(s): HGBA1C in the last 72 hours. CBG:  Recent Labs Lab 11/24/15 2136  GLUCAP 235*   Lipid Profile: No results for input(s): CHOL, HDL, LDLCALC, TRIG, CHOLHDL, LDLDIRECT in the last 72 hours. Thyroid Function Tests:  Recent Labs  11/24/15 1711  TSH 1.028   Anemia Panel: No results for input(s): VITAMINB12, FOLATE, FERRITIN, TIBC, IRON, RETICCTPCT in the last 72 hours. Urine analysis:    Component Value Date/Time   COLORURINE YELLOW 11/24/2015 1114   APPEARANCEUR HAZY (A) 11/24/2015 1114   LABSPEC 1.015 11/24/2015 1114   PHURINE 6.0 11/24/2015 1114   GLUCOSEU NEGATIVE 11/24/2015 1114   HGBUR TRACE (A) 11/24/2015 1114   BILIRUBINUR NEGATIVE 11/24/2015 1114   KETONESUR NEGATIVE 11/24/2015 1114   PROTEINUR NEGATIVE 11/24/2015 1114   UROBILINOGEN 0.2 08/19/2014 2009   NITRITE POSITIVE (A) 11/24/2015 1114   LEUKOCYTESUR SMALL (A) 11/24/2015 1114   Sepsis Labs: @LABRCNTIP (procalcitonin:4,lacticidven:4)  )No results found for this or any previous visit (from the past 240 hour(s)).    Radiology Studies: Dg Chest 2 View  Result Date: 11/24/2015 CLINICAL DATA:  Shortness of breath. Lower extremity edema for 3 weeks  EXAM: CHEST  2 VIEW COMPARISON:  November 04, 2015 FINDINGS: There is slight scarring in the right upper lobe. There is no edema or consolidation. Heart is upper normal in size with pulmonary vascularity within normal limits. There is atherosclerotic calcification in the aorta. No adenopathy. Patient is status post coronary artery bypass grafting and aortic valve replacement. There are foci of calcific tendinosis in each shoulder. There is calcification in the carotid arteries bilaterally. IMPRESSION: Mild scarring right upper lobe. No edema or consolidation. Aortic atherosclerosis as well as bilateral carotid artery calcification. Patient is status post coronary artery bypass grafting and aortic valve replacement. Electronically Signed   By: Lowella Grip III M.D.   On: 11/24/2015 12:00    Scheduled Meds: . amLODipine  10 mg Oral Daily  . aspirin EC  81 mg Oral Daily  . cefTRIAXone (ROCEPHIN)  IV  1 g Intravenous Q24H  . clopidogrel  75 mg Oral Daily  . divalproex  500 mg Oral Daily  . enoxaparin (LOVENOX) injection  30 mg Subcutaneous Q24H  . furosemide  40 mg Intravenous BID  . insulin aspart  0-5 Units Subcutaneous QHS  . insulin aspart  0-9 Units Subcutaneous TID WC  . insulin aspart  7 Units Subcutaneous TID AC  . insulin glargine  18 Units Subcutaneous QHS  . isosorbide mononitrate  60 mg Oral Daily  . latanoprost  1 drop Both Eyes QHS  . mirabegron ER  25 mg Oral BID  . pantoprazole  40 mg Oral Daily  . potassium chloride SA  20 mEq Oral Daily  . predniSONE  5 mg Oral Daily  . rosuvastatin  20 mg Oral q1800  . sodium chloride flush  3 mL Intravenous Q12H  . timolol  1 drop Both Eyes Daily   Continuous Infusions:    LOS: 1 day    Time spent: 25 minutes  Kathie Dike, MD [facility] If 7PM-7AM, please contact night-coverage [email or web address] Password [facility] 11/25/2015, 6:35 AM

## 2015-11-25 NOTE — Progress Notes (Signed)
MD acknowledged critical lab value.  No new orders at this time.

## 2015-11-25 NOTE — Progress Notes (Signed)
ECHO DONE 

## 2015-11-25 NOTE — Progress Notes (Signed)
CRITICAL VALUE ALERT  Critical value received: Troponon 0.03  Date of notification:  11/25/15  Time of notification:  0010  Critical value read back: yes  Nurse who received alert:  D Jaina Morin RN  MD notified (1st page):  [place]  Time of first page:  0015  MD notified (2nd page):  Time of second page:  Responding MD:  Donnal Debar  Time MD responded:  [phone]

## 2015-11-26 LAB — GLUCOSE, CAPILLARY
GLUCOSE-CAPILLARY: 144 mg/dL — AB (ref 65–99)
Glucose-Capillary: 213 mg/dL — ABNORMAL HIGH (ref 65–99)

## 2015-11-26 LAB — BASIC METABOLIC PANEL
Anion gap: 7 (ref 5–15)
BUN: 31 mg/dL — AB (ref 6–20)
CO2: 32 mmol/L (ref 22–32)
CREATININE: 1.43 mg/dL — AB (ref 0.44–1.00)
Calcium: 9 mg/dL (ref 8.9–10.3)
Chloride: 100 mmol/L — ABNORMAL LOW (ref 101–111)
GFR calc Af Amer: 39 mL/min — ABNORMAL LOW (ref >60)
GFR, EST NON AFRICAN AMERICAN: 34 mL/min — AB (ref >60)
GLUCOSE: 137 mg/dL — AB (ref 65–99)
Potassium: 4 mmol/L (ref 3.5–5.1)
SODIUM: 139 mmol/L (ref 135–145)

## 2015-11-26 MED ORDER — MILK AND MOLASSES ENEMA: 1.0000 | Freq: Once | RECTAL | Status: DC

## 2015-11-26 MED ORDER — FUROSEMIDE 40 MG PO TABS: 60.0000 mg | ORAL_TABLET | ORAL | 0 refills | Status: DC

## 2015-11-26 MED ORDER — FUROSEMIDE 40 MG PO TABS: 40.0000 mg | ORAL_TABLET | Freq: Two times a day (BID) | ORAL | 0 refills | Status: DC

## 2015-11-26 NOTE — Discharge Summary (Signed)
Physician Discharge Summary  Sharon Hubbard [id number] DOB: April 28, 1933 DOA: 11/24/2015  PCP: Neale Burly, MD  Admit date: 11/24/2015 Discharge date: 11/26/2015  Admitted From: home Disposition:  home  Recommendations for Outpatient Follow-up:  1. Follow up with PCP in 1-2 weeks 2. Please obtain BMP/CBC in one week 3. Follow up with cardiology in 1-2 weeks will be arranged  Home Health: Equipment/Devices:  Discharge Condition: stable CODE STATUS: full Diet recommendation: Heart Healthy / Carb Modified   Brief/Interim Summary: 82 y.o.femalewith medical history significant of chronic diastolic congestive heart failure, CKDstage III, coronary artery disease who presents to the hospital with worsening lower extremity edema and CP. While in the ED, BUN 32, Cr 1.68, BNP 155, troponin 0.03, and platelets 120. UA showed possible infection. EKG was unremarkable. CXR did not show any interstitial edema. She was admitted for further evaluation.  Discharge Diagnoses:  Active Problems:   Hypertension   Type II diabetes mellitus, uncontrolled (HCC)   Hyperlipidemia   GERD (gastroesophageal reflux disease)   Polymyalgia rheumatica (HCC)   CKD (chronic kidney disease) stage 3, GFR 30-59 ml/min   S/P AVR   Hx of CABG   Chest pain   Acute on chronic diastolic CHF (congestive heart failure) ([place])  1. Chest pain. Appears atypical. Ruled out for ACS with negative cardiac markers. Her BNP on admission was 155. CXR showed no acute illnesses. chest pain has resolved. 2. Acute on chronic diastolic congestive heart failure. Started patient on IV Lasix with good urine output. LE edema have resolved and she will be transitioned to oral lasix. She reports taking lasix 40mg  daily at home. Will increase lasix to 40mg  bid. Renal function has been stable, resume lisinopril on discharge. Would avoid beta blockers due to bradycardia. Echo as below. Follow up with cardiology in 1-2 weeks 3. CKD stage  III. Creatinine is at baseline. . 4. Polymyalgia rheumatica. Steroid dependent. Continue prednisone 5. Hyperlipidemia. Continue statin 6. Diabetes. Continue basal Lantus as well as NovoLog with meals. Supplement with sliding scale. 7. Coronary artery disease. Continue aspirin and Plavix. ACS ruled out. 8. Hypertension. Stable on norvasc and imdur. 9. E coli UTI. WBC wnl. Adequately treated with 3 days of IV rocephin. No fevers or dysuria at this time.Marland Kitchen   Discharge Instructions  Discharge Instructions    Diet - low sodium heart healthy    Complete by:  As directed    Increase activity slowly    Complete by:  As directed        Medication List    TAKE these medications   amLODipine 10 MG tablet Commonly known as:  NORVASC Take 10 mg by mouth daily.   aspirin 81 MG tablet Take 81 mg by mouth daily.   calcium-vitamin D 500-200 MG-UNIT tablet Take 1 tablet by mouth daily.   clopidogrel 75 MG tablet Commonly known as:  PLAVIX Take 1 tablet (75 mg total) by mouth daily.   divalproex 500 MG 24 hr tablet Commonly known as:  DEPAKOTE ER Take 500 mg by mouth daily.   furosemide 40 MG tablet Commonly known as:  LASIX Take 1 tablet (40 mg total) by mouth 2 (two) times daily. One in the morning and one at 4:00 pm What changed:  when to take this  additional instructions   HUMALOG KWIKPEN 100 UNIT/ML KiwkPen Generic drug:  insulin lispro Inject 7 Units into the skin 3 (three) times daily before meals.   HYDROcodone-acetaminophen 10-325 MG tablet Commonly known as:  NORCO  Take 1 tablet by mouth every 6 (six) hours as needed.   isosorbide mononitrate 60 MG 24 hr tablet Commonly known as:  IMDUR Take 60 mg by mouth daily.   LANTUS SOLOSTAR 100 UNIT/ML Solostar Pen Generic drug:  Insulin Glargine Inject 18 Units into the skin at bedtime.   latanoprost 0.005 % ophthalmic solution Commonly known as:  XALATAN Place 1 drop into both eyes at bedtime.   lisinopril 10 MG  tablet Commonly known as:  PRINIVIL,ZESTRIL Take 10 mg by mouth daily.   MYRBETRIQ 25 MG Tb24 tablet Generic drug:  mirabegron ER Take 25 mg by mouth 2 (two) times daily.   pantoprazole 40 MG tablet Commonly known as:  PROTONIX Take 40 mg by mouth daily.   potassium chloride SA 20 MEQ tablet Commonly known as:  K-DUR,KLOR-CON Take 20 mEq by mouth daily.   predniSONE 5 MG tablet Commonly known as:  DELTASONE Take 5 mg by mouth daily. as directed   rosuvastatin 20 MG tablet Commonly known as:  CRESTOR Take 20 mg by mouth daily.   timolol 0.5 % ophthalmic solution Commonly known as:  TIMOPTIC Place 1 drop into both eyes daily.   Vitamin D-3 1000 units Caps Take 1 capsule by mouth daily.       Allergies  Allergen Reactions  . Clarithromycin     REACTION: Vomiting and diarrhea.  . Codeine Nausea And Vomiting  . Fentanyl Other (See Comments)  . Morphine Nausea And Vomiting  . Statins Other (See Comments)  . Sulfa Antibiotics   . Atorvastatin Other (See Comments)    Made her knees buckle and muscles ache    Consultations:     Procedures/Studies: Dg Chest 2 View  Result Date: 11/24/2015 CLINICAL DATA:  Shortness of breath. Lower extremity edema for 3 weeks EXAM: CHEST  2 VIEW COMPARISON:  November 04, 2015 FINDINGS: There is slight scarring in the right upper lobe. There is no edema or consolidation. Heart is upper normal in size with pulmonary vascularity within normal limits. There is atherosclerotic calcification in the aorta. No adenopathy. Patient is status post coronary artery bypass grafting and aortic valve replacement. There are foci of calcific tendinosis in each shoulder. There is calcification in the carotid arteries bilaterally. IMPRESSION: Mild scarring right upper lobe. No edema or consolidation. Aortic atherosclerosis as well as bilateral carotid artery calcification. Patient is status post coronary artery bypass grafting and aortic valve replacement.  Electronically Signed   By: Lowella Grip III M.D.   On: 11/24/2015 12:00   US Venous Img Lower Bilateral  Result Date: 11/25/2015 CLINICAL DATA:  Chronic bilateral lower extremity pain EXAM: BILATERAL LOWER EXTREMITY VENOUS DOPPLER ULTRASOUND TECHNIQUE: Gray-scale sonography with graded compression, as well as color Doppler and duplex ultrasound were performed to evaluate the lower extremity deep venous systems from the level of the common femoral vein and including the common femoral, femoral, profunda femoral, popliteal and calf veins including the posterior tibial, peroneal and gastrocnemius veins when visible. The superficial great saphenous vein was also interrogated. Spectral Doppler was utilized to evaluate flow at rest and with distal augmentation maneuvers in the common femoral, femoral and popliteal veins. COMPARISON:  None. FINDINGS: RIGHT LOWER EXTREMITY Common Femoral Vein: No evidence of thrombus. Normal compressibility, respiratory phasicity and response to augmentation. Saphenofemoral Junction: No evidence of thrombus. Normal compressibility and flow on color Doppler imaging. Profunda Femoral Vein: No evidence of thrombus. Normal compressibility and flow on color Doppler imaging. Femoral Vein: No evidence of thrombus.  Normal compressibility, respiratory phasicity and response to augmentation. Popliteal Vein: No evidence of thrombus. Normal compressibility, respiratory phasicity and response to augmentation. Calf Veins: No evidence of thrombus. Normal compressibility and flow on color Doppler imaging. Superficial Great Saphenous Vein: No evidence of thrombus. Normal compressibility and flow on color Doppler imaging. Venous Reflux:  None. Other Findings:  None. LEFT LOWER EXTREMITY Common Femoral Vein: Focal nonocclusive hypoechoic thrombus from the great saphenous vein extends into the common femoral vein. This does not propagate into the femoral and popliteal veins. Saphenofemoral Junction:  Focal short segment nonocclusive thrombus at the saphenous femoral junction into the common femoral vein. This does not propagate inferiorly into the GSV. Profunda Femoral Vein: No evidence of thrombus. Normal compressibility and flow on color Doppler imaging. Femoral Vein: No evidence of thrombus. Normal compressibility, respiratory phasicity and response to augmentation. Popliteal Vein: No evidence of thrombus. Normal compressibility, respiratory phasicity and response to augmentation. Calf Veins: No evidence of thrombus. Normal compressibility and flow on color Doppler imaging. Superficial Great Saphenous Vein: Focal nonocclusive super for thrombus at the saphenous femoral junction as above without propagation inferiorly. Venous Reflux:  None. Other Findings:  None. IMPRESSION: Negative for right lower extremity DVT. Focal superficial thrombus extends from the saphenous femoral junction into the common femoral vein as above. Very low thrombus burden in the left lower extremity without propagation into the femoral and popliteal veins. Electronically Signed   By: Jerilynn Mages.  Shick M.D.   On: 11/25/2015 08:13    ECHO: Normal LV systolic function; mild LVH; grade 1 diastolic dysfunction with elevated LV filling pressure; prosthetic aortic valve with normal mean gradient of 14 mmHg; mild MR.   Subjective: Feeling better. No shortness of breath or chest pain. Feels swelling has improved  Discharge Exam: Vitals:   11/25/15 2108 11/26/15 0524  BP: (!) 109/55 (!) 141/83  Pulse: (!) 55 (!) 52  Resp: 20 20  Temp: 98.5 F (36.9 C) 98.6 F (37 C)   Vitals:   11/25/15 0605 11/25/15 1412 11/25/15 2108 11/26/15 0524  BP: 129/62 (!) 121/53 (!) 109/55 (!) 141/83  Pulse: (!) 51 (!) 55 (!) 55 (!) 52  Resp: 20 18 20 20   Temp: 97.2 F (36.2 C) 97.5 F (36.4 C) 98.5 F (36.9 C) 98.6 F (37 C)  TempSrc: Oral Oral Oral Oral  SpO2: 98% 100% 97% 99%  Weight: 56.6 kg (124 lb 12.5 oz)   55.7 kg (122 lb 12.7 oz)   Height:        General: Pt is alert, awake, not in acute distress Cardiovascular: RRR, S1/S2 +, no rubs, no gallops Respiratory: CTA bilaterally, no wheezing, no rhonchi Abdominal: Soft, NT, ND, bowel sounds + Extremities: no edema, no cyanosis    The results of significant diagnostics from this hospitalization (including imaging, microbiology, ancillary and laboratory) are listed below for reference.     Microbiology: Recent Results (from the past 240 hour(s))  Culture, Urine     Status: Abnormal (Preliminary result)   Collection Time: 11/24/15 11:14 AM  Result Value Ref Range Status   Specimen Description URINE, CLEAN CATCH  Final   Special Requests NONE  Final   Culture >=100,000 COLONIES/mL ESCHERICHIA COLI (A)  Final   Report Status PENDING  Incomplete     Labs: BNP (last 3 results)  Recent Labs  11/24/15 1119  BNP [id number]*   Basic Metabolic Panel:  Recent Labs Lab 11/24/15 1119 11/25/15 0656 11/26/15 0639  NA 141 140 139  K 3.5  4.0 4.0  CL 105 102 100*  CO2 28 31 32  GLUCOSE 86 114* 137*  BUN 32* 29* 31*  CREATININE 1.68* 1.68* 1.43*  CALCIUM 9.2 8.8* 9.0   Liver Function Tests:  Recent Labs Lab 11/24/15 1119  AST 22  ALT 17  ALKPHOS 33*  BILITOT 0.6  PROT 6.9  ALBUMIN 4.0   No results for input(s): LIPASE, AMYLASE in the last 168 hours. No results for input(s): AMMONIA in the last 168 hours. CBC:  Recent Labs Lab 11/24/15 1119  WBC 8.4  NEUTROABS 4.7  HGB 12.3  HCT 38.9  MCV 94.9  PLT 120*   Cardiac Enzymes:  Recent Labs Lab 11/24/15 1119 11/24/15 1711 11/24/15 2259 11/25/15 0656  TROPONINI 0.03* <0.03 0.03* <0.03   BNP: Invalid input(s): POCBNP CBG:  Recent Labs Lab 11/25/15 1200 11/25/15 1624 11/25/15 2110 11/26/15 0741 11/26/15 1127  GLUCAP 186* 202* 227* 144* 213*   D-Dimer No results for input(s): DDIMER in the last 72 hours. Hgb A1c No results for input(s): HGBA1C in the last 72 hours. Lipid Profile No  results for input(s): CHOL, HDL, LDLCALC, TRIG, CHOLHDL, LDLDIRECT in the last 72 hours. Thyroid function studies  Recent Labs  11/24/15 1711  TSH 1.028   Anemia work up No results for input(s): VITAMINB12, FOLATE, FERRITIN, TIBC, IRON, RETICCTPCT in the last 72 hours. Urinalysis    Component Value Date/Time   COLORURINE YELLOW 11/24/2015 1114   APPEARANCEUR HAZY (A) 11/24/2015 1114   LABSPEC 1.015 11/24/2015 1114   PHURINE 6.0 11/24/2015 1114   GLUCOSEU NEGATIVE 11/24/2015 1114   HGBUR TRACE (A) 11/24/2015 1114   BILIRUBINUR NEGATIVE 11/24/2015 1114   KETONESUR NEGATIVE 11/24/2015 1114   PROTEINUR NEGATIVE 11/24/2015 1114   UROBILINOGEN 0.2 08/19/2014 2009   NITRITE POSITIVE (A) 11/24/2015 1114   LEUKOCYTESUR SMALL (A) 11/24/2015 1114   Sepsis Labs Invalid input(s): PROCALCITONIN,  WBC,  LACTICIDVEN Microbiology Recent Results (from the past 240 hour(s))  Culture, Urine     Status: Abnormal (Preliminary result)   Collection Time: 11/24/15 11:14 AM  Result Value Ref Range Status   Specimen Description URINE, CLEAN CATCH  Final   Special Requests NONE  Final   Culture >=100,000 COLONIES/mL ESCHERICHIA COLI (A)  Final   Report Status PENDING  Incomplete     Time coordinating discharge: Over 30 minutes  SIGNED:   Kathie Dike, MD  [facility] 11/26/2015, 2:12 PM Pager   If 7PM-7AM, please contact night-coverage [email or web address] Password [email or web address]

## 2015-11-26 NOTE — Progress Notes (Signed)
CM able to speak with patient and arrange [facility] services.  Submitted information to [facility].  [phone] faxed information to the [facility] office Attn: Valetta Fuller [phone]

## 2015-11-26 NOTE — Progress Notes (Signed)
Patient states understanding of discharge instructions.  

## 2015-11-27 DIAGNOSIS — R079 Chest pain, unspecified: Secondary | ICD-10-CM | POA: Diagnosis not present

## 2015-11-27 DIAGNOSIS — I5033 Acute on chronic diastolic (congestive) heart failure: Secondary | ICD-10-CM | POA: Diagnosis not present

## 2015-11-27 DIAGNOSIS — M6281 Muscle weakness (generalized): Secondary | ICD-10-CM | POA: Diagnosis not present

## 2015-11-27 DIAGNOSIS — E1165 Type 2 diabetes mellitus with hyperglycemia: Secondary | ICD-10-CM | POA: Diagnosis not present

## 2015-11-27 DIAGNOSIS — I10 Essential (primary) hypertension: Secondary | ICD-10-CM | POA: Diagnosis not present

## 2015-11-27 DIAGNOSIS — I129 Hypertensive chronic kidney disease with stage 1 through stage 4 chronic kidney disease, or unspecified chronic kidney disease: Secondary | ICD-10-CM | POA: Diagnosis not present

## 2015-11-27 LAB — URINE CULTURE: Culture: >=100000 — AB

## 2015-11-27 NOTE — Progress Notes (Signed)
Pt will see PA 11/29 [place] - pt made aware

## 2015-11-30 DIAGNOSIS — I10 Essential (primary) hypertension: Secondary | ICD-10-CM | POA: Diagnosis not present

## 2015-11-30 DIAGNOSIS — I129 Hypertensive chronic kidney disease with stage 1 through stage 4 chronic kidney disease, or unspecified chronic kidney disease: Secondary | ICD-10-CM | POA: Diagnosis not present

## 2015-11-30 DIAGNOSIS — E1165 Type 2 diabetes mellitus with hyperglycemia: Secondary | ICD-10-CM | POA: Diagnosis not present

## 2015-11-30 DIAGNOSIS — I5033 Acute on chronic diastolic (congestive) heart failure: Secondary | ICD-10-CM | POA: Diagnosis not present

## 2015-11-30 DIAGNOSIS — M6281 Muscle weakness (generalized): Secondary | ICD-10-CM | POA: Diagnosis not present

## 2015-11-30 DIAGNOSIS — R079 Chest pain, unspecified: Secondary | ICD-10-CM | POA: Diagnosis not present

## 2015-12-01 DIAGNOSIS — R079 Chest pain, unspecified: Secondary | ICD-10-CM | POA: Diagnosis not present

## 2015-12-01 DIAGNOSIS — M6281 Muscle weakness (generalized): Secondary | ICD-10-CM | POA: Diagnosis not present

## 2015-12-01 DIAGNOSIS — I129 Hypertensive chronic kidney disease with stage 1 through stage 4 chronic kidney disease, or unspecified chronic kidney disease: Secondary | ICD-10-CM | POA: Diagnosis not present

## 2015-12-01 DIAGNOSIS — I5033 Acute on chronic diastolic (congestive) heart failure: Secondary | ICD-10-CM | POA: Diagnosis not present

## 2015-12-01 DIAGNOSIS — E1165 Type 2 diabetes mellitus with hyperglycemia: Secondary | ICD-10-CM | POA: Diagnosis not present

## 2015-12-01 DIAGNOSIS — I10 Essential (primary) hypertension: Secondary | ICD-10-CM | POA: Diagnosis not present

## 2015-12-07 DIAGNOSIS — R0789 Other chest pain: Secondary | ICD-10-CM | POA: Diagnosis not present

## 2015-12-08 DIAGNOSIS — I5033 Acute on chronic diastolic (congestive) heart failure: Secondary | ICD-10-CM | POA: Diagnosis not present

## 2015-12-08 DIAGNOSIS — R079 Chest pain, unspecified: Secondary | ICD-10-CM | POA: Diagnosis not present

## 2015-12-08 DIAGNOSIS — I129 Hypertensive chronic kidney disease with stage 1 through stage 4 chronic kidney disease, or unspecified chronic kidney disease: Secondary | ICD-10-CM | POA: Diagnosis not present

## 2015-12-08 DIAGNOSIS — I10 Essential (primary) hypertension: Secondary | ICD-10-CM | POA: Diagnosis not present

## 2015-12-08 DIAGNOSIS — M6281 Muscle weakness (generalized): Secondary | ICD-10-CM | POA: Diagnosis not present

## 2015-12-08 DIAGNOSIS — E1165 Type 2 diabetes mellitus with hyperglycemia: Secondary | ICD-10-CM | POA: Diagnosis not present

## 2015-12-12 DIAGNOSIS — R079 Chest pain, unspecified: Secondary | ICD-10-CM | POA: Diagnosis not present

## 2015-12-12 DIAGNOSIS — I129 Hypertensive chronic kidney disease with stage 1 through stage 4 chronic kidney disease, or unspecified chronic kidney disease: Secondary | ICD-10-CM | POA: Diagnosis not present

## 2015-12-12 DIAGNOSIS — I5033 Acute on chronic diastolic (congestive) heart failure: Secondary | ICD-10-CM | POA: Diagnosis not present

## 2015-12-12 DIAGNOSIS — M6281 Muscle weakness (generalized): Secondary | ICD-10-CM | POA: Diagnosis not present

## 2015-12-12 DIAGNOSIS — I10 Essential (primary) hypertension: Secondary | ICD-10-CM | POA: Diagnosis not present

## 2015-12-12 DIAGNOSIS — E1165 Type 2 diabetes mellitus with hyperglycemia: Secondary | ICD-10-CM | POA: Diagnosis not present

## 2015-12-20 ENCOUNTER — Ambulatory Visit (INDEPENDENT_AMBULATORY_CARE_PROVIDER_SITE_OTHER): Payer: Medicare Other | Admitting: Physician Assistant

## 2015-12-20 ENCOUNTER — Other Ambulatory Visit (HOSPITAL_COMMUNITY)
Admission: RE | Admit: 2015-12-20 | Discharge: 2015-12-20 | Disposition: A | Discharge status: Home / Self Care | Payer: Medicare Other | Source: Ambulatory Visit | Attending: Physician Assistant | Admitting: Physician Assistant

## 2015-12-20 ENCOUNTER — Encounter: Payer: Self-pay | Admitting: Physician Assistant

## 2015-12-20 VITALS — BP 138/62 | HR 57 | Ht 63.0 in | Wt 125.0 lb

## 2015-12-20 DIAGNOSIS — Z79899 Other long term (current) drug therapy: Secondary | ICD-10-CM | POA: Diagnosis not present

## 2015-12-20 DIAGNOSIS — I25810 Atherosclerosis of coronary artery bypass graft(s) without angina pectoris: Secondary | ICD-10-CM

## 2015-12-20 DIAGNOSIS — I10 Essential (primary) hypertension: Secondary | ICD-10-CM

## 2015-12-20 DIAGNOSIS — I5032 Chronic diastolic (congestive) heart failure: Secondary | ICD-10-CM

## 2015-12-20 DIAGNOSIS — N183 Chronic kidney disease, stage 3 unspecified: Secondary | ICD-10-CM

## 2015-12-20 LAB — BASIC METABOLIC PANEL
ANION GAP: 8 (ref 5–15)
BUN: 34 mg/dL — ABNORMAL HIGH (ref 6–20)
CALCIUM: 9.3 mg/dL (ref 8.9–10.3)
CO2: 30 mmol/L (ref 22–32)
Chloride: 100 mmol/L — ABNORMAL LOW (ref 101–111)
Creatinine, Ser: 1.58 mg/dL — ABNORMAL HIGH (ref 0.44–1.00)
GFR calc Af Amer: 35 mL/min — ABNORMAL LOW (ref >60)
GFR, EST NON AFRICAN AMERICAN: 30 mL/min — AB (ref >60)
GLUCOSE: 139 mg/dL — AB (ref 65–99)
POTASSIUM: 4.4 mmol/L (ref 3.5–5.1)
SODIUM: 138 mmol/L (ref 135–145)

## 2015-12-20 MED ORDER — FUROSEMIDE 40 MG PO TABS: 40.0000 mg | ORAL_TABLET | Freq: Every day | ORAL | 3 refills | Status: DC

## 2015-12-20 NOTE — Progress Notes (Signed)
Cardiology Office Note    Date:  12/20/2015   ID:  Sharon Hubbard, DOB 21-Aug-1933, MRN [id number]  PCP:  Neale Burly, MD  Cardiologist: Dr. Harl Bowie   No chief complaint on file.   History of Present Illness:  Sharon Hubbard is a 82 y.o. female with history of CAD status post CABG 1 and aVR pericardial tissue valve 08/2014 with the RIMA to the RCA. Previous history of DES to the mid LAD in 2012 and 2015. Echo 07/2014 LVEF 50-60% with grade 1 DD. Admission to [facility] earlier this year with syncope felt secondary to dehydration and UTI. Also has history of bradycardia.  Patient had recent hospitalization 08/24/15 with chest pain and acute on chronic diastolic CHF. She had negative cardiac markers and BNP was 155. She diuresed with IV Lasix and was sent home on 40 mg twice a day. She also had a UTI that was treated. 2-D echo 11/25/15 showed normal LVEF 55-60% with grade 1 DD and elevated LV filling pressures, prosthetic aortic valve with a normal mean gradient of 14 mmHg, mild MR.  Patient comes in today accompanied by her son. She feels very weak. No more edema since hospitalization and wearing TED hose. Denies chest pain, palpitations, dyspnea or dyspnea on exertion. Gets extra salt in diet sometimes. Son says primary care wants her on lower lasix because of kidneys. There was a mix up on her mail order meds that increased her dose to 60 mg in am 40mg  pm. Primary care Wants her on lower dose Lasix if it all possible because of her kidney function. He also have questions concerning her Plavix. She was hospitalized sometime in June with a UTI at [facility]. He stopped her Plavix at the time. They think some day called here to make sure it was okay but I can't find any notes verifying that. At some point her Plavix was reinstituted and they're not sure who started it back. She is currently taking it but missed 3 doses waiting for her refills to come in. They want to know whether or  not she needs to be on this.    Past Medical History:  Diagnosis Date  . Anemia   . Aortic stenosis    very mild, echo 12/2010  . Blood transfusion   . Bradycardia, sinus   . CAD (coronary artery disease)    Interventions in the past  . Cancer ([place])    ""had hysterectomy for a 6 showing cancer; think that's pretty strong  . Complication of anesthesia    "felt like I was smothering once with mask on my face"  . Diabetes mellitus   . Dyslipidemia   . Ejection fraction    EF 65%, echo, April, 2012, aortic valve sclerosis with very slight gradient  . Femoral bruit    01/2011 hosp, but no pseudoaneurysm or AV fistula  . GERD (gastroesophageal reflux disease)   . Groin hematoma    April, 2011  . Hypertension   . Myocardial infarction 2011; 08/2010  . Osteoarthritis    arthroscopic surgery right knee February, 2012  . Pancreas cyst    The patient has multiple pancreatic cysts.  This is followed at [facility]         . Polymyalgia rheumatica ([place])   . Preoperative evaluation to rule out surgical contraindication    Patient needs back surgery by Dr. Carloyn Manner, May, 2012                .  RBBB (right bundle branch block)    old  . Renal insufficiency   . Schatzki's ring   . Shortness of breath on exertion   . Spinal stenosis    history of surgery for this  . Stroke ([place]) 01/18/11   "I've had 2 TIA's"    Past Surgical History:  Procedure Laterality Date  . ABDOMINAL HYSTERECTOMY  ~ 1974  . AORTIC VALVE REPLACEMENT N/A 08/22/2014   Procedure: AORTIC VALVE REPLACEMENT (AVR);  Surgeon: Melrose Nakayama, MD;  Location: [place];  Service: Open Heart Surgery;  Laterality: N/A;  . APPENDECTOMY    . BACK SURGERY    . CARDIAC CATHETERIZATION  01/18/11  . CARDIAC CATHETERIZATION N/A 08/16/2014   Procedure: Left Heart Cath and Coronary Angiography;  Surgeon: Leonie Man, MD;  Location: [place] CV LAB;  Service: Cardiovascular;  Laterality: N/A;  . CARDIAC CATHETERIZATION N/A 08/16/2014    Procedure: Coronary Stent Intervention;  Surgeon: Leonie Man, MD;  Location: [place] CV LAB;  Service: Cardiovascular;  Laterality: N/A;  . CARDIAC CATHETERIZATION N/A 08/17/2014   Procedure: Coronary/Graft Atherectomy;  Surgeon: Burnell Blanks, MD;  Location: [facility] CV LAB;  Service: Cardiovascular;  Laterality: N/A;  . CHOLECYSTECTOMY  1987  . CORONARY ANGIOGRAM  01/18/2011   Procedure: CORONARY ANGIOGRAM;  Surgeon: Larey Dresser, MD;  Location: [facility] CATH LAB;  Service: Cardiovascular;;  . CORONARY ANGIOPLASTY WITH STENT PLACEMENT  2011   "in [place]"  . CORONARY ANGIOPLASTY WITH STENT PLACEMENT  08/2010; 03/25/2013  . CORONARY ARTERY BYPASS GRAFT N/A 08/22/2014   Procedure: CORONARY ARTERY BYPASS GRAFTING (CABG), ON PUMP, TIMES ONE, USING RIGHT INTERNAL MAMMARY ARTERY;  Surgeon: Melrose Nakayama, MD;  Location: [place];  Service: Open Heart Surgery;  Laterality: N/A;  . DILATION AND CURETTAGE OF UTERUS    . FEMORAL ARTERY EXPLORATION Right 03/26/2013   Procedure: FEMORAL ARTERY EXPLORATION WITH SUTURE REPAIR; EVACUATION OF HEMATOMA;  Surgeon: Mal Misty, MD;  Location: [place];  Service: Vascular;  Laterality: Right;  . KNEE ARTHROSCOPY  02/2010   right knee; chrondoplasty medial femoral condyle;  Partial medial meniscectomy.   Marland Kitchen LEFT AND RIGHT HEART CATHETERIZATION WITH CORONARY ANGIOGRAM N/A 03/25/2013   Procedure: LEFT AND RIGHT HEART CATHETERIZATION WITH CORONARY ANGIOGRAM;  Surgeon: Burnell Blanks, MD;  Location: [facility] CATH LAB;  Service: Cardiovascular;  Laterality: N/A;  . LUMBAR SPINE SURGERY  ~ 1977; ~ 2010   "2 hugh ruptured discs; I was paralyzed first OR; 2nd OR by Dr. Carloyn Manner, don't know what for"  . TEE WITHOUT CARDIOVERSION N/A 08/22/2014   Procedure: TRANSESOPHAGEAL ECHOCARDIOGRAM (TEE);  Surgeon: Melrose Nakayama, MD;  Location: [place];  Service: Open Heart Surgery;  Laterality: N/A;  . TUBAL LIGATION  1970's    Current Medications: Outpatient  Medications Prior to Visit  Medication Sig Dispense Refill  . amLODipine (NORVASC) 10 MG tablet Take 10 mg by mouth daily.      Marland Kitchen aspirin 81 MG tablet Take 81 mg by mouth daily.      . Calcium Carbonate-Vitamin D (CALCIUM-VITAMIN D) 500-200 MG-UNIT tablet Take 1 tablet by mouth daily.    . Cholecalciferol (VITAMIN D-3) 1000 units CAPS Take 1 capsule by mouth daily.    . clopidogrel (PLAVIX) 75 MG tablet Take 1 tablet (75 mg total) by mouth daily. 90 tablet 3  . divalproex (DEPAKOTE) 500 MG 24 hr tablet Take 500 mg by mouth daily.      Marland Kitchen HYDROcodone-acetaminophen ([place])  10-325 MG tablet Take 1 tablet by mouth every 6 (six) hours as needed.  0  . Insulin Glargine (LANTUS SOLOSTAR) 100 UNIT/ML Solostar Pen Inject 18 Units into the skin at bedtime.     . insulin lispro (HUMALOG KWIKPEN) 100 UNIT/ML KiwkPen Inject 7 Units into the skin 3 (three) times daily before meals.     . isosorbide mononitrate (IMDUR) 60 MG 24 hr tablet Take 60 mg by mouth daily.     Marland Kitchen latanoprost (XALATAN) 0.005 % ophthalmic solution Place 1 drop into both eyes at bedtime.      Marland Kitchen lisinopril (PRINIVIL,ZESTRIL) 10 MG tablet Take 10 mg by mouth daily.    . mirabegron ER (MYRBETRIQ) 25 MG TB24 tablet Take 25 mg by mouth 2 (two) times daily.     . pantoprazole (PROTONIX) 40 MG tablet Take 40 mg by mouth daily.    . potassium chloride SA (K-DUR,KLOR-CON) 20 MEQ tablet Take 20 mEq by mouth daily.    . predniSONE (DELTASONE) 5 MG tablet Take 5 mg by mouth daily. as directed  0  . rosuvastatin (CRESTOR) 20 MG tablet Take 20 mg by mouth daily.    . timolol (TIMOPTIC) 0.5 % ophthalmic solution Place 1 drop into both eyes daily.     . furosemide (LASIX) 40 MG tablet Take 1 tablet (40 mg total) by mouth 2 (two) times daily. One in the morning and one at 4:00 pm 60 tablet 0   No facility-administered medications prior to visit.      Allergies:   Clarithromycin; Codeine; Fentanyl; Morphine; Statins; Sulfa antibiotics; and Atorvastatin     Social History   Social History  . Marital status: Married    Spouse name: N/A  . Number of children: N/A  . Years of education: N/A   Social History Main Topics  . Smoking status: Never Smoker  . Smokeless tobacco: Never Used  . Alcohol use No  . Drug use: No  . Sexual activity: No   Other Topics Concern  . None   Social History Narrative  . None     Family History:  The patient's family history includes Other (age of onset: 51) in her father; Uterine cancer (age of onset: 48) in her mother.   ROS:   Please see the history of present illness.    Review of Systems  Constitution: Positive for weakness and malaise/fatigue.  HENT: Negative.   Eyes: Negative.   Cardiovascular: Negative.   Respiratory: Negative.   Hematologic/Lymphatic: Negative.   Musculoskeletal: Negative.  Negative for joint pain.  Gastrointestinal: Negative.   Genitourinary: Negative.    All other systems reviewed and are negative.   PHYSICAL EXAM:   VS:  BP 138/62   Pulse (!) 57   Ht 5\' 3"  (1.6 m)   Wt 125 lb (56.7 kg)   SpO2 99%   BMI 22.14 kg/m   Physical Exam  GEN: Thin, elderly, in no acute distress  Neck: no JVD, carotid bruits, or masses Cardiac:RRR; no murmurs, rubs, or gallops  Respiratory:  clear to auscultation bilaterally, normal work of breathing GI: soft, nontender, nondistended, + BS Ext: Compression stockings in place without cyanosis, clubbing, or edema, Good distal pulses bilaterally MS: no deformity or atrophy  Skin: warm and dry, no rash Psych: euthymic mood, full affect  Wt Readings from Last 3 Encounters:  12/20/15 125 lb (56.7 kg)  11/26/15 122 lb 12.7 oz (55.7 kg)  11/24/15 125 lb (56.7 kg)      Studies/Labs  Reviewed:   EKG:  EKG is not ordered today.    Recent Labs: 11/24/2015: ALT 17; B Natriuretic Peptide 155.0; Hemoglobin 12.3; Platelets 120; TSH 1.028 12/20/2015: BUN 34; Creatinine, Ser 1.58; Potassium 4.4; Sodium 138   Lipid Panel    Component  Value Date/Time   CHOL 147 12/09/2014 0933   TRIG 136 12/09/2014 0933   HDL 81 12/09/2014 0933   CHOLHDL 1.8 12/09/2014 0933   VLDL 27 12/09/2014 0933   LDLCALC 39 12/09/2014 0933    Additional studies/ records that were reviewed today include:  2-D echo 11/25/15 Study Conclusions   - Left ventricle: The cavity size was normal. Wall thickness was   increased in a pattern of mild LVH. Systolic function was normal.   The estimated ejection fraction was in the range of 55% to 60%.   Wall motion was normal; there were no regional wall motion   abnormalities. Doppler parameters are consistent with abnormal   left ventricular relaxation (grade 1 diastolic dysfunction).   Doppler parameters are consistent with high ventricular filling   pressure. - Aortic valve: A bioprosthesis was present. Valve area (VTI): 1.5   cm^2. Valve area (Vmax): 1.29 cm^2. Valve area (Vmean): 1.5 cm^2. - Mitral valve: Calcified annulus. Mildly thickened leaflets .   There was mild regurgitation.   Impressions:   - Normal LV systolic function; mild LVH; grade 1 diastolic   dysfunction with elevated LV filling pressure; prosthetic aortic   valve with normal mean gradient of 14 mmHg; mild MR.       ASSESSMENT:    1. Drug therapy   2. Chronic diastolic CHF (congestive heart failure) ([place])   3. Coronary artery disease involving other coronary artery bypass graft without angina pectoris   4. Essential hypertension   5. CKD (chronic kidney disease) stage 3, GFR 30-59 ml/min      PLAN:  In order of problems listed above: Drug therapy will reduce Lasix to 40 mg once daily and check renal function today.  Chronic diastolic CHF currently compensated. Had an acute episode in the setting of a UTI treated with IV diuretics in the hospital. Because of CK D will reduce Lasix to 40 mg once daily. Check renal function today. 2 g sodium diet  CAD status post CABG 1 and aVR pericardial tissue valve 08/2014 with the  RIMA to the RCA. Previous history of DES to the mid LAD in 2012 and 2015. There is been a lot of confusion over her Plavix which was stopped during a hospitalization at [facility] for a UTI. Somehow this was restarted and the son is wondering if she needs to be on this or not. She has no bleeding difficulties. They just wanted definitive decision. We'll forward to Dr. Harl Bowie for his input.  Essential hypertension controlled  CK D check renal function today.     Medication Adjustments/Labs and Tests Ordered: Current medicines are reviewed at length with the patient today.  Concerns regarding medicines are outlined above.  Medication changes, Labs and Tests ordered today are listed in the Patient Instructions below. Patient Instructions  Your physician recommends that you schedule a follow-up appointment in: 2 Months Dr. Harl Bowie   Your physician has recommended you make the following change in your medication:   Decrease Lasix to 40 mg Daily   Your physician recommends that you return for lab work in: Today  If you need a refill on your cardiac medications before your next appointment, please call your pharmacy.  Thank you  for choosing [facility]!        Sumner Boast, PA-C  12/20/2015 3:00 PM    [facility] [address], [place], [place]  [address] Phone: [phone]; Fax: [phone]

## 2015-12-20 NOTE — Patient Instructions (Signed)
Your physician recommends that you schedule a follow-up appointment in: 2 Months Dr. Harl Bowie   Your physician has recommended you make the following change in your medication:   Decrease Lasix to 40 mg Daily   Your physician recommends that you return for lab work in: Today  If you need a refill on your cardiac medications before your next appointment, please call your pharmacy.  Thank you for choosing [facility]!

## 2015-12-21 ENCOUNTER — Telehealth: Payer: Self-pay

## 2015-12-21 DIAGNOSIS — Z79899 Other long term (current) drug therapy: Secondary | ICD-10-CM

## 2015-12-21 NOTE — Telephone Encounter (Signed)
-----   Message from Imogene Burn, PA-C sent at 12/20/2015  3:12 PM EST ----- Kidney function up a little today but have decreased lasix. Repeat bmet in 2-3 weeks

## 2015-12-21 NOTE — Telephone Encounter (Signed)
Relayed message to son. They want to know if pt should continue plavix, they are waiting to hear

## 2015-12-21 NOTE — Progress Notes (Signed)
Ok to stop plavix, continue aspirin. Typically we due both for at least a year after a recent heart attack. She had a mild heart attack at the time of her CABG in 08/2014. Reviewing Dr Kae Heller notes who was seeing her before and at the time it was started, his plan was to stop plavix as well a few 6-12 months after her bypass.   Zandra Abts MD

## 2015-12-22 NOTE — Telephone Encounter (Signed)
Arnoldo Lenis, MD  Imogene Burn, PA-C Cc: [place] to stop plavix. Please see my progress note from yesterday, I tried to route it to you but perhaps did not make it. Please let family know.    Zandra Abts MD      Pt notified to stop plavix

## 2015-12-22 NOTE — Addendum Note (Signed)
Addended by: Barbarann Ehlers A on: 12/22/2015 10:05 AM   Modules accepted: Orders

## 2015-12-22 NOTE — Telephone Encounter (Signed)
Dr. Harl Bowie, Can you review my note and decide if you want patient to stay on Plavix? Thanks, Selinda Eon

## 2015-12-26 ENCOUNTER — Telehealth: Payer: Self-pay | Admitting: Gastroenterology

## 2015-12-26 NOTE — Telephone Encounter (Signed)
Letter mailed to pt.  

## 2015-12-26 NOTE — Telephone Encounter (Signed)
Recall for tcs °

## 2015-12-27 ENCOUNTER — Telehealth: Payer: Self-pay

## 2015-12-27 NOTE — Telephone Encounter (Signed)
Called pt. No answer. Will try back this afternoon.

## 2016-01-02 ENCOUNTER — Telehealth: Payer: Self-pay

## 2016-01-02 NOTE — Telephone Encounter (Signed)
-----   Message from Imogene Burn, PA-C sent at 12/22/2015  9:16 AM EST ----- Lattie Haw, Please let patient's son know that Dr. Harl Bowie says it's ok to stop Plavix but continue aspirin. See his note for details. Thanks, Selinda Eon ----- Message ----- From: Arnoldo Lenis, MD Sent: 12/21/2015   3:49 PM To: Imogene Burn, PA-C, Drema Dallas, CMA    ----- Message ----- From: Imogene Burn, PA-C Sent: 12/20/2015   3:06 PM To: Arnoldo Lenis, MD  Dr. Harl Bowie can you review this patient to see if you want her on Plavix. There has been a lot of confusion since it was stopped back in June when she was hospitalized at [facility] for a UTI. Someone restarted it and the son isn't sure who or if she needs it. She is not having any bleeding difficulties on it. We need to call the son because the patient has memory problems. Thanks, Selinda Eon

## 2016-01-02 NOTE — Telephone Encounter (Signed)
Mailed pt. Letter to advise her it is okay to stop plavix but stay on aspirin.

## 2016-01-09 DIAGNOSIS — E1342 Other specified diabetes mellitus with diabetic polyneuropathy: Secondary | ICD-10-CM | POA: Diagnosis not present

## 2016-01-09 DIAGNOSIS — B351 Tinea unguium: Secondary | ICD-10-CM | POA: Diagnosis not present

## 2016-01-17 DIAGNOSIS — I10 Essential (primary) hypertension: Secondary | ICD-10-CM | POA: Diagnosis not present

## 2016-01-17 DIAGNOSIS — E1129 Type 2 diabetes mellitus with other diabetic kidney complication: Secondary | ICD-10-CM | POA: Diagnosis not present

## 2016-01-17 DIAGNOSIS — E1143 Type 2 diabetes mellitus with diabetic autonomic (poly)neuropathy: Secondary | ICD-10-CM | POA: Diagnosis not present

## 2016-02-19 ENCOUNTER — Ambulatory Visit: Payer: Medicare Other | Admitting: Cardiology

## 2016-02-19 NOTE — Progress Notes (Deleted)
Clinical Summary Ms. Gage is a 83 y.o.female seen today for follow up of the following medical problems.   1. CAD - history of single vessel CABG 08/2014 (RIMA to RCA). In 2012 hx of DES to mid LAD. In 03/2013 history of additional DES to LAD.  - 07/2014 echo LVEF 0000000, grade I diastolic dysfunction  - denies any chest pain, no SOB  - now off plavix   2. Aortic valve stenosis - history of AVR Aug 2016 for aortic stenosis, she received a 21 mm [facility] ease pericardial tissue valve. - no recent SOB/DOE, no LE edema   3. Syncope - recent admission to [facility] with episode of syncope - from notes she had evidence of dehydration as well as UTI. Cr 1.79 on admission, down to 1.2 with IVFs.  - started on midodrine during that admission according to dishcarge summary, but patient unsure if she is taking.  - no recurrent episodes  4. Bradycardia - no significant dizziness, can have some weakness at times.     SH: multiple family members dealing with cancer including her son and great grandson. Her husband had a prior stroke, he is fairly dependent on her at home.    Past Medical History:  Diagnosis Date  . Anemia   . Aortic stenosis    very mild, echo 12/2010  . Blood transfusion   . Bradycardia, sinus   . CAD (coronary artery disease)    Interventions in the past  . Cancer ([place])    ""had hysterectomy for a 6 showing cancer; think that's pretty strong  . Complication of anesthesia    "felt like I was smothering once with mask on my face"  . Diabetes mellitus   . Dyslipidemia   . Ejection fraction    EF 65%, echo, April, 2012, aortic valve sclerosis with very slight gradient  . Femoral bruit    01/2011 hosp, but no pseudoaneurysm or AV fistula  . GERD (gastroesophageal reflux disease)   . Groin hematoma    April, 2011  . Hypertension   . Myocardial infarction 2011; 08/2010  . Osteoarthritis    arthroscopic surgery right knee February, 2012  .  Pancreas cyst    The patient has multiple pancreatic cysts.  This is followed at [facility]         . Polymyalgia rheumatica ([place])   . Preoperative evaluation to rule out surgical contraindication    Patient needs back surgery by Dr. Carloyn Manner, May, 2012                . RBBB (right bundle branch block)    old  . Renal insufficiency   . Schatzki's ring   . Shortness of breath on exertion   . Spinal stenosis    history of surgery for this  . Stroke ([place]) 01/18/11   "I've had 2 TIA's"     Allergies  Allergen Reactions  . Clarithromycin     REACTION: Vomiting and diarrhea.  . Codeine Nausea And Vomiting  . Fentanyl Other (See Comments)  . Morphine Nausea And Vomiting  . Statins Other (See Comments)  . Sulfa Antibiotics   . Atorvastatin Other (See Comments)    Made her knees buckle and muscles ache     Current Outpatient Prescriptions  Medication Sig Dispense Refill  . amLODipine (NORVASC) 10 MG tablet Take 10 mg by mouth daily.      Marland Kitchen aspirin 81 MG tablet Take 81 mg by mouth daily.      Marland Kitchen  Calcium Carbonate-Vitamin D (CALCIUM-VITAMIN D) 500-200 MG-UNIT tablet Take 1 tablet by mouth daily.    . Cholecalciferol (VITAMIN D-3) 1000 units CAPS Take 1 capsule by mouth daily.    . divalproex (DEPAKOTE) 500 MG 24 hr tablet Take 500 mg by mouth daily.      . furosemide (LASIX) 40 MG tablet Take 1 tablet (40 mg total) by mouth daily. 90 tablet 3  . HYDROcodone-acetaminophen (NORCO) 10-325 MG tablet Take 1 tablet by mouth every 6 (six) hours as needed.  0  . Insulin Glargine (LANTUS SOLOSTAR) 100 UNIT/ML Solostar Pen Inject 18 Units into the skin at bedtime.     . insulin lispro (HUMALOG KWIKPEN) 100 UNIT/ML KiwkPen Inject 7 Units into the skin 3 (three) times daily before meals.     . isosorbide mononitrate (IMDUR) 60 MG 24 hr tablet Take 60 mg by mouth daily.     Marland Kitchen latanoprost (XALATAN) 0.005 % ophthalmic solution Place 1 drop into both eyes at bedtime.      Marland Kitchen lisinopril (PRINIVIL,ZESTRIL) 10  MG tablet Take 10 mg by mouth daily.    . mirabegron ER (MYRBETRIQ) 25 MG TB24 tablet Take 25 mg by mouth 2 (two) times daily.     . pantoprazole (PROTONIX) 40 MG tablet Take 40 mg by mouth daily.    . potassium chloride SA (K-DUR,KLOR-CON) 20 MEQ tablet Take 20 mEq by mouth daily.    . predniSONE (DELTASONE) 5 MG tablet Take 5 mg by mouth daily. as directed  0  . rosuvastatin (CRESTOR) 20 MG tablet Take 20 mg by mouth daily.    . timolol (TIMOPTIC) 0.5 % ophthalmic solution Place 1 drop into both eyes daily.      No current facility-administered medications for this visit.      Past Surgical History:  Procedure Laterality Date  . ABDOMINAL HYSTERECTOMY  ~ 1974  . AORTIC VALVE REPLACEMENT N/A 08/22/2014   Procedure: AORTIC VALVE REPLACEMENT (AVR);  Surgeon: Melrose Nakayama, MD;  Location: [place];  Service: Open Heart Surgery;  Laterality: N/A;  . APPENDECTOMY    . BACK SURGERY    . CARDIAC CATHETERIZATION  01/18/11  . CARDIAC CATHETERIZATION N/A 08/16/2014   Procedure: Left Heart Cath and Coronary Angiography;  Surgeon: Leonie Man, MD;  Location: [place] CV LAB;  Service: Cardiovascular;  Laterality: N/A;  . CARDIAC CATHETERIZATION N/A 08/16/2014   Procedure: Coronary Stent Intervention;  Surgeon: Leonie Man, MD;  Location: [place] CV LAB;  Service: Cardiovascular;  Laterality: N/A;  . CARDIAC CATHETERIZATION N/A 08/17/2014   Procedure: Coronary/Graft Atherectomy;  Surgeon: Burnell Blanks, MD;  Location: [place] CV LAB;  Service: Cardiovascular;  Laterality: N/A;  . CHOLECYSTECTOMY  1987  . CORONARY ANGIOGRAM  01/18/2011   Procedure: CORONARY ANGIOGRAM;  Surgeon: Larey Dresser, MD;  Location: [facility] CATH LAB;  Service: Cardiovascular;;  . CORONARY ANGIOPLASTY WITH STENT PLACEMENT  2011   "in [place]"  . CORONARY ANGIOPLASTY WITH STENT PLACEMENT  08/2010; 03/25/2013  . CORONARY ARTERY BYPASS GRAFT N/A 08/22/2014   Procedure: CORONARY ARTERY BYPASS GRAFTING  (CABG), ON PUMP, TIMES ONE, USING RIGHT INTERNAL MAMMARY ARTERY;  Surgeon: Melrose Nakayama, MD;  Location: [place];  Service: Open Heart Surgery;  Laterality: N/A;  . DILATION AND CURETTAGE OF UTERUS    . FEMORAL ARTERY EXPLORATION Right 03/26/2013   Procedure: FEMORAL ARTERY EXPLORATION WITH SUTURE REPAIR; EVACUATION OF HEMATOMA;  Surgeon: Mal Misty, MD;  Location: [place];  Service: Vascular;  Laterality: Right;  . KNEE ARTHROSCOPY  02/2010   right knee; chrondoplasty medial femoral condyle;  Partial medial meniscectomy.   Marland Kitchen LEFT AND RIGHT HEART CATHETERIZATION WITH CORONARY ANGIOGRAM N/A 03/25/2013   Procedure: LEFT AND RIGHT HEART CATHETERIZATION WITH CORONARY ANGIOGRAM;  Surgeon: Burnell Blanks, MD;  Location: [place] CATH LAB;  Service: Cardiovascular;  Laterality: N/A;  . LUMBAR SPINE SURGERY  ~ 1977; ~ 2010   "2 hugh ruptured discs; I was paralyzed first OR; 2nd OR by Dr. Carloyn Manner, don't know what for"  . TEE WITHOUT CARDIOVERSION N/A 08/22/2014   Procedure: TRANSESOPHAGEAL ECHOCARDIOGRAM (TEE);  Surgeon: Melrose Nakayama, MD;  Location: [place];  Service: Open Heart Surgery;  Laterality: N/A;  . TUBAL LIGATION  1970's     Allergies  Allergen Reactions  . Clarithromycin     REACTION: Vomiting and diarrhea.  . Codeine Nausea And Vomiting  . Fentanyl Other (See Comments)  . Morphine Nausea And Vomiting  . Statins Other (See Comments)  . Sulfa Antibiotics   . Atorvastatin Other (See Comments)    Made her knees buckle and muscles ache      Family History  Problem Relation Age of Onset  . Uterine cancer Mother 64  . Other Father 80    cervical fracture     Social History Ms. Moorman reports that she has never smoked. She has never used smokeless tobacco. Ms. Uresti reports that she does not drink alcohol.   Review of Systems CONSTITUTIONAL: No weight loss, fever, chills, weakness or fatigue.  HEENT: Eyes: No visual loss, blurred vision, double vision or yellow  sclerae.No hearing loss, sneezing, congestion, runny nose or sore throat.  SKIN: No rash or itching.  CARDIOVASCULAR:  RESPIRATORY: No shortness of breath, cough or sputum.  GASTROINTESTINAL: No anorexia, nausea, vomiting or diarrhea. No abdominal pain or blood.  GENITOURINARY: No burning on urination, no polyuria NEUROLOGICAL: No headache, dizziness, syncope, paralysis, ataxia, numbness or tingling in the extremities. No change in bowel or bladder control.  MUSCULOSKELETAL: No muscle, back pain, joint pain or stiffness.  LYMPHATICS: No enlarged nodes. No history of splenectomy.  PSYCHIATRIC: No history of depression or anxiety.  ENDOCRINOLOGIC: No reports of sweating, cold or heat intolerance. No polyuria or polydipsia.  Marland Kitchen   Physical Examination There were no vitals filed for this visit. There were no vitals filed for this visit.  Gen: resting comfortably, no acute distress HEENT: no scleral icterus, pupils equal round and reactive, no palptable cervical adenopathy,  CV Resp: Clear to auscultation bilaterally GI: abdomen is soft, non-tender, non-distended, normal bowel sounds, no hepatosplenomegaly MSK: extremities are warm, no edema.  Skin: warm, no rash Neuro:  no focal deficits Psych: appropriate affect   Diagnostic Studies     Assessment and Plan   1. CAD - no current symptoms - we will continue current meds  2. Aortic valve disease - s/p recent replacement, doing well. We will continue to monitor  3. Syncope - episode looks to have been secondary to dehydration an hypovolemia, potentially some component of UTI played a role - no recurrent episodes - her discharge medication regimen remains unclear. Her discharge summary mentioned quite a few changes, including starting midodrine. Patient is unsure what she is actually taking at home - she will come in next week with all her pill bottles so we may clarify what she is taking. I would likely d/c midodrine if she is  taking, as it appears from the data I have her syncope  was secondary to dehydration as opposed to true autonomic dysfunction. If neccesary would lean down downtitrating bp meds prior to starting midodrine as well.   F/u 6 months. Nursing visit next week to clarify home meds.      Arnoldo Lenis, M.D., F.A.C.C.

## 2016-02-27 DIAGNOSIS — M47816 Spondylosis without myelopathy or radiculopathy, lumbar region: Secondary | ICD-10-CM | POA: Diagnosis not present

## 2016-03-18 DIAGNOSIS — Z794 Long term (current) use of insulin: Secondary | ICD-10-CM | POA: Diagnosis not present

## 2016-03-18 DIAGNOSIS — H401112 Primary open-angle glaucoma, right eye, moderate stage: Secondary | ICD-10-CM | POA: Diagnosis not present

## 2016-03-18 DIAGNOSIS — H401123 Primary open-angle glaucoma, left eye, severe stage: Secondary | ICD-10-CM | POA: Diagnosis not present

## 2016-03-18 DIAGNOSIS — E119 Type 2 diabetes mellitus without complications: Secondary | ICD-10-CM | POA: Diagnosis not present

## 2016-03-26 DIAGNOSIS — E1342 Other specified diabetes mellitus with diabetic polyneuropathy: Secondary | ICD-10-CM | POA: Diagnosis not present

## 2016-03-26 DIAGNOSIS — B351 Tinea unguium: Secondary | ICD-10-CM | POA: Diagnosis not present

## 2016-04-30 DIAGNOSIS — E1143 Type 2 diabetes mellitus with diabetic autonomic (poly)neuropathy: Secondary | ICD-10-CM | POA: Diagnosis not present

## 2016-04-30 DIAGNOSIS — I10 Essential (primary) hypertension: Secondary | ICD-10-CM | POA: Diagnosis not present

## 2016-05-28 DIAGNOSIS — M47816 Spondylosis without myelopathy or radiculopathy, lumbar region: Secondary | ICD-10-CM | POA: Diagnosis not present

## 2016-06-24 IMAGING — DX DG CHEST 2V
3 series · 3 of 3 positions shown · non-contrast
Comparison: 08/15/2014

CLINICAL DATA: Chest pain, left arm pain this morning.

EXAM:
CHEST  2 VIEW

[x chest ap]
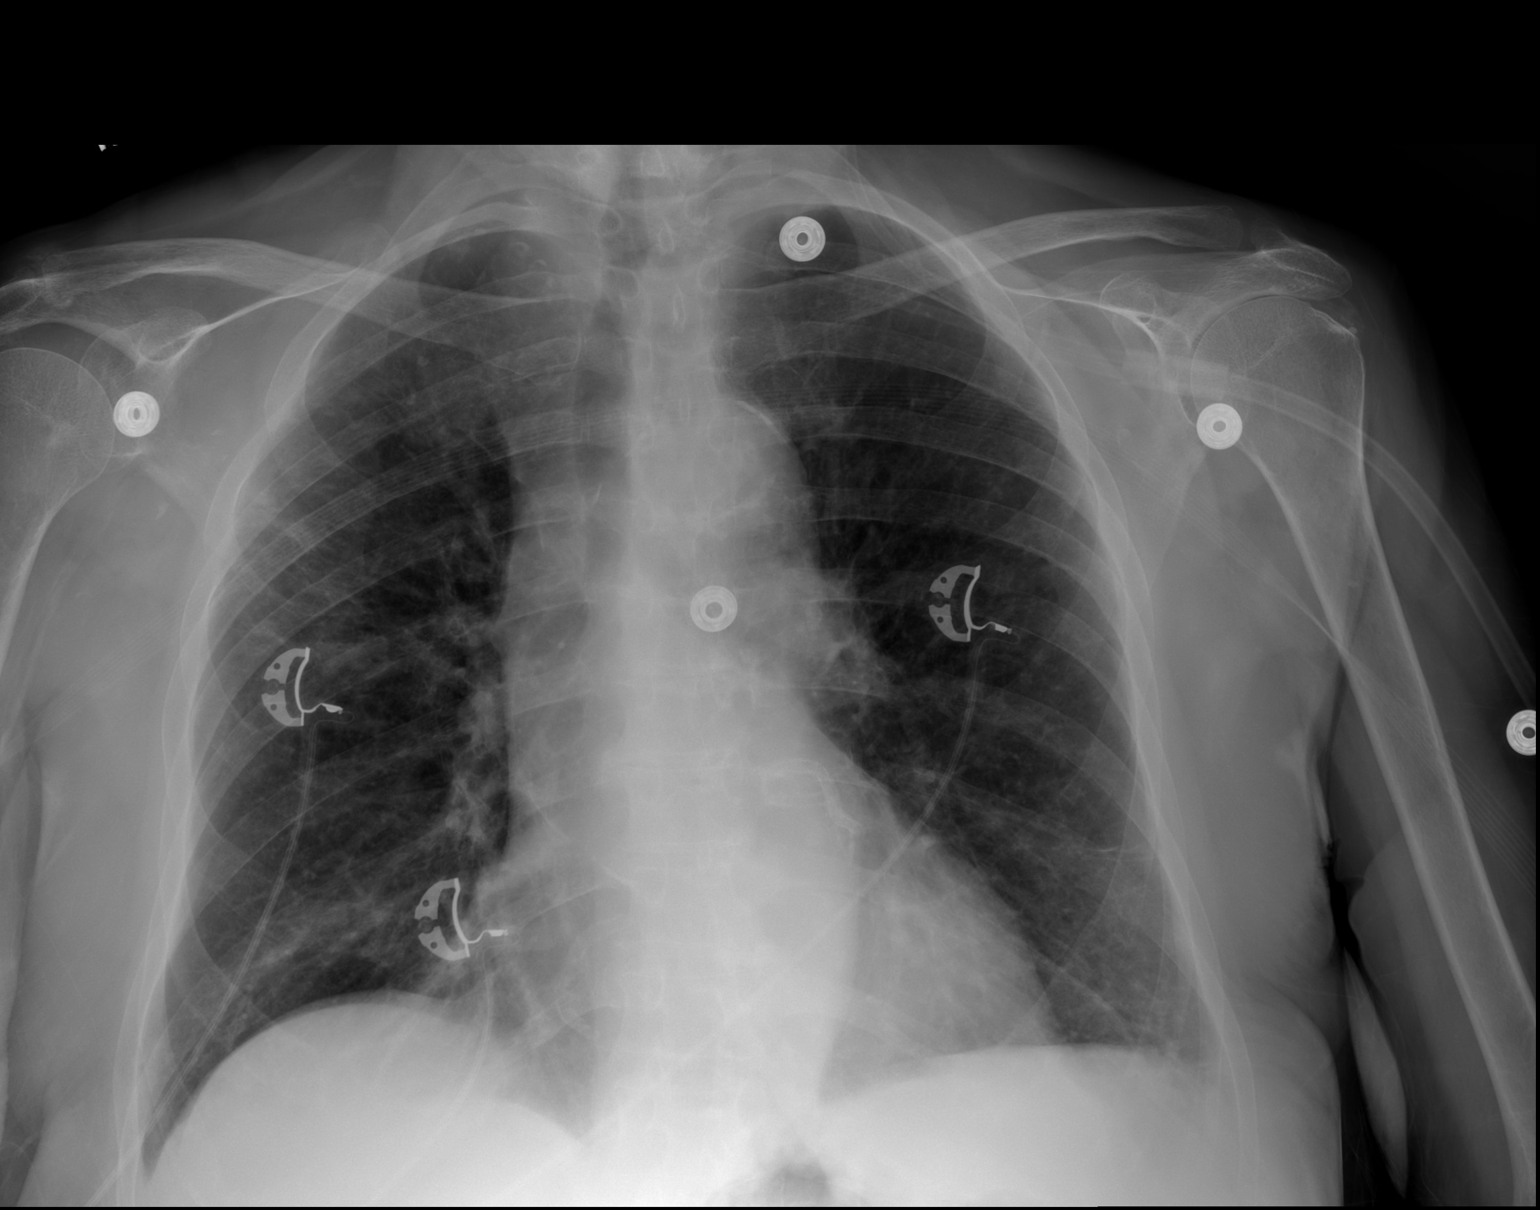

[w chest lat (1 of 2)]
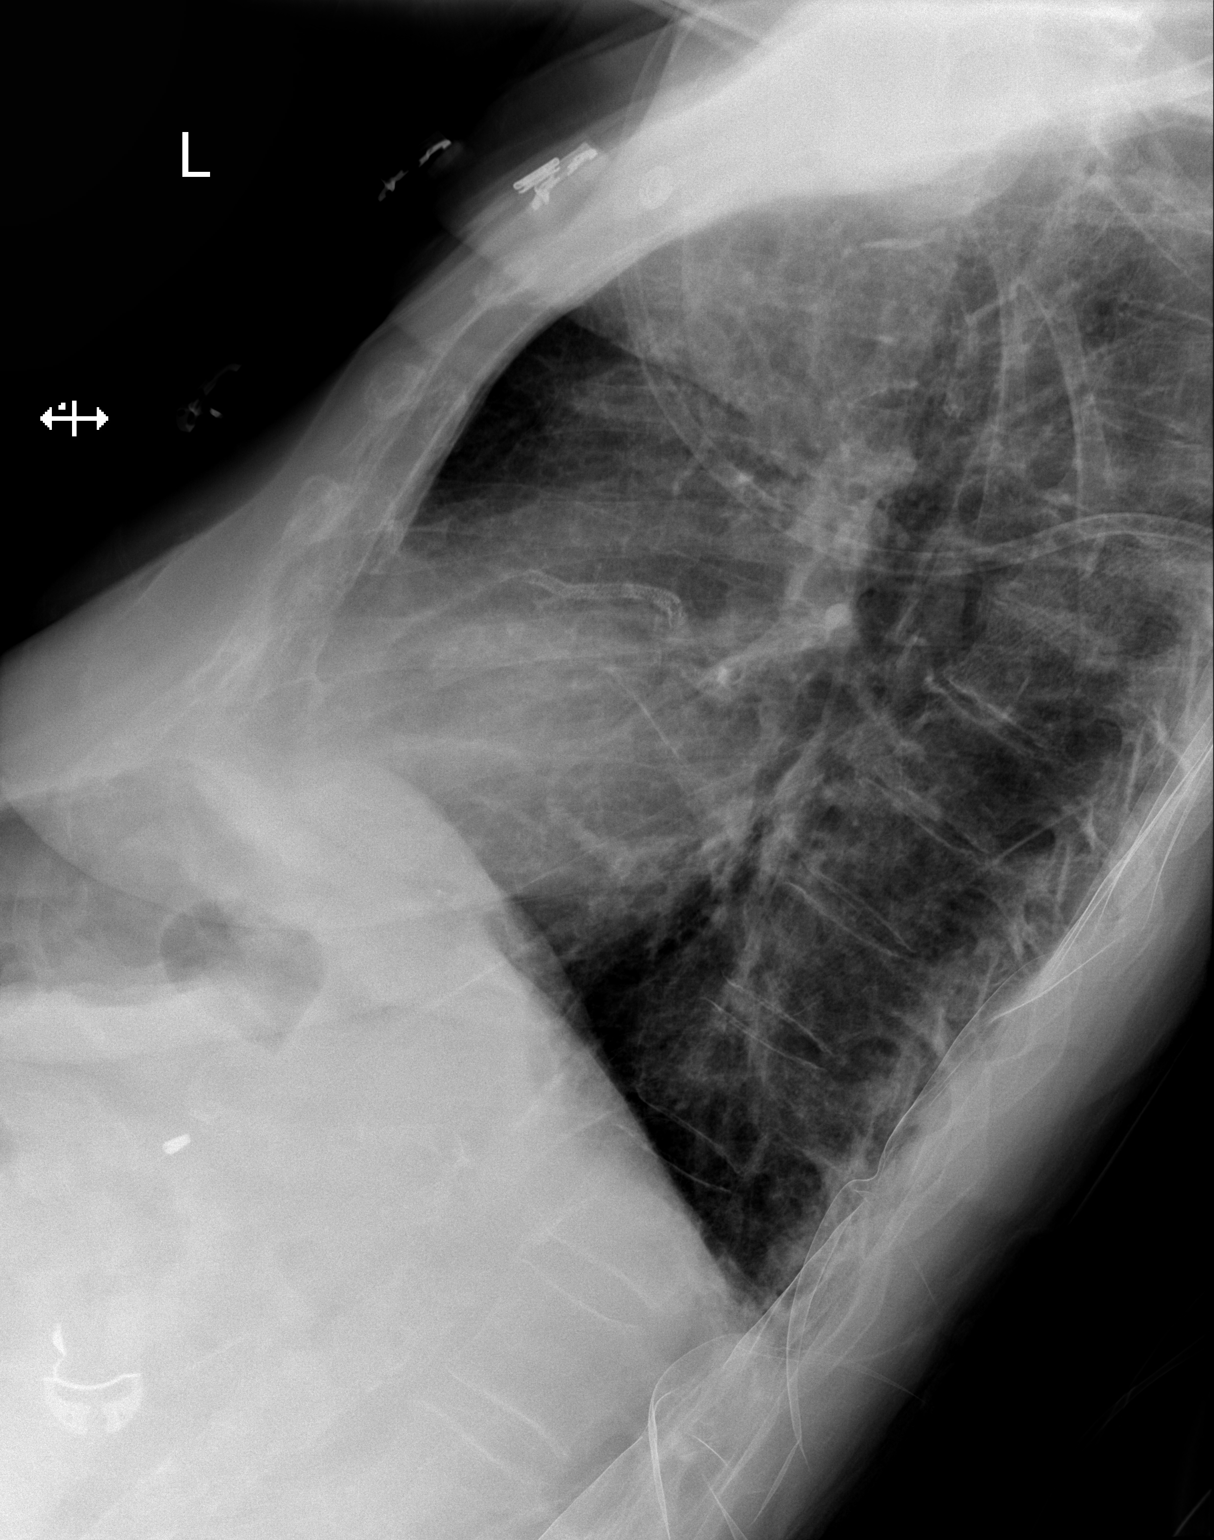

[w chest lat (2 of 2)]
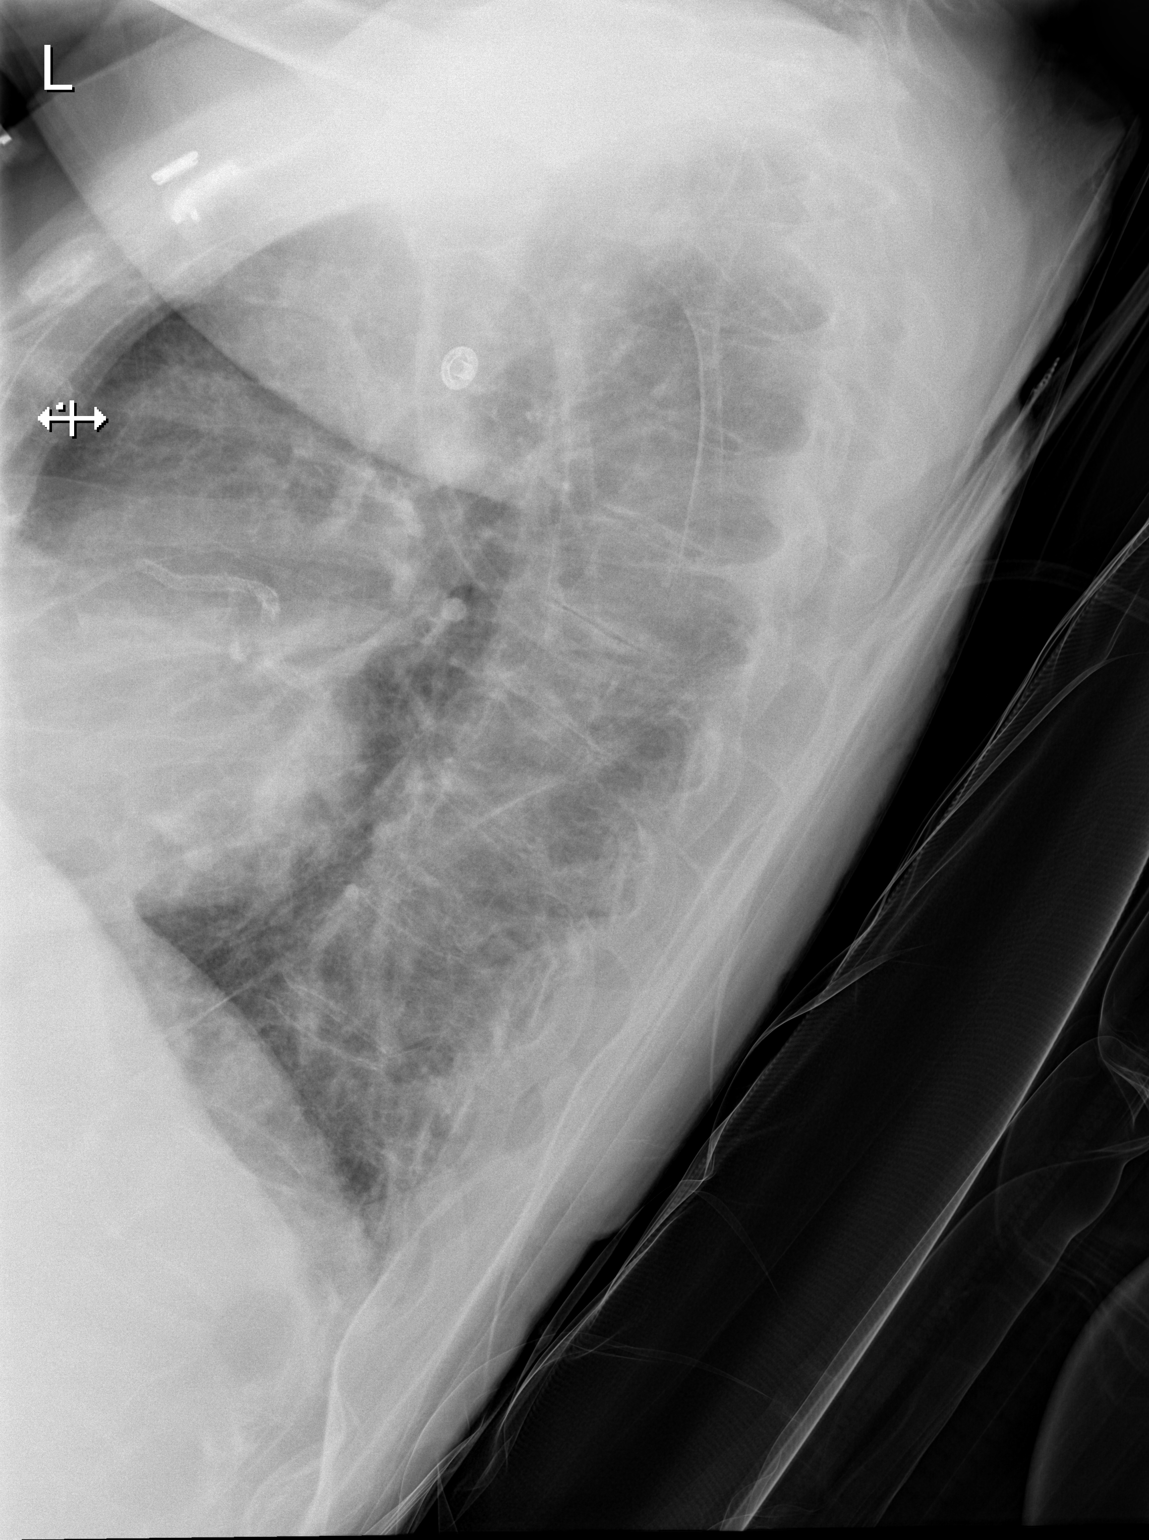

[3 of 3 positions shown; findings below may reference images not displayed]

FINDINGS: Areas of scarring in the upper lobes bilaterally. Heart is normal
size. Coronary stents are noted. No confluent airspace opacities or
effusions. No acute bony abnormality.
IMPRESSION: Bilateral upper lobe scarring.  No acute findings.

## 2016-06-26 IMAGING — CR DG CHEST 1V PORT
1 series · 1 of 1 positions shown · non-contrast
Comparison: Interval extubation of the trachea with reasonable
expansion of both lungs.

CLINICAL DATA: CHF, coronary artery disease status post angioplasty
and CABG, history of aortic valve replacement, diabetes.

EXAM:
PORTABLE CHEST - 1 VIEW

[AP]
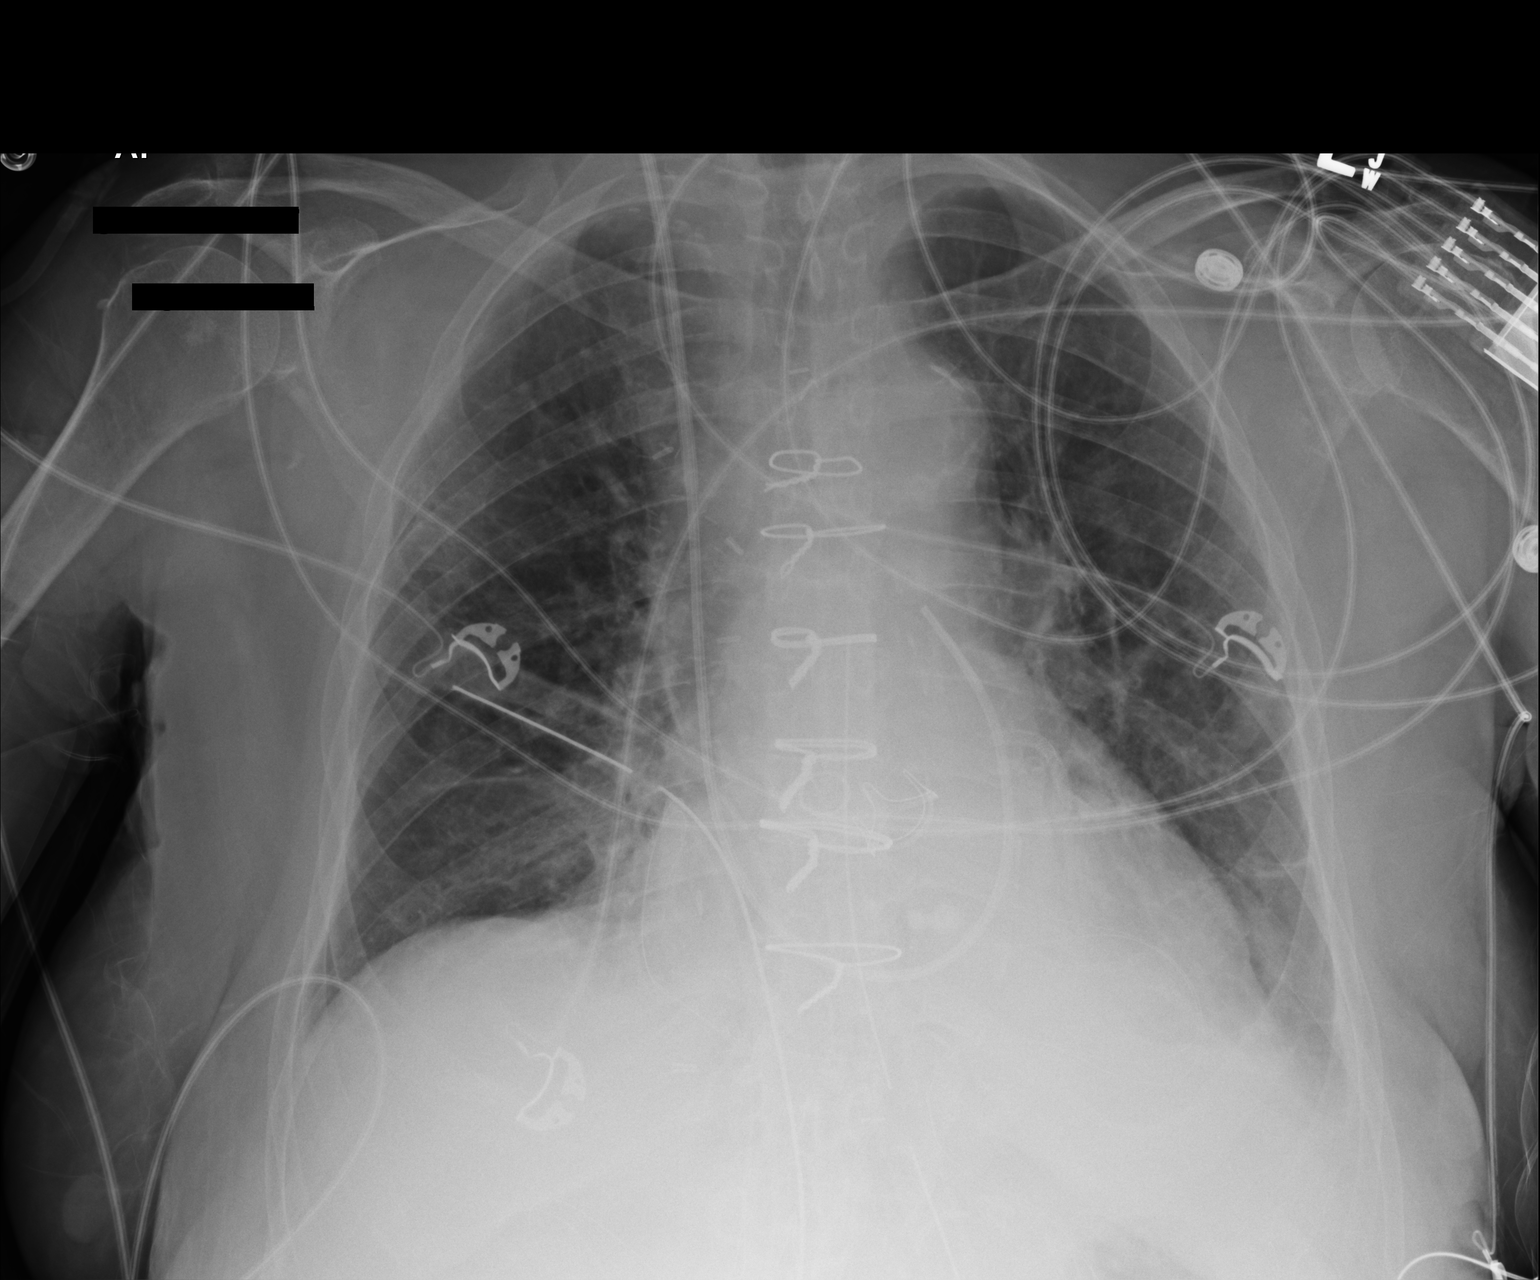

[1 of 1 positions shown; findings below may reference images not displayed]

There is persistent left lower lobe
atelectasis and small left pleural effusion. Pulmonary interstitial
edema has improved.
FINDINGS: There has been interval extubation of the trachea and of the
esophagus. The lungs are adequately inflated. There is persistent
left lower lobe atelectasis and small left pleural effusion. The
interstitial markings are less conspicuous bilaterally. There is no
pneumothorax. The right left-sided chest tube and the mediastinal
drain are unchanged. A lower left chest tube has apparently been
removed. The cardiac silhouette remains mildly enlarged. The
Swan-Ganz catheter tip overlies the distal main pulmonary artery
trunk. There are 6 intact sternal wires. The prosthetic aortic valve
ring is visible.
IMPRESSION: Interval improvement in interstitial edema. Aeration of the lungs
remains good.

## 2016-06-28 IMAGING — CR DG CHEST 2V
2 series · 2 of 2 positions shown · non-contrast
Comparison: 08/24/2014.

CLINICAL DATA: 81-year-old female post aortic valve replacement.
Weakness. Subsequent encounter.

EXAM:
CHEST  2 VIEW

[chest pa]
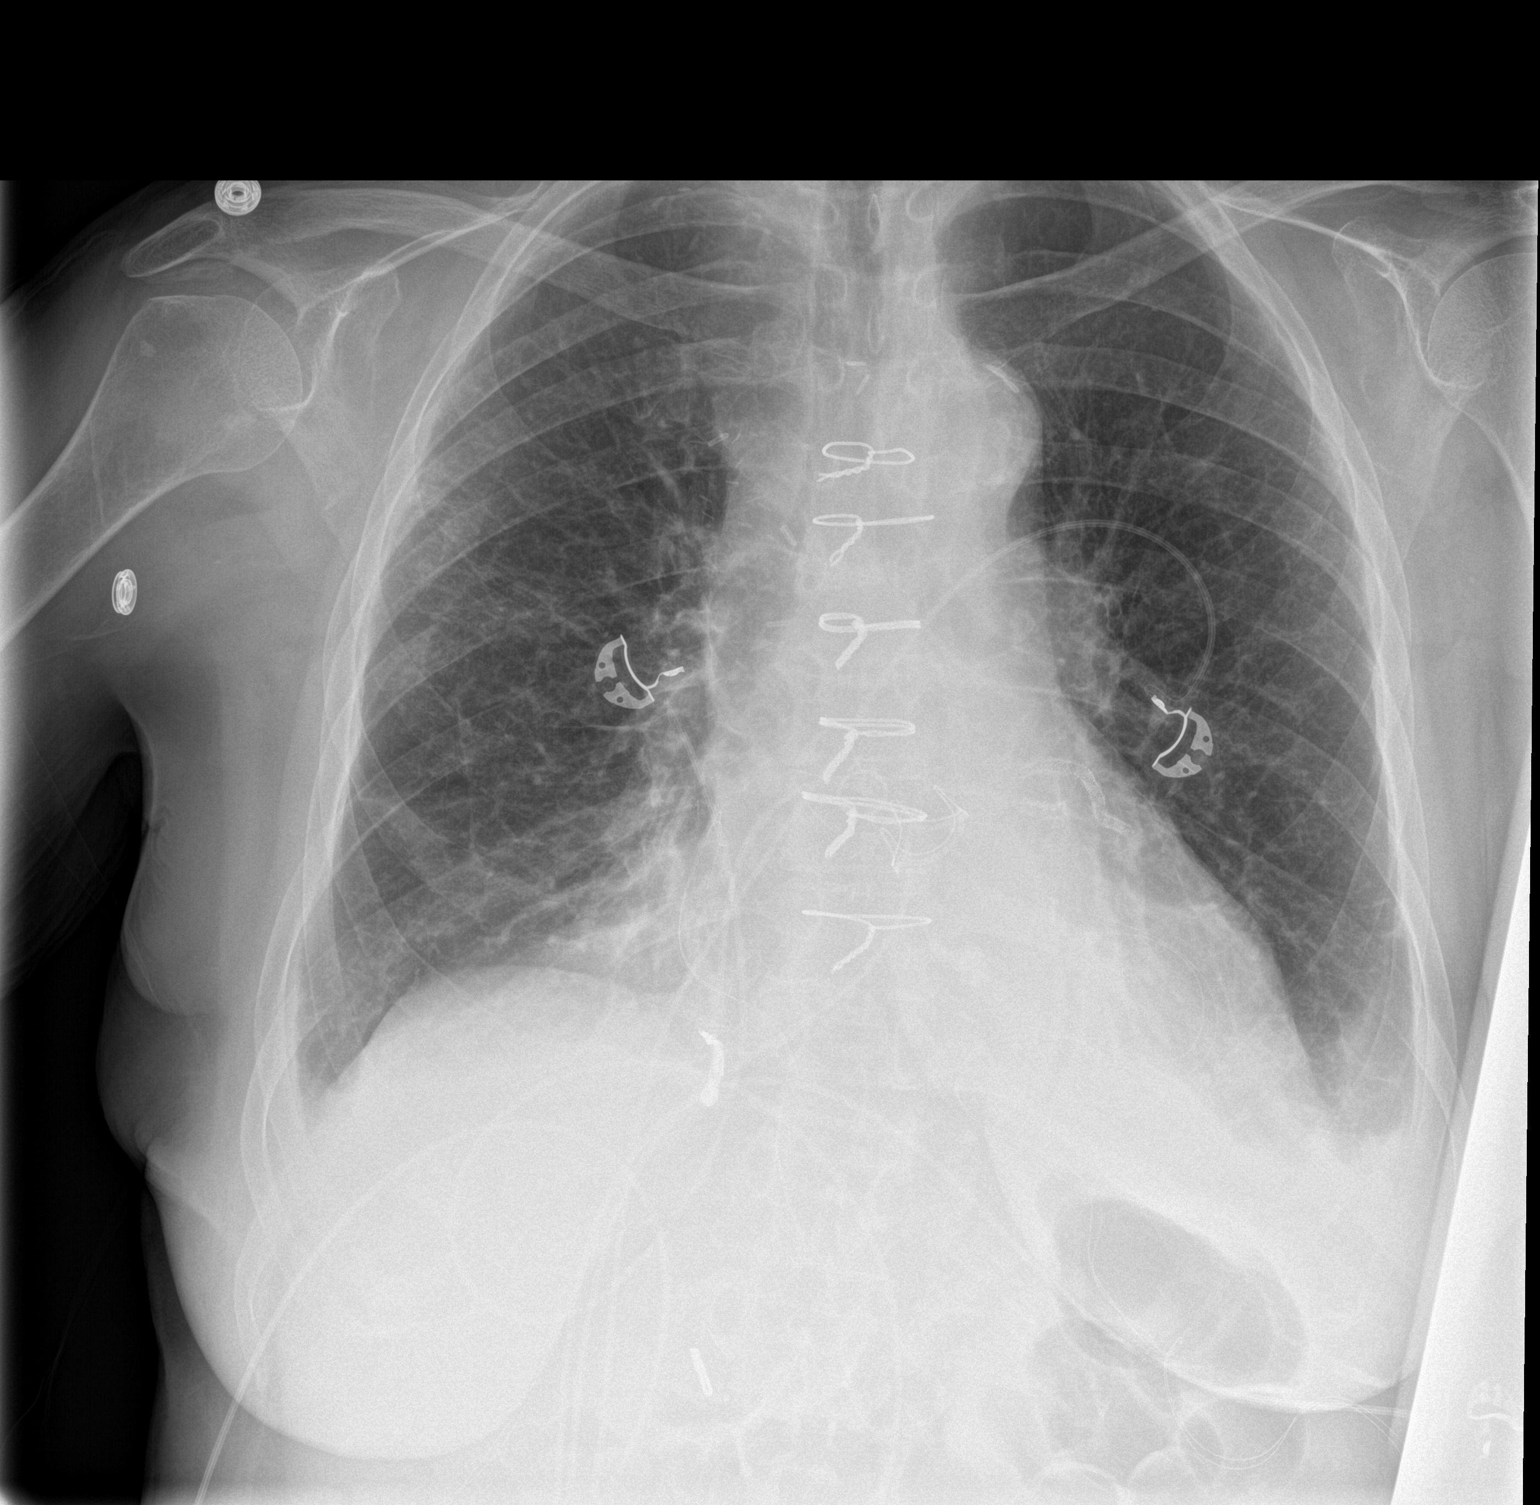

[chest lat]
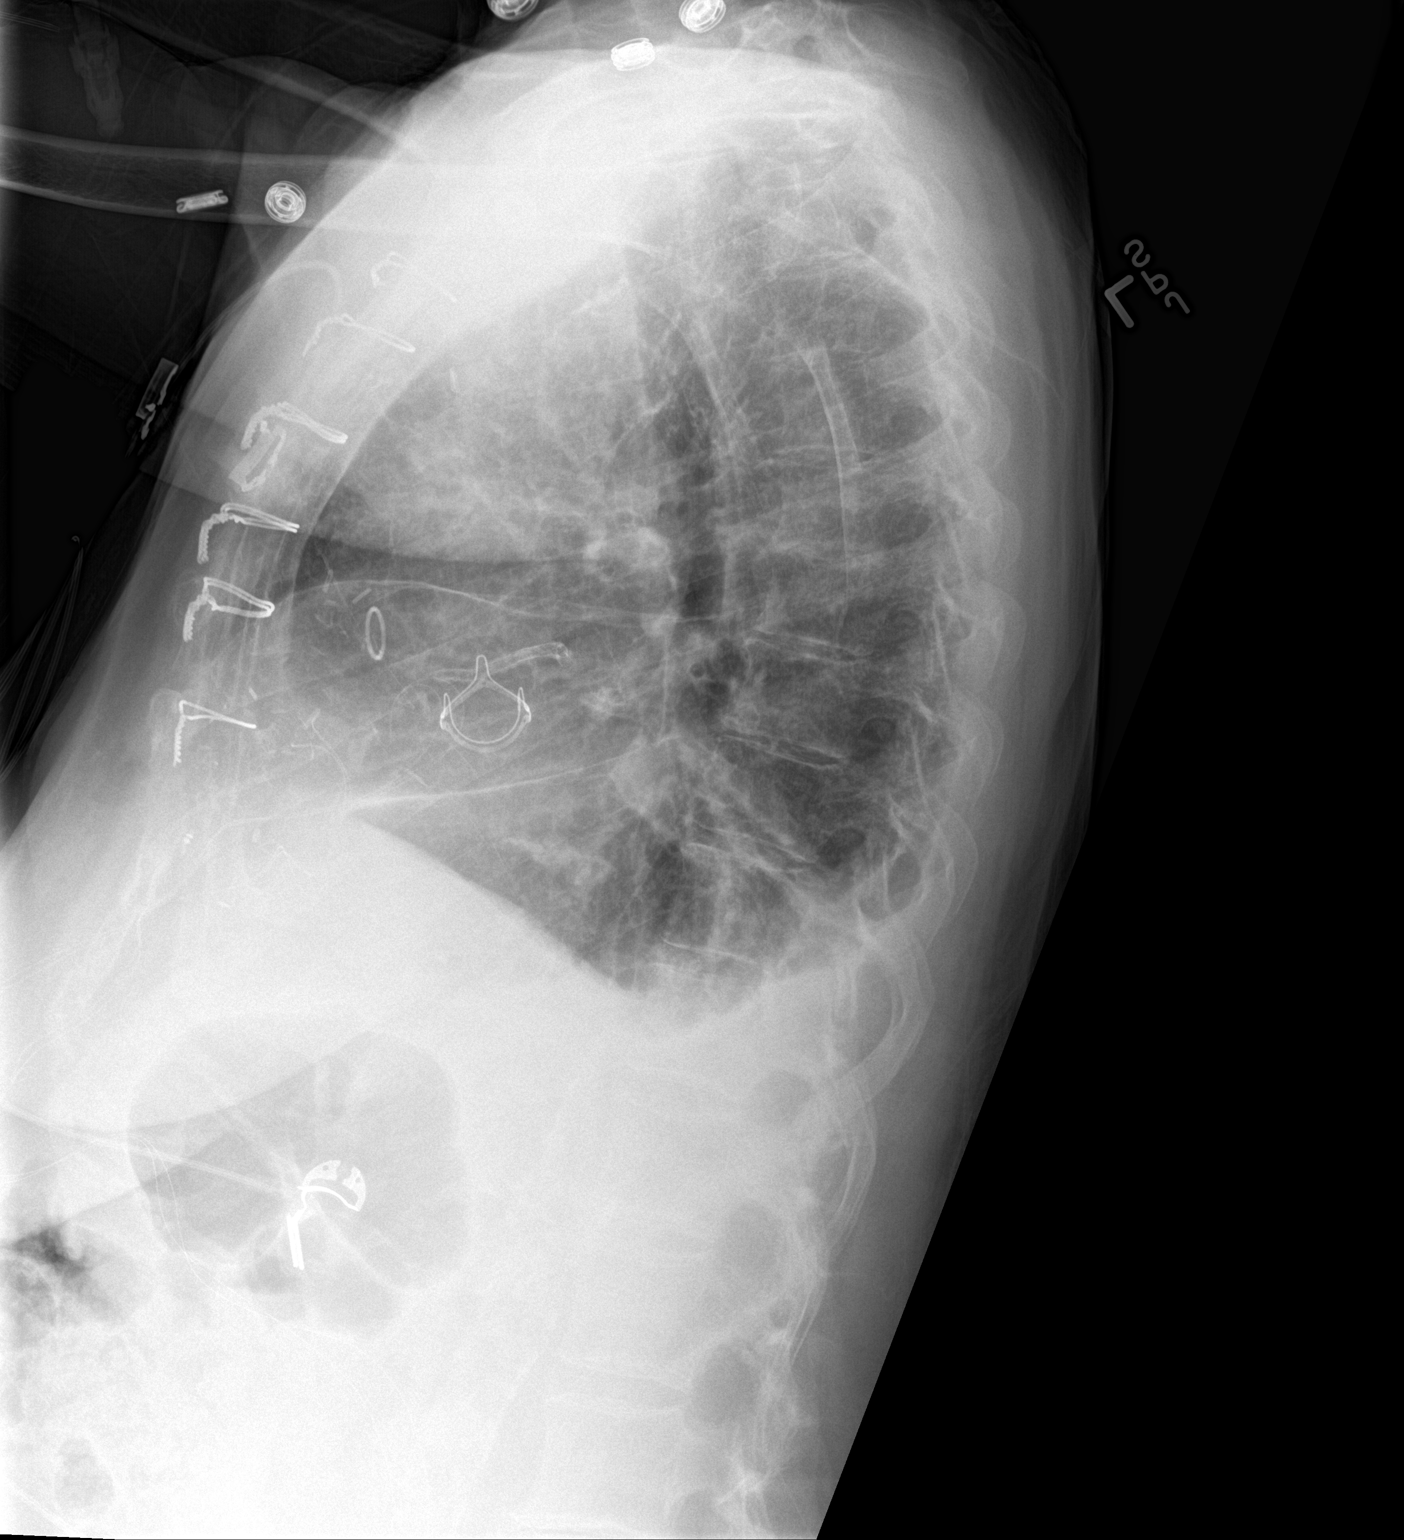

[2 of 2 positions shown; findings below may reference images not displayed]

FINDINGS: Post aortic valve replacement and CABG. Coronary artery
calcifications. Mild cardiomegaly. Epicardial leads in place.

Small bilateral pleural effusions greater on the left.

Central pulmonary vascular prominence without frank pulmonary edema.

Right central line removed without gross pneumothorax.

There is a lucency of the right supraclavicular region which may
represent overlying soft tissue rather than herniated lung or
subcutaneous air. Clinical correlation recommended.

Calcified aorta.
IMPRESSION: Post aortic valve replacement and CABG. Coronary artery
calcifications. Mild cardiomegaly.

Small bilateral pleural effusions greater on the left.

Central pulmonary vascular prominence without frank pulmonary edema.

There is a lucency of the right supraclavicular region which may
represent overlying soft tissue rather than herniated lung or
subcutaneous air. Clinical correlation recommended.

## 2016-07-01 DIAGNOSIS — N3000 Acute cystitis without hematuria: Secondary | ICD-10-CM | POA: Diagnosis not present

## 2016-07-02 DIAGNOSIS — I110 Hypertensive heart disease with heart failure: Secondary | ICD-10-CM | POA: Diagnosis not present

## 2016-07-02 DIAGNOSIS — R001 Bradycardia, unspecified: Secondary | ICD-10-CM | POA: Diagnosis not present

## 2016-07-02 DIAGNOSIS — N390 Urinary tract infection, site not specified: Secondary | ICD-10-CM | POA: Diagnosis not present

## 2016-07-02 DIAGNOSIS — I639 Cerebral infarction, unspecified: Secondary | ICD-10-CM | POA: Diagnosis not present

## 2016-07-02 DIAGNOSIS — I252 Old myocardial infarction: Secondary | ICD-10-CM | POA: Diagnosis not present

## 2016-07-02 DIAGNOSIS — Z955 Presence of coronary angioplasty implant and graft: Secondary | ICD-10-CM | POA: Diagnosis not present

## 2016-07-02 DIAGNOSIS — G8191 Hemiplegia, unspecified affecting right dominant side: Secondary | ICD-10-CM | POA: Diagnosis not present

## 2016-07-02 DIAGNOSIS — Z882 Allergy status to sulfonamides status: Secondary | ICD-10-CM | POA: Diagnosis not present

## 2016-07-02 DIAGNOSIS — H538 Other visual disturbances: Secondary | ICD-10-CM | POA: Diagnosis present

## 2016-07-02 DIAGNOSIS — I2510 Atherosclerotic heart disease of native coronary artery without angina pectoris: Secondary | ICD-10-CM | POA: Diagnosis present

## 2016-07-02 DIAGNOSIS — M353 Polymyalgia rheumatica: Secondary | ICD-10-CM | POA: Diagnosis present

## 2016-07-02 DIAGNOSIS — M6281 Muscle weakness (generalized): Secondary | ICD-10-CM | POA: Diagnosis not present

## 2016-07-02 DIAGNOSIS — E1122 Type 2 diabetes mellitus with diabetic chronic kidney disease: Secondary | ICD-10-CM | POA: Diagnosis present

## 2016-07-02 DIAGNOSIS — R4182 Altered mental status, unspecified: Secondary | ICD-10-CM | POA: Diagnosis not present

## 2016-07-02 DIAGNOSIS — Z952 Presence of prosthetic heart valve: Secondary | ICD-10-CM | POA: Diagnosis not present

## 2016-07-02 DIAGNOSIS — I130 Hypertensive heart and chronic kidney disease with heart failure and stage 1 through stage 4 chronic kidney disease, or unspecified chronic kidney disease: Secondary | ICD-10-CM | POA: Diagnosis not present

## 2016-07-02 DIAGNOSIS — M545 Low back pain: Secondary | ICD-10-CM | POA: Diagnosis present

## 2016-07-02 DIAGNOSIS — N182 Chronic kidney disease, stage 2 (mild): Secondary | ICD-10-CM | POA: Diagnosis not present

## 2016-07-02 DIAGNOSIS — Z881 Allergy status to other antibiotic agents status: Secondary | ICD-10-CM | POA: Diagnosis not present

## 2016-07-02 DIAGNOSIS — G8111 Spastic hemiplegia affecting right dominant side: Secondary | ICD-10-CM | POA: Diagnosis not present

## 2016-07-02 DIAGNOSIS — E1165 Type 2 diabetes mellitus with hyperglycemia: Secondary | ICD-10-CM | POA: Diagnosis not present

## 2016-07-02 DIAGNOSIS — E162 Hypoglycemia, unspecified: Secondary | ICD-10-CM | POA: Diagnosis not present

## 2016-07-02 DIAGNOSIS — N183 Chronic kidney disease, stage 3 (moderate): Secondary | ICD-10-CM | POA: Diagnosis not present

## 2016-07-02 DIAGNOSIS — E785 Hyperlipidemia, unspecified: Secondary | ICD-10-CM | POA: Diagnosis not present

## 2016-07-02 DIAGNOSIS — I509 Heart failure, unspecified: Secondary | ICD-10-CM | POA: Diagnosis not present

## 2016-07-02 DIAGNOSIS — G40909 Epilepsy, unspecified, not intractable, without status epilepticus: Secondary | ICD-10-CM | POA: Diagnosis present

## 2016-07-02 DIAGNOSIS — F319 Bipolar disorder, unspecified: Secondary | ICD-10-CM | POA: Diagnosis present

## 2016-07-02 DIAGNOSIS — G8929 Other chronic pain: Secondary | ICD-10-CM | POA: Diagnosis present

## 2016-07-02 DIAGNOSIS — B961 Klebsiella pneumoniae [K. pneumoniae] as the cause of diseases classified elsewhere: Secondary | ICD-10-CM | POA: Diagnosis present

## 2016-07-02 DIAGNOSIS — I5032 Chronic diastolic (congestive) heart failure: Secondary | ICD-10-CM | POA: Diagnosis not present

## 2016-07-02 DIAGNOSIS — H409 Unspecified glaucoma: Secondary | ICD-10-CM | POA: Diagnosis not present

## 2016-07-02 DIAGNOSIS — R5383 Other fatigue: Secondary | ICD-10-CM | POA: Diagnosis not present

## 2016-07-02 DIAGNOSIS — K219 Gastro-esophageal reflux disease without esophagitis: Secondary | ICD-10-CM | POA: Diagnosis not present

## 2016-07-02 DIAGNOSIS — I63331 Cerebral infarction due to thrombosis of right posterior cerebral artery: Secondary | ICD-10-CM | POA: Diagnosis not present

## 2016-07-02 DIAGNOSIS — Z886 Allergy status to analgesic agent status: Secondary | ICD-10-CM | POA: Diagnosis not present

## 2016-07-02 DIAGNOSIS — D696 Thrombocytopenia, unspecified: Secondary | ICD-10-CM | POA: Diagnosis not present

## 2016-07-02 DIAGNOSIS — N189 Chronic kidney disease, unspecified: Secondary | ICD-10-CM | POA: Diagnosis not present

## 2016-07-02 DIAGNOSIS — G4700 Insomnia, unspecified: Secondary | ICD-10-CM | POA: Diagnosis not present

## 2016-07-02 DIAGNOSIS — R2689 Other abnormalities of gait and mobility: Secondary | ICD-10-CM | POA: Diagnosis not present

## 2016-07-02 DIAGNOSIS — Z888 Allergy status to other drugs, medicaments and biological substances status: Secondary | ICD-10-CM | POA: Diagnosis not present

## 2016-07-02 DIAGNOSIS — I638 Other cerebral infarction: Secondary | ICD-10-CM | POA: Diagnosis not present

## 2016-07-02 DIAGNOSIS — R270 Ataxia, unspecified: Secondary | ICD-10-CM | POA: Diagnosis present

## 2016-07-09 DIAGNOSIS — M353 Polymyalgia rheumatica: Secondary | ICD-10-CM | POA: Diagnosis not present

## 2016-07-09 DIAGNOSIS — I509 Heart failure, unspecified: Secondary | ICD-10-CM | POA: Diagnosis not present

## 2016-07-09 DIAGNOSIS — F319 Bipolar disorder, unspecified: Secondary | ICD-10-CM | POA: Diagnosis not present

## 2016-07-09 DIAGNOSIS — I639 Cerebral infarction, unspecified: Secondary | ICD-10-CM | POA: Diagnosis not present

## 2016-07-09 DIAGNOSIS — Z8673 Personal history of transient ischemic attack (TIA), and cerebral infarction without residual deficits: Secondary | ICD-10-CM | POA: Diagnosis not present

## 2016-07-09 DIAGNOSIS — M6281 Muscle weakness (generalized): Secondary | ICD-10-CM | POA: Diagnosis not present

## 2016-07-09 DIAGNOSIS — I110 Hypertensive heart disease with heart failure: Secondary | ICD-10-CM | POA: Diagnosis not present

## 2016-07-09 DIAGNOSIS — G4700 Insomnia, unspecified: Secondary | ICD-10-CM | POA: Diagnosis not present

## 2016-07-09 DIAGNOSIS — M545 Low back pain: Secondary | ICD-10-CM | POA: Diagnosis not present

## 2016-07-09 DIAGNOSIS — R2689 Other abnormalities of gait and mobility: Secondary | ICD-10-CM | POA: Diagnosis not present

## 2016-07-09 DIAGNOSIS — H409 Unspecified glaucoma: Secondary | ICD-10-CM | POA: Diagnosis not present

## 2016-07-09 DIAGNOSIS — G8111 Spastic hemiplegia affecting right dominant side: Secondary | ICD-10-CM | POA: Diagnosis not present

## 2016-07-09 DIAGNOSIS — C44329 Squamous cell carcinoma of skin of other parts of face: Secondary | ICD-10-CM | POA: Diagnosis not present

## 2016-07-09 DIAGNOSIS — N183 Chronic kidney disease, stage 3 (moderate): Secondary | ICD-10-CM | POA: Diagnosis not present

## 2016-07-09 DIAGNOSIS — I5032 Chronic diastolic (congestive) heart failure: Secondary | ICD-10-CM | POA: Diagnosis not present

## 2016-07-09 DIAGNOSIS — F3189 Other bipolar disorder: Secondary | ICD-10-CM | POA: Diagnosis not present

## 2016-07-09 DIAGNOSIS — R4189 Other symptoms and signs involving cognitive functions and awareness: Secondary | ICD-10-CM | POA: Diagnosis not present

## 2016-07-09 DIAGNOSIS — Z952 Presence of prosthetic heart valve: Secondary | ICD-10-CM | POA: Diagnosis not present

## 2016-07-09 DIAGNOSIS — Z886 Allergy status to analgesic agent status: Secondary | ICD-10-CM | POA: Diagnosis not present

## 2016-07-09 DIAGNOSIS — Z881 Allergy status to other antibiotic agents status: Secondary | ICD-10-CM | POA: Diagnosis not present

## 2016-07-09 DIAGNOSIS — I638 Other cerebral infarction: Secondary | ICD-10-CM | POA: Diagnosis not present

## 2016-07-09 DIAGNOSIS — K219 Gastro-esophageal reflux disease without esophagitis: Secondary | ICD-10-CM | POA: Diagnosis not present

## 2016-07-09 DIAGNOSIS — N390 Urinary tract infection, site not specified: Secondary | ICD-10-CM | POA: Diagnosis not present

## 2016-07-09 DIAGNOSIS — E785 Hyperlipidemia, unspecified: Secondary | ICD-10-CM | POA: Diagnosis not present

## 2016-07-09 DIAGNOSIS — Z882 Allergy status to sulfonamides status: Secondary | ICD-10-CM | POA: Diagnosis not present

## 2016-07-09 DIAGNOSIS — I2510 Atherosclerotic heart disease of native coronary artery without angina pectoris: Secondary | ICD-10-CM | POA: Diagnosis not present

## 2016-08-25 DIAGNOSIS — R4189 Other symptoms and signs involving cognitive functions and awareness: Secondary | ICD-10-CM | POA: Diagnosis not present

## 2016-08-25 DIAGNOSIS — F3189 Other bipolar disorder: Secondary | ICD-10-CM | POA: Diagnosis not present

## 2016-08-25 DIAGNOSIS — Z8673 Personal history of transient ischemic attack (TIA), and cerebral infarction without residual deficits: Secondary | ICD-10-CM | POA: Diagnosis not present

## 2016-08-29 DIAGNOSIS — C44329 Squamous cell carcinoma of skin of other parts of face: Secondary | ICD-10-CM | POA: Diagnosis not present

## 2016-09-09 DIAGNOSIS — I5032 Chronic diastolic (congestive) heart failure: Secondary | ICD-10-CM | POA: Diagnosis not present

## 2016-09-09 DIAGNOSIS — I69393 Ataxia following cerebral infarction: Secondary | ICD-10-CM | POA: Diagnosis not present

## 2016-09-09 DIAGNOSIS — I2510 Atherosclerotic heart disease of native coronary artery without angina pectoris: Secondary | ICD-10-CM | POA: Diagnosis not present

## 2016-09-09 DIAGNOSIS — E119 Type 2 diabetes mellitus without complications: Secondary | ICD-10-CM | POA: Diagnosis not present

## 2016-09-09 DIAGNOSIS — I110 Hypertensive heart disease with heart failure: Secondary | ICD-10-CM | POA: Diagnosis not present

## 2016-09-09 DIAGNOSIS — I69351 Hemiplegia and hemiparesis following cerebral infarction affecting right dominant side: Secondary | ICD-10-CM | POA: Diagnosis not present

## 2016-09-10 DIAGNOSIS — E119 Type 2 diabetes mellitus without complications: Secondary | ICD-10-CM | POA: Diagnosis not present

## 2016-09-10 DIAGNOSIS — I2510 Atherosclerotic heart disease of native coronary artery without angina pectoris: Secondary | ICD-10-CM | POA: Diagnosis not present

## 2016-09-10 DIAGNOSIS — I69393 Ataxia following cerebral infarction: Secondary | ICD-10-CM | POA: Diagnosis not present

## 2016-09-10 DIAGNOSIS — I5032 Chronic diastolic (congestive) heart failure: Secondary | ICD-10-CM | POA: Diagnosis not present

## 2016-09-10 DIAGNOSIS — I69351 Hemiplegia and hemiparesis following cerebral infarction affecting right dominant side: Secondary | ICD-10-CM | POA: Diagnosis not present

## 2016-09-10 DIAGNOSIS — I110 Hypertensive heart disease with heart failure: Secondary | ICD-10-CM | POA: Diagnosis not present

## 2016-09-12 DIAGNOSIS — I5022 Chronic systolic (congestive) heart failure: Secondary | ICD-10-CM | POA: Diagnosis not present

## 2016-09-12 DIAGNOSIS — A044 Other intestinal Escherichia coli infections: Secondary | ICD-10-CM | POA: Diagnosis not present

## 2016-09-12 DIAGNOSIS — I10 Essential (primary) hypertension: Secondary | ICD-10-CM | POA: Diagnosis not present

## 2016-09-12 DIAGNOSIS — K219 Gastro-esophageal reflux disease without esophagitis: Secondary | ICD-10-CM | POA: Diagnosis not present

## 2016-09-12 DIAGNOSIS — E1165 Type 2 diabetes mellitus with hyperglycemia: Secondary | ICD-10-CM | POA: Diagnosis not present

## 2016-09-13 DIAGNOSIS — I110 Hypertensive heart disease with heart failure: Secondary | ICD-10-CM | POA: Diagnosis not present

## 2016-09-13 DIAGNOSIS — I69351 Hemiplegia and hemiparesis following cerebral infarction affecting right dominant side: Secondary | ICD-10-CM | POA: Diagnosis not present

## 2016-09-13 DIAGNOSIS — I69393 Ataxia following cerebral infarction: Secondary | ICD-10-CM | POA: Diagnosis not present

## 2016-09-13 DIAGNOSIS — I5032 Chronic diastolic (congestive) heart failure: Secondary | ICD-10-CM | POA: Diagnosis not present

## 2016-09-13 DIAGNOSIS — E119 Type 2 diabetes mellitus without complications: Secondary | ICD-10-CM | POA: Diagnosis not present

## 2016-09-13 DIAGNOSIS — I2510 Atherosclerotic heart disease of native coronary artery without angina pectoris: Secondary | ICD-10-CM | POA: Diagnosis not present

## 2016-09-14 DIAGNOSIS — Z8249 Family history of ischemic heart disease and other diseases of the circulatory system: Secondary | ICD-10-CM | POA: Diagnosis not present

## 2016-09-14 DIAGNOSIS — Z8541 Personal history of malignant neoplasm of cervix uteri: Secondary | ICD-10-CM | POA: Diagnosis not present

## 2016-09-14 DIAGNOSIS — Z794 Long term (current) use of insulin: Secondary | ICD-10-CM | POA: Diagnosis not present

## 2016-09-14 DIAGNOSIS — I2510 Atherosclerotic heart disease of native coronary artery without angina pectoris: Secondary | ICD-10-CM | POA: Diagnosis not present

## 2016-09-14 DIAGNOSIS — I252 Old myocardial infarction: Secondary | ICD-10-CM | POA: Diagnosis not present

## 2016-09-14 DIAGNOSIS — G40909 Epilepsy, unspecified, not intractable, without status epilepticus: Secondary | ICD-10-CM | POA: Diagnosis not present

## 2016-09-14 DIAGNOSIS — Z79899 Other long term (current) drug therapy: Secondary | ICD-10-CM | POA: Diagnosis not present

## 2016-09-14 DIAGNOSIS — Z7982 Long term (current) use of aspirin: Secondary | ICD-10-CM | POA: Diagnosis not present

## 2016-09-14 DIAGNOSIS — M545 Low back pain: Secondary | ICD-10-CM | POA: Diagnosis not present

## 2016-09-14 DIAGNOSIS — E119 Type 2 diabetes mellitus without complications: Secondary | ICD-10-CM | POA: Diagnosis not present

## 2016-09-14 DIAGNOSIS — Z833 Family history of diabetes mellitus: Secondary | ICD-10-CM | POA: Diagnosis not present

## 2016-09-14 DIAGNOSIS — R109 Unspecified abdominal pain: Secondary | ICD-10-CM | POA: Diagnosis not present

## 2016-09-14 DIAGNOSIS — I10 Essential (primary) hypertension: Secondary | ICD-10-CM | POA: Diagnosis not present

## 2016-09-14 DIAGNOSIS — Z7902 Long term (current) use of antithrombotics/antiplatelets: Secondary | ICD-10-CM | POA: Diagnosis not present

## 2016-09-14 DIAGNOSIS — K5900 Constipation, unspecified: Secondary | ICD-10-CM | POA: Diagnosis not present

## 2016-09-16 DIAGNOSIS — I5032 Chronic diastolic (congestive) heart failure: Secondary | ICD-10-CM | POA: Diagnosis not present

## 2016-09-16 DIAGNOSIS — I69351 Hemiplegia and hemiparesis following cerebral infarction affecting right dominant side: Secondary | ICD-10-CM | POA: Diagnosis not present

## 2016-09-16 DIAGNOSIS — I110 Hypertensive heart disease with heart failure: Secondary | ICD-10-CM | POA: Diagnosis not present

## 2016-09-16 DIAGNOSIS — I2510 Atherosclerotic heart disease of native coronary artery without angina pectoris: Secondary | ICD-10-CM | POA: Diagnosis not present

## 2016-09-16 DIAGNOSIS — E119 Type 2 diabetes mellitus without complications: Secondary | ICD-10-CM | POA: Diagnosis not present

## 2016-09-16 DIAGNOSIS — I69393 Ataxia following cerebral infarction: Secondary | ICD-10-CM | POA: Diagnosis not present

## 2016-09-17 DIAGNOSIS — I5032 Chronic diastolic (congestive) heart failure: Secondary | ICD-10-CM | POA: Diagnosis not present

## 2016-09-17 DIAGNOSIS — I69393 Ataxia following cerebral infarction: Secondary | ICD-10-CM | POA: Diagnosis not present

## 2016-09-17 DIAGNOSIS — I110 Hypertensive heart disease with heart failure: Secondary | ICD-10-CM | POA: Diagnosis not present

## 2016-09-17 DIAGNOSIS — E119 Type 2 diabetes mellitus without complications: Secondary | ICD-10-CM | POA: Diagnosis not present

## 2016-09-17 DIAGNOSIS — I2510 Atherosclerotic heart disease of native coronary artery without angina pectoris: Secondary | ICD-10-CM | POA: Diagnosis not present

## 2016-09-17 DIAGNOSIS — I69351 Hemiplegia and hemiparesis following cerebral infarction affecting right dominant side: Secondary | ICD-10-CM | POA: Diagnosis not present

## 2016-09-18 DIAGNOSIS — I5032 Chronic diastolic (congestive) heart failure: Secondary | ICD-10-CM | POA: Diagnosis not present

## 2016-09-18 DIAGNOSIS — I69351 Hemiplegia and hemiparesis following cerebral infarction affecting right dominant side: Secondary | ICD-10-CM | POA: Diagnosis not present

## 2016-09-18 DIAGNOSIS — I110 Hypertensive heart disease with heart failure: Secondary | ICD-10-CM | POA: Diagnosis not present

## 2016-09-18 DIAGNOSIS — I69393 Ataxia following cerebral infarction: Secondary | ICD-10-CM | POA: Diagnosis not present

## 2016-09-18 DIAGNOSIS — E119 Type 2 diabetes mellitus without complications: Secondary | ICD-10-CM | POA: Diagnosis not present

## 2016-09-18 DIAGNOSIS — I2510 Atherosclerotic heart disease of native coronary artery without angina pectoris: Secondary | ICD-10-CM | POA: Diagnosis not present

## 2016-09-19 DIAGNOSIS — I2510 Atherosclerotic heart disease of native coronary artery without angina pectoris: Secondary | ICD-10-CM | POA: Diagnosis not present

## 2016-09-19 DIAGNOSIS — I69393 Ataxia following cerebral infarction: Secondary | ICD-10-CM | POA: Diagnosis not present

## 2016-09-19 DIAGNOSIS — I110 Hypertensive heart disease with heart failure: Secondary | ICD-10-CM | POA: Diagnosis not present

## 2016-09-19 DIAGNOSIS — E119 Type 2 diabetes mellitus without complications: Secondary | ICD-10-CM | POA: Diagnosis not present

## 2016-09-19 DIAGNOSIS — I5032 Chronic diastolic (congestive) heart failure: Secondary | ICD-10-CM | POA: Diagnosis not present

## 2016-09-19 DIAGNOSIS — I69351 Hemiplegia and hemiparesis following cerebral infarction affecting right dominant side: Secondary | ICD-10-CM | POA: Diagnosis not present

## 2016-09-23 DIAGNOSIS — I5032 Chronic diastolic (congestive) heart failure: Secondary | ICD-10-CM | POA: Diagnosis not present

## 2016-09-23 DIAGNOSIS — I69393 Ataxia following cerebral infarction: Secondary | ICD-10-CM | POA: Diagnosis not present

## 2016-09-23 DIAGNOSIS — I2510 Atherosclerotic heart disease of native coronary artery without angina pectoris: Secondary | ICD-10-CM | POA: Diagnosis not present

## 2016-09-23 DIAGNOSIS — E119 Type 2 diabetes mellitus without complications: Secondary | ICD-10-CM | POA: Diagnosis not present

## 2016-09-23 DIAGNOSIS — I69351 Hemiplegia and hemiparesis following cerebral infarction affecting right dominant side: Secondary | ICD-10-CM | POA: Diagnosis not present

## 2016-09-23 DIAGNOSIS — I110 Hypertensive heart disease with heart failure: Secondary | ICD-10-CM | POA: Diagnosis not present

## 2016-09-24 DIAGNOSIS — I69393 Ataxia following cerebral infarction: Secondary | ICD-10-CM | POA: Diagnosis not present

## 2016-09-24 DIAGNOSIS — E119 Type 2 diabetes mellitus without complications: Secondary | ICD-10-CM | POA: Diagnosis not present

## 2016-09-24 DIAGNOSIS — I110 Hypertensive heart disease with heart failure: Secondary | ICD-10-CM | POA: Diagnosis not present

## 2016-09-24 DIAGNOSIS — I5032 Chronic diastolic (congestive) heart failure: Secondary | ICD-10-CM | POA: Diagnosis not present

## 2016-09-24 DIAGNOSIS — I2510 Atherosclerotic heart disease of native coronary artery without angina pectoris: Secondary | ICD-10-CM | POA: Diagnosis not present

## 2016-09-24 DIAGNOSIS — I69351 Hemiplegia and hemiparesis following cerebral infarction affecting right dominant side: Secondary | ICD-10-CM | POA: Diagnosis not present

## 2016-09-25 DIAGNOSIS — I5032 Chronic diastolic (congestive) heart failure: Secondary | ICD-10-CM | POA: Diagnosis not present

## 2016-09-25 DIAGNOSIS — I110 Hypertensive heart disease with heart failure: Secondary | ICD-10-CM | POA: Diagnosis not present

## 2016-09-25 DIAGNOSIS — I69393 Ataxia following cerebral infarction: Secondary | ICD-10-CM | POA: Diagnosis not present

## 2016-09-25 DIAGNOSIS — I69351 Hemiplegia and hemiparesis following cerebral infarction affecting right dominant side: Secondary | ICD-10-CM | POA: Diagnosis not present

## 2016-09-25 DIAGNOSIS — I2510 Atherosclerotic heart disease of native coronary artery without angina pectoris: Secondary | ICD-10-CM | POA: Diagnosis not present

## 2016-09-25 DIAGNOSIS — E119 Type 2 diabetes mellitus without complications: Secondary | ICD-10-CM | POA: Diagnosis not present

## 2016-09-26 DIAGNOSIS — I69351 Hemiplegia and hemiparesis following cerebral infarction affecting right dominant side: Secondary | ICD-10-CM | POA: Diagnosis not present

## 2016-09-26 DIAGNOSIS — I2510 Atherosclerotic heart disease of native coronary artery without angina pectoris: Secondary | ICD-10-CM | POA: Diagnosis not present

## 2016-09-26 DIAGNOSIS — I5032 Chronic diastolic (congestive) heart failure: Secondary | ICD-10-CM | POA: Diagnosis not present

## 2016-09-26 DIAGNOSIS — I69393 Ataxia following cerebral infarction: Secondary | ICD-10-CM | POA: Diagnosis not present

## 2016-09-26 DIAGNOSIS — I110 Hypertensive heart disease with heart failure: Secondary | ICD-10-CM | POA: Diagnosis not present

## 2016-09-26 DIAGNOSIS — E119 Type 2 diabetes mellitus without complications: Secondary | ICD-10-CM | POA: Diagnosis not present

## 2016-09-30 DIAGNOSIS — I110 Hypertensive heart disease with heart failure: Secondary | ICD-10-CM | POA: Diagnosis not present

## 2016-09-30 DIAGNOSIS — E119 Type 2 diabetes mellitus without complications: Secondary | ICD-10-CM | POA: Diagnosis not present

## 2016-09-30 DIAGNOSIS — I69393 Ataxia following cerebral infarction: Secondary | ICD-10-CM | POA: Diagnosis not present

## 2016-09-30 DIAGNOSIS — I2510 Atherosclerotic heart disease of native coronary artery without angina pectoris: Secondary | ICD-10-CM | POA: Diagnosis not present

## 2016-09-30 DIAGNOSIS — I5032 Chronic diastolic (congestive) heart failure: Secondary | ICD-10-CM | POA: Diagnosis not present

## 2016-09-30 DIAGNOSIS — I69351 Hemiplegia and hemiparesis following cerebral infarction affecting right dominant side: Secondary | ICD-10-CM | POA: Diagnosis not present

## 2016-10-01 DIAGNOSIS — E119 Type 2 diabetes mellitus without complications: Secondary | ICD-10-CM | POA: Diagnosis not present

## 2016-10-01 DIAGNOSIS — I69351 Hemiplegia and hemiparesis following cerebral infarction affecting right dominant side: Secondary | ICD-10-CM | POA: Diagnosis not present

## 2016-10-01 DIAGNOSIS — I110 Hypertensive heart disease with heart failure: Secondary | ICD-10-CM | POA: Diagnosis not present

## 2016-10-01 DIAGNOSIS — I2510 Atherosclerotic heart disease of native coronary artery without angina pectoris: Secondary | ICD-10-CM | POA: Diagnosis not present

## 2016-10-01 DIAGNOSIS — I5032 Chronic diastolic (congestive) heart failure: Secondary | ICD-10-CM | POA: Diagnosis not present

## 2016-10-01 DIAGNOSIS — I69393 Ataxia following cerebral infarction: Secondary | ICD-10-CM | POA: Diagnosis not present

## 2016-10-03 DIAGNOSIS — I2510 Atherosclerotic heart disease of native coronary artery without angina pectoris: Secondary | ICD-10-CM | POA: Diagnosis not present

## 2016-10-03 DIAGNOSIS — I69393 Ataxia following cerebral infarction: Secondary | ICD-10-CM | POA: Diagnosis not present

## 2016-10-03 DIAGNOSIS — E119 Type 2 diabetes mellitus without complications: Secondary | ICD-10-CM | POA: Diagnosis not present

## 2016-10-03 DIAGNOSIS — I5032 Chronic diastolic (congestive) heart failure: Secondary | ICD-10-CM | POA: Diagnosis not present

## 2016-10-03 DIAGNOSIS — I69351 Hemiplegia and hemiparesis following cerebral infarction affecting right dominant side: Secondary | ICD-10-CM | POA: Diagnosis not present

## 2016-10-03 DIAGNOSIS — I110 Hypertensive heart disease with heart failure: Secondary | ICD-10-CM | POA: Diagnosis not present

## 2016-10-08 DIAGNOSIS — I69351 Hemiplegia and hemiparesis following cerebral infarction affecting right dominant side: Secondary | ICD-10-CM | POA: Diagnosis not present

## 2016-10-08 DIAGNOSIS — E119 Type 2 diabetes mellitus without complications: Secondary | ICD-10-CM | POA: Diagnosis not present

## 2016-10-08 DIAGNOSIS — I2510 Atherosclerotic heart disease of native coronary artery without angina pectoris: Secondary | ICD-10-CM | POA: Diagnosis not present

## 2016-10-08 DIAGNOSIS — I5032 Chronic diastolic (congestive) heart failure: Secondary | ICD-10-CM | POA: Diagnosis not present

## 2016-10-08 DIAGNOSIS — I69393 Ataxia following cerebral infarction: Secondary | ICD-10-CM | POA: Diagnosis not present

## 2016-10-08 DIAGNOSIS — I110 Hypertensive heart disease with heart failure: Secondary | ICD-10-CM | POA: Diagnosis not present

## 2016-10-14 DIAGNOSIS — Z0000 Encounter for general adult medical examination without abnormal findings: Secondary | ICD-10-CM | POA: Diagnosis not present

## 2016-10-14 DIAGNOSIS — K219 Gastro-esophageal reflux disease without esophagitis: Secondary | ICD-10-CM | POA: Diagnosis not present

## 2016-10-14 DIAGNOSIS — I5022 Chronic systolic (congestive) heart failure: Secondary | ICD-10-CM | POA: Diagnosis not present

## 2016-10-14 DIAGNOSIS — E1165 Type 2 diabetes mellitus with hyperglycemia: Secondary | ICD-10-CM | POA: Diagnosis not present

## 2016-10-14 DIAGNOSIS — Z1389 Encounter for screening for other disorder: Secondary | ICD-10-CM | POA: Diagnosis not present

## 2016-10-14 DIAGNOSIS — I10 Essential (primary) hypertension: Secondary | ICD-10-CM | POA: Diagnosis not present

## 2016-10-25 DIAGNOSIS — M47816 Spondylosis without myelopathy or radiculopathy, lumbar region: Secondary | ICD-10-CM | POA: Diagnosis not present

## 2016-11-16 DIAGNOSIS — Z9851 Tubal ligation status: Secondary | ICD-10-CM | POA: Diagnosis not present

## 2016-11-16 DIAGNOSIS — Z955 Presence of coronary angioplasty implant and graft: Secondary | ICD-10-CM | POA: Diagnosis not present

## 2016-11-16 DIAGNOSIS — E119 Type 2 diabetes mellitus without complications: Secondary | ICD-10-CM | POA: Diagnosis not present

## 2016-11-16 DIAGNOSIS — I2510 Atherosclerotic heart disease of native coronary artery without angina pectoris: Secondary | ICD-10-CM | POA: Diagnosis not present

## 2016-11-16 DIAGNOSIS — R0602 Shortness of breath: Secondary | ICD-10-CM | POA: Diagnosis not present

## 2016-11-16 DIAGNOSIS — Z952 Presence of prosthetic heart valve: Secondary | ICD-10-CM | POA: Diagnosis not present

## 2016-11-16 DIAGNOSIS — G40909 Epilepsy, unspecified, not intractable, without status epilepticus: Secondary | ICD-10-CM | POA: Diagnosis not present

## 2016-11-16 DIAGNOSIS — Z7982 Long term (current) use of aspirin: Secondary | ICD-10-CM | POA: Diagnosis not present

## 2016-11-16 DIAGNOSIS — Z951 Presence of aortocoronary bypass graft: Secondary | ICD-10-CM | POA: Diagnosis not present

## 2016-11-16 DIAGNOSIS — Z79899 Other long term (current) drug therapy: Secondary | ICD-10-CM | POA: Diagnosis not present

## 2016-11-16 DIAGNOSIS — Z7902 Long term (current) use of antithrombotics/antiplatelets: Secondary | ICD-10-CM | POA: Diagnosis not present

## 2016-11-16 DIAGNOSIS — I249 Acute ischemic heart disease, unspecified: Secondary | ICD-10-CM | POA: Diagnosis not present

## 2016-11-16 DIAGNOSIS — I252 Old myocardial infarction: Secondary | ICD-10-CM | POA: Diagnosis not present

## 2016-11-16 DIAGNOSIS — I214 Non-ST elevation (NSTEMI) myocardial infarction: Secondary | ICD-10-CM

## 2016-11-16 DIAGNOSIS — R079 Chest pain, unspecified: Secondary | ICD-10-CM | POA: Diagnosis not present

## 2016-11-16 DIAGNOSIS — Z9049 Acquired absence of other specified parts of digestive tract: Secondary | ICD-10-CM | POA: Diagnosis not present

## 2016-11-16 DIAGNOSIS — Z794 Long term (current) use of insulin: Secondary | ICD-10-CM | POA: Diagnosis not present

## 2016-11-16 DIAGNOSIS — Z90710 Acquired absence of both cervix and uterus: Secondary | ICD-10-CM | POA: Diagnosis not present

## 2016-11-16 DIAGNOSIS — I10 Essential (primary) hypertension: Secondary | ICD-10-CM | POA: Diagnosis not present

## 2016-11-17 ENCOUNTER — Inpatient Hospital Stay (HOSPITAL_COMMUNITY)
Admission: AD | Admit: 2016-11-17 | Discharge: 2016-11-19 | DRG: 281 | Disposition: A | Discharge status: Home / Self Care | Payer: Medicare Other | Source: Other Acute Inpatient Hospital | Attending: Cardiology | Admitting: Cardiology

## 2016-11-17 ENCOUNTER — Inpatient Hospital Stay (HOSPITAL_COMMUNITY): Payer: Medicare Other

## 2016-11-17 ENCOUNTER — Encounter (HOSPITAL_COMMUNITY): Payer: Self-pay | Admitting: *Deleted

## 2016-11-17 DIAGNOSIS — Z90710 Acquired absence of both cervix and uterus: Secondary | ICD-10-CM

## 2016-11-17 DIAGNOSIS — Z79899 Other long term (current) drug therapy: Secondary | ICD-10-CM | POA: Diagnosis not present

## 2016-11-17 DIAGNOSIS — Z8673 Personal history of transient ischemic attack (TIA), and cerebral infarction without residual deficits: Secondary | ICD-10-CM

## 2016-11-17 DIAGNOSIS — E1122 Type 2 diabetes mellitus with diabetic chronic kidney disease: Secondary | ICD-10-CM | POA: Diagnosis present

## 2016-11-17 DIAGNOSIS — F0390 Unspecified dementia without behavioral disturbance: Secondary | ICD-10-CM | POA: Diagnosis present

## 2016-11-17 DIAGNOSIS — M353 Polymyalgia rheumatica: Secondary | ICD-10-CM | POA: Diagnosis present

## 2016-11-17 DIAGNOSIS — K219 Gastro-esophageal reflux disease without esophagitis: Secondary | ICD-10-CM | POA: Diagnosis present

## 2016-11-17 DIAGNOSIS — R079 Chest pain, unspecified: Secondary | ICD-10-CM

## 2016-11-17 DIAGNOSIS — I130 Hypertensive heart and chronic kidney disease with heart failure and stage 1 through stage 4 chronic kidney disease, or unspecified chronic kidney disease: Secondary | ICD-10-CM | POA: Diagnosis present

## 2016-11-17 DIAGNOSIS — N183 Chronic kidney disease, stage 3 (moderate): Secondary | ICD-10-CM | POA: Diagnosis present

## 2016-11-17 DIAGNOSIS — I10 Essential (primary) hypertension: Secondary | ICD-10-CM | POA: Diagnosis not present

## 2016-11-17 DIAGNOSIS — I5032 Chronic diastolic (congestive) heart failure: Secondary | ICD-10-CM | POA: Diagnosis present

## 2016-11-17 DIAGNOSIS — I2510 Atherosclerotic heart disease of native coronary artery without angina pectoris: Secondary | ICD-10-CM | POA: Diagnosis present

## 2016-11-17 DIAGNOSIS — E785 Hyperlipidemia, unspecified: Secondary | ICD-10-CM | POA: Diagnosis present

## 2016-11-17 DIAGNOSIS — Z951 Presence of aortocoronary bypass graft: Secondary | ICD-10-CM

## 2016-11-17 DIAGNOSIS — Z794 Long term (current) use of insulin: Secondary | ICD-10-CM | POA: Diagnosis not present

## 2016-11-17 DIAGNOSIS — Z7982 Long term (current) use of aspirin: Secondary | ICD-10-CM | POA: Diagnosis not present

## 2016-11-17 DIAGNOSIS — Z7952 Long term (current) use of systemic steroids: Secondary | ICD-10-CM | POA: Diagnosis not present

## 2016-11-17 DIAGNOSIS — I214 Non-ST elevation (NSTEMI) myocardial infarction: Secondary | ICD-10-CM | POA: Diagnosis not present

## 2016-11-17 DIAGNOSIS — Z952 Presence of prosthetic heart valve: Secondary | ICD-10-CM | POA: Diagnosis not present

## 2016-11-17 DIAGNOSIS — N189 Chronic kidney disease, unspecified: Secondary | ICD-10-CM | POA: Diagnosis not present

## 2016-11-17 LAB — TROPONIN I
TROPONIN I: 0.39 ng/mL — AB (ref <0.03)
TROPONIN I: 0.17 ng/mL — AB (ref <0.03)
Troponin I: 0.29 ng/mL (ref <0.03)

## 2016-11-17 LAB — BASIC METABOLIC PANEL
ANION GAP: 11 (ref 5–15)
BUN: 25 mg/dL — ABNORMAL HIGH (ref 6–20)
CALCIUM: 8.8 mg/dL — AB (ref 8.9–10.3)
CO2: 26 mmol/L (ref 22–32)
Chloride: 100 mmol/L — ABNORMAL LOW (ref 101–111)
Creatinine, Ser: 1.36 mg/dL — ABNORMAL HIGH (ref 0.44–1.00)
GFR, EST AFRICAN AMERICAN: 41 mL/min — AB (ref >60)
GFR, EST NON AFRICAN AMERICAN: 35 mL/min — AB (ref >60)
Glucose, Bld: 251 mg/dL — ABNORMAL HIGH (ref 65–99)
Potassium: 3.5 mmol/L (ref 3.5–5.1)
SODIUM: 137 mmol/L (ref 135–145)

## 2016-11-17 LAB — HEPATIC FUNCTION PANEL
ALT: 21 U/L (ref 14–54)
AST: 30 U/L (ref 15–41)
Albumin: 2.9 g/dL — ABNORMAL LOW (ref 3.5–5.0)
Alkaline Phosphatase: 35 U/L — ABNORMAL LOW (ref 38–126)
Total Bilirubin: 0.6 mg/dL (ref 0.3–1.2)
Total Protein: 5.5 g/dL — ABNORMAL LOW (ref 6.5–8.1)

## 2016-11-17 LAB — LIPID PANEL
CHOL/HDL RATIO: 1.7 ratio
Cholesterol: 138 mg/dL (ref 0–200)
HDL: 79 mg/dL (ref >40)
LDL Cholesterol: 46 mg/dL (ref 0–99)
Triglycerides: 63 mg/dL (ref <150)
VLDL: 13 mg/dL (ref 0–40)

## 2016-11-17 LAB — CBC
HCT: 34.2 % — ABNORMAL LOW (ref 36.0–46.0)
HEMATOCRIT: 34.6 % — AB (ref 36.0–46.0)
Hemoglobin: 11.1 g/dL — ABNORMAL LOW (ref 12.0–15.0)
Hemoglobin: 11.2 g/dL — ABNORMAL LOW (ref 12.0–15.0)
MCH: 29.3 pg (ref 26.0–34.0)
MCH: 28.9 pg (ref 26.0–34.0)
MCHC: 32.5 g/dL (ref 30.0–36.0)
MCHC: 32.4 g/dL (ref 30.0–36.0)
MCV: 90.2 fL (ref 78.0–100.0)
MCV: 89.4 fL (ref 78.0–100.0)
PLATELETS: 108 10*3/uL — AB (ref 150–400)
PLATELETS: 122 10*3/uL — AB (ref 150–400)
RBC: 3.79 MIL/uL — AB (ref 3.87–5.11)
RBC: 3.87 MIL/uL (ref 3.87–5.11)
RDW: 16.9 % — AB (ref 11.5–15.5)
RDW: 16.9 % — ABNORMAL HIGH (ref 11.5–15.5)
WBC: 6.2 10*3/uL (ref 4.0–10.5)
WBC: 6.7 10*3/uL (ref 4.0–10.5)

## 2016-11-17 LAB — GLUCOSE, CAPILLARY
GLUCOSE-CAPILLARY: 174 mg/dL — AB (ref 65–99)
Glucose-Capillary: 169 mg/dL — ABNORMAL HIGH (ref 65–99)
Glucose-Capillary: 66 mg/dL (ref 65–99)
Glucose-Capillary: 210 mg/dL — ABNORMAL HIGH (ref 65–99)
Glucose-Capillary: 169 mg/dL — ABNORMAL HIGH (ref 65–99)

## 2016-11-17 LAB — HEPARIN LEVEL (UNFRACTIONATED)
Heparin Unfractionated: >2.2 IU/mL — ABNORMAL HIGH (ref 0.30–0.70)
Heparin Unfractionated: 1.18 IU/mL — ABNORMAL HIGH (ref 0.30–0.70)
Heparin Unfractionated: 0.89 IU/mL — ABNORMAL HIGH (ref 0.30–0.70)

## 2016-11-17 LAB — PROTIME-INR
INR: 1.08
PROTHROMBIN TIME: 13.9 s (ref 11.4–15.2)

## 2016-11-17 LAB — ECHOCARDIOGRAM COMPLETE
HEIGHTINCHES: 62 in
Weight: 2259.2 oz

## 2016-11-17 MED ORDER — PANTOPRAZOLE SODIUM 40 MG PO TBEC
40.0000 mg | DELAYED_RELEASE_TABLET | Freq: Every day | ORAL | Status: DC
  Administered 2016-11-17 – 2016-11-19 (×2): 40 mg via ORAL
  Filled 2016-11-17 (×2): qty 1

## 2016-11-17 MED ORDER — HEPARIN (PORCINE) IN NACL 100-0.45 UNIT/ML-% IJ SOLN
650.0000 [IU]/h | INTRAMUSCULAR | Status: DC
  Administered 2016-11-17: 1000 [IU]/h via INTRAVENOUS
  Administered 2016-11-17: 850 [IU]/h via INTRAVENOUS
  Filled 2016-11-17: qty 250

## 2016-11-17 MED ORDER — MELATONIN 3 MG PO TABS
6.0000 mg | ORAL_TABLET | Freq: Every evening | ORAL | Status: DC | PRN
  Administered 2016-11-18: 6 mg via ORAL
  Filled 2016-11-17 (×2): qty 2

## 2016-11-17 MED ORDER — INSULIN GLARGINE 100 UNIT/ML ~~LOC~~ SOLN
16.0000 [IU] | Freq: Every day | SUBCUTANEOUS | Status: DC
  Administered 2016-11-17 – 2016-11-19 (×2): 16 [IU] via SUBCUTANEOUS
  Filled 2016-11-17 (×3): qty 0.16

## 2016-11-17 MED ORDER — SODIUM CHLORIDE 0.9% FLUSH: 3.0000 mL | INTRAVENOUS | Status: DC | PRN

## 2016-11-17 MED ORDER — HYDROCODONE-ACETAMINOPHEN 5-325 MG PO TABS
1.0000 | ORAL_TABLET | Freq: Two times a day (BID) | ORAL | Status: DC | PRN
  Administered 2016-11-17 – 2016-11-19 (×4): 1 via ORAL
  Filled 2016-11-17 (×4): qty 1

## 2016-11-17 MED ORDER — INSULIN ASPART 100 UNIT/ML ~~LOC~~ SOLN
7.0000 [IU] | Freq: Three times a day (TID) | SUBCUTANEOUS | Status: DC
  Administered 2016-11-17 – 2016-11-19 (×3): 7 [IU] via SUBCUTANEOUS

## 2016-11-17 MED ORDER — NITROGLYCERIN 0.4 MG SL SUBL: 0.4000 mg | SUBLINGUAL_TABLET | SUBLINGUAL | Status: DC | PRN

## 2016-11-17 MED ORDER — SODIUM CHLORIDE 0.9 % IV SOLN: 250.0000 mL | INTRAVENOUS | Status: DC | PRN

## 2016-11-17 MED ORDER — INSULIN ASPART 100 UNIT/ML ~~LOC~~ SOLN
0.0000 [IU] | Freq: Every day | SUBCUTANEOUS | Status: DC
  Administered 2016-11-18: 2 [IU] via SUBCUTANEOUS

## 2016-11-17 MED ORDER — LISINOPRIL 10 MG PO TABS
10.0000 mg | ORAL_TABLET | Freq: Every day | ORAL | Status: DC
Start: 2016-11-17 — End: 2016-11-19
  Administered 2016-11-17 – 2016-11-19 (×3): 10 mg via ORAL
  Filled 2016-11-17 (×4): qty 1

## 2016-11-17 MED ORDER — SODIUM CHLORIDE 0.9% FLUSH: 3.0000 mL | Freq: Two times a day (BID) | INTRAVENOUS | Status: DC

## 2016-11-17 MED ORDER — FUROSEMIDE 40 MG PO TABS
40.0000 mg | ORAL_TABLET | Freq: Every day | ORAL | Status: DC
  Administered 2016-11-17 – 2016-11-19 (×2): 40 mg via ORAL
  Filled 2016-11-17 (×2): qty 1

## 2016-11-17 MED ORDER — ISOSORBIDE MONONITRATE ER 60 MG PO TB24
60.0000 mg | ORAL_TABLET | Freq: Every day | ORAL | Status: DC
  Administered 2016-11-17 – 2016-11-19 (×2): 60 mg via ORAL
  Filled 2016-11-17 (×2): qty 1

## 2016-11-17 MED ORDER — ASPIRIN EC 81 MG PO TBEC: 81.0000 mg | DELAYED_RELEASE_TABLET | Freq: Every day | ORAL | Status: DC

## 2016-11-17 MED ORDER — LATANOPROST 0.005 % OP SOLN
1.0000 [drp] | Freq: Every day | OPHTHALMIC | Status: DC
  Administered 2016-11-17 – 2016-11-18 (×2): 1 [drp] via OPHTHALMIC
  Filled 2016-11-17: qty 2.5

## 2016-11-17 MED ORDER — CLOPIDOGREL BISULFATE 75 MG PO TABS
75.0000 mg | ORAL_TABLET | Freq: Every day | ORAL | Status: DC
  Administered 2016-11-17 – 2016-11-19 (×3): 75 mg via ORAL
  Filled 2016-11-17 (×3): qty 1

## 2016-11-17 MED ORDER — AMLODIPINE BESYLATE 10 MG PO TABS
10.0000 mg | ORAL_TABLET | Freq: Every day | ORAL | Status: DC
  Administered 2016-11-17 – 2016-11-19 (×3): 10 mg via ORAL
  Filled 2016-11-17 (×3): qty 1

## 2016-11-17 MED ORDER — TIMOLOL MALEATE 0.5 % OP SOLN
1.0000 [drp] | Freq: Every day | OPHTHALMIC | Status: DC
  Administered 2016-11-17 – 2016-11-19 (×2): 1 [drp] via OPHTHALMIC
  Filled 2016-11-17: qty 5

## 2016-11-17 MED ORDER — NON FORMULARY: 6.0000 mg | Freq: Every evening | Status: DC | PRN

## 2016-11-17 MED ORDER — INSULIN ASPART 100 UNIT/ML ~~LOC~~ SOLN
0.0000 [IU] | Freq: Three times a day (TID) | SUBCUTANEOUS | Status: DC
  Administered 2016-11-18: 3 [IU] via SUBCUTANEOUS
  Administered 2016-11-19: 2 [IU] via SUBCUTANEOUS
  Administered 2016-11-19: 5 [IU] via SUBCUTANEOUS

## 2016-11-17 MED ORDER — SODIUM CHLORIDE 0.9 % WEIGHT BASED INFUSION
3.0000 mL/kg/h | INTRAVENOUS | Status: DC
  Administered 2016-11-18: 3 mL/kg/h via INTRAVENOUS

## 2016-11-17 MED ORDER — DIVALPROEX SODIUM ER 500 MG PO TB24
500.0000 mg | ORAL_TABLET | Freq: Every day | ORAL | Status: DC
  Administered 2016-11-17 – 2016-11-19 (×2): 500 mg via ORAL
  Filled 2016-11-17 (×2): qty 1

## 2016-11-17 MED ORDER — SODIUM CHLORIDE 0.9 % WEIGHT BASED INFUSION: 1.0000 mL/kg/h | INTRAVENOUS | Status: DC

## 2016-11-17 MED ORDER — ACETAMINOPHEN 325 MG PO TABS
650.0000 mg | ORAL_TABLET | ORAL | Status: DC | PRN
  Administered 2016-11-17 – 2016-11-19 (×3): 650 mg via ORAL
  Filled 2016-11-17 (×3): qty 2

## 2016-11-17 MED ORDER — ONDANSETRON HCL 4 MG/2ML IJ SOLN: 4.0000 mg | Freq: Four times a day (QID) | INTRAMUSCULAR | Status: DC | PRN

## 2016-11-17 MED ORDER — ASPIRIN EC 81 MG PO TBEC
81.0000 mg | DELAYED_RELEASE_TABLET | Freq: Every day | ORAL | Status: DC
  Administered 2016-11-17 – 2016-11-19 (×3): 81 mg via ORAL
  Filled 2016-11-17 (×3): qty 1

## 2016-11-17 MED ORDER — PREDNISONE 5 MG PO TABS
5.0000 mg | ORAL_TABLET | Freq: Every day | ORAL | Status: DC
  Administered 2016-11-17 – 2016-11-19 (×3): 5 mg via ORAL
  Filled 2016-11-17 (×3): qty 1

## 2016-11-17 MED ORDER — ROSUVASTATIN CALCIUM 20 MG PO TABS
20.0000 mg | ORAL_TABLET | Freq: Every day | ORAL | Status: DC
  Administered 2016-11-17 – 2016-11-19 (×2): 20 mg via ORAL
  Filled 2016-11-17 (×2): qty 1

## 2016-11-17 NOTE — Progress Notes (Signed)
  Echocardiogram 2D Echocardiogram has been performed.  Sharon Hubbard 11/17/2016, 1:04 PM

## 2016-11-17 NOTE — Progress Notes (Signed)
Patient without complaint during7 a to 7 p shift, VSS.  Patient denies any chest pain or shortness of breath.  Patient and family aware patient is nothing by mouth after midnight.  Son signed consent for cardiac cath.  Consent on chart.

## 2016-11-17 NOTE — Progress Notes (Signed)
ANTICOAGULATION CONSULT NOTE - Initial Consult  Pharmacy Consult for Heparin Indication: chest pain/ACS  Allergies  Allergen Reactions  . Clarithromycin     REACTION: Vomiting and diarrhea.  . Codeine Nausea And Vomiting  . Fentanyl Other (See Comments)  . Morphine Nausea And Vomiting  . Statins Other (See Comments)  . Sulfa Antibiotics   . Atorvastatin Other (See Comments)    Made her knees buckle and muscles ache    Patient Measurements: Height: 5\' 2"  (157.5 cm) Weight: 141 lb 3.2 oz (64 kg) (b scale) IBW/kg (Calculated) : 50.1  Vital Signs: Temp: 98.1 F (36.7 C) (10/28 0136) Temp Source: Oral (10/28 0136) BP: 172/71 (10/28 0136) Pulse Rate: 63 (10/28 0136)  Labs (at [facility]): WBC  6.0 Hgb  11.6 Hct  35.7 Plt  116  SCr  1.3 No results for input(s): HGB, HCT, PLT, APTT, LABPROT, INR, HEPARINUNFRC, HEPRLOWMOCWT, CREATININE, CKTOTAL, CKMB, TROPONINI in the last 72 hours.  CrCl cannot be calculated (Patient's most recent lab result is older than the maximum 21 days allowed.).   Medical History: Past Medical History:  Diagnosis Date  . Anemia   . Aortic stenosis    very mild, echo 12/2010  . Blood transfusion   . Bradycardia, sinus   . CAD (coronary artery disease)    Interventions in the past  . Cancer ([place])    ""had hysterectomy for a 6 showing cancer; think that's pretty strong  . Complication of anesthesia    "felt like I was smothering once with mask on my face"  . Diabetes mellitus   . Dyslipidemia   . Ejection fraction    EF 65%, echo, April, 2012, aortic valve sclerosis with very slight gradient  . Femoral bruit    01/2011 hosp, but no pseudoaneurysm or AV fistula  . GERD (gastroesophageal reflux disease)   . Groin hematoma    April, 2011  . Hypertension   . Myocardial infarction 2011; 08/2010  . Osteoarthritis    arthroscopic surgery right knee February, 2012  . Pancreas cyst    The patient has multiple pancreatic cysts.  This is followed at  [facility]         . Polymyalgia rheumatica ([place])   . Preoperative evaluation to rule out surgical contraindication    Patient needs back surgery by Dr. Carloyn Manner, May, 2012                . RBBB (right bundle branch block)    old  . Renal insufficiency   . Schatzki's ring   . Shortness of breath on exertion   . Spinal stenosis    history of surgery for this  . Stroke [facility]) 01/18/11   "I've had 2 TIA's"    Medications:  Prescriptions Prior to Admission  Medication Sig Dispense Refill Last Dose  . amLODipine (NORVASC) 10 MG tablet Take 10 mg by mouth daily.     Taking  . aspirin 81 MG tablet Take 81 mg by mouth daily.     Taking  . Calcium Carbonate-Vitamin D (CALCIUM-VITAMIN D) 500-200 MG-UNIT tablet Take 1 tablet by mouth daily.   Taking  . Cholecalciferol (VITAMIN D-3) 1000 units CAPS Take 1 capsule by mouth daily.   Taking  . divalproex (DEPAKOTE) 500 MG 24 hr tablet Take 500 mg by mouth daily.     Taking  . furosemide (LASIX) 40 MG tablet Take 1 tablet (40 mg total) by mouth daily. 90 tablet 3   . Insulin Glargine (LANTUS SOLOSTAR)  100 UNIT/ML Solostar Pen Inject 16 Units into the skin every morning.    Taking  . insulin lispro (HUMALOG KWIKPEN) 100 UNIT/ML KiwkPen Inject 7 Units into the skin 3 (three) times daily before meals.    Taking  . isosorbide mononitrate (IMDUR) 60 MG 24 hr tablet Take 60 mg by mouth daily.    Taking  . latanoprost (XALATAN) 0.005 % ophthalmic solution Place 1 drop into both eyes at bedtime.     Taking  . lisinopril (PRINIVIL,ZESTRIL) 10 MG tablet Take 10 mg by mouth daily.   Taking  . mirabegron ER (MYRBETRIQ) 25 MG TB24 tablet Take 25 mg by mouth 2 (two) times daily.    Taking  . pantoprazole (PROTONIX) 40 MG tablet Take 40 mg by mouth daily.   Taking  . predniSONE (DELTASONE) 5 MG tablet Take 5 mg by mouth daily. as directed  0 Taking  . rosuvastatin (CRESTOR) 20 MG tablet Take 20 mg by mouth daily.   Taking  . timolol (TIMOPTIC) 0.5 % ophthalmic solution  Place 1 drop into both eyes daily.    Taking  . [DISCONTINUED] HYDROcodone-acetaminophen (NORCO) 10-325 MG tablet Take 1 tablet by mouth every 6 (six) hours as needed.  0 Taking  . [DISCONTINUED] potassium chloride SA (K-DUR,KLOR-CON) 20 MEQ tablet Take 20 mEq by mouth daily.   Taking    Assessment: 83 y.o. female with chest pain for heparin.  Heparin 4000 units IV bolus, 1150 units/hr started at [facility] at 2040  Goal of Therapy:  Heparin level 0.3-0.7 units/ml Monitor platelets by anticoagulation protocol: Yes   Plan:  Decrease Heparin 1000 units/hr Check heparin level in 6 hours.   Amilio Zehnder, Bronson Curb 11/17/2016,3:15 AM

## 2016-11-17 NOTE — Progress Notes (Addendum)
Sharon Hubbard is a 83 y.o. female with a history of CKD, diabetes, hypertension, hyperlipidemia, and CVA, as well as dementia, TIAs.  She has a history of coronary disease status post CABG x1 and is status post AVR in 2016.  She had a RIMA to the RCA.  She presented to the hospital with chest pain, found to have an end STEMI.  She is currently on aspirin and Plavix as well as a heparin drip.  Not having active chest pain.  On exam regular rhythm, no murmurs, lungs clear.  Due to her end STEMI, [name] plan for left heart catheterization.  Risks and benefits were discussed with both patient and her family.  Risks include bleeding, tamponade, MI, stroke, and kidney disease as well as death.  Per the patient's son, she has had significant bleeding issues with catheterizations in the past.  They understand the risks and agreed to the procedure.  [name], MD 11/17/2016 11:31 AM

## 2016-11-17 NOTE — H&P (Addendum)
History and Physical  Primary Cardiologist:Branch PCP: Neale Burly, MD  Chief Complaint: chest pain, mild NSTEMI  HPI:  Sharon Hubbard is a 83 y.o. female with history of CKD, DM, HTN, HLD, CVA 2 months ago, dementia, steroid dependent polymyalgia rheumatica, TIAs, CAD (CABG 1 and aVR 21 mm Edwards Magna ease pericardial tissue valve 08/2014 with the RIMA to the RCA). Also previous history of DES to the mid LAD in 2012 and 2015.   She presented this evening to the ED [facility]) with acute onset chest pain, which improved by presentation to the ED.  Initial troponin elevated (0.4) and she is called for transfer due to NSTEMI.  S/P ASA 325, clopidogrel 75 mg qd (chronic home medication), and ACS IV heparin infusion.    In 2017 echo showed normal biventricular function and normal prosthetic aortic valve gradients.  Notably, see below cath reports, prior to [place] to RCA, there were attempts at PCI to RCA which were unsuccessful due to inability to cross the stenosis.   On interview, she is cheerful, hard of hearing, denyig significant symptoms at this time.  Her son helps with aiding in the story.  She has been recovering from her CVA 2 months prior.  The chest pain tonight is very unusual for her.  No edema, palpitations, breathing trouble or other concerns.    Labs at OSH: Na 139, K 4.2, Cl 100, CO2 25, BUN 31, Crt 1.3; Trop 0.04, nl LFTS and CBC  _________________________ Echo 11/2015 Study Conclusions   - Left ventricle: The cavity size was normal. Wall thickness was   increased in a pattern of mild LVH. Systolic function was normal.   The estimated ejection fraction was in the range of 55% to 60%.   Wall motion was normal; there were no regional wall motion   abnormalities. Doppler parameters are consistent with abnormal   left ventricular relaxation (grade 1 diastolic dysfunction).   Doppler parameters are consistent with high ventricular filling   pressure. -  Aortic valve: A bioprosthesis was present. Valve area (VTI): 1.5   cm^2. Valve area (Vmax): 1.29 cm^2. Valve area (Vmean): 1.5 cm^2. - Mitral valve: Calcified annulus. Mildly thickened leaflets .   There was mild regurgitation.   Impressions:   - Normal LV systolic function; mild LVH; grade 1 diastolic   dysfunction with elevated LV filling pressure; prosthetic aortic   valve with normal mean gradient of 14 mmHg; mild MR.  Cath 08/16/14 1. Prox RCA to Mid RCA lesion, 99% stenosed. Unable to cross with wire. Very difficult guide support. Attempted multiple different catheters, unable to use more than 5 [place] guide from radial access. 2. Widely patent stents in the LAD and circumflex 3. Likely mild aortic stenosis   Severe multivessel disease now with progression of RCA disease noted in 2015 from roughly 40% to 99% subtotal occlusion that is heavily calcified and unable to cross with wires from radial access.   Recommendations:  Patient was transferred for to [facility] post procedure unit for TR band removal.  Restart IV heparin 6-8 hours post TR band removal.  Planned staged PCI via rotational atherectomy tomorrow   Cath 08/17/14  Mid RCA lesion, 99% stenosed.  Attempted PCI of the severe mid RCA stenosis   1. Unstable angina with severe calcified stenosis (functional CTO) of the mid RCA 2. Attempted PCI of the mid RCA, aborted due to inability to cross the stenosis.    Recommendations: I do not think that the  stenosis can be approached with traditional PCI techniques. Her options at this point would be continued medical management alone vs consultation with the CTO team to discuss options for CTO PCI vs consideration of bypass. I will ask Dr. Irish Lack to see her tomorrow to discuss CTO PCI. I do not think she is a good candidate for CABG given her chronic steroid use but this could be considered.   Prior Cardiac Studies:  Past Medical History:  Diagnosis Date  . Anemia   . Aortic  stenosis    very mild, echo 12/2010  . Blood transfusion   . Bradycardia, sinus   . CAD (coronary artery disease)    Interventions in the past  . Cancer ([place])    ""had hysterectomy for a 6 showing cancer; think that's pretty strong  . Complication of anesthesia    "felt like I was smothering once with mask on my face"  . Diabetes mellitus   . Dyslipidemia   . Ejection fraction    EF 65%, echo, April, 2012, aortic valve sclerosis with very slight gradient  . Femoral bruit    01/2011 hosp, but no pseudoaneurysm or AV fistula  . GERD (gastroesophageal reflux disease)   . Groin hematoma    April, 2011  . Hypertension   . Myocardial infarction 2011; 08/2010  . Osteoarthritis    arthroscopic surgery right knee February, 2012  . Pancreas cyst    The patient has multiple pancreatic cysts.  This is [facility] at [facility]         . Polymyalgia rheumatica ([place])   . Preoperative evaluation to rule out surgical contraindication    Patient needs back surgery by Dr. Carloyn Manner, May, 2012                . RBBB (right bundle branch block)    old  . Renal insufficiency   . Schatzki's ring   . Shortness of breath on exertion   . Spinal stenosis    history of surgery for this  . Stroke ([place]) 01/18/11   "I've had 2 TIA's"    Past Surgical History:  Procedure Laterality Date  . ABDOMINAL HYSTERECTOMY  ~ 1974  . AORTIC VALVE REPLACEMENT N/A 08/22/2014   Procedure: AORTIC VALVE REPLACEMENT (AVR);  Surgeon: Melrose Nakayama, MD;  Location: [place];  Service: Open Heart Surgery;  Laterality: N/A;  . APPENDECTOMY    . BACK SURGERY    . CARDIAC CATHETERIZATION  01/18/11  . CARDIAC CATHETERIZATION N/A 08/16/2014   Procedure: Left Heart Cath and Coronary Angiography;  Surgeon: Leonie Man, MD;  Location: [place] CV LAB;  Service: Cardiovascular;  Laterality: N/A;  . CARDIAC CATHETERIZATION N/A 08/16/2014   Procedure: Coronary Stent Intervention;  Surgeon: Leonie Man, MD;  Location: [place] CV  LAB;  Service: Cardiovascular;  Laterality: N/A;  . CARDIAC CATHETERIZATION N/A 08/17/2014   Procedure: Coronary/Graft Atherectomy;  Surgeon: Burnell Blanks, MD;  Location: [place] CV LAB;  Service: Cardiovascular;  Laterality: N/A;  . CHOLECYSTECTOMY  1987  . CORONARY ANGIOGRAM  01/18/2011   Procedure: CORONARY ANGIOGRAM;  Surgeon: Larey Dresser, MD;  Location: [facility] CATH LAB;  Service: Cardiovascular;;  . CORONARY ANGIOPLASTY WITH STENT PLACEMENT  2011   "in [place]"  . CORONARY ANGIOPLASTY WITH STENT PLACEMENT  08/2010; 03/25/2013  . CORONARY ARTERY BYPASS GRAFT N/A 08/22/2014   Procedure: CORONARY ARTERY BYPASS GRAFTING (CABG), ON PUMP, TIMES ONE, USING RIGHT INTERNAL MAMMARY ARTERY;  Surgeon: Melrose Nakayama, MD;  Location: [facility] OR;  Service: Open Heart Surgery;  Laterality: N/A;  . DILATION AND CURETTAGE OF UTERUS    . FEMORAL ARTERY EXPLORATION Right 03/26/2013   Procedure: FEMORAL ARTERY EXPLORATION WITH SUTURE REPAIR; EVACUATION OF HEMATOMA;  Surgeon: Mal Misty, MD;  Location: [place];  Service: Vascular;  Laterality: Right;  . KNEE ARTHROSCOPY  02/2010   right knee; chrondoplasty medial femoral condyle;  Partial medial meniscectomy.   Marland Kitchen LEFT AND RIGHT HEART CATHETERIZATION WITH CORONARY ANGIOGRAM N/A 03/25/2013   Procedure: LEFT AND RIGHT HEART CATHETERIZATION WITH CORONARY ANGIOGRAM;  Surgeon: Burnell Blanks, MD;  Location: [facility] C[facility] LAB;  Service: Cardiovascular;  Laterality: N/A;  . LUMBAR SPINE SURGERY  ~ 1977; ~ 2010   "2 hugh ruptured discs; I was paralyzed first OR; 2nd OR by Dr. Carloyn Manner, don't know what for"  . TEE WITHOUT CARDIOVERSION N/A 08/22/2014   Procedure: TRANSESOPHAGEAL ECHOCARDIOGRAM (TEE);  Surgeon: Melrose Nakayama, MD;  Location: [place];  Service: Open Heart Surgery;  Laterality: N/A;  . TUBAL LIGATION  1970's    Family History  Problem Relation Age of Onset  . Uterine cancer Mother 55  . Other Father 79       cervical fracture   Social  History:  reports that she has never smoked. She has never used smokeless tobacco. She reports that she does not drink alcohol or use drugs.  Allergies:  Allergies  Allergen Reactions  . Clarithromycin     REACTION: Vomiting and diarrhea.  . Codeine Nausea And Vomiting  . Fentanyl Other (See Comments)  . Morphine Nausea And Vomiting  . Statins Other (See Comments)  . Sulfa Antibiotics   . Atorvastatin Other (See Comments)    Made her knees buckle and muscles ache    No current facility-administered medications on file prior to encounter.    Current Outpatient Prescriptions on File Prior to Encounter  Medication Sig Dispense Refill  . amLODipine (NORVASC) 10 MG tablet Take 10 mg by mouth daily.      Marland Kitchen aspirin 81 MG tablet Take 81 mg by mouth daily.      . Calcium Carbonate-Vitamin D (CALCIUM-VITAMIN D) 500-200 MG-UNIT tablet Take 1 tablet by mouth daily.    . Cholecalciferol (VITAMIN D-3) 1000 units CAPS Take 1 capsule by mouth daily.    . divalproex (DEPAKOTE) 500 MG 24 hr tablet Take 500 mg by mouth daily.      . furosemide (LASIX) 40 MG tablet Take 1 tablet (40 mg total) by mouth daily. 90 tablet 3  . Insulin Glargine (LANTUS SOLOSTAR) 100 UNIT/ML Solostar Pen Inject 16 Units into the skin every morning.     . insulin lispro (HUMALOG KWIKPEN) 100 UNIT/ML KiwkPen Inject 7 Units into the skin 3 (three) times daily before meals.     . isosorbide mononitrate (IMDUR) 60 MG 24 hr tablet Take 60 mg by mouth daily.     Marland Kitchen latanoprost (XALATAN) 0.005 % ophthalmic solution Place 1 drop into both eyes at bedtime.      Marland Kitchen lisinopril (PRINIVIL,ZESTRIL) 10 MG tablet Take 10 mg by mouth daily.    . mirabegron ER (MYRBETRIQ) 25 MG TB24 tablet Take 25 mg by mouth 2 (two) times daily.     . pantoprazole (PROTONIX) 40 MG tablet Take 40 mg by mouth daily.    . predniSONE (DELTASONE) 5 MG tablet Take 5 mg by mouth daily. as directed  0  . rosuvastatin (CRESTOR) 20 MG tablet Take 20 mg by mouth  daily.      . timolol (TIMOPTIC) 0.5 % ophthalmic solution Place 1 drop into both eyes daily.        Results for orders placed or performed during the hospital encounter of 11/17/16 (from the past 48 hour(s))  Glucose, capillary     Status: Abnormal   Collection Time: 11/17/16  1:20 AM  Result Value Ref Range   Glucose-Capillary 169 (H) 65 - 99 mg/dL   Comment 1 Notify RN    Comment 2 Document in Chart    No results found.  ECG/Tele: NSR RBBB non-specific ST changes  ROS: As above. Otherwise, review of systems is negative unless per above HPI  Vitals:   11/17/16 0136 11/17/16 0145  BP: (!) 172/71   Pulse: 63   Resp: 20   Temp: 98.1 F (36.7 C)   TempSrc: Oral   SpO2: 99% 99%  Weight: 64 kg (141 lb 3.2 oz)   Height: 5\' 2"  (1.575 m)    Wt Readings from Last 10 Encounters:  11/17/16 64 kg (141 lb 3.2 oz)  12/20/15 56.7 kg (125 lb)  11/26/15 55.7 kg (122 lb 12.7 oz)  11/24/15 56.7 kg (125 lb)  07/07/15 57.6 kg (127 lb)  01/10/15 58.3 kg (128 lb 9.6 oz)  12/09/14 58.5 kg (129 lb)  10/10/14 59.4 kg (131 lb)  09/27/14 58.1 kg (128 lb)  08/29/14 58.5 kg (128 lb 15.5 oz)    PE:  General: No acute distress HEENT: Atraumatic, EOMI, mucous membranes moist. No JVD at  degrees. No HJR. CV: RRR 3/6 SEM, no gallops.  Respiratory: Clear, no crackles. Normal work of breathing ABD: Non-distended and non-tender. No palpable organomegaly.  Extremities: 2+ radial pulses bilaterally. 0 edema., chronic venous stasis Neuro/Psych: CN grossly intact, alert and oriented  Assessment/Plan 1. NSTEMI 2. Chronic diastolic CHF (congestive heart failure) ([place])  3. Coronary artery disease CABG 1 and aVR pericardial tissue valve 08/2014 with the RIMA to the RCA. Previous history of DES to the mid LAD in 2012 and 2015. 5. CKD (chronic kidney disease) stage 3, GFR 30-59 ml/min  6. Prosthetic AVR (tissue valve) -- 21 mm Edwards Magna ease pericardial tissue valve 08/2014 with the RIMA to the RCA 7. Steroid  dependent PMR 8. CVA 2 months ago  NSTEMI --  CABG 1 and aVR pericardial tissue valve 08/2014 with the RIMA to the RCA. Previous history of DES to the mid LAD in 2012 and 2015.   -- Brief CP at home, relatively asymptomatic now.   -- Continue ASA, clopidogrel, heparin, crestor -- Echocardiogram -- cycle troponin, NPO q midnight as a precaution -- not on BB due to previous issues with bradycardia on low dose metoprolol; consider low dose coreg if HR allows this admission -- cath pending renal function assessment and clinical course-- patient and son notes bad hematomas previously with femoral cases.  Possible if troponin remains low to consider medical management.   HTN -- continue amlodipine, imdur 60, lisinopril 10 mg  CKD-- Crt at OSH 1.3, better than prior.  Trend  Chronic diastolic CHF (congestive heart failure) -- continue home lasix 40 mg q AM (holding 20 pm)  DM-- Lantus 16 AM, ACHS  PMR -- continue steroids  Lolita Cram Aunica [place]  MD 11/17/2016, 2:44 AM

## 2016-11-17 NOTE — Progress Notes (Signed)
Patients son states patient takes Hydrocodone 5/325 every  6 hours d/t chronic back pain that is inoperable prescribed by Dr. Carloyn Manner in [place].  Also states patient takes Melatonin at night for sleep.

## 2016-11-17 NOTE — Progress Notes (Signed)
Dr Means was notified twice of elevated troponin (0.39). Patient remains on Heparin gtt.

## 2016-11-17 NOTE — Progress Notes (Addendum)
ANTICOAGULATION CONSULT NOTE - Initial Consult  Pharmacy Consult for Heparin Indication: chest pain/ACS  Allergies  Allergen Reactions  . Clarithromycin     REACTION: Vomiting and diarrhea.  . Codeine Nausea And Vomiting  . Fentanyl Other (See Comments)  . Morphine Nausea And Vomiting  . Statins Other (See Comments)  . Sulfa Antibiotics   . Atorvastatin Other (See Comments)    Made her knees buckle and muscles ache   Patient Measurements: Height: 5\' 2"  (157.5 cm) Weight: 141 lb 3.2 oz (64 kg) (b scale) IBW/kg (Calculated) : 50.1  Vital Signs: Temp: 98.4 F (36.9 C) (10/28 0642) Temp Source: Oral (10/28 0642) BP: 186/66 (10/28 1037) Pulse Rate: 65 (10/28 1037)  Labs (at [facility]): WBC  6.0 Hgb  11.6 Hct  35.7 Plt  116  SCr  1.3  Recent Labs  11/17/16 0226 11/17/16 0930 11/17/16 1257  HGB  --  11.1*  --   HCT  --  34.2*  --   PLT  --  108*  --   LABPROT  --  13.9  --   INR  --  1.08  --   HEPARINUNFRC  --  >2.20* 1.18*  CREATININE  --  1.36*  --   TROPONINI 0.39* 0.29*  --    Estimated Creatinine Clearance: 28.5 mL/min (A) (by C-G formula based on SCr of 1.36 mg/dL (H)).  Medical History: Past Medical History:  Diagnosis Date  . Anemia   . Aortic stenosis    very mild, echo 12/2010  . Blood transfusion   . Bradycardia, sinus   . CAD (coronary artery disease)    Interventions in the past  . Cancer ([place])    ""had hysterectomy for a 6 showing cancer; think that's pretty strong  . Complication of anesthesia    "felt like I was smothering once with mask on my face"  . Diabetes mellitus   . Dyslipidemia   . Ejection fraction    EF 65%, echo, April, 2012, aortic valve sclerosis with very slight gradient  . Femoral bruit    01/2011 hosp, but no pseudoaneurysm or AV fistula  . GERD (gastroesophageal reflux disease)   . Groin hematoma    April, 2011  . Hypertension   . Myocardial infarction [facility]) 2011; 08/2010  . Osteoarthritis    arthroscopic surgery  right knee February, 2012  . Pancreas cyst    The patient has multiple pancreatic cysts.  This is followed at [facility]         . Polymyalgia rheumatica ([place])   . Preoperative evaluation to rule out surgical contraindication    Patient needs back surgery by Dr. Carloyn Manner, May, 2012                . RBBB (right bundle branch block)    old  . Renal insufficiency   . Schatzki's ring   . Shortness of breath on exertion   . Spinal stenosis    history of surgery for this  . Stroke [facility]) 01/18/11   "I've had 2 TIA's"   Medications:  Prescriptions Prior to Admission  Medication Sig Dispense Refill Last Dose  . amLODipine (NORVASC) 10 MG tablet Take 10 mg by mouth daily.     Taking  . aspirin 81 MG tablet Take 81 mg by mouth daily.     Taking  . Calcium Carbonate-Vitamin D (CALCIUM-VITAMIN D) 500-200 MG-UNIT tablet Take 1 tablet by mouth daily.   Taking  . Cholecalciferol (VITAMIN D-3) 1000 units CAPS  Take 1 capsule by mouth daily.   Taking  . divalproex (DEPAKOTE) 500 MG 24 hr tablet Take 500 mg by mouth daily.     Taking  . furosemide (LASIX) 40 MG tablet Take 1 tablet (40 mg total) by mouth daily. 90 tablet 3   . Insulin Glargine (LANTUS SOLOSTAR) 100 UNIT/ML Solostar Pen Inject 16 Units into the skin every morning.    Taking  . insulin lispro (HUMALOG KWIKPEN) 100 UNIT/ML KiwkPen Inject 7 Units into the skin 3 (three) times daily before meals.    Taking  . isosorbide mononitrate (IMDUR) 60 MG 24 hr tablet Take 60 mg by mouth daily.    Taking  . latanoprost (XALATAN) 0.005 % ophthalmic solution Place 1 drop into both eyes at bedtime.     Taking  . lisinopril (PRINIVIL,ZESTRIL) 10 MG tablet Take 10 mg by mouth daily.   Taking  . mirabegron ER (MYRBETRIQ) 25 MG TB24 tablet Take 25 mg by mouth 2 (two) times daily.    Taking  . pantoprazole (PROTONIX) 40 MG tablet Take 40 mg by mouth daily.   Taking  . predniSONE (DELTASONE) 5 MG tablet Take 5 mg by mouth daily. as directed  0 Taking  . rosuvastatin  (CRESTOR) 20 MG tablet Take 20 mg by mouth daily.   Taking  . timolol (TIMOPTIC) 0.5 % ophthalmic solution Place 1 drop into both eyes daily.    Taking  . [DISCONTINUED] HYDROcodone-acetaminophen (NORCO) 10-325 MG tablet Take 1 tablet by mouth every 6 (six) hours as needed.  0 Taking  . [DISCONTINUED] potassium chloride SA (K-DUR,KLOR-CON) 20 MEQ tablet Take 20 mEq by mouth daily.   Taking   Assessment: 83 y.o. female with chest pain for heparin.  Heparin 4000 units IV bolus, 1150 units/hr started at [facility] at 2040. Around 0300 the rate was reduced to 1000 units/hr. Confirmatory lavel this morning resulted >2.20. Called RN who reported no obvious bleeding. Noted PLT 108; STAT heparin level was ordered and RN was asked to hold the infusion once the lab was collected.   STAT heparin level supra therapeutic: 1.18  Goal of Therapy:  Heparin level 0.3-0.7 units/ml Monitor platelets by anticoagulation protocol: Yes   Plan:  Reduce heparin gtt to 850 units/hr (Heparin infusion held for ~ 1 hour) Confirmatory heparin level Daily heparin level/CBC Monitor for s/sx of bleeding  Hristopher Missildine L Danie Diehl 11/17/2016,2:08 PM

## 2016-11-18 ENCOUNTER — Encounter (HOSPITAL_COMMUNITY): Payer: Self-pay | Admitting: Cardiovascular Disease

## 2016-11-18 ENCOUNTER — Encounter (HOSPITAL_COMMUNITY)
Admission: AD | Disposition: A | Discharge status: Home / Self Care | Payer: Self-pay | Source: Other Acute Inpatient Hospital | Attending: Cardiology

## 2016-11-18 HISTORY — PX: LEFT HEART CATH AND CORS/GRAFTS ANGIOGRAPHY: CATH118250

## 2016-11-18 LAB — CBC
HCT: 36.4 % (ref 36.0–46.0)
Hemoglobin: 11.6 g/dL — ABNORMAL LOW (ref 12.0–15.0)
MCH: 28.8 pg (ref 26.0–34.0)
MCHC: 31.9 g/dL (ref 30.0–36.0)
MCV: 90.3 fL (ref 78.0–100.0)
Platelets: 116 10*3/uL — ABNORMAL LOW (ref 150–400)
RBC: 4.03 MIL/uL (ref 3.87–5.11)
RDW: 17 % — ABNORMAL HIGH (ref 11.5–15.5)
WBC: 6.4 10*3/uL (ref 4.0–10.5)

## 2016-11-18 LAB — GLUCOSE, CAPILLARY
GLUCOSE-CAPILLARY: 119 mg/dL — AB (ref 65–99)
GLUCOSE-CAPILLARY: 218 mg/dL — AB (ref 65–99)
Glucose-Capillary: 83 mg/dL (ref 65–99)
Glucose-Capillary: 185 mg/dL — ABNORMAL HIGH (ref 65–99)

## 2016-11-18 LAB — BASIC METABOLIC PANEL WITH GFR
Anion gap: 8 (ref 5–15)
BUN: 29 mg/dL — ABNORMAL HIGH (ref 6–20)
CO2: 29 mmol/L (ref 22–32)
Calcium: 9 mg/dL (ref 8.9–10.3)
Chloride: 105 mmol/L (ref 101–111)
Creatinine, Ser: 1.42 mg/dL — ABNORMAL HIGH (ref 0.44–1.00)
GFR calc Af Amer: 39 mL/min — ABNORMAL LOW
GFR calc non Af Amer: 34 mL/min — ABNORMAL LOW
Glucose, Bld: 95 mg/dL (ref 65–99)
Potassium: 3.9 mmol/L (ref 3.5–5.1)
Sodium: 142 mmol/L (ref 135–145)

## 2016-11-18 LAB — POCT ACTIVATED CLOTTING TIME: ACTIVATED CLOTTING TIME: 136 s

## 2016-11-18 SURGERY — LEFT HEART CATH AND CORS/GRAFTS ANGIOGRAPHY
Anesthesia: LOCAL

## 2016-11-18 MED ORDER — LIDOCAINE HCL 2 % IJ SOLN
INTRAMUSCULAR | Status: AC
  Filled 2016-11-18: qty 20

## 2016-11-18 MED ORDER — HYDRALAZINE HCL 20 MG/ML IJ SOLN
10.0000 mg | Freq: Once | INTRAMUSCULAR | Status: AC
  Administered 2016-11-18: 10 mg via INTRAVENOUS

## 2016-11-18 MED ORDER — MIDAZOLAM HCL 2 MG/2ML IJ SOLN
INTRAMUSCULAR | Status: DC | PRN
  Administered 2016-11-18: 1 mg via INTRAVENOUS

## 2016-11-18 MED ORDER — SODIUM CHLORIDE 0.9 % IV SOLN: INTRAVENOUS | Status: AC

## 2016-11-18 MED ORDER — HEPARIN (PORCINE) IN NACL 2-0.9 UNIT/ML-% IJ SOLN
INTRAMUSCULAR | Status: AC
  Filled 2016-11-18: qty 1000

## 2016-11-18 MED ORDER — SODIUM CHLORIDE 0.9 % IV SOLN: 250.0000 mL | INTRAVENOUS | Status: DC | PRN

## 2016-11-18 MED ORDER — IOPAMIDOL (ISOVUE-370) INJECTION 76%
INTRAVENOUS | Status: DC | PRN
  Administered 2016-11-18: 65 mL

## 2016-11-18 MED ORDER — MIDAZOLAM HCL 2 MG/2ML IJ SOLN
INTRAMUSCULAR | Status: AC
  Filled 2016-11-18: qty 2

## 2016-11-18 MED ORDER — LIDOCAINE HCL 2 % IJ SOLN
INTRAMUSCULAR | Status: DC | PRN
  Administered 2016-11-18: 15 mL

## 2016-11-18 MED ORDER — HYDRALAZINE HCL 20 MG/ML IJ SOLN
INTRAMUSCULAR | Status: AC
  Filled 2016-11-18: qty 1

## 2016-11-18 MED ORDER — SODIUM CHLORIDE 0.9% FLUSH
3.0000 mL | Freq: Two times a day (BID) | INTRAVENOUS | Status: DC
  Administered 2016-11-18 – 2016-11-19 (×2): 3 mL via INTRAVENOUS

## 2016-11-18 MED ORDER — SODIUM CHLORIDE 0.9% FLUSH: 3.0000 mL | INTRAVENOUS | Status: DC | PRN

## 2016-11-18 MED ORDER — IOPAMIDOL (ISOVUE-370) INJECTION 76%
INTRAVENOUS | Status: AC
  Filled 2016-11-18: qty 100

## 2016-11-18 MED ORDER — HEPARIN (PORCINE) IN NACL 2-0.9 UNIT/ML-% IJ SOLN
INTRAMUSCULAR | Status: AC | PRN
  Administered 2016-11-18: 1500 mL

## 2016-11-18 SURGICAL SUPPLY — 9 items
CATH INFINITI 5 FR MPA2 (CATHETERS) ×2 IMPLANT
CATH INFINITI 5FR AL1 (CATHETERS) ×2 IMPLANT
CATH INFINITI 5FR MULTPACK ANG (CATHETERS) ×2 IMPLANT
KIT HEART LEFT (KITS) ×2 IMPLANT
PACK CARDIAC CATHETERIZATION (CUSTOM PROCEDURE TRAY) ×2 IMPLANT
SHEATH PINNACLE 5F 10CM (SHEATH) ×2 IMPLANT
TRANSDUCER W/STOPCOCK (MISCELLANEOUS) ×2 IMPLANT
TUBING CIL FLEX 10 FLL-RA (TUBING) ×2 IMPLANT
WIRE EMERALD 3MM-J .035X150CM (WIRE) ×2 IMPLANT

## 2016-11-18 NOTE — Progress Notes (Signed)
Site area: Right groin a 5 french arterial sheath was removed  Site Prior to Removal:  Level 0  Pressure Applied For 30 MINUTES    Bedrest Beginning at 1240p  Manual:   Yes.    Patient Status During Pull:  stable  Post Pull Groin Site:  Level 0  Post Pull Instructions Given:  Yes.    Post Pull Pulses Present:  Yes.    Dressing Applied:  Yes.  Pressure dressing applied  Comments:  BP med effective

## 2016-11-18 NOTE — Progress Notes (Signed)
  Cath lab called to prepare Mrs. Alford Highland for cath.  Heparin stopped.  Her daughter that was at bedside during morning report is not in room.  Patient stated she probably went to cafeteria and does not know her cell phone #. Attempted to call Patient's son, Karrisa Didio at his listed home and work number, without success.  Will direct her daughter to waiting area when she returns to room.  Patient updated on plan of care.

## 2016-11-18 NOTE — Progress Notes (Signed)
Patient returned from cath lab to 3E30.  Family at bedside.  Pressure dressing clean, dry, and intact to right femoral site.  NS infusing @ 50 ml/hr.  Cath lab RN called with updated report that patient was given 2 doses Hydralazine IV and po doses of Lisinopril and Norvasc to treat high blood pressure.  Patient and family updated on plan of care.

## 2016-11-18 NOTE — Progress Notes (Signed)
Patient transported to cath lab via stretcher

## 2016-11-18 NOTE — Interval H&P Note (Signed)
History and Physical Interval Note:  11/18/2016 9:23 AM  Sharon Hubbard  has presented today for cardiac cath with the diagnosis of NSTEMI  The various methods of treatment have been discussed with the patient and family. After consideration of risks, benefits and other options for treatment, the patient has consented to  Procedure(s): LEFT HEART CATH AND CORS/GRAFTS ANGIOGRAPHY (N/A) as a surgical intervention .  The patient's history has been reviewed, patient examined, no change in status, stable for surgery.  I have reviewed the patient's chart and labs.  Questions were answered to the patient's satisfaction.    Cath Lab Visit (complete for each Cath Lab visit)  Clinical Evaluation Leading to the Procedure:   ACS: Yes.    Non-ACS:    Anginal Classification: CCS III  Anti-ischemic medical therapy: Maximal Therapy (2 or more classes of medications)  Non-Invasive Test Results: No non-invasive testing performed  Prior CABG: Previous CABG         Lauree Chandler

## 2016-11-18 NOTE — Progress Notes (Signed)
Contacted patient's son' wife, via his work number and informed her patient taken to cath lab earlier than scheduled and waiting area on 2nd floor.  She stated she will inform family members.

## 2016-11-19 LAB — CBC
HEMATOCRIT: 36.5 % (ref 36.0–46.0)
HEMOGLOBIN: 11.6 g/dL — AB (ref 12.0–15.0)
MCH: 28.6 pg (ref 26.0–34.0)
MCHC: 31.8 g/dL (ref 30.0–36.0)
MCV: 90.1 fL (ref 78.0–100.0)
Platelets: 118 10*3/uL — ABNORMAL LOW (ref 150–400)
RBC: 4.05 MIL/uL (ref 3.87–5.11)
RDW: 16.9 % — ABNORMAL HIGH (ref 11.5–15.5)
WBC: 5.7 10*3/uL (ref 4.0–10.5)

## 2016-11-19 LAB — GLUCOSE, CAPILLARY
GLUCOSE-CAPILLARY: 140 mg/dL — AB (ref 65–99)
Glucose-Capillary: 237 mg/dL — ABNORMAL HIGH (ref 65–99)

## 2016-11-19 LAB — BASIC METABOLIC PANEL
ANION GAP: 7 (ref 5–15)
BUN: 29 mg/dL — ABNORMAL HIGH (ref 6–20)
CALCIUM: 9.2 mg/dL (ref 8.9–10.3)
CO2: 27 mmol/L (ref 22–32)
Chloride: 108 mmol/L (ref 101–111)
Creatinine, Ser: 1.41 mg/dL — ABNORMAL HIGH (ref 0.44–1.00)
GFR, EST AFRICAN AMERICAN: 39 mL/min — AB (ref >60)
GFR, EST NON AFRICAN AMERICAN: 34 mL/min — AB (ref >60)
GLUCOSE: 139 mg/dL — AB (ref 65–99)
POTASSIUM: 4.3 mmol/L (ref 3.5–5.1)
SODIUM: 142 mmol/L (ref 135–145)

## 2016-11-19 MED ORDER — CLOPIDOGREL BISULFATE 75 MG PO TABS: 75.0000 mg | ORAL_TABLET | Freq: Every day | ORAL | 0 refills | Status: DC

## 2016-11-19 MED ORDER — ALUM & MAG HYDROXIDE-SIMETH 200-200-20 MG/5ML PO SUSP
15.0000 mL | Freq: Four times a day (QID) | ORAL | Status: DC | PRN
  Administered 2016-11-19: 15 mL via ORAL
  Filled 2016-11-19: qty 30

## 2016-11-19 NOTE — Progress Notes (Signed)
Reviewed discharge instructions/medication with patient and patient's daughter. Answered their questions. Placed band aid on groin site. Site looked good. Patient is stable and ready for discharge.

## 2016-11-19 NOTE — Discharge Summary (Signed)
Discharge Summary    Patient ID: Sharon Hubbard,  MRN: [id number], DOB/AGE: 1933/03/28 83 y.o.  Admit date: 11/17/2016 Discharge date: 11/19/2016   Primary Care Provider: Stoney Bang A Primary Cardiologist: Branch  Discharge Diagnoses    Principal Problem:   NSTEMI (non-ST elevated myocardial infarction) ([place])   Allergies Allergies  Allergen Reactions  . Clarithromycin Nausea And Vomiting    diarrhea  . Codeine Nausea And Vomiting  . Fentanyl Other (See Comments)    unknown  . Morphine Nausea And Vomiting  . Statins Other (See Comments)    Myalgias  . Sulfa Antibiotics Other (See Comments)    unknown  . Atorvastatin Other (See Comments)    Made her knees buckle and muscles ache    Diagnostic Studies/Procedures    Cath: 11/18/16  Conclusion     Mid RCA lesion, 100 %stenosed.  RIMA graft was visualized by angiography.  Prox Cx to Dist Cx lesion, 20 %stenosed.  1st Mrg lesion, 40 %stenosed.  Ost LAD to Mid LAD lesion, 10 %stenosed.  Mid LAD to Dist LAD lesion, 30 %stenosed.   1. Chronic total occlusion mid RCA. The distal RCA fills from left to right collaterals. The free RIMA to the RCA is patent to a small branch.  2. Patent proximal and mid LAD stents with minimal restenosis.  3. Mild non-obstructive disease in the Circumflex artery 4. NSTEMI with diffuse underlying CAD. Pt was hypertensive on arrival to cath lab (220/110). Possible demand ischemia given uncontrolled HTN.   Recommendations: Medical management of CAD. Continue ASA and Plavix. Will continue hydration post cath for 8 hours. I would monitor x 24 more hours to follow BP and adjust BP medications.    _____________   History of Present Illness     Sharon Hubbard is a 83 y.o. female with history of CKD, DM, HTN, HLD, CVA 2 months ago, dementia, steroid dependent polymyalgia rheumatica, TIAs, CAD (CABG 1 and aVR 21 mm Edwards Magna ease pericardial tissue valve 08/2014 with  the RIMA to the RCA). Also previous history of DES to the mid LAD in 2012 and 2015.   She presented the evening of admission to the ED [facility]) with acute onset chest pain, which improved by presentation to the ED.  Initial troponin elevated (0.4) and called for transfer due to NSTEMI.  S/P ASA 325, clopidogrel 75 mg qd (chronic home medication), and ACS IV heparin infusion.    In 2017 echo showed normal biventricular function and normal prosthetic aortic valve gradients.  Notably, prior to [place] to RCA, there were attempts at PCI to RCA which were unsuccessful due to inability to cross the stenosis.   On interview, she was cheerful, hard of hearing, denying significant symptoms at this time.  Her son helped with aiding in the story.  She has been recovering from her CVA 2 months prior. She was admitted for further work up with plans for possible cath.     Hospital Course     Underwent cardiac cath with Dr. Angelena Form noted above with nonobstructive CAD. Her blood pressure was noted to be elevated but reported good control overall at home. Troponin peaked at 0.39. No complications noted post cath. She was continued on her home medications without any changes this admission.     Sharon Hubbard was seen by Dr. Burt Knack and determined stable for discharge home. Follow up in the office has been arranged. Medications are listed below.   _____________  Discharge  Vitals Blood pressure (!) 153/63, pulse 76, temperature 98 F (36.7 C), temperature source Oral, resp. rate 16, height 5\' 2"  (1.575 m), weight 137 lb 8 oz (62.4 kg), SpO2 98 %.  Filed Weights   11/17/16 0136 11/18/16 0654 11/19/16 0417  Weight: 141 lb 3.2 oz (64 kg) 137 lb 6.4 oz (62.3 kg) 137 lb 8 oz (62.4 kg)    Labs & Radiologic Studies    CBC  Recent Labs  11/18/16 0613 11/19/16 0607  WBC 6.4 5.7  HGB 11.6* 11.6*  HCT 36.4 36.5  MCV 90.3 90.1  PLT 116* 474*   Basic Metabolic Panel  Recent Labs   11/18/16 0613 11/19/16 0607  NA 142 142  K 3.9 4.3  CL 105 108  CO2 29 27  GLUCOSE 95 139*  BUN 29* 29*  CREATININE 1.42* 1.41*  CALCIUM 9.0 9.2   Liver Function Tests  Recent Labs  11/17/16 0930  AST 30  ALT 21  ALKPHOS 35*  BILITOT 0.6  PROT 5.5*  ALBUMIN 2.9*   No results for input(s): LIPASE, AMYLASE in the last 72 hours. Cardiac Enzymes  Recent Labs  11/17/16 0226 11/17/16 0930 11/17/16 1524  TROPONINI 0.39* 0.29* 0.17*   BNP Invalid input(s): POCBNP D-Dimer No results for input(s): DDIMER in the last 72 hours. Hemoglobin A1C No results for input(s): HGBA1C in the last 72 hours. Fasting Lipid Panel  Recent Labs  11/17/16 0226  CHOL 138  HDL 79  LDLCALC 46  TRIG 63  CHOLHDL 1.7   Thyroid Function Tests No results for input(s): TSH, T4TOTAL, T3FREE, THYROIDAB in the last 72 hours.  Invalid input(s): FREET3 _____________  No results found. Disposition   Pt is being discharged home today in good condition.  Follow-up Plans & Appointments    Follow-up Information    Erma Heritage, PA-C Follow up on 12/05/2016.   Specialties:  Physician Assistant, Cardiology Why:  at 2pm for your follow up appt.  Contact information: [address]  [place] [address] [phone]          Discharge Instructions    Call MD for:  redness, tenderness, or signs of infection (pain, swelling, redness, odor or green/yellow discharge around incision site)    Complete by:  As directed    Diet - low sodium heart healthy    Complete by:  As directed    Discharge instructions    Complete by:  As directed    Groin Site Care Refer to this sheet in the next few weeks. These instructions provide you with information on caring for yourself after your procedure. Your caregiver may also give you more specific instructions. Your treatment has been planned according to current medical practices, but problems sometimes occur. Call your caregiver if you have any  problems or questions after your procedure. HOME CARE INSTRUCTIONS You may shower 24 hours after the procedure. Remove the bandage (dressing) and gently wash the site with plain soap and water. Gently pat the site dry.  Do not apply powder or lotion to the site.  Do not sit in a bathtub, swimming pool, or whirlpool for 5 to 7 days.  No bending, squatting, or lifting anything over 10 pounds (4.5 kg) as directed by your caregiver.  Inspect the site at least twice daily.  Do not drive home if you are discharged the same day of the procedure. Have someone else drive you.  You may drive 24 hours after the procedure unless otherwise instructed by your  caregiver.  What to expect: Any bruising will usually fade within 1 to 2 weeks.  Blood that collects in the tissue (hematoma) may be painful to the touch. It should usually decrease in size and tenderness within 1 to 2 weeks.  SEEK IMMEDIATE MEDICAL CARE IF: You have unusual pain at the groin site or down the affected leg.  You have redness, warmth, swelling, or pain at the groin site.  You have drainage (other than a small amount of blood on the dressing).  You have chills.  You have a fever or persistent symptoms for more than 72 hours.  You have a fever and your symptoms suddenly get worse.  Your leg becomes pale, cool, tingly, or numb.  You have heavy bleeding from the site. Hold pressure on the site. .   Increase activity slowly    Complete by:  As directed       Discharge Medications     Medication List    TAKE these medications   amLODipine 10 MG tablet Commonly known as:  NORVASC Take 10 mg by mouth daily.   aspirin 81 MG tablet Take 81 mg by mouth daily.   calcium-vitamin D 500-200 MG-UNIT tablet Take 1 tablet by mouth daily.   clopidogrel 75 MG tablet Commonly known as:  PLAVIX Take 1 tablet (75 mg total) by mouth daily with breakfast.   divalproex 500 MG 24 hr tablet Commonly known as:  DEPAKOTE ER Take 500 mg by  mouth daily.   furosemide 40 MG tablet Commonly known as:  LASIX Take 1 tablet (40 mg total) by mouth daily.   HUMALOG KWIKPEN 100 UNIT/ML KiwkPen Generic drug:  insulin lispro Inject 7 Units into the skin 3 (three) times daily before meals.   isosorbide mononitrate 60 MG 24 hr tablet Commonly known as:  IMDUR Take 60 mg by mouth daily.   LANTUS SOLOSTAR 100 UNIT/ML Solostar Pen Generic drug:  Insulin Glargine Inject 16 Units into the skin every morning.   latanoprost 0.005 % ophthalmic solution Commonly known as:  XALATAN Place 1 drop into both eyes at bedtime.   lisinopril 10 MG tablet Commonly known as:  PRINIVIL,ZESTRIL Take 10 mg by mouth daily.   MYRBETRIQ 25 MG Tb24 tablet Generic drug:  mirabegron ER Take 25 mg by mouth 2 (two) times daily.   pantoprazole 40 MG tablet Commonly known as:  PROTONIX Take 40 mg by mouth daily.   predniSONE 5 MG tablet Commonly known as:  DELTASONE Take 5 mg by mouth daily. as directed   rosuvastatin 20 MG tablet Commonly known as:  CRESTOR Take 20 mg by mouth daily.   timolol 0.5 % ophthalmic solution Commonly known as:  TIMOPTIC Place 1 drop into both eyes daily.   Vitamin D-3 1000 units Caps Take 1 capsule by mouth daily.          Outstanding Labs/Studies   N/a   Duration of Discharge Encounter   Greater than 30 minutes including physician time.  Signed, Reino Bellis NP-C 11/19/2016, 12:32 PM   Patient seen, examined. Available data reviewed. Agree with findings, assessment, and plan as outlined by Reino Bellis, NP-C. Elderly patient in NAD, lungs CTA< heart RRR with 2/6 SEM at the RUSB, abd soft, NT, extremities without edema. Right groin site clear.   Cath data reviewed - no obstructive CAD. Discussed antihypertensive regimen and she reports good control at home. Will continue furosemide, amlodipine, lisinopril, and imdur. Stable for DC today. Continue ASA, plavix. Plan d/w  daughter. They will monitor  BP at home.   Sherren Mocha, M.D. 11/19/2016 12:32 PM

## 2016-11-19 NOTE — Progress Notes (Signed)
Patient stable, daughter at bedside. No complaints or concerns throughout the night. Patient right groin is still level 0.

## 2016-11-25 DIAGNOSIS — E1165 Type 2 diabetes mellitus with hyperglycemia: Secondary | ICD-10-CM | POA: Diagnosis not present

## 2016-11-25 DIAGNOSIS — I5022 Chronic systolic (congestive) heart failure: Secondary | ICD-10-CM | POA: Diagnosis not present

## 2016-11-25 DIAGNOSIS — J208 Acute bronchitis due to other specified organisms: Secondary | ICD-10-CM | POA: Diagnosis not present

## 2016-11-25 DIAGNOSIS — I10 Essential (primary) hypertension: Secondary | ICD-10-CM | POA: Diagnosis not present

## 2016-12-03 DIAGNOSIS — E119 Type 2 diabetes mellitus without complications: Secondary | ICD-10-CM | POA: Diagnosis not present

## 2016-12-03 DIAGNOSIS — H401133 Primary open-angle glaucoma, bilateral, severe stage: Secondary | ICD-10-CM | POA: Diagnosis not present

## 2016-12-03 NOTE — Progress Notes (Signed)
Cardiology Office Note    Date:  12/05/2016   ID:  Sharon Hubbard, DOB September 02, 1933, MRN [id number]  PCP:  Sharon Burly, MD  Cardiologist: Dr. Harl Hubbard  Chief Complaint  Patient presents with  . Hospitalization Follow-up    History of Present Illness:    Sharon Hubbard is a 83 y.o. female with past medical history of CAD (s/p DES to mid-LAD in 2012 and 2015, CABG with RIMA-RCA in 08/2014), severe AS (s/p AVR with pericardial tissue valve in 08/2014), chronic diastolic CHF, HTN, HLD, dementia and Stage 3 CKD who presents to the office today for hospital follow-up.   She was recently admitted to [facility] from 10/28 - 11/19/2016 as a transfer from [facility]  for evaluation of chest pain. Cyclic troponin values peaked at 0.39 during admission.  Echo was performed and showed a preserved EF of 55-60% with Grade 1 DD and normal functioning of her prosthetic AV.  A cardiac catheterization was performed on 10/29 and showed a CTO of the mid-RCA with a patent RIMA-RCA and distal RCA filling from collaterals, patent proximal and mid LAD stents with minimal restenosis, and mild non-obstructive disease in the Circumflex artery. It was thought her NSTEMI was secondary to demand ischemia in the setting of uncontrolled HTN, however she reported BP had been well-controlled at home (but was elevated to 220/110 at the time of her procedure). She was continued on ASA, Plavix, Amlodipine 10mg  daily, Lasix 40mg  daily, Imdur 60mg  daily, Lisinopril 10mg  daily, and Crestor 20mg  daily.    In talking with the patient today, she reports overall doing well since her recent hospitalization. History is limited in the setting of her dementia and this is mostly obtained by her son who accompanies her to her visit. He reports that she has experienced episodes of chest discomfort which typically occurs when her blood pressure is elevated. She was evaluated by her PCP last week and Lisinopril was increased from 10  mg daily to 20 mg daily at that time.  She does check blood pressure regularly at home and he says this is mostly well controlled but was spiking into the 200's over 110's in the past 2 weeks.  This has improved with titration of her Lisinopril.  She denies any recent dyspnea on exertion, orthopnea, PND, palpitations, or presyncope.  She does have occasional lower extremity edema and reports good compliance with her Lasix dosing.  Her son reports her weight has increased by approximately 12 pounds over the past several months and that she is consuming more food when compared to the prior. Weight was 141 lbs during her hospital admission, at 143 lbs today.    Past Medical History:  Diagnosis Date  . Anemia   . Aortic stenosis    a. s/p AVR with pericardial tissue valve in 08/2014  . Blood transfusion   . Bradycardia, sinus   . CAD (coronary artery disease)    a. s/p DES to mid-LAD in 2012 and 2015 b. CABG with RIMA-RCA in 08/2014 at the time of AVR c. 10/2016: cath showing patent RIMA-RCA and patent stents along LAD with mild-nonobstructive disease along LCx.   . Cancer ([place])    ""had hysterectomy for a 6 showing cancer; think that's pretty strong  . Complication of anesthesia    "felt like I was smothering once with mask on my face"  . Diabetes mellitus   . Dyslipidemia   . Ejection fraction    EF 65%, echo, April, 2012,  aortic valve sclerosis with very slight gradient  . Femoral bruit    01/2011 hosp, but no pseudoaneurysm or AV fistula  . GERD (gastroesophageal reflux disease)   . Groin hematoma    April, 2011  . Hypertension   . Myocardial infarction [facility]) 2011; 08/2010  . Osteoarthritis    arthroscopic surgery right knee February, 2012  . Pancreas cyst    The patient has multiple pancreatic cysts.  This is [facility] at [facility]         . Polymyalgia rheumatica ([place])   . Preoperative evaluation to rule out surgical contraindication    Patient needs back surgery by Dr. Carloyn Manner, May,  2012                . RBBB (right bundle branch block)    old  . Renal insufficiency   . Schatzki's ring   . Shortness of breath on exertion   . Spinal stenosis    history of surgery for this  . Stroke ([place]) 01/18/11   "I've had 2 TIA's"    Past Surgical History:  Procedure Laterality Date  . ABDOMINAL HYSTERECTOMY  ~ 1974  . AORTIC VALVE REPLACEMENT N/A 08/22/2014   Procedure: AORTIC VALVE REPLACEMENT (AVR);  Surgeon: Melrose Nakayama, MD;  Location: [place];  Service: Open Heart Surgery;  Laterality: N/A;  . APPENDECTOMY    . BACK SURGERY    . CARDIAC CATHETERIZATION  01/18/11  . CARDIAC CATHETERIZATION N/A 08/16/2014   Procedure: Left Heart Cath and Coronary Angiography;  Surgeon: Leonie Man, MD;  Location: [place] CV LAB;  Service: Cardiovascular;  Laterality: N/A;  . CARDIAC CATHETERIZATION N/A 08/16/2014   Procedure: Coronary Stent Intervention;  Surgeon: Leonie Man, MD;  Location: [place] CV LAB;  Service: Cardiovascular;  Laterality: N/A;  . CARDIAC CATHETERIZATION N/A 08/17/2014   Procedure: Coronary/Graft Atherectomy;  Surgeon: Burnell Blanks, MD;  Location: [place] CV LAB;  Service: Cardiovascular;  Laterality: N/A;  . CHOLECYSTECTOMY  1987  . CORONARY ANGIOGRAM  01/18/2011   Procedure: CORONARY ANGIOGRAM;  Surgeon: Larey Dresser, MD;  Location: [facility] CATH LAB;  Service: Cardiovascular;;  . CORONARY ANGIOPLASTY WITH STENT PLACEMENT  2011   "in [place]"  . CORONARY ANGIOPLASTY WITH STENT PLACEMENT  08/2010; 03/25/2013  . CORONARY ARTERY BYPASS GRAFT N/A 08/22/2014   Procedure: CORONARY ARTERY BYPASS GRAFTING (CABG), ON PUMP, TIMES ONE, USING RIGHT INTERNAL MAMMARY ARTERY;  Surgeon: Melrose Nakayama, MD;  Location: [place];  Service: Open Heart Surgery;  Laterality: N/A;  . DILATION AND CURETTAGE OF UTERUS    . FEMORAL ARTERY EXPLORATION Right 03/26/2013   Procedure: FEMORAL ARTERY EXPLORATION WITH SUTURE REPAIR; EVACUATION OF HEMATOMA;  Surgeon:  Mal Misty, MD;  Location: [place];  Service: Vascular;  Laterality: Right;  . KNEE ARTHROSCOPY  02/2010   right knee; chrondoplasty medial femoral condyle;  Partial medial meniscectomy.   Marland Kitchen LEFT AND RIGHT HEART CATHETERIZATION WITH CORONARY ANGIOGRAM N/A 03/25/2013   Procedure: LEFT AND RIGHT HEART CATHETERIZATION WITH CORONARY ANGIOGRAM;  Surgeon: Burnell Blanks, MD;  Location: [facility] CATH LAB;  Service: Cardiovascular;  Laterality: N/A;  . LEFT HEART CATH AND CORS/GRAFTS ANGIOGRAPHY N/A 11/18/2016   Procedure: LEFT HEART CATH AND CORS/GRAFTS ANGIOGRAPHY;  Surgeon: Burnell Blanks, MD;  Location: [place] CV LAB;  Service: Cardiovascular;  Laterality: N/A;  . LUMBAR SPINE SURGERY  ~ 1977; ~ 2010   "2 hugh ruptured discs; I was paralyzed first OR; 2nd OR by  Dr. Carloyn Manner, don't know what for"  . TEE WITHOUT CARDIOVERSION N/A 08/22/2014   Procedure: TRANSESOPHAGEAL ECHOCARDIOGRAM (TEE);  Surgeon: Melrose Nakayama, MD;  Location: [place];  Service: Open Heart Surgery;  Laterality: N/A;  . TUBAL LIGATION  1970's    Current Medications: Outpatient Medications Prior to Visit  Medication Sig Dispense Refill  . amLODipine (NORVASC) 10 MG tablet Take 10 mg by mouth daily.      Marland Kitchen aspirin 81 MG tablet Take 81 mg by mouth daily.      . Cholecalciferol (VITAMIN D-3) 1000 units CAPS Take 1 capsule by mouth daily.    . clopidogrel (PLAVIX) 75 MG tablet Take 1 tablet (75 mg total) by mouth daily with breakfast. 90 tablet 0  . divalproex (DEPAKOTE) 500 MG 24 hr tablet Take 500 mg by mouth daily.      . furosemide (LASIX) 20 MG tablet Take 40 mg by mouth. Take 40 mg in the AM and Take 20 mg in the PM    . Insulin Glargine (LANTUS SOLOSTAR) 100 UNIT/ML Solostar Pen Inject 16 Units into the skin every morning.     . insulin lispro (HUMALOG KWIKPEN) 100 UNIT/ML KiwkPen Inject 7 Units into the skin 3 (three) times daily before meals.     . latanoprost (XALATAN) 0.005 % ophthalmic solution Place 1 drop  into both eyes at bedtime.      Marland Kitchen lisinopril (PRINIVIL,ZESTRIL) 20 MG tablet Take 20 mg by mouth.    . pantoprazole (PROTONIX) 40 MG tablet Take 40 mg by mouth daily.    . predniSONE (DELTASONE) 5 MG tablet Take 5 mg by mouth daily. as directed  0  . rosuvastatin (CRESTOR) 20 MG tablet Take 20 mg by mouth daily.    . timolol (TIMOPTIC) 0.5 % ophthalmic solution Place 1 drop into both eyes daily.     . Calcium Carbonate-Vitamin D (CALCIUM-VITAMIN D) 500-200 MG-UNIT tablet Take 1 tablet by mouth daily.    . isosorbide mononitrate (IMDUR) 60 MG 24 hr tablet Take 60 mg by mouth daily.     . mirabegron ER (MYRBETRIQ) 25 MG TB24 tablet Take 25 mg daily by mouth.     . furosemide (LASIX) 40 MG tablet Take 1 tablet (40 mg total) by mouth daily. 90 tablet 3  . lisinopril (PRINIVIL,ZESTRIL) 10 MG tablet Take 10 mg by mouth daily.     No facility-administered medications prior to visit.      Allergies:   Clarithromycin; Codeine; Fentanyl; Morphine; Statins; Sulfa antibiotics; and Atorvastatin   Social History   Socioeconomic History  . Marital status: Married    Spouse name: None  . Number of children: None  . Years of education: None  . Highest education level: None  Social Needs  . Financial resource strain: None  . Food insecurity - worry: None  . Food insecurity - inability: None  . Transportation needs - medical: None  . Transportation needs - non-medical: None  Occupational History  . None  Tobacco Use  . Smoking status: Never Smoker  . Smokeless tobacco: Never Used  Substance and Sexual Activity  . Alcohol use: No    Alcohol/week: 0.0 oz  . Drug use: No  . Sexual activity: No    Birth control/protection: Post-menopausal  Other Topics Concern  . None  Social History Narrative  . None     Family History:  The patient's family history includes Other (age of onset: 53) in her father; Uterine cancer (age of onset: 68)  in her mother.   Review of Systems:   Please see the  history of present illness.     General:  No chills, fever, night sweats or weight changes.  Cardiovascular:  No dyspnea on exertion, edema, orthopnea, palpitations, paroxysmal nocturnal dyspnea. Positive for chest pain.  Dermatological: No rash, lesions/masses Respiratory: No cough, dyspnea Urologic: No hematuria, dysuria Abdominal:   No nausea, vomiting, diarrhea, bright red blood per rectum, melena, or hematemesis Neurologic:  No visual changes, wkns, changes in mental status. All other systems reviewed and are otherwise negative except as noted above.   Physical Exam:    VS:  BP (!) 156/60   Pulse (!) 55   Ht 5\' 3"  (1.6 m)   Wt 143 lb (64.9 kg)   SpO2 98%   BMI 25.33 kg/m    General: Well developed, elderly Caucasian female appearing in no acute distress. Head: Normocephalic, atraumatic, sclera non-icteric, no xanthomas, nares are without discharge.  Neck: No carotid bruits. JVD not elevated.  Lungs: Respirations regular and unlabored, without wheezes or rales.  Heart: Regular rate and rhythm. No S3 or S4.  No murmur, no rubs, or gallops appreciated. Abdomen: Soft, non-tender, non-distended with normoactive bowel sounds. No hepatomegaly. No rebound/guarding. No obvious abdominal masses. Msk:  Strength and tone appear normal for age. No joint deformities or effusions. Extremities: No clubbing or cyanosis. Trace lower extremity edema.  Distal pedal pulses are 2+ bilaterally. Neuro: Alert and oriented X 2 (person, place). Moves all extremities spontaneously. No focal deficits noted. Psych:  Responds to questions appropriately with a normal affect. Skin: No rashes or lesions noted  Wt Readings from Last 3 Encounters:  12/05/16 143 lb (64.9 kg)  11/19/16 137 lb 8 oz (62.4 kg)  12/20/15 125 lb (56.7 kg)    Studies/Labs Reviewed:   EKG:  EKG is not ordered today.   Recent Labs: 11/17/2016: ALT 21 11/19/2016: BUN 29; Creatinine, Ser 1.41; Hemoglobin 11.6; Platelets 118;  Potassium 4.3; Sodium 142   Lipid Panel    Component Value Date/Time   CHOL 138 11/17/2016 0226   TRIG 63 11/17/2016 0226   HDL 79 11/17/2016 0226   CHOLHDL 1.7 11/17/2016 0226   VLDL 13 11/17/2016 0226   LDLCALC 46 11/17/2016 0226    Additional studies/ records that were reviewed today include:   Echocardiogram: 11/17/2016 Study Conclusions  - Left ventricle: The cavity size was normal. There was mild focal   basal hypertrophy of the septum. Systolic function was normal.   The estimated ejection fraction was in the range of 55% to 60%.   Wall motion was normal; there were no regional wall motion   abnormalities. Doppler parameters are consistent with abnormal   left ventricular relaxation (grade 1 diastolic dysfunction). - Aortic valve: A prosthesis was present and functioning normally.   The prosthesis had a normal range of motion. The sewing ring   appeared normal, had no rocking motion, and showed no evidence of   dehiscence. Peak velocity (S): 211 cm/s. Mean gradient (S): 11 mm   Hg. Valve area (VTI): 0.89 cm^2. Valve area (Vmax): 0.97 cm^2.   Valve area (Vmean): 0.85 cm^2. - Mitral valve: Severely calcified annulus. Mildly thickened   leaflets . There was trivial regurgitation. - Left atrium: The atrium was mildly dilated. Volume/bsa, ES   (1-plane Simpson&'s, A4C): 39 ml/m^2.  Impressions:  - Compared to the prior study, there has been no significant   interval change.   Cardiac Catheterization: 11/18/2016  Mid RCA  lesion, 100 %stenosed.  RIMA graft was visualized by angiography.  Prox Cx to Dist Cx lesion, 20 %stenosed.  1st Mrg lesion, 40 %stenosed.  Ost LAD to Mid LAD lesion, 10 %stenosed.  Mid LAD to Dist LAD lesion, 30 %stenosed.   1. Chronic total occlusion mid RCA. The distal RCA fills from left to right collaterals. The free RIMA to the RCA is patent to a small branch.  2. Patent proximal and mid LAD stents with minimal restenosis.  3. Mild  non-obstructive disease in the Circumflex artery 4. NSTEMI with diffuse underlying CAD. Pt was hypertensive on arrival to cath lab (220/110). Possible demand ischemia given uncontrolled HTN.   Recommendations: Medical management of CAD. Continue ASA and Plavix. Will continue hydration post cath for 8 hours. I would monitor x 24 more hours to follow BP and adjust BP medications.    Assessment:    1. Coronary artery disease involving native coronary artery of native heart without angina pectoris   2. Aortic valve stenosis, etiology of cardiac valve disease unspecified   3. Chronic diastolic CHF (congestive heart failure) ([place])   4. Essential hypertension   5. Hyperlipidemia LDL goal <70   6. CKD (chronic kidney disease) stage 3, GFR 30-59 ml/min (HCC)      Plan:   In order of problems listed above:  1. CAD - the patient has a known history of CAD, as she is s/p DES to mid-LAD in 2012 and 2015. Underwent CABG with RIMA-RCA in 08/2014) at the time of her AVR. She was recently admitted for an NSTEMI (peak troponin 0.39) with cardiac catheterization showing a CTO of the mid-RCA with a patent RIMA-RCA and distal RCA filling from collaterals, patent proximal and mid LAD stents with minimal restenosis, and mild non-obstructive disease in the Circumflex artery. It was thought her NSTEMI was secondary to demand ischemia in the setting of uncontrolled HTN.  - she reports episodes of chest discomfort which typically occurs when her blood pressure is elevated. No exertional symptoms noted. Further details are limited secondary to her dementia.  - continue with ASA, Plavix, and statin therapy. Not on a BB secondary to baseline bradycardia. Will increase Imdur from 60mg  daily to 90mg  daily to assist with her anginal symptoms and BP management.   2. Aortic Stenosis - s/p AVR with pericardial tissue valve in 08/2014. - recent echocardiogram on 11/17/2016 showed the prosthesis was functioning normally with  no evidence of dehiscence.   3. Chronic Diastolic CHF - recent echo showed a preserved EF of 55-60% with Grade 1 DD.  - her son mentions her weight has increased over the past several months but no acute changes. She does have trace lower extremity edema on examination but JVD is at baseline and lungs are clear. - continue Lasix 40mg  in AM and 20mg  in PM with instructions to take an extra 20mg  with weight gain > 3 lbs overnight or > 5 lbs in one week. Sodium and fluid restriction reviewed with the patient and her family.   4. HTN - BP is at 156/60 during today's visit.  - Lisinopril was just titrated from 10mg  daily to 20mg  daily last week. Will increase Imdur as above. Recommended she continue to follow BP at home as we can further adjust Lisinopril if needed but would be hesitant to do this with her dose just adjusted last week.   5. HLD - Lipid Panel during recent admission showed total cholesterol of 138, HDL 79, and LDL 46. At  goal with LDL < 70.  - Continue Crestor 20mg  daily.   6. Stage 3 CKD - creatinine stable at 1.41 on 11/19/2016. Continue current medication regimen. Labs checked by PCP during recent visit according to patient's son.     Medication Adjustments/Labs and Tests Ordered: Current medicines are reviewed at length with the patient today.  Concerns regarding medicines are outlined above.  Medication changes, Labs and Tests ordered today are listed in the Patient Instructions below. Patient Instructions  Medication Instructions:  Your physician has recommended you make the following change in your medication:  Increase Imdur to 90 mg Daily  You May Take an extra 20 mg if you gain 3 lbs over night or 5 lbs in one week.   Labwork: NONE   Testing/Procedures: NONE   Follow-Up: Your physician wants you to follow-up in: 6 Months with Dr. Harl Hubbard. You will receive a reminder letter in the mail two months in advance. If you don't receive a letter, please call our office  to schedule the follow-up appointment.  Any Other Special Instructions Will Be Listed Below (If Applicable).  If you need a refill on your cardiac medications before your next appointment, please call your pharmacy. Thank you for choosing [facility]!   Signed, Erma Heritage, PA-C  12/05/2016 5:04 PM    [facility] [place], [place], [place]  [address] Phone: [phone]; Fax: [phone]  [address] [place], [place] [address] Phone: [phone]

## 2016-12-05 ENCOUNTER — Encounter: Payer: Self-pay | Admitting: Student

## 2016-12-05 ENCOUNTER — Ambulatory Visit (INDEPENDENT_AMBULATORY_CARE_PROVIDER_SITE_OTHER): Payer: Medicare Other | Admitting: Student

## 2016-12-05 VITALS — BP 156/60 | HR 55 | Ht 63.0 in | Wt 143.0 lb

## 2016-12-05 DIAGNOSIS — N183 Chronic kidney disease, stage 3 unspecified: Secondary | ICD-10-CM

## 2016-12-05 DIAGNOSIS — I2510 Atherosclerotic heart disease of native coronary artery without angina pectoris: Secondary | ICD-10-CM

## 2016-12-05 DIAGNOSIS — I10 Essential (primary) hypertension: Secondary | ICD-10-CM

## 2016-12-05 DIAGNOSIS — I5032 Chronic diastolic (congestive) heart failure: Secondary | ICD-10-CM

## 2016-12-05 DIAGNOSIS — I350 Nonrheumatic aortic (valve) stenosis: Secondary | ICD-10-CM

## 2016-12-05 DIAGNOSIS — E785 Hyperlipidemia, unspecified: Secondary | ICD-10-CM | POA: Diagnosis not present

## 2016-12-05 MED ORDER — ISOSORBIDE MONONITRATE ER 60 MG PO TB24: 90.0000 mg | ORAL_TABLET | Freq: Every day | ORAL | 11 refills | Status: DC

## 2016-12-05 NOTE — Patient Instructions (Signed)
Medication Instructions:  Your physician has recommended you make the following change in your medication:  Increase Imdur to 90 mg Daily  You May Take an extra 20 mg if you gain 3 lbs over night or 5 lbs in one week.    Labwork: NONE   Testing/Procedures: NONE   Follow-Up: Your physician wants you to follow-up in: 6 Months with Dr. Harl Bowie. You will receive a reminder letter in the mail two months in advance. If you don't receive a letter, please call our office to schedule the follow-up appointment.   Any Other Special Instructions Will Be Listed Below (If Applicable).     If you need a refill on your cardiac medications before your next appointment, please call your pharmacy. Thank you for choosing [facility]!

## 2017-01-16 DIAGNOSIS — E1165 Type 2 diabetes mellitus with hyperglycemia: Secondary | ICD-10-CM | POA: Diagnosis not present

## 2017-01-16 DIAGNOSIS — N3081 Other cystitis with hematuria: Secondary | ICD-10-CM | POA: Diagnosis not present

## 2017-01-16 DIAGNOSIS — I10 Essential (primary) hypertension: Secondary | ICD-10-CM | POA: Diagnosis not present

## 2017-01-16 DIAGNOSIS — I5022 Chronic systolic (congestive) heart failure: Secondary | ICD-10-CM | POA: Diagnosis not present

## 2017-01-18 DIAGNOSIS — K449 Diaphragmatic hernia without obstruction or gangrene: Secondary | ICD-10-CM | POA: Diagnosis not present

## 2017-01-18 DIAGNOSIS — Z8249 Family history of ischemic heart disease and other diseases of the circulatory system: Secondary | ICD-10-CM | POA: Diagnosis not present

## 2017-01-18 DIAGNOSIS — E119 Type 2 diabetes mellitus without complications: Secondary | ICD-10-CM | POA: Diagnosis not present

## 2017-01-18 DIAGNOSIS — Z7982 Long term (current) use of aspirin: Secondary | ICD-10-CM | POA: Diagnosis not present

## 2017-01-18 DIAGNOSIS — Z7952 Long term (current) use of systemic steroids: Secondary | ICD-10-CM | POA: Diagnosis not present

## 2017-01-18 DIAGNOSIS — Z794 Long term (current) use of insulin: Secondary | ICD-10-CM | POA: Diagnosis not present

## 2017-01-18 DIAGNOSIS — N390 Urinary tract infection, site not specified: Secondary | ICD-10-CM | POA: Diagnosis not present

## 2017-01-18 DIAGNOSIS — M549 Dorsalgia, unspecified: Secondary | ICD-10-CM | POA: Diagnosis not present

## 2017-01-18 DIAGNOSIS — Z79899 Other long term (current) drug therapy: Secondary | ICD-10-CM | POA: Diagnosis not present

## 2017-01-18 DIAGNOSIS — I700 Atherosclerosis of aorta: Secondary | ICD-10-CM | POA: Diagnosis not present

## 2017-01-18 DIAGNOSIS — I10 Essential (primary) hypertension: Secondary | ICD-10-CM | POA: Diagnosis not present

## 2017-01-18 DIAGNOSIS — K869 Disease of pancreas, unspecified: Secondary | ICD-10-CM | POA: Diagnosis not present

## 2017-01-18 DIAGNOSIS — R319 Hematuria, unspecified: Secondary | ICD-10-CM | POA: Diagnosis not present

## 2017-01-18 DIAGNOSIS — K5790 Diverticulosis of intestine, part unspecified, without perforation or abscess without bleeding: Secondary | ICD-10-CM | POA: Diagnosis not present

## 2017-01-18 DIAGNOSIS — R079 Chest pain, unspecified: Secondary | ICD-10-CM | POA: Diagnosis not present

## 2017-01-18 DIAGNOSIS — G40909 Epilepsy, unspecified, not intractable, without status epilepticus: Secondary | ICD-10-CM | POA: Diagnosis not present

## 2017-01-18 DIAGNOSIS — Z7902 Long term (current) use of antithrombotics/antiplatelets: Secondary | ICD-10-CM | POA: Diagnosis not present

## 2017-01-18 DIAGNOSIS — I252 Old myocardial infarction: Secondary | ICD-10-CM | POA: Diagnosis not present

## 2017-01-18 DIAGNOSIS — N3289 Other specified disorders of bladder: Secondary | ICD-10-CM | POA: Diagnosis not present

## 2017-01-18 DIAGNOSIS — Z8541 Personal history of malignant neoplasm of cervix uteri: Secondary | ICD-10-CM | POA: Diagnosis not present

## 2017-01-18 DIAGNOSIS — Z833 Family history of diabetes mellitus: Secondary | ICD-10-CM | POA: Diagnosis not present

## 2017-01-18 DIAGNOSIS — I2510 Atherosclerotic heart disease of native coronary artery without angina pectoris: Secondary | ICD-10-CM | POA: Diagnosis not present

## 2017-03-13 DIAGNOSIS — M47816 Spondylosis without myelopathy or radiculopathy, lumbar region: Secondary | ICD-10-CM | POA: Diagnosis not present

## 2017-04-17 DIAGNOSIS — E1165 Type 2 diabetes mellitus with hyperglycemia: Secondary | ICD-10-CM | POA: Diagnosis not present

## 2017-04-17 DIAGNOSIS — I10 Essential (primary) hypertension: Secondary | ICD-10-CM | POA: Diagnosis not present

## 2017-04-17 DIAGNOSIS — I5022 Chronic systolic (congestive) heart failure: Secondary | ICD-10-CM | POA: Diagnosis not present

## 2017-04-17 DIAGNOSIS — N3081 Other cystitis with hematuria: Secondary | ICD-10-CM | POA: Diagnosis not present

## 2017-06-01 ENCOUNTER — Other Ambulatory Visit: Payer: Self-pay | Admitting: Cardiology

## 2017-06-03 DIAGNOSIS — H401133 Primary open-angle glaucoma, bilateral, severe stage: Secondary | ICD-10-CM | POA: Diagnosis not present

## 2017-06-17 DIAGNOSIS — M47816 Spondylosis without myelopathy or radiculopathy, lumbar region: Secondary | ICD-10-CM | POA: Diagnosis not present

## 2017-07-17 DIAGNOSIS — I10 Essential (primary) hypertension: Secondary | ICD-10-CM | POA: Diagnosis not present

## 2017-07-17 DIAGNOSIS — I5022 Chronic systolic (congestive) heart failure: Secondary | ICD-10-CM | POA: Diagnosis not present

## 2017-07-17 DIAGNOSIS — E1165 Type 2 diabetes mellitus with hyperglycemia: Secondary | ICD-10-CM | POA: Diagnosis not present

## 2017-08-06 DIAGNOSIS — L308 Other specified dermatitis: Secondary | ICD-10-CM | POA: Diagnosis not present

## 2017-08-24 ENCOUNTER — Other Ambulatory Visit: Payer: Self-pay | Admitting: Student

## 2017-09-16 DIAGNOSIS — M47816 Spondylosis without myelopathy or radiculopathy, lumbar region: Secondary | ICD-10-CM | POA: Diagnosis not present

## 2017-09-21 ENCOUNTER — Other Ambulatory Visit: Payer: Self-pay | Admitting: Student

## 2017-09-24 ENCOUNTER — Telehealth: Payer: Self-pay | Admitting: Physician Assistant

## 2017-09-24 NOTE — Telephone Encounter (Signed)
New message  Pt c/o swelling: STAT is pt has developed SOB within 24 hours  1) How much weight have you gained and in what time span? Patients daughter states that she has gained 15 lbs in past 2 months   2) If swelling, where is the swelling located? Legs and ankles  3) Are you currently taking a fluid pill? Yes   4) Are you currently SOB? yes  5) Do you have a log of your daily weights (if so, list)? Yes,  but patient's daughter does not have log   6) Have you gained 3 pounds in a day or 5 pounds in a week? No   Have you traveled recently? no

## 2017-09-24 NOTE — Telephone Encounter (Signed)
I will forward to provider for review °

## 2017-09-24 NOTE — Telephone Encounter (Signed)
    She has not been evaluated by myself or Dr. Harl Bowie since 11/2016.  At that time, she was taking Lasix 40mg  in AM and 20mg  in PM with instructions to take an extra 20mg  with weight gain > 3 lbs overnight or > 5 lbs in one week. Has she tried taking any additional Lasix during this time frame? If not, would recommend doing so for the next 3-4 days. She has known Stage 3 CKD and would need a repeat BMET next week for reassessment. Would strongly recommend following daily weights and limiting sodium intake. She should follow-up with Dr. Harl Bowie or myself in the next available slot for further assessment. If respiratory status acutely worsens, should proceed to the ED for further assessment.   Signed, Erma Heritage, PA-C 09/24/2017, 5:17 PM Pager: [phone]

## 2017-09-25 NOTE — Telephone Encounter (Signed)
NA,NM-cc

## 2017-10-01 NOTE — Telephone Encounter (Signed)
Attempt to reach [employer], line is busy-cc

## 2017-10-02 NOTE — Telephone Encounter (Signed)
Son has not weighed Mom but 2 times a week as it is hard to as she has alzheimer's. He also ran out of lasix on Labor day because pcp "doesn't like lasix" He noted without lasix her ankle swelling went away and so he only was giving her lasix 40 mg am, not the pm dose.   Now he notes more SOB, is going to give her the 20 mg pm dose and keep better weight records until f/u apt

## 2017-10-07 ENCOUNTER — Telehealth: Payer: Self-pay | Admitting: Cardiovascular Disease

## 2017-10-07 ENCOUNTER — Other Ambulatory Visit: Payer: Self-pay | Admitting: Student

## 2017-10-07 NOTE — Telephone Encounter (Signed)
Lm what pharmacy ?? Mail order does not give 30 day refills

## 2017-10-07 NOTE — Telephone Encounter (Signed)
New Message:       *STAT* If patient is at the pharmacy, call can be transferred to refill team.   1. Which medications need to be refilled? (please list name of each medication and dose if known) clopidogrel (PLAVIX) 75 MG tablet  2. Which pharmacy/location (including street and city if local pharmacy) is medication to be sent to? TAKE 1 TABLET BY MOUTH ONCE DAILY WITH BREAKFAST 3. Do they need a 30 day or 90 day supply? 30 Days

## 2017-10-08 MED ORDER — CLOPIDOGREL BISULFATE 75 MG PO TABS: ORAL_TABLET | ORAL | 0 refills | Status: DC

## 2017-10-16 DIAGNOSIS — L308 Other specified dermatitis: Secondary | ICD-10-CM | POA: Diagnosis not present

## 2017-10-16 DIAGNOSIS — Z0000 Encounter for general adult medical examination without abnormal findings: Secondary | ICD-10-CM | POA: Diagnosis not present

## 2017-10-16 DIAGNOSIS — I10 Essential (primary) hypertension: Secondary | ICD-10-CM | POA: Diagnosis not present

## 2017-10-16 DIAGNOSIS — E1121 Type 2 diabetes mellitus with diabetic nephropathy: Secondary | ICD-10-CM | POA: Diagnosis not present

## 2017-10-16 DIAGNOSIS — K210 Gastro-esophageal reflux disease with esophagitis: Secondary | ICD-10-CM | POA: Diagnosis not present

## 2017-10-16 DIAGNOSIS — Z1389 Encounter for screening for other disorder: Secondary | ICD-10-CM | POA: Diagnosis not present

## 2017-10-28 ENCOUNTER — Ambulatory Visit (INDEPENDENT_AMBULATORY_CARE_PROVIDER_SITE_OTHER): Payer: Medicare Other | Admitting: Physician Assistant

## 2017-10-28 ENCOUNTER — Encounter: Payer: Self-pay | Admitting: Physician Assistant

## 2017-10-28 VITALS — BP 124/60 | Ht 62.0 in | Wt 152.0 lb

## 2017-10-28 DIAGNOSIS — I2510 Atherosclerotic heart disease of native coronary artery without angina pectoris: Secondary | ICD-10-CM

## 2017-10-28 DIAGNOSIS — Z23 Encounter for immunization: Secondary | ICD-10-CM

## 2017-10-28 DIAGNOSIS — E785 Hyperlipidemia, unspecified: Secondary | ICD-10-CM

## 2017-10-28 DIAGNOSIS — I350 Nonrheumatic aortic (valve) stenosis: Secondary | ICD-10-CM

## 2017-10-28 DIAGNOSIS — I5033 Acute on chronic diastolic (congestive) heart failure: Secondary | ICD-10-CM

## 2017-10-28 NOTE — Patient Instructions (Signed)
Medication Instructions:  Your physician recommends that you continue on your current medications as directed. Please refer to the Current Medication list given to you today.  Daily Weights  Hold Lasix for 1 Week. I weight stays down, do not restart Lasix.  If weight increases call office for instructions.  If you need a refill on your cardiac medications before your next appointment, please call your pharmacy.   Lab work: NONE  If you have labs (blood work) drawn today and your tests are completely normal, you will receive your results only by: Marland Kitchen MyChart Message (if you have MyChart) OR . A paper copy in the mail If you have any lab test that is abnormal or we need to change your treatment, we will call you to review the results.  Testing/Procedures: Your physician recommends that you weigh, daily, at the same time every day, and in the same amount of clothing. Please record your daily weights on the handout provided and bring it to your next appointment.   Follow-Up: At [facility], you and your health needs are our priority.  As part of our continuing mission to provide you with exceptional heart care, we have created designated Provider Care Teams.  These Care Teams include your primary Cardiologist (physician) and Advanced Practice Providers (APPs -  Physician Assistants and Nurse Practitioners) who all work together to provide you with the care you need, when you need it. You will need a follow up appointment in 1 years.  Please call our office 2 months in advance to schedule this appointment.  You may see Carlyle Dolly, MD or one of the following Advanced Practice Providers on your designated Care Team:   Bernerd Pho, PA-C [facility]) . Ermalinda Barrios, PA-C ([place])  Any Other Special Instructions Will Be Listed Below (If Applicable). Thank you for choosing [facility]!

## 2017-10-28 NOTE — Progress Notes (Signed)
Cardiology Office Note   Date:  10/28/2017   ID:  Sharon Hubbard, DOB 01/29/1933, MRN [id number]  PCP:  Neale Burly, MD Cardiologist:  Carlyle Dolly, MD  Rosaria Ferries, PA-C   No chief complaint on file.   History of Present Illness: Sharon Hubbard is a 84 y.o. female with a history of CAD (s/p DES to mid-LAD in 2012 and 2015, CABG with RIMA-RCA in 08/2014), severe AS (s/p AVR with pericardial tissue valve in 08/2014), chronic diastolic CHF, HTN, HLD, dementia and Stage 3 CKD, RBBB   12/05/2016 office visit, CP only w/ elevated BP, volume ok w/ wt [place] presents for cardiology follow up. She is here with her son.  She walks most days, has a route that she follows.   She has memory issues, is not doing as much as she was.   She has a rollator and a walker.   She has chronic DOE, but it is a little worse than previous. She does not weigh daily, weighs regularly. Denies PND, sleeps a little elevated, but that may be from back issues.  She has been waking with LE edema. Her edema actually resolved when she was off the Lasix for a week. It returned after she restarted the Lasix. Her son wonders if they can try her off the Lasix.   Her PCP follows her lipids.   Her BP and CBGs were up, Amaryl was d/c'd and Lantus was increased. However, no improvement until her Amaryl was restarted.  Since then BP and CBGs have improved.   She does not remember having any chest pain. Son has not heard her complain.   She is on chronic steroids for the PMR, this works for her.  Past Medical History:  Diagnosis Date  . Anemia   . Aortic stenosis, severe 08/2014   a. s/p AVR with 21 mm [facility] Ease pericardial tissue valve   . Blood transfusion   . Bradycardia, sinus   . CAD (coronary artery disease)    a. s/p DES to mid-LAD in 2012 and 2015 b. CABG with RIMA-RCA in 08/2014 at the time of AVR c. 10/2016: cath showing patent RIMA-RCA and patent stents  along LAD with mild-nonobstructive disease along LCx.   . Cancer ([place])    ""had hysterectomy for a 6 showing cancer; think that's pretty strong  . Complication of anesthesia    "felt like I was smothering once with mask on my face"  . Diabetes mellitus   . Dyslipidemia   . Ejection fraction    EF 65%, echo, April, 2012, aortic valve sclerosis with very slight gradient  . Femoral bruit    01/2011 hosp, but no pseudoaneurysm or AV fistula  . GERD (gastroesophageal reflux disease)   . Groin hematoma    April, 2011  . Hypertension   . Myocardial infarction [facility]) 2011; 08/2010  . Osteoarthritis    arthroscopic surgery right knee February, 2012  . Pancreas cyst    The patient has multiple pancreatic cysts.  This is followed at [facility]         . Polymyalgia rheumatica ([place])   . Preoperative evaluation to rule out surgical contraindication    Patient needs back surgery by Dr. Carloyn Manner, May, 2012                . RBBB (right bundle branch block)    old  . Renal insufficiency   . Schatzki's ring   .  Shortness of breath on exertion   . Spinal stenosis    history of surgery for this  . Stroke ([place]) 01/18/11   "I've had 2 TIA's"    Past Surgical History:  Procedure Laterality Date  . ABDOMINAL HYSTERECTOMY  ~ 1974  . AORTIC VALVE REPLACEMENT N/A 08/22/2014   Procedure: AORTIC VALVE REPLACEMENT (AVR);  Surgeon: Melrose Nakayama, MD;  Location: [place];  Service: Open Heart Surgery;  Laterality: N/A;  . APPENDECTOMY    . BACK SURGERY    . CARDIAC CATHETERIZATION  01/18/11  . CARDIAC CATHETERIZATION N/A 08/16/2014   Procedure: Left Heart Cath and Coronary Angiography;  Surgeon: Leonie Man, MD;  Location: [place] CV LAB;  Service: Cardiovascular;  Laterality: N/A;  . CARDIAC CATHETERIZATION N/A 08/16/2014   Procedure: Coronary Stent Intervention;  Surgeon: Leonie Man, MD;  Location: [place] CV LAB;  Service: Cardiovascular;  Laterality: N/A;  . CARDIAC CATHETERIZATION N/A  08/17/2014   Procedure: Coronary/Graft Atherectomy;  Surgeon: Burnell Blanks, MD;  Location: [place] CV LAB;  Service: Cardiovascular;  Laterality: N/A;  . CHOLECYSTECTOMY  1987  . CORONARY ANGIOGRAM  01/18/2011   Procedure: CORONARY ANGIOGRAM;  Surgeon: Larey Dresser, MD;  Location: [facility] CATH LAB;  Service: Cardiovascular;;  . CORONARY ANGIOPLASTY WITH STENT PLACEMENT  2011   "in [place]"  . CORONARY ANGIOPLASTY WITH STENT PLACEMENT  08/2010; 03/25/2013  . CORONARY ARTERY BYPASS GRAFT N/A 08/22/2014   Procedure: CORONARY ARTERY BYPASS GRAFTING (CABG), ON PUMP, TIMES ONE, USING RIGHT INTERNAL MAMMARY ARTERY;  Surgeon: Melrose Nakayama, MD;  Location: [place];  Service: Open Heart Surgery;  Laterality: N/A;  . DILATION AND CURETTAGE OF UTERUS    . FEMORAL ARTERY EXPLORATION Right 03/26/2013   Procedure: FEMORAL ARTERY EXPLORATION WITH SUTURE REPAIR; EVACUATION OF HEMATOMA;  Surgeon: Mal Misty, MD;  Location: [place];  Service: Vascular;  Laterality: Right;  . KNEE ARTHROSCOPY  02/2010   right knee; chrondoplasty medial femoral condyle;  Partial medial meniscectomy.   Marland Kitchen LEFT AND RIGHT HEART CATHETERIZATION WITH CORONARY ANGIOGRAM N/A 03/25/2013   Procedure: LEFT AND RIGHT HEART CATHETERIZATION WITH CORONARY ANGIOGRAM;  Surgeon: Burnell Blanks, MD;  Location: [facility] CATH LAB;  Service: Cardiovascular;  Laterality: N/A;  . LEFT HEART CATH AND CORS/GRAFTS ANGIOGRAPHY N/A 11/18/2016   Procedure: LEFT HEART CATH AND CORS/GRAFTS ANGIOGRAPHY;  Surgeon: Burnell Blanks, MD;  Location: [place] CV LAB;  Service: Cardiovascular;  Laterality: N/A;  . LUMBAR SPINE SURGERY  ~ 1977; ~ 2010   "2 hugh ruptured discs; I was paralyzed first OR; 2nd OR by Dr. Carloyn Manner, don't know what for"  . TEE WITHOUT CARDIOVERSION N/A 08/22/2014   Procedure: TRANSESOPHAGEAL ECHOCARDIOGRAM (TEE);  Surgeon: Melrose Nakayama, MD;  Location: [place];  Service: Open Heart Surgery;  Laterality: N/A;  . TUBAL  LIGATION  1970's    Current Outpatient Medications  Medication Sig Dispense Refill  . amLODipine (NORVASC) 10 MG tablet Take 10 mg by mouth daily.      Marland Kitchen aspirin 81 MG tablet Take 81 mg by mouth daily.      . Cholecalciferol (VITAMIN D-3) 1000 units CAPS Take 1 capsule by mouth daily.    . clopidogrel (PLAVIX) 75 MG tablet TAKE 1 TABLET BY MOUTH ONCE DAILY WITH BREAKFAST **  NEEDS  APPOINTMENT  FOR  REFILLS** 90 tablet 0  . divalproex (DEPAKOTE) 500 MG 24 hr tablet Take 500 mg by mouth daily.      Marland Kitchen  furosemide (LASIX) 20 MG tablet Take 40 mg by mouth. Take 40 mg in the AM and Take 20 mg in the PM    . glimepiride (AMARYL) 4 MG tablet Take 4 mg by mouth daily.  0  . HYDROcodone-acetaminophen (NORCO/VICODIN) 5-325 MG tablet Take 1 tablet every 6 hours as needed    . Insulin Glargine (LANTUS SOLOSTAR) 100 UNIT/ML Solostar Pen Inject 16 Units into the skin every morning.     . insulin lispro (HUMALOG KWIKPEN) 100 UNIT/ML KiwkPen Inject 7 Units into the skin 3 (three) times daily before meals.     . isosorbide mononitrate (IMDUR) 60 MG 24 hr tablet Take 1.5 tablets (90 mg total) daily by mouth. 45 tablet 11  . latanoprost (XALATAN) 0.005 % ophthalmic solution Place 1 drop into both eyes at bedtime.      Marland Kitchen lisinopril (PRINIVIL,ZESTRIL) 20 MG tablet Take 20 mg by mouth.    . pantoprazole (PROTONIX) 40 MG tablet Take 40 mg by mouth daily.    . predniSONE (DELTASONE) 5 MG tablet Take 5 mg by mouth daily. as directed  0  . rosuvastatin (CRESTOR) 20 MG tablet Take 20 mg by mouth daily.    . timolol (TIMOPTIC) 0.5 % ophthalmic solution Place 1 drop into both eyes daily.      No current facility-administered medications for this visit.     Allergies:   Clarithromycin; Codeine; Fentanyl; Morphine; Statins; Sulfa antibiotics; and Atorvastatin    Social History:  The patient  reports that she has never smoked. She has never used smokeless tobacco. She reports that she does not drink alcohol or use  drugs.   Family History:  The patient's family history includes Other (age of onset: 71) in her father; Uterine cancer (age of onset: 25) in her mother.    ROS:  Please see the history of present illness. All other systems are reviewed and negative.    PHYSICAL EXAM: VS:  BP 124/60   Ht 5\' 2"  (1.575 m)   Wt 152 lb (68.9 kg)   BMI 27.80 kg/m  , BMI Body mass index is 27.8 kg/m. GEN: Well nourished, well developed, female in no acute distress HEENT: normal for age  Neck:  JVD 9-10 cm, no carotid bruit, no masses Cardiac: RRR; 2/6 murmur, no rubs, or gallops Respiratory: Decreased breath sounds bases with a few rales bilaterally, normal work of breathing GI: soft, nontender, nondistended, + BS MS: no deformity or atrophy; trace pedal edema; distal pulses are 2+ in all 4 extremities  Skin: warm and dry, no rash Neuro:  Strength and sensation are intact Psych: euthymic mood, full affect   EKG:  EKG is ordered today. The ekg ordered today demonstrates sinus rhythm, heart rate 67, right bundle is old  CATH: 11/18/2016  Mid RCA lesion, 100 %stenosed.  RIMA graft was visualized by angiography.  Prox Cx to Dist Cx lesion, 20 %stenosed.  1st Mrg lesion, 40 %stenosed.  Ost LAD to Mid LAD lesion, 10 %stenosed.  Mid LAD to Dist LAD lesion, 30 %stenosed.   1. Chronic total occlusion mid RCA. The distal RCA fills from left to right collaterals. The free RIMA to the RCA is patent to a small branch.  2. Patent proximal and mid LAD stents with minimal restenosis.  3. Mild non-obstructive disease in the Circumflex artery 4. NSTEMI with diffuse underlying CAD. Pt was hypertensive on arrival to cath lab (220/110). Possible demand ischemia given uncontrolled HTN.   Recommendations: Medical management of CAD.  Continue ASA and Plavix. Will continue hydration post cath for 8 hours. I would monitor x 24 more hours to follow BP and adjust BP medications.   Diagnostic Diagram       ECHO:  11/17/2016 - Left ventricle: The cavity size was normal. There was mild focal   basal hypertrophy of the septum. Systolic function was normal.   The estimated ejection fraction was in the range of 55% to 60%.   Wall motion was normal; there were no regional wall motion   abnormalities. Doppler parameters are consistent with abnormal   left ventricular relaxation (grade 1 diastolic dysfunction). - Aortic valve: A prosthesis was present and functioning normally.   The prosthesis had a normal range of motion. The sewing ring   appeared normal, had no rocking motion, and showed no evidence of   dehiscence. Peak velocity (S): 211 cm/s. Mean gradient (S): 11 mm   Hg. Valve area (VTI): 0.89 cm^2. Valve area (Vmax): 0.97 cm^2.   Valve area (Vmean): 0.85 cm^2. - Mitral valve: Severely calcified annulus. Mildly thickened   leaflets . There was trivial regurgitation. - Left atrium: The atrium was mildly dilated. Volume/bsa, ES   (1-plane Simpson&'s, A4C): 39 ml/m^2.  Impressions:  - Compared to the prior study, there has been no significant   interval change.  Recent Labs: 11/17/2016: ALT 21 11/19/2016: BUN 29; Creatinine, Ser 1.41; Hemoglobin 11.6; Platelets 118; Potassium 4.3; Sodium 142    Lipid Panel    Component Value Date/Time   CHOL 138 11/17/2016 0226   TRIG 63 11/17/2016 0226   HDL 79 11/17/2016 0226   CHOLHDL 1.7 11/17/2016 0226   VLDL 13 11/17/2016 0226   LDLCALC 46 11/17/2016 0226     Wt Readings from Last 3 Encounters:  10/28/17 152 lb (68.9 kg)  12/05/16 143 lb (64.9 kg)  11/19/16 137 lb 8 oz (62.4 kg)     Other studies Reviewed: Additional studies/ records that were reviewed today include: office notes, hospital records and testing.  ASSESSMENT AND PLAN:  1.  CAD: She is having no obvious ischemic symptoms.  Continue aspirin 81 mg a day, Plavix, Imdur, lisinopril and statin. -07/07/2015 office visit, she was on metoprolol 12.5 mg twice daily, but she had  recently been hospitalized at [facility] for syncope and had possibly been started on midodrine. -She has had some bradycardia with a blood pressure note 11/24/2015 listing a heart rate of 53.  Since then, she has not been on a beta-blocker  2. Acute on chronic diastolic CHF: She has gained some weight, has JVD and lower extremity edema as well as some rales in her bases. -However, according to her son, her lower extremity edema resolved when she was off the Lasix for a week. - Her family monitors what she eats, there is no chance of dietary indiscretion. - They will start daily weights.  Hold the Lasix for a week.  If her lower extremity edema improves, and her weight decreases, stay off it - If the edema and weight do not improve, increase Lasix from 40 mg a.m./20 mg p.m. up to 80 mg twice daily for 3 days.  If she does this, she will need a BMET in 2 weeks.  3.  Hyperlipidemia, goal LDL less than 70: In 2012, her total cholesterol was over 300 with elevated triglycerides.  A year ago, her lipid profile was at goal.  This is monitored by her PCP, continue Crestor  4.  Severe aortic stenosis, status post pericardial  tissue valve: Her murmur is not loud and her mean gradient was only 56mmHg a year ago.  No further testing at this time.   Current medicines are reviewed at length with the patient today.  The patient has concerns regarding medicines.  Concerns were addressed  The following changes have been made: Hold the Lasix for a week, if her volume status does not improve, change the Lasix to 80 mg twice daily for 3 days and then resume 40/20 mg.  If she does this, BMET in 2 weeks  Labs/ tests ordered today include:   Orders Placed This Encounter  Procedures  . Flu Vaccine QUAD 36+ mos IM  . EKG 12-Lead     Disposition:   FU with Carlyle Dolly, MD  Signed, Rosaria Ferries, PA-C  10/28/2017 5:06 PM    [place] Phone: [phone]; Fax: [phone]  This  note was written with the assistance of speech recognition software.  Please excuse any transcriptional errors.

## 2017-11-17 ENCOUNTER — Telehealth: Payer: Self-pay | Admitting: Physician Assistant

## 2017-11-17 NOTE — Telephone Encounter (Signed)
Called Ms. Aguila's home as she had been thinking that her edema would get better she held her Lasix.  She did hold the Lasix, but she started swelling again, so the family started giving it to her again.  She is currently taking a whole tablet in the morning and 1/2 tablet in the afternoon.  They are weighing her every day, her weight is stable.  She has a little bit of edema that is chronic on her left leg, but this is unchanged.  No swelling on the right.  Her respiratory status is at baseline.  Congratulated them on doing a good job taking care of her.  Patient's daughter was grateful for the call.  Rosaria Ferries, PA-C 11/17/2017 9:36 AM [facility] [phone]

## 2017-12-09 DIAGNOSIS — H401133 Primary open-angle glaucoma, bilateral, severe stage: Secondary | ICD-10-CM | POA: Diagnosis not present

## 2017-12-09 DIAGNOSIS — E119 Type 2 diabetes mellitus without complications: Secondary | ICD-10-CM | POA: Diagnosis not present

## 2017-12-09 DIAGNOSIS — Z794 Long term (current) use of insulin: Secondary | ICD-10-CM | POA: Diagnosis not present

## 2017-12-11 DIAGNOSIS — M47816 Spondylosis without myelopathy or radiculopathy, lumbar region: Secondary | ICD-10-CM | POA: Diagnosis not present

## 2017-12-16 ENCOUNTER — Other Ambulatory Visit: Payer: Self-pay | Admitting: Cardiology

## 2018-01-08 ENCOUNTER — Encounter: Payer: Self-pay | Admitting: Student

## 2018-01-08 ENCOUNTER — Other Ambulatory Visit (HOSPITAL_COMMUNITY)
Admission: RE | Admit: 2018-01-08 | Discharge: 2018-01-08 | Disposition: A | Discharge status: Home / Self Care | Payer: Medicare Other | Source: Ambulatory Visit | Attending: Student | Admitting: Student

## 2018-01-08 ENCOUNTER — Ambulatory Visit (INDEPENDENT_AMBULATORY_CARE_PROVIDER_SITE_OTHER): Payer: Medicare Other | Admitting: Student

## 2018-01-08 VITALS — BP 148/62 | HR 87 | Ht 63.0 in | Wt 159.0 lb

## 2018-01-08 DIAGNOSIS — I2510 Atherosclerotic heart disease of native coronary artery without angina pectoris: Secondary | ICD-10-CM | POA: Diagnosis not present

## 2018-01-08 DIAGNOSIS — N183 Chronic kidney disease, stage 3 unspecified: Secondary | ICD-10-CM

## 2018-01-08 DIAGNOSIS — I5032 Chronic diastolic (congestive) heart failure: Secondary | ICD-10-CM | POA: Diagnosis not present

## 2018-01-08 DIAGNOSIS — I10 Essential (primary) hypertension: Secondary | ICD-10-CM

## 2018-01-08 DIAGNOSIS — F0390 Unspecified dementia without behavioral disturbance: Secondary | ICD-10-CM | POA: Diagnosis not present

## 2018-01-08 DIAGNOSIS — R600 Localized edema: Secondary | ICD-10-CM | POA: Diagnosis not present

## 2018-01-08 DIAGNOSIS — I350 Nonrheumatic aortic (valve) stenosis: Secondary | ICD-10-CM | POA: Diagnosis not present

## 2018-01-08 LAB — BASIC METABOLIC PANEL
ANION GAP: 12 (ref 5–15)
BUN: 31 mg/dL — ABNORMAL HIGH (ref 8–23)
CALCIUM: 9.4 mg/dL (ref 8.9–10.3)
CO2: 27 mmol/L (ref 22–32)
Chloride: 100 mmol/L (ref 98–111)
Creatinine, Ser: 1.47 mg/dL — ABNORMAL HIGH (ref 0.44–1.00)
GFR calc non Af Amer: 33 mL/min — ABNORMAL LOW (ref >60)
GFR, EST AFRICAN AMERICAN: 38 mL/min — AB (ref >60)
Glucose, Bld: 203 mg/dL — ABNORMAL HIGH (ref 70–99)
Potassium: 4.2 mmol/L (ref 3.5–5.1)
SODIUM: 139 mmol/L (ref 135–145)

## 2018-01-08 LAB — BRAIN NATRIURETIC PEPTIDE: B Natriuretic Peptide: 232 pg/mL — ABNORMAL HIGH (ref 0.0–100.0)

## 2018-01-08 MED ORDER — FUROSEMIDE 40 MG PO TABS: 40.0000 mg | ORAL_TABLET | Freq: Two times a day (BID) | ORAL | 11 refills | Status: DC

## 2018-01-08 NOTE — Patient Instructions (Signed)
Medication Instructions:  Your physician recommends that you continue on your current medications as directed. Please refer to the Current Medication list given to you today.  Lasix 40 mg Two Times Daily   If you need a refill on your cardiac medications before your next appointment, please call your pharmacy.   Lab work: Your physician recommends that you return for lab work in: Today   If you have labs (blood work) drawn today and your tests are completely normal, you will receive your results only by: Marland Kitchen MyChart Message (if you have MyChart) OR . A paper copy in the mail If you have any lab test that is abnormal or we need to change your treatment, we will call you to review the results.  Testing/Procedures: NONE   Follow-Up: At [facility], you and your health needs are our priority.  As part of our continuing mission to provide you with exceptional heart care, we have created designated Provider Care Teams.  These Care Teams include your primary Cardiologist (physician) and Advanced Practice Providers (APPs -  Physician Assistants and Nurse Practitioners) who all work together to provide you with the care you need, when you need it. You will need a follow up appointment in 4-6  weeks.  Please call our office 2 months in advance to schedule this appointment.  You may see Carlyle Dolly, MD or one of the following Advanced Practice Providers on your designated Care Team:   Bernerd Pho, PA-C [facility]) . Ermalinda Barrios, PA-C ([place])  Any Other Special Instructions Will Be Listed Below (If Applicable). Thank you for choosing [facility]!

## 2018-01-08 NOTE — Progress Notes (Signed)
Cardiology Office Note    Date:  01/08/2018   ID:  BRUNILDA EBLE, DOB 29-Aug-1933, MRN [id number]  PCP:  Neale Burly, MD  Cardiologist: Carlyle Dolly, MD    Chief Complaint  Patient presents with  . Follow-up    2 month visit    History of Present Illness:    Sharon Hubbard is a 84 y.o. female with past medical history of CAD (s/p DES to mid-LAD in 2012 and 2015, CABG with RIMA-RCA in 08/2014, cath in 10/2016 showing patent proximal LAD stents and known CTO of mid-RCA with dRCA filling from collaterals and patent RIMA-RCA), severe AS (s/p AVR with pericardial tissue valve in 08/2014), chronic diastolic CHF, HTN, HLD, dementia and Stage 3 CKD who presents to the office today for 49-month follow-up.  She was last examined by Rosaria Ferries, PA-C on 10/28/2017 and reported having chronic dyspnea on exertion but had noticed her symptoms were worse over the past month. Reported having lower extremity edema which actually improved when she was not taking Lasix for several weeks but her breathing worsened during this timeframe. It was recommended that they follow daily weights and hold the Lasix for a week for if her lower extremity edema improved and weight decreased, she could remain off of Lasix. If weight did start to increase, was recommended to increase Lasix from 40 mg in AM/20mg  in PM to 80mg  BID for several days. Suanne Marker reached out to the patient's family on 11/17/2017 and her edema had worsened off of Lasix, therefore they resumed Lasix at her baseline dosing and were obtaining daily weights.   In talking with the patient and her son today, most history is provided by the son due to the patient's dementia which has progressed per his report. She has experienced a weight gain of over 10 pounds within the past few months but he reports this has overall been gradual and feels like it might be due to her increased appetite as she "stays hungry" all the time.  She has  experienced worsening lower extremity edema since her last office visit and her son did titrate Lasix to 40 mg twice daily for the past several weeks with minimal improvement noted. He also reports that her SBP has been elevated in the 140's to 160's over the past several weeks.  She denies any recent chest pain, palpitations, orthopnea, or PND.  Family does most of the cooking and he reports they do not add any salt to her food.   Past Medical History:  Diagnosis Date  . Anemia   . Aortic stenosis, severe 08/2014   a. s/p AVR with 21 mm [facility] Ease pericardial tissue valve   . Blood transfusion   . Bradycardia, sinus   . CAD (coronary artery disease)    a. s/p DES to mid-LAD in 2012 and 2015 b. CABG with RIMA-RCA in 08/2014 at the time of AVR c. 10/2016: cath showing patent RIMA-RCA and patent stents along LAD with mild-nonobstructive disease along LCx.   . Cancer ([place])    ""had hysterectomy for a 6 showing cancer; think that's pretty strong  . Complication of anesthesia    "felt like I was smothering once with mask on my face"  . Diabetes mellitus   . Dyslipidemia   . Ejection fraction    EF 65%, echo, April, 2012, aortic valve sclerosis with very slight gradient  . Femoral bruit    01/2011 hosp, but no pseudoaneurysm or AV fistula  .  GERD (gastroesophageal reflux disease)   . Groin hematoma    April, 2011  . Hypertension   . Myocardial infarction [facility]) 2011; 08/2010  . Osteoarthritis    arthroscopic surgery right knee February, 2012  . Pancreas cyst    The patient has multiple pancreatic cysts.  This is followed at [facility]         . Polymyalgia rheumatica ([place])   . Preoperative evaluation to rule out surgical contraindication    Patient needs back surgery by Dr. Carloyn Manner, May, 2012                . RBBB (right bundle branch block)    old  . Renal insufficiency   . Schatzki's ring   . Shortness of breath on exertion   . Spinal stenosis    history of surgery for this  .  Stroke ([place]) 01/18/11   "I've had 2 TIA's"    Past Surgical History:  Procedure Laterality Date  . ABDOMINAL HYSTERECTOMY  ~ 1974  . AORTIC VALVE REPLACEMENT N/A 08/22/2014   Procedure: AORTIC VALVE REPLACEMENT (AVR);  Surgeon: Melrose Nakayama, MD;  Location: [facility];  Service: Open Heart Surgery;  Laterality: N/A;  . APPENDECTOMY    . BACK SURGERY    . CARDIAC CATHETERIZATION  01/18/11  . CARDIAC CATHETERIZATION N/A 08/16/2014   Procedure: Left Heart Cath and Coronary Angiography;  Surgeon: Leonie Man, MD;  Location: [place] CV LAB;  Service: Cardiovascular;  Laterality: N/A;  . CARDIAC CATHETERIZATION N/A 08/16/2014   Procedure: Coronary Stent Intervention;  Surgeon: Leonie Man, MD;  Location: [place] CV LAB;  Service: Cardiovascular;  Laterality: N/A;  . CARDIAC CATHETERIZATION N/A 08/17/2014   Procedure: Coronary/Graft Atherectomy;  Surgeon: Burnell Blanks, MD;  Location: [place] CV LAB;  Service: Cardiovascular;  Laterality: N/A;  . CHOLECYSTECTOMY  1987  . CORONARY ANGIOGRAM  01/18/2011   Procedure: CORONARY ANGIOGRAM;  Surgeon: Larey Dresser, MD;  Location: [facility] CATH LAB;  Service: Cardiovascular;;  . CORONARY ANGIOPLASTY WITH STENT PLACEMENT  2011   "in [place]"  . CORONARY ANGIOPLASTY WITH STENT PLACEMENT  08/2010; 03/25/2013  . CORONARY ARTERY BYPASS GRAFT N/A 08/22/2014   Procedure: CORONARY ARTERY BYPASS GRAFTING (CABG), ON PUMP, TIMES ONE, USING RIGHT INTERNAL MAMMARY ARTERY;  Surgeon: Melrose Nakayama, MD;  Location: [place];  Service: Open Heart Surgery;  Laterality: N/A;  . DILATION AND CURETTAGE OF UTERUS    . FEMORAL ARTERY EXPLORATION Right 03/26/2013   Procedure: FEMORAL ARTERY EXPLORATION WITH SUTURE REPAIR; EVACUATION OF HEMATOMA;  Surgeon: Mal Misty, MD;  Location: [place];  Service: Vascular;  Laterality: Right;  . KNEE ARTHROSCOPY  02/2010   right knee; chrondoplasty medial femoral condyle;  Partial medial meniscectomy.   Marland Kitchen LEFT AND  RIGHT HEART CATHETERIZATION WITH CORONARY ANGIOGRAM N/A 03/25/2013   Procedure: LEFT AND RIGHT HEART CATHETERIZATION WITH CORONARY ANGIOGRAM;  Surgeon: Burnell Blanks, MD;  Location: [facility] CATH LAB;  Service: Cardiovascular;  Laterality: N/A;  . LEFT HEART CATH AND CORS/GRAFTS ANGIOGRAPHY N/A 11/18/2016   Procedure: LEFT HEART CATH AND CORS/GRAFTS ANGIOGRAPHY;  Surgeon: Burnell Blanks, MD;  Location: [place] CV LAB;  Service: Cardiovascular;  Laterality: N/A;  . LUMBAR SPINE SURGERY  ~ 1977; ~ 2010   "2 hugh ruptured discs; I was paralyzed first OR; 2nd OR by Dr. Carloyn Manner, don't know what for"  . TEE WITHOUT CARDIOVERSION N/A 08/22/2014   Procedure: TRANSESOPHAGEAL ECHOCARDIOGRAM (TEE);  Surgeon: Melrose Nakayama,  MD;  Location: [facility] OR;  Service: Open Heart Surgery;  Laterality: N/A;  . TUBAL LIGATION  1970's    Current Medications: Outpatient Medications Prior to Visit  Medication Sig Dispense Refill  . amLODipine (NORVASC) 10 MG tablet Take 10 mg by mouth daily.      Marland Kitchen aspirin 81 MG tablet Take 81 mg by mouth daily.      . Cholecalciferol (VITAMIN D-3) 1000 units CAPS Take 1 capsule by mouth daily.    . clopidogrel (PLAVIX) 75 MG tablet TAKE 1 TABLET BY MOUTH ONCE DAILY WITH BREAKFAST 90 tablet 2  . divalproex (DEPAKOTE) 500 MG 24 hr tablet Take 500 mg by mouth daily.      Marland Kitchen glimepiride (AMARYL) 4 MG tablet Take 4 mg by mouth daily.  0  . HYDROcodone-acetaminophen (NORCO/VICODIN) 5-325 MG tablet Take 1 tablet every 6 hours as needed    . Insulin Glargine (LANTUS SOLOSTAR) 100 UNIT/ML Solostar Pen Inject 16 Units into the skin every morning.     . insulin lispro (HUMALOG KWIKPEN) 100 UNIT/ML KiwkPen Inject 7 Units into the skin 3 (three) times daily before meals.     . latanoprost (XALATAN) 0.005 % ophthalmic solution Place 1 drop into both eyes at bedtime.      Marland Kitchen lisinopril (PRINIVIL,ZESTRIL) 20 MG tablet Take 20 mg by mouth.    . pantoprazole (PROTONIX) 40 MG tablet Take 40 mg  by mouth daily.    . predniSONE (DELTASONE) 5 MG tablet Take 5 mg by mouth daily. as directed  0  . rosuvastatin (CRESTOR) 20 MG tablet Take 20 mg by mouth daily.    . timolol (TIMOPTIC) 0.5 % ophthalmic solution Place 1 drop into both eyes daily.     . furosemide (LASIX) 20 MG tablet Take 40 mg by mouth. Take 40 mg in the AM and Take 20 mg in the PM    . isosorbide mononitrate (IMDUR) 60 MG 24 hr tablet Take 1.5 tablets (90 mg total) daily by mouth. 45 tablet 11   No facility-administered medications prior to visit.      Allergies:   Clarithromycin; Codeine; Fentanyl; Morphine; Statins; Sulfa antibiotics; and Atorvastatin   Social History   Socioeconomic History  . Marital status: Married    Spouse name: Not on file  . Number of children: Not on file  . Years of education: Not on file  . Highest education level: Not on file  Occupational History  . Not on file  Social Needs  . Financial resource strain: Not on file  . Food insecurity:    Worry: Not on file    Inability: Not on file  . Transportation needs:    Medical: Not on file    Non-medical: Not on file  Tobacco Use  . Smoking status: Never Smoker  . Smokeless tobacco: Never Used  Substance and Sexual Activity  . Alcohol use: No    Alcohol/week: 0.0 standard drinks  . Drug use: No  . Sexual activity: Never    Birth control/protection: Post-menopausal  Lifestyle  . Physical activity:    Days per week: Not on file    Minutes per session: Not on file  . Stress: Not on file  Relationships  . Social connections:    Talks on phone: Not on file    Gets together: Not on file    Attends religious service: Not on file    Active member of club or organization: Not on file    Attends meetings of  clubs or organizations: Not on file    Relationship status: Not on file  Other Topics Concern  . Not on file  Social History Narrative  . Not on file     Family History:  The patient's family history includes Other (age of  onset: 77) in her father; Uterine cancer (age of onset: 38) in her mother.   Review of Systems:   Please see the history of present illness.     General:  No chills, fever, night sweats or weight changes.  Cardiovascular:  No chest pain, orthopnea, palpitations, paroxysmal nocturnal dyspnea. Positive for edema and dyspnea on exertion.  Dermatological: No rash, lesions/masses Respiratory: No cough, dyspnea Urologic: No hematuria, dysuria Abdominal:   No nausea, vomiting, diarrhea, bright red blood per rectum, melena, or hematemesis Neurologic:  No visual changes, wkns, changes in mental status. All other systems reviewed and are otherwise negative except as noted above.   Physical Exam:    VS:  BP (!) 148/62   Pulse 87   Ht 5\' 3"  (1.6 m)   Wt 159 lb (72.1 kg)   SpO2 99%   BMI 28.17 kg/m    General: Well developed, well nourished elderly Caucasian female appearing in no acute distress. Head: Normocephalic, atraumatic, sclera non-icteric, no xanthomas, nares are without discharge.  Neck: No carotid bruits. JVD at 8 cm.  Lungs: Respirations regular and unlabored, without wheezes or rales.  Heart: Regular rate and rhythm. No S3 or S4.  No rubs or gallops appreciated. 2/6 SEM along RUSB.  Abdomen: Soft, non-tender, non-distended with normoactive bowel sounds. No hepatomegaly. No rebound/guarding. No obvious abdominal masses. Msk:  Strength and tone appear normal for age. No joint deformities or effusions. Extremities: No clubbing or cyanosis. 1+ pitting edema bilaterally.  Distal pedal pulses are 2+ bilaterally. Neuro: Alert and oriented X 3. Moves all extremities spontaneously. No focal deficits noted. Psych:  Responds to questions appropriately with a normal affect. Skin: No rashes or lesions noted  Wt Readings from Last 3 Encounters:  01/08/18 159 lb (72.1 kg)  10/28/17 152 lb (68.9 kg)  12/05/16 143 lb (64.9 kg)     Studies/Labs Reviewed:   EKG:  EKG is not ordered today.    Recent Labs: 01/08/2018: B Natriuretic Peptide 232.0; BUN 31; Creatinine, Ser 1.47; Potassium 4.2; Sodium 139   Lipid Panel    Component Value Date/Time   CHOL 138 11/17/2016 0226   TRIG 63 11/17/2016 0226   HDL 79 11/17/2016 0226   CHOLHDL 1.7 11/17/2016 0226   VLDL 13 11/17/2016 0226   LDLCALC 46 11/17/2016 0226    Additional studies/ records that were reviewed today include:   Echocardiogram: 10/2016 Study Conclusions  - Left ventricle: The cavity size was normal. There was mild focal   basal hypertrophy of the septum. Systolic function was normal.   The estimated ejection fraction was in the range of 55% to 60%.   Wall motion was normal; there were no regional wall motion   abnormalities. Doppler parameters are consistent with abnormal   left ventricular relaxation (grade 1 diastolic dysfunction). - Aortic valve: A prosthesis was present and functioning normally.   The prosthesis had a normal range of motion. The sewing ring   appeared normal, had no rocking motion, and showed no evidence of   dehiscence. Peak velocity (S): 211 cm/s. Mean gradient (S): 11 mm   Hg. Valve area (VTI): 0.89 cm^2. Valve area (Vmax): 0.97 cm^2.   Valve area (Vmean): 0.85 cm^2. -  Mitral valve: Severely calcified annulus. Mildly thickened   leaflets . There was trivial regurgitation. - Left atrium: The atrium was mildly dilated. Volume/bsa, ES   (1-plane Simpson&'s, A4C): 39 ml/m^2.  Impressions:  - Compared to the prior study, there has been no significant   interval change.  Cardiac Catheterization: 10/2016  Mid RCA lesion, 100 %stenosed.  RIMA graft was visualized by angiography.  Prox Cx to Dist Cx lesion, 20 %stenosed.  1st Mrg lesion, 40 %stenosed.  Ost LAD to Mid LAD lesion, 10 %stenosed.  Mid LAD to Dist LAD lesion, 30 %stenosed.   1. Chronic total occlusion mid RCA. The distal RCA fills from left to right collaterals. The free RIMA to the RCA is patent to a small  branch.  2. Patent proximal and mid LAD stents with minimal restenosis.  3. Mild non-obstructive disease in the Circumflex artery 4. NSTEMI with diffuse underlying CAD. Pt was hypertensive on arrival to cath lab (220/110). Possible demand ischemia given uncontrolled HTN.   Recommendations: Medical management of CAD. Continue ASA and Plavix. Will continue hydration post cath for 8 hours. I would monitor x 24 more hours to follow BP and adjust BP medications.    Assessment:    1. Coronary artery disease involving native coronary artery of native heart without angina pectoris   2. Chronic diastolic CHF (congestive heart failure) ([place])   3. Lower extremity edema   4. Severe aortic stenosis, s/p pericardial tissue valve 2016   5. Essential hypertension   6. CKD (chronic kidney disease) stage 3, GFR 30-59 ml/min (HCC)   7. Dementia without behavioral disturbance, unspecified dementia type ([place])      Plan:   In order of problems listed above:  1. CAD - the patient is s/p DES to mid-LAD in 2012 and 2015 with RIMA-RCA in 08/2014. Most recent cath in 10/2016 showed patent proximal LAD stents and known CTO of mid-RCA with dRCA filling from collaterals and patent RIMA-RCA.  - she has baseline dyspnea on exertion but denies any recent change in her symptoms. No recent chest pain.  - continue ASA, Plavix, Imdur, and statin therapy.  2. Chronic Diastolic CHF/Lower Extremity Edema - weight has increased by 10 lbs over the past few months but her son reports this has been a gradual shift. She has experienced worsening edema and they did self-titrate Lasix from 40mg  in AM/20mg  in PM to 40mg  BID with minimal improvement in her symptoms. She does utilize compression stockings and elevates her legs during the day.  - given her variable kidney function and recent Lasix dose change, will recheck BNP and BMET today. If BNP elevated and kidney function overall stable, would consider transitioning Lasix to  Torsemide for improved bioavailability as symptoms did not improve with titration of her Lasix dosing.   3. Severe AS -  s/p AVR with pericardial tissue valve in 08/2014. Echocardiogram in 2018 showed the prosthesis was functioning normally.   4. HTN - BP initially elevated at 166/78, improved to 148/62 on recheck. She is currently on Amlodipine 10mg  daily, Lisinopril 20mg  daily, and Imdur 60mg  daily. Pending BMET, Lisinopril could be further titrated if BP remains above goal.   5. Stage 3 CKD - baseline creatinine 1.4 - 1.5. Stable at 1.41 when checked in 10/2016 which is the most recent lab result in [place].   6. Dementia - A&Ox2 during today's encounter. Most history is provided by the patient's son.    Medication Adjustments/Labs and Tests Ordered: Current medicines are  reviewed at length with the patient today.  Concerns regarding medicines are outlined above.  Medication changes, Labs and Tests ordered today are listed in the Patient Instructions below. Patient Instructions  Medication Instructions:  Your physician recommends that you continue on your current medications as directed. Please refer to the Current Medication list given to you today.  Lasix 40 mg Two Times Daily   If you need a refill on your cardiac medications before your next appointment, please call your pharmacy.   Lab work: Your physician recommends that you return for lab work in: Today   If you have labs (blood work) drawn today and your tests are completely normal, you will receive your results only by: Marland Kitchen MyChart Message (if you have MyChart) OR . A paper copy in the mail If you have any lab test that is abnormal or we need to change your treatment, we will call you to review the results.  Testing/Procedures: NONE   Follow-Up: At [facility], you and your health needs are our priority.  As part of our continuing mission to provide you with exceptional heart care, we have created designated Provider  Care Teams.  These Care Teams include your primary Cardiologist (physician) and Advanced Practice Providers (APPs -  Physician Assistants and Nurse Practitioners) who all work together to provide you with the care you need, when you need it. You will need a follow up appointment in 4-6  weeks.  Please call our office 2 months in advance to schedule this appointment.  You may see Carlyle Dolly, MD or one of the following Advanced Practice Providers on your designated Care Team:   Bernerd Pho, PA-C [facility]) . Ermalinda Barrios, PA-C ([facility])  Any Other Special Instructions Will Be Listed Below (If Applicable). Thank you for choosing [facility]!     Signed, Erma Heritage, PA-C  01/08/2018 8:28 PM    [place] [address] [place], [place] [address] Phone: [phone]

## 2018-01-09 ENCOUNTER — Other Ambulatory Visit: Payer: Self-pay | Admitting: Student

## 2018-01-09 MED ORDER — TORSEMIDE 20 MG PO TABS: 20.0000 mg | ORAL_TABLET | Freq: Two times a day (BID) | ORAL | 3 refills | Status: DC

## 2018-01-15 ENCOUNTER — Telehealth: Payer: Self-pay | Admitting: Student

## 2018-01-15 NOTE — Telephone Encounter (Signed)
Patient's son states that meds were changed approximately 5 days ago. D/c'd lasix and started on new medication. Since then patient has been experiencing continued swelling and decreased urination. States that as of couple days ago, patient's dementia has worsened. Also states that as of day patient started new meds, patient's blood sugars have went up from 150's to 250's. Patient's son would like return phone call. If patient's son is not available at number provided, plese contact patient's daughter Tye [place] @ [phone]. / tg

## 2018-01-15 NOTE — Telephone Encounter (Signed)
By recent labs, looks like torsemide was started. Son had left office already.

## 2018-01-16 MED ORDER — FUROSEMIDE 40 MG PO TABS: 40.0000 mg | ORAL_TABLET | Freq: Two times a day (BID) | ORAL | 3 refills | Status: DC

## 2018-01-16 NOTE — Telephone Encounter (Signed)
    Can stop Torsemide for now and switch back to Lasix 40mg  BID. Would also recommend she follow-up with PCP in regards to decreased urination and worsening mental status, as this would be concerning for a UTI.   If reaching out to patient's son, he had previously asked to be contacted at his work number [employer] [phone]).   Signed, Erma Heritage, PA-C 01/16/2018, 7:55 AM Pager: [phone]

## 2018-01-16 NOTE — Telephone Encounter (Signed)
I spoke with son [employer], they stopped her torsemide yesterday and have resumed lasix 40 mg BID.They have pcp apt on Monday 01/19/18 and will have a U/A done.

## 2018-01-16 NOTE — Telephone Encounter (Signed)
Attempt to reach [employer], line just rings, will try later

## 2018-01-19 DIAGNOSIS — E1121 Type 2 diabetes mellitus with diabetic nephropathy: Secondary | ICD-10-CM | POA: Diagnosis not present

## 2018-01-19 DIAGNOSIS — I10 Essential (primary) hypertension: Secondary | ICD-10-CM | POA: Diagnosis not present

## 2018-01-19 DIAGNOSIS — K210 Gastro-esophageal reflux disease with esophagitis: Secondary | ICD-10-CM | POA: Diagnosis not present

## 2018-02-07 DIAGNOSIS — E1122 Type 2 diabetes mellitus with diabetic chronic kidney disease: Secondary | ICD-10-CM | POA: Diagnosis present

## 2018-02-07 DIAGNOSIS — Z7952 Long term (current) use of systemic steroids: Secondary | ICD-10-CM | POA: Diagnosis not present

## 2018-02-07 DIAGNOSIS — Z7902 Long term (current) use of antithrombotics/antiplatelets: Secondary | ICD-10-CM | POA: Diagnosis not present

## 2018-02-07 DIAGNOSIS — F319 Bipolar disorder, unspecified: Secondary | ICD-10-CM | POA: Diagnosis present

## 2018-02-07 DIAGNOSIS — I5033 Acute on chronic diastolic (congestive) heart failure: Secondary | ICD-10-CM | POA: Diagnosis present

## 2018-02-07 DIAGNOSIS — I2510 Atherosclerotic heart disease of native coronary artery without angina pectoris: Secondary | ICD-10-CM | POA: Diagnosis present

## 2018-02-07 DIAGNOSIS — Z8673 Personal history of transient ischemic attack (TIA), and cerebral infarction without residual deficits: Secondary | ICD-10-CM | POA: Diagnosis not present

## 2018-02-07 DIAGNOSIS — G8929 Other chronic pain: Secondary | ICD-10-CM | POA: Diagnosis present

## 2018-02-07 DIAGNOSIS — Z794 Long term (current) use of insulin: Secondary | ICD-10-CM | POA: Diagnosis not present

## 2018-02-07 DIAGNOSIS — M545 Low back pain: Secondary | ICD-10-CM | POA: Diagnosis present

## 2018-02-07 DIAGNOSIS — Z955 Presence of coronary angioplasty implant and graft: Secondary | ICD-10-CM | POA: Diagnosis not present

## 2018-02-07 DIAGNOSIS — I130 Hypertensive heart and chronic kidney disease with heart failure and stage 1 through stage 4 chronic kidney disease, or unspecified chronic kidney disease: Secondary | ICD-10-CM | POA: Diagnosis present

## 2018-02-07 DIAGNOSIS — Z952 Presence of prosthetic heart valve: Secondary | ICD-10-CM | POA: Diagnosis not present

## 2018-02-07 DIAGNOSIS — N183 Chronic kidney disease, stage 3 (moderate): Secondary | ICD-10-CM | POA: Diagnosis present

## 2018-02-07 DIAGNOSIS — M353 Polymyalgia rheumatica: Secondary | ICD-10-CM | POA: Diagnosis present

## 2018-02-07 DIAGNOSIS — I509 Heart failure, unspecified: Secondary | ICD-10-CM | POA: Diagnosis not present

## 2018-02-07 DIAGNOSIS — R569 Unspecified convulsions: Secondary | ICD-10-CM | POA: Diagnosis present

## 2018-02-07 DIAGNOSIS — Z951 Presence of aortocoronary bypass graft: Secondary | ICD-10-CM | POA: Diagnosis not present

## 2018-02-07 DIAGNOSIS — Z79899 Other long term (current) drug therapy: Secondary | ICD-10-CM | POA: Diagnosis not present

## 2018-02-07 DIAGNOSIS — Z7982 Long term (current) use of aspirin: Secondary | ICD-10-CM | POA: Diagnosis not present

## 2018-02-07 DIAGNOSIS — R05 Cough: Secondary | ICD-10-CM | POA: Diagnosis not present

## 2018-02-07 DIAGNOSIS — I5032 Chronic diastolic (congestive) heart failure: Secondary | ICD-10-CM | POA: Diagnosis not present

## 2018-02-11 ENCOUNTER — Other Ambulatory Visit: Payer: Self-pay | Admitting: Student

## 2018-02-12 ENCOUNTER — Ambulatory Visit: Payer: Medicare Other | Admitting: Student

## 2018-02-12 DIAGNOSIS — I130 Hypertensive heart and chronic kidney disease with heart failure and stage 1 through stage 4 chronic kidney disease, or unspecified chronic kidney disease: Secondary | ICD-10-CM | POA: Diagnosis not present

## 2018-02-12 DIAGNOSIS — F319 Bipolar disorder, unspecified: Secondary | ICD-10-CM | POA: Diagnosis not present

## 2018-02-12 DIAGNOSIS — Z794 Long term (current) use of insulin: Secondary | ICD-10-CM | POA: Diagnosis not present

## 2018-02-12 DIAGNOSIS — Z87440 Personal history of urinary (tract) infections: Secondary | ICD-10-CM | POA: Diagnosis not present

## 2018-02-12 DIAGNOSIS — M545 Low back pain: Secondary | ICD-10-CM | POA: Diagnosis not present

## 2018-02-12 DIAGNOSIS — E1122 Type 2 diabetes mellitus with diabetic chronic kidney disease: Secondary | ICD-10-CM | POA: Diagnosis not present

## 2018-02-12 DIAGNOSIS — G40909 Epilepsy, unspecified, not intractable, without status epilepticus: Secondary | ICD-10-CM | POA: Diagnosis not present

## 2018-02-12 DIAGNOSIS — I69393 Ataxia following cerebral infarction: Secondary | ICD-10-CM | POA: Diagnosis not present

## 2018-02-12 DIAGNOSIS — Z7952 Long term (current) use of systemic steroids: Secondary | ICD-10-CM | POA: Diagnosis not present

## 2018-02-12 DIAGNOSIS — Z952 Presence of prosthetic heart valve: Secondary | ICD-10-CM | POA: Diagnosis not present

## 2018-02-12 DIAGNOSIS — H409 Unspecified glaucoma: Secondary | ICD-10-CM | POA: Diagnosis not present

## 2018-02-12 DIAGNOSIS — G4700 Insomnia, unspecified: Secondary | ICD-10-CM | POA: Diagnosis not present

## 2018-02-12 DIAGNOSIS — G8929 Other chronic pain: Secondary | ICD-10-CM | POA: Diagnosis not present

## 2018-02-12 DIAGNOSIS — I2510 Atherosclerotic heart disease of native coronary artery without angina pectoris: Secondary | ICD-10-CM | POA: Diagnosis not present

## 2018-02-12 DIAGNOSIS — E785 Hyperlipidemia, unspecified: Secondary | ICD-10-CM | POA: Diagnosis not present

## 2018-02-12 DIAGNOSIS — Z951 Presence of aortocoronary bypass graft: Secondary | ICD-10-CM | POA: Diagnosis not present

## 2018-02-12 DIAGNOSIS — R32 Unspecified urinary incontinence: Secondary | ICD-10-CM | POA: Diagnosis not present

## 2018-02-12 DIAGNOSIS — I69351 Hemiplegia and hemiparesis following cerebral infarction affecting right dominant side: Secondary | ICD-10-CM | POA: Diagnosis not present

## 2018-02-12 DIAGNOSIS — N183 Chronic kidney disease, stage 3 (moderate): Secondary | ICD-10-CM | POA: Diagnosis not present

## 2018-02-12 DIAGNOSIS — I5032 Chronic diastolic (congestive) heart failure: Secondary | ICD-10-CM | POA: Diagnosis not present

## 2018-02-12 DIAGNOSIS — Z9181 History of falling: Secondary | ICD-10-CM | POA: Diagnosis not present

## 2018-02-12 DIAGNOSIS — M353 Polymyalgia rheumatica: Secondary | ICD-10-CM | POA: Diagnosis not present

## 2018-02-12 NOTE — Progress Notes (Deleted)
Cardiology Office Note    Date:  02/12/2018   ID:  Sharon Hubbard, DOB 1933/08/11, MRN [id number]  PCP:  Neale Burly, MD  Cardiologist: Carlyle Dolly, MD    No chief complaint on file.   History of Present Illness:    Sharon Hubbard is a 85 y.o. female with past medical history of CAD (s/p DES to mid-LAD in 2012 and 2015, CABG with RIMA-RCA in 08/2014, cath in 10/2016 showing patent proximal LAD stents and known CTO of mid-RCA with dRCA filling from collaterals and patent RIMA-RCA), severe AS (s/p AVR with pericardial tissue valve in 08/2014), chronic diastolic CHF, HTN, HLD,dementiaand Stage 3 CKD who presents to the office today for 4-week follow-up.   She was last examined by myself on 01/08/2018 and had experienced a 10 pound weight gain within the past several months.  Her son had self titrated her Lasix to 40 mg twice daily with minimal improvement in her symptoms.  Labs at that time showed her BNP was elevated to 232 kidney function remains stable with creatinine at 1.47.  iven that her symptoms had not improved with dose titration of Lasix, I recommended they switch to Torsemide 20 mg twice daily and follow daily weights.  The patient's son called the office several days later and reported continued swelling and decreased urination since starting on torsemide and she had also experienced worsening confusion.  Therefore, she was switch back to Lasix 40 mg twice daily and was encouraged to follow-up with her PCP in regards to worsening mental status and dysuria due to concerns for a possible UTI.  Past Medical History:  Diagnosis Date  . Anemia   . Aortic stenosis, severe 08/2014   a. s/p AVR with 21 mm [facility] Ease pericardial tissue valve   . Blood transfusion   . Bradycardia, sinus   . CAD (coronary artery disease)    a. s/p DES to mid-LAD in 2012 and 2015 b. CABG with RIMA-RCA in 08/2014 at the time of AVR c. 10/2016: cath showing patent RIMA-RCA and  patent stents along LAD with mild-nonobstructive disease along LCx.   . Cancer ([place])    ""had hysterectomy for a 6 showing cancer; think that's pretty strong  . Complication of anesthesia    "felt like I was smothering once with mask on my face"  . Diabetes mellitus   . Dyslipidemia   . Ejection fraction    EF 65%, echo, April, 2012, aortic valve sclerosis with very slight gradient  . Femoral bruit    01/2011 hosp, but no pseudoaneurysm or AV fistula  . GERD (gastroesophageal reflux disease)   . Groin hematoma    April, 2011  . Hypertension   . Myocardial infarction [facility]) 2011; 08/2010  . Osteoarthritis    arthroscopic surgery right knee February, 2012  . Pancreas cyst    The patient has multiple pancreatic cysts.  This is followed at [facility]         . Polymyalgia rheumatica ([place])   . Preoperative evaluation to rule out surgical contraindication    Patient needs back surgery by Dr. Carloyn Manner, May, 2012                . RBBB (right bundle branch block)    old  . Renal insufficiency   . Schatzki's ring   . Shortness of breath on exertion   . Spinal stenosis    history of surgery for this  . Stroke [facility]) 01/18/11   "  I've had 2 TIA's"    Past Surgical History:  Procedure Laterality Date  . ABDOMINAL HYSTERECTOMY  ~ 1974  . AORTIC VALVE REPLACEMENT N/A 08/22/2014   Procedure: AORTIC VALVE REPLACEMENT (AVR);  Surgeon: Melrose Nakayama, MD;  Location: [place];  Service: Open Heart Surgery;  Laterality: N/A;  . APPENDECTOMY    . BACK SURGERY    . CARDIAC CATHETERIZATION  01/18/11  . CARDIAC CATHETERIZATION N/A 08/16/2014   Procedure: Left Heart Cath and Coronary Angiography;  Surgeon: Leonie Man, MD;  Location: [place] CV LAB;  Service: Cardiovascular;  Laterality: N/A;  . CARDIAC CATHETERIZATION N/A 08/16/2014   Procedure: Coronary Stent Intervention;  Surgeon: Leonie Man, MD;  Location: [place] CV LAB;  Service: Cardiovascular;  Laterality: N/A;  . CARDIAC  CATHETERIZATION N/A 08/17/2014   Procedure: Coronary/Graft Atherectomy;  Surgeon: Burnell Blanks, MD;  Location: [place] CV LAB;  Service: Cardiovascular;  Laterality: N/A;  . CHOLECYSTECTOMY  1987  . CORONARY ANGIOGRAM  01/18/2011   Procedure: CORONARY ANGIOGRAM;  Surgeon: Larey Dresser, MD;  Location: [facility] CATH LAB;  Service: Cardiovascular;;  . CORONARY ANGIOPLASTY WITH STENT PLACEMENT  2011   "in [place]"  . CORONARY ANGIOPLASTY WITH STENT PLACEMENT  08/2010; 03/25/2013  . CORONARY ARTERY BYPASS GRAFT N/A 08/22/2014   Procedure: CORONARY ARTERY BYPASS GRAFTING (CABG), ON PUMP, TIMES ONE, USING RIGHT INTERNAL MAMMARY ARTERY;  Surgeon: Melrose Nakayama, MD;  Location: [place];  Service: Open Heart Surgery;  Laterality: N/A;  . DILATION AND CURETTAGE OF UTERUS    . FEMORAL ARTERY EXPLORATION Right 03/26/2013   Procedure: FEMORAL ARTERY EXPLORATION WITH SUTURE REPAIR; EVACUATION OF HEMATOMA;  Surgeon: Mal Misty, MD;  Location: [place];  Service: Vascular;  Laterality: Right;  . KNEE ARTHROSCOPY  02/2010   right knee; chrondoplasty medial femoral condyle;  Partial medial meniscectomy.   Marland Kitchen LEFT AND RIGHT HEART CATHETERIZATION WITH CORONARY ANGIOGRAM N/A 03/25/2013   Procedure: LEFT AND RIGHT HEART CATHETERIZATION WITH CORONARY ANGIOGRAM;  Surgeon: Burnell Blanks, MD;  Location: [facility] CATH LAB;  Service: Cardiovascular;  Laterality: N/A;  . LEFT HEART CATH AND CORS/GRAFTS ANGIOGRAPHY N/A 11/18/2016   Procedure: LEFT HEART CATH AND CORS/GRAFTS ANGIOGRAPHY;  Surgeon: Burnell Blanks, MD;  Location: [place] CV LAB;  Service: Cardiovascular;  Laterality: N/A;  . LUMBAR SPINE SURGERY  ~ 1977; ~ 2010   "2 hugh ruptured discs; I was paralyzed first OR; 2nd OR by Dr. Carloyn Manner, don't know what for"  . TEE WITHOUT CARDIOVERSION N/A 08/22/2014   Procedure: TRANSESOPHAGEAL ECHOCARDIOGRAM (TEE);  Surgeon: Melrose Nakayama, MD;  Location: [place];  Service: Open Heart Surgery;  Laterality:  N/A;  . TUBAL LIGATION  1970's    Current Medications: Outpatient Medications Prior to Visit  Medication Sig Dispense Refill  . amLODipine (NORVASC) 10 MG tablet Take 10 mg by mouth daily.      Marland Kitchen aspirin 81 MG tablet Take 81 mg by mouth daily.      . Cholecalciferol (VITAMIN D-3) 1000 units CAPS Take 1 capsule by mouth daily.    . clopidogrel (PLAVIX) 75 MG tablet TAKE 1 TABLET BY MOUTH ONCE DAILY WITH BREAKFAST 90 tablet 2  . divalproex (DEPAKOTE) 500 MG 24 hr tablet Take 500 mg by mouth daily.      . furosemide (LASIX) 40 MG tablet Take 1 tablet (40 mg total) by mouth 2 (two) times daily. 180 tablet 3  . glimepiride (AMARYL) 4 MG tablet Take 4  mg by mouth daily.  0  . HYDROcodone-acetaminophen (NORCO/VICODIN) 5-325 MG tablet Take 1 tablet every 6 hours as needed    . Insulin Glargine (LANTUS SOLOSTAR) 100 UNIT/ML Solostar Pen Inject 16 Units into the skin every morning.     . insulin lispro (HUMALOG KWIKPEN) 100 UNIT/ML KiwkPen Inject 7 Units into the skin 3 (three) times daily before meals.     . isosorbide mononitrate (IMDUR) 60 MG 24 hr tablet Take 1.5 tablets (90 mg total) daily by mouth. 45 tablet 11  . latanoprost (XALATAN) 0.005 % ophthalmic solution Place 1 drop into both eyes at bedtime.      Marland Kitchen lisinopril (PRINIVIL,ZESTRIL) 20 MG tablet Take 20 mg by mouth.    . pantoprazole (PROTONIX) 40 MG tablet Take 40 mg by mouth daily.    . predniSONE (DELTASONE) 5 MG tablet Take 5 mg by mouth daily. as directed  0  . rosuvastatin (CRESTOR) 20 MG tablet Take 20 mg by mouth daily.    . timolol (TIMOPTIC) 0.5 % ophthalmic solution Place 1 drop into both eyes daily.      No facility-administered medications prior to visit.      Allergies:   Clarithromycin; Codeine; Fentanyl; Morphine; Statins; Sulfa antibiotics; and Atorvastatin   Social History   Socioeconomic History  . Marital status: Married    Spouse name: Not on file  . Number of children: Not on file  . Years of education: Not  on file  . Highest education level: Not on file  Occupational History  . Not on file  Social Needs  . Financial resource strain: Not on file  . Food insecurity:    Worry: Not on file    Inability: Not on file  . Transportation needs:    Medical: Not on file    Non-medical: Not on file  Tobacco Use  . Smoking status: Never Smoker  . Smokeless tobacco: Never Used  Substance and Sexual Activity  . Alcohol use: No    Alcohol/week: 0.0 standard drinks  . Drug use: No  . Sexual activity: Never    Birth control/protection: Post-menopausal  Lifestyle  . Physical activity:    Days per week: Not on file    Minutes per session: Not on file  . Stress: Not on file  Relationships  . Social connections:    Talks on phone: Not on file    Gets together: Not on file    Attends religious service: Not on file    Active member of club or organization: Not on file    Attends meetings of clubs or organizations: Not on file    Relationship status: Not on file  Other Topics Concern  . Not on file  Social History Narrative  . Not on file     Family History:  The patient's ***family history includes Other (age of onset: 6) in her father; Uterine cancer (age of onset: 38) in her mother.   Review of Systems:   Please see the history of present illness.     General:  No chills, fever, night sweats or weight changes.  Cardiovascular:  No chest pain, dyspnea on exertion, edema, orthopnea, palpitations, paroxysmal nocturnal dyspnea. Dermatological: No rash, lesions/masses Respiratory: No cough, dyspnea Urologic: No hematuria, dysuria Abdominal:   No nausea, vomiting, diarrhea, bright red blood per rectum, melena, or hematemesis Neurologic:  No visual changes, wkns, changes in mental status. All other systems reviewed and are otherwise negative except as noted above.   Physical Exam:  VS:  There were no vitals taken for this visit.   General: Well developed, well nourished,female appearing  in no acute distress. Head: Normocephalic, atraumatic, sclera non-icteric, no xanthomas, nares are without discharge.  Neck: No carotid bruits. JVD not elevated.  Lungs: Respirations regular and unlabored, without wheezes or rales.  Heart: ***Regular rate and rhythm. No S3 or S4.  No murmur, no rubs, or gallops appreciated. Abdomen: Soft, non-tender, non-distended with normoactive bowel sounds. No hepatomegaly. No rebound/guarding. No obvious abdominal masses. Msk:  Strength and tone appear normal for age. No joint deformities or effusions. Extremities: No clubbing or cyanosis. No edema.  Distal pedal pulses are 2+ bilaterally. Neuro: Alert and oriented X 3. Moves all extremities spontaneously. No focal deficits noted. Psych:  Responds to questions appropriately with a normal affect. Skin: No rashes or lesions noted  Wt Readings from Last 3 Encounters:  01/08/18 159 lb (72.1 kg)  10/28/17 152 lb (68.9 kg)  12/05/16 143 lb (64.9 kg)        Studies/Labs Reviewed:   EKG:  EKG is*** ordered today.  The ekg ordered today demonstrates ***  Recent Labs: 01/08/2018: B Natriuretic Peptide 232.0; BUN 31; Creatinine, Ser 1.47; Potassium 4.2; Sodium 139   Lipid Panel    Component Value Date/Time   CHOL 138 11/17/2016 0226   TRIG 63 11/17/2016 0226   HDL 79 11/17/2016 0226   CHOLHDL 1.7 11/17/2016 0226   VLDL 13 11/17/2016 0226   LDLCALC 46 11/17/2016 0226    Additional studies/ records that were reviewed today include:   Echocardiogram: 10/2016 Study Conclusions  - Left ventricle: The cavity size was normal. There was mild focal   basal hypertrophy of the septum. Systolic function was normal.   The estimated ejection fraction was in the range of 55% to 60%.   Wall motion was normal; there were no regional wall motion   abnormalities. Doppler parameters are consistent with abnormal   left ventricular relaxation (grade 1 diastolic dysfunction). - Aortic valve: A prosthesis was  present and functioning normally.   The prosthesis had a normal range of motion. The sewing ring   appeared normal, had no rocking motion, and showed no evidence of   dehiscence. Peak velocity (S): 211 cm/s. Mean gradient (S): 11 mm   Hg. Valve area (VTI): 0.89 cm^2. Valve area (Vmax): 0.97 cm^2.   Valve area (Vmean): 0.85 cm^2. - Mitral valve: Severely calcified annulus. Mildly thickened   leaflets . There was trivial regurgitation. - Left atrium: The atrium was mildly dilated. Volume/bsa, ES   (1-plane Simpson&'s, A4C): 39 ml/m^2.  Impressions:  - Compared to the prior study, there has been no significant   interval change.  Cardiac Catheterization: 10/2016  Mid RCA lesion, 100 %stenosed.  RIMA graft was visualized by angiography.  Prox Cx to Dist Cx lesion, 20 %stenosed.  1st Mrg lesion, 40 %stenosed.  Ost LAD to Mid LAD lesion, 10 %stenosed.  Mid LAD to Dist LAD lesion, 30 %stenosed.   1. Chronic total occlusion mid RCA. The distal RCA fills from left to right collaterals. The free RIMA to the RCA is patent to a small branch.  2. Patent proximal and mid LAD stents with minimal restenosis.  3. Mild non-obstructive disease in the Circumflex artery 4. NSTEMI with diffuse underlying CAD. Pt was hypertensive on arrival to cath lab (220/110). Possible demand ischemia given uncontrolled HTN.   Recommendations: Medical management of CAD. Continue ASA and Plavix. Will continue hydration post cath for 8  hours. I would monitor x 24 more hours to follow BP and adjust BP medications.    Assessment:    No diagnosis found.   Plan:   In order of problems listed above:  1. ***    Medication Adjustments/Labs and Tests Ordered: Current medicines are reviewed at length with the patient today.  Concerns regarding medicines are outlined above.  Medication changes, Labs and Tests ordered today are listed in the Patient Instructions below. There are no Patient Instructions on file  for this visit.   Signed, Erma Heritage, PA-C  02/12/2018 7:38 AM    [place] [address] [place], [place] [address] Phone: [phone] Fax: [phone]

## 2018-02-13 ENCOUNTER — Encounter: Payer: Self-pay | Admitting: Student

## 2018-02-16 DIAGNOSIS — I5032 Chronic diastolic (congestive) heart failure: Secondary | ICD-10-CM | POA: Diagnosis not present

## 2018-02-16 DIAGNOSIS — N183 Chronic kidney disease, stage 3 (moderate): Secondary | ICD-10-CM | POA: Diagnosis not present

## 2018-02-16 DIAGNOSIS — I69393 Ataxia following cerebral infarction: Secondary | ICD-10-CM | POA: Diagnosis not present

## 2018-02-16 DIAGNOSIS — E1122 Type 2 diabetes mellitus with diabetic chronic kidney disease: Secondary | ICD-10-CM | POA: Diagnosis not present

## 2018-02-16 DIAGNOSIS — I130 Hypertensive heart and chronic kidney disease with heart failure and stage 1 through stage 4 chronic kidney disease, or unspecified chronic kidney disease: Secondary | ICD-10-CM | POA: Diagnosis not present

## 2018-02-16 DIAGNOSIS — I69351 Hemiplegia and hemiparesis following cerebral infarction affecting right dominant side: Secondary | ICD-10-CM | POA: Diagnosis not present

## 2018-02-17 DIAGNOSIS — I5032 Chronic diastolic (congestive) heart failure: Secondary | ICD-10-CM | POA: Diagnosis not present

## 2018-02-18 DIAGNOSIS — I69351 Hemiplegia and hemiparesis following cerebral infarction affecting right dominant side: Secondary | ICD-10-CM | POA: Diagnosis not present

## 2018-02-18 DIAGNOSIS — I5032 Chronic diastolic (congestive) heart failure: Secondary | ICD-10-CM | POA: Diagnosis not present

## 2018-02-18 DIAGNOSIS — I69393 Ataxia following cerebral infarction: Secondary | ICD-10-CM | POA: Diagnosis not present

## 2018-02-18 DIAGNOSIS — E1122 Type 2 diabetes mellitus with diabetic chronic kidney disease: Secondary | ICD-10-CM | POA: Diagnosis not present

## 2018-02-18 DIAGNOSIS — I130 Hypertensive heart and chronic kidney disease with heart failure and stage 1 through stage 4 chronic kidney disease, or unspecified chronic kidney disease: Secondary | ICD-10-CM | POA: Diagnosis not present

## 2018-02-18 DIAGNOSIS — N183 Chronic kidney disease, stage 3 (moderate): Secondary | ICD-10-CM | POA: Diagnosis not present

## 2018-02-19 DIAGNOSIS — I5032 Chronic diastolic (congestive) heart failure: Secondary | ICD-10-CM | POA: Diagnosis not present

## 2018-02-19 DIAGNOSIS — I69351 Hemiplegia and hemiparesis following cerebral infarction affecting right dominant side: Secondary | ICD-10-CM | POA: Diagnosis not present

## 2018-02-19 DIAGNOSIS — N183 Chronic kidney disease, stage 3 (moderate): Secondary | ICD-10-CM | POA: Diagnosis not present

## 2018-02-19 DIAGNOSIS — I130 Hypertensive heart and chronic kidney disease with heart failure and stage 1 through stage 4 chronic kidney disease, or unspecified chronic kidney disease: Secondary | ICD-10-CM | POA: Diagnosis not present

## 2018-02-19 DIAGNOSIS — E1122 Type 2 diabetes mellitus with diabetic chronic kidney disease: Secondary | ICD-10-CM | POA: Diagnosis not present

## 2018-02-19 DIAGNOSIS — I69393 Ataxia following cerebral infarction: Secondary | ICD-10-CM | POA: Diagnosis not present

## 2018-02-23 DIAGNOSIS — N183 Chronic kidney disease, stage 3 (moderate): Secondary | ICD-10-CM | POA: Diagnosis not present

## 2018-02-23 DIAGNOSIS — I69351 Hemiplegia and hemiparesis following cerebral infarction affecting right dominant side: Secondary | ICD-10-CM | POA: Diagnosis not present

## 2018-02-23 DIAGNOSIS — I69393 Ataxia following cerebral infarction: Secondary | ICD-10-CM | POA: Diagnosis not present

## 2018-02-23 DIAGNOSIS — I5032 Chronic diastolic (congestive) heart failure: Secondary | ICD-10-CM | POA: Diagnosis not present

## 2018-02-23 DIAGNOSIS — E1122 Type 2 diabetes mellitus with diabetic chronic kidney disease: Secondary | ICD-10-CM | POA: Diagnosis not present

## 2018-02-23 DIAGNOSIS — I130 Hypertensive heart and chronic kidney disease with heart failure and stage 1 through stage 4 chronic kidney disease, or unspecified chronic kidney disease: Secondary | ICD-10-CM | POA: Diagnosis not present

## 2018-02-24 DIAGNOSIS — E1122 Type 2 diabetes mellitus with diabetic chronic kidney disease: Secondary | ICD-10-CM | POA: Diagnosis not present

## 2018-02-24 DIAGNOSIS — I5032 Chronic diastolic (congestive) heart failure: Secondary | ICD-10-CM | POA: Diagnosis not present

## 2018-02-24 DIAGNOSIS — N183 Chronic kidney disease, stage 3 (moderate): Secondary | ICD-10-CM | POA: Diagnosis not present

## 2018-02-24 DIAGNOSIS — I69351 Hemiplegia and hemiparesis following cerebral infarction affecting right dominant side: Secondary | ICD-10-CM | POA: Diagnosis not present

## 2018-02-24 DIAGNOSIS — I130 Hypertensive heart and chronic kidney disease with heart failure and stage 1 through stage 4 chronic kidney disease, or unspecified chronic kidney disease: Secondary | ICD-10-CM | POA: Diagnosis not present

## 2018-02-24 DIAGNOSIS — I69393 Ataxia following cerebral infarction: Secondary | ICD-10-CM | POA: Diagnosis not present

## 2018-02-25 DIAGNOSIS — N183 Chronic kidney disease, stage 3 (moderate): Secondary | ICD-10-CM | POA: Diagnosis not present

## 2018-02-25 DIAGNOSIS — E1122 Type 2 diabetes mellitus with diabetic chronic kidney disease: Secondary | ICD-10-CM | POA: Diagnosis not present

## 2018-02-25 DIAGNOSIS — I5032 Chronic diastolic (congestive) heart failure: Secondary | ICD-10-CM | POA: Diagnosis not present

## 2018-02-25 DIAGNOSIS — I69393 Ataxia following cerebral infarction: Secondary | ICD-10-CM | POA: Diagnosis not present

## 2018-02-25 DIAGNOSIS — I130 Hypertensive heart and chronic kidney disease with heart failure and stage 1 through stage 4 chronic kidney disease, or unspecified chronic kidney disease: Secondary | ICD-10-CM | POA: Diagnosis not present

## 2018-02-25 DIAGNOSIS — I69351 Hemiplegia and hemiparesis following cerebral infarction affecting right dominant side: Secondary | ICD-10-CM | POA: Diagnosis not present

## 2018-03-02 DIAGNOSIS — I130 Hypertensive heart and chronic kidney disease with heart failure and stage 1 through stage 4 chronic kidney disease, or unspecified chronic kidney disease: Secondary | ICD-10-CM | POA: Diagnosis not present

## 2018-03-02 DIAGNOSIS — R4182 Altered mental status, unspecified: Secondary | ICD-10-CM | POA: Diagnosis not present

## 2018-03-02 DIAGNOSIS — N183 Chronic kidney disease, stage 3 (moderate): Secondary | ICD-10-CM | POA: Diagnosis not present

## 2018-03-02 DIAGNOSIS — N390 Urinary tract infection, site not specified: Secondary | ICD-10-CM | POA: Diagnosis not present

## 2018-03-02 DIAGNOSIS — I5032 Chronic diastolic (congestive) heart failure: Secondary | ICD-10-CM | POA: Diagnosis not present

## 2018-03-02 DIAGNOSIS — I69351 Hemiplegia and hemiparesis following cerebral infarction affecting right dominant side: Secondary | ICD-10-CM | POA: Diagnosis not present

## 2018-03-02 DIAGNOSIS — E1122 Type 2 diabetes mellitus with diabetic chronic kidney disease: Secondary | ICD-10-CM | POA: Diagnosis not present

## 2018-03-02 DIAGNOSIS — I69393 Ataxia following cerebral infarction: Secondary | ICD-10-CM | POA: Diagnosis not present

## 2018-03-05 DIAGNOSIS — N183 Chronic kidney disease, stage 3 (moderate): Secondary | ICD-10-CM | POA: Diagnosis not present

## 2018-03-05 DIAGNOSIS — I69393 Ataxia following cerebral infarction: Secondary | ICD-10-CM | POA: Diagnosis not present

## 2018-03-05 DIAGNOSIS — I69351 Hemiplegia and hemiparesis following cerebral infarction affecting right dominant side: Secondary | ICD-10-CM | POA: Diagnosis not present

## 2018-03-05 DIAGNOSIS — I5032 Chronic diastolic (congestive) heart failure: Secondary | ICD-10-CM | POA: Diagnosis not present

## 2018-03-05 DIAGNOSIS — I130 Hypertensive heart and chronic kidney disease with heart failure and stage 1 through stage 4 chronic kidney disease, or unspecified chronic kidney disease: Secondary | ICD-10-CM | POA: Diagnosis not present

## 2018-03-05 DIAGNOSIS — E1122 Type 2 diabetes mellitus with diabetic chronic kidney disease: Secondary | ICD-10-CM | POA: Diagnosis not present

## 2018-04-06 DIAGNOSIS — M47816 Spondylosis without myelopathy or radiculopathy, lumbar region: Secondary | ICD-10-CM | POA: Diagnosis not present

## 2018-04-20 DIAGNOSIS — I10 Essential (primary) hypertension: Secondary | ICD-10-CM | POA: Diagnosis not present

## 2018-04-20 DIAGNOSIS — K210 Gastro-esophageal reflux disease with esophagitis: Secondary | ICD-10-CM | POA: Diagnosis not present

## 2018-04-20 DIAGNOSIS — I5032 Chronic diastolic (congestive) heart failure: Secondary | ICD-10-CM | POA: Diagnosis not present

## 2018-04-20 DIAGNOSIS — E7849 Other hyperlipidemia: Secondary | ICD-10-CM | POA: Diagnosis not present

## 2018-04-20 DIAGNOSIS — E1165 Type 2 diabetes mellitus with hyperglycemia: Secondary | ICD-10-CM | POA: Diagnosis not present

## 2018-08-10 DIAGNOSIS — E7849 Other hyperlipidemia: Secondary | ICD-10-CM | POA: Diagnosis not present

## 2018-08-10 DIAGNOSIS — I5032 Chronic diastolic (congestive) heart failure: Secondary | ICD-10-CM | POA: Diagnosis not present

## 2018-08-10 DIAGNOSIS — E1165 Type 2 diabetes mellitus with hyperglycemia: Secondary | ICD-10-CM | POA: Diagnosis not present

## 2018-08-10 DIAGNOSIS — I10 Essential (primary) hypertension: Secondary | ICD-10-CM | POA: Diagnosis not present

## 2018-08-10 DIAGNOSIS — K210 Gastro-esophageal reflux disease with esophagitis: Secondary | ICD-10-CM | POA: Diagnosis not present

## 2018-08-14 DIAGNOSIS — E1165 Type 2 diabetes mellitus with hyperglycemia: Secondary | ICD-10-CM | POA: Diagnosis not present

## 2018-08-14 DIAGNOSIS — I10 Essential (primary) hypertension: Secondary | ICD-10-CM | POA: Diagnosis not present

## 2018-08-14 DIAGNOSIS — E7849 Other hyperlipidemia: Secondary | ICD-10-CM | POA: Diagnosis not present

## 2018-08-20 ENCOUNTER — Other Ambulatory Visit: Payer: Self-pay

## 2018-11-10 DIAGNOSIS — K219 Gastro-esophageal reflux disease without esophagitis: Secondary | ICD-10-CM | POA: Diagnosis not present

## 2018-11-10 DIAGNOSIS — C4441 Basal cell carcinoma of skin of scalp and neck: Secondary | ICD-10-CM | POA: Diagnosis not present

## 2018-11-10 DIAGNOSIS — E7849 Other hyperlipidemia: Secondary | ICD-10-CM | POA: Diagnosis not present

## 2018-11-10 DIAGNOSIS — I10 Essential (primary) hypertension: Secondary | ICD-10-CM | POA: Diagnosis not present

## 2018-11-10 DIAGNOSIS — C4481 Basal cell carcinoma of overlapping sites of skin: Secondary | ICD-10-CM | POA: Diagnosis not present

## 2018-11-10 DIAGNOSIS — Z1389 Encounter for screening for other disorder: Secondary | ICD-10-CM | POA: Diagnosis not present

## 2018-11-10 DIAGNOSIS — I5032 Chronic diastolic (congestive) heart failure: Secondary | ICD-10-CM | POA: Diagnosis not present

## 2018-11-10 DIAGNOSIS — E1121 Type 2 diabetes mellitus with diabetic nephropathy: Secondary | ICD-10-CM | POA: Diagnosis not present

## 2018-11-10 DIAGNOSIS — Z0000 Encounter for general adult medical examination without abnormal findings: Secondary | ICD-10-CM | POA: Diagnosis not present

## 2018-12-07 DIAGNOSIS — E1122 Type 2 diabetes mellitus with diabetic chronic kidney disease: Secondary | ICD-10-CM | POA: Diagnosis present

## 2018-12-07 DIAGNOSIS — Z8541 Personal history of malignant neoplasm of cervix uteri: Secondary | ICD-10-CM | POA: Diagnosis not present

## 2018-12-07 DIAGNOSIS — K921 Melena: Secondary | ICD-10-CM | POA: Diagnosis not present

## 2018-12-07 DIAGNOSIS — G43909 Migraine, unspecified, not intractable, without status migrainosus: Secondary | ICD-10-CM | POA: Diagnosis present

## 2018-12-07 DIAGNOSIS — Z794 Long term (current) use of insulin: Secondary | ICD-10-CM | POA: Diagnosis not present

## 2018-12-07 DIAGNOSIS — M353 Polymyalgia rheumatica: Secondary | ICD-10-CM | POA: Diagnosis present

## 2018-12-07 DIAGNOSIS — K641 Second degree hemorrhoids: Secondary | ICD-10-CM | POA: Diagnosis present

## 2018-12-07 DIAGNOSIS — I5032 Chronic diastolic (congestive) heart failure: Secondary | ICD-10-CM | POA: Diagnosis present

## 2018-12-07 DIAGNOSIS — I130 Hypertensive heart and chronic kidney disease with heart failure and stage 1 through stage 4 chronic kidney disease, or unspecified chronic kidney disease: Secondary | ICD-10-CM | POA: Diagnosis present

## 2018-12-07 DIAGNOSIS — K922 Gastrointestinal hemorrhage, unspecified: Secondary | ICD-10-CM | POA: Diagnosis not present

## 2018-12-07 DIAGNOSIS — H9191 Unspecified hearing loss, right ear: Secondary | ICD-10-CM | POA: Diagnosis present

## 2018-12-07 DIAGNOSIS — Z7982 Long term (current) use of aspirin: Secondary | ICD-10-CM | POA: Diagnosis not present

## 2018-12-07 DIAGNOSIS — F0390 Unspecified dementia without behavioral disturbance: Secondary | ICD-10-CM | POA: Diagnosis present

## 2018-12-07 DIAGNOSIS — Z952 Presence of prosthetic heart valve: Secondary | ICD-10-CM | POA: Diagnosis not present

## 2018-12-07 DIAGNOSIS — Z20828 Contact with and (suspected) exposure to other viral communicable diseases: Secondary | ICD-10-CM | POA: Diagnosis present

## 2018-12-07 DIAGNOSIS — N183 Chronic kidney disease, stage 3 unspecified: Secondary | ICD-10-CM | POA: Diagnosis not present

## 2018-12-07 DIAGNOSIS — K2951 Unspecified chronic gastritis with bleeding: Secondary | ICD-10-CM | POA: Diagnosis present

## 2018-12-07 DIAGNOSIS — D649 Anemia, unspecified: Secondary | ICD-10-CM | POA: Diagnosis not present

## 2018-12-07 DIAGNOSIS — D509 Iron deficiency anemia, unspecified: Secondary | ICD-10-CM | POA: Diagnosis present

## 2018-12-07 DIAGNOSIS — K635 Polyp of colon: Secondary | ICD-10-CM | POA: Diagnosis present

## 2018-12-07 DIAGNOSIS — Z8673 Personal history of transient ischemic attack (TIA), and cerebral infarction without residual deficits: Secondary | ICD-10-CM | POA: Diagnosis not present

## 2018-12-07 DIAGNOSIS — K5730 Diverticulosis of large intestine without perforation or abscess without bleeding: Secondary | ICD-10-CM | POA: Diagnosis not present

## 2018-12-07 DIAGNOSIS — K449 Diaphragmatic hernia without obstruction or gangrene: Secondary | ICD-10-CM | POA: Diagnosis present

## 2018-12-07 DIAGNOSIS — M545 Low back pain: Secondary | ICD-10-CM | POA: Diagnosis present

## 2018-12-07 DIAGNOSIS — I2510 Atherosclerotic heart disease of native coronary artery without angina pectoris: Secondary | ICD-10-CM | POA: Diagnosis present

## 2018-12-07 DIAGNOSIS — K625 Hemorrhage of anus and rectum: Secondary | ICD-10-CM | POA: Diagnosis not present

## 2018-12-07 DIAGNOSIS — Z7952 Long term (current) use of systemic steroids: Secondary | ICD-10-CM | POA: Diagnosis not present

## 2018-12-07 DIAGNOSIS — G8929 Other chronic pain: Secondary | ICD-10-CM | POA: Diagnosis present

## 2018-12-07 DIAGNOSIS — Z7902 Long term (current) use of antithrombotics/antiplatelets: Secondary | ICD-10-CM | POA: Diagnosis not present

## 2018-12-15 ENCOUNTER — Telehealth: Payer: Self-pay | Admitting: Cardiology

## 2018-12-15 NOTE — Telephone Encounter (Signed)
I asked daughter to re-check BP, 169/74. Patient is calm and relaxed now.daughter is going to make a f/u apt and continue to monitor her pressures.

## 2018-12-15 NOTE — Telephone Encounter (Signed)
Please give pt's son a call-- recevied phone call from pt's daughter Lorie Apley stating pt was admitted at [facility] due to rectal bleed and was admitted for 5 days. Her blood pressure has  been running really high, this evening it was around 222/100

## 2018-12-22 DIAGNOSIS — M545 Low back pain: Secondary | ICD-10-CM | POA: Diagnosis not present

## 2018-12-22 DIAGNOSIS — K921 Melena: Secondary | ICD-10-CM | POA: Diagnosis not present

## 2018-12-31 ENCOUNTER — Encounter: Payer: Self-pay | Admitting: Student

## 2018-12-31 ENCOUNTER — Other Ambulatory Visit: Payer: Self-pay

## 2018-12-31 ENCOUNTER — Ambulatory Visit (INDEPENDENT_AMBULATORY_CARE_PROVIDER_SITE_OTHER): Payer: Medicare Other | Admitting: Student

## 2018-12-31 VITALS — BP 149/67 | HR 54 | Ht 62.0 in | Wt 159.0 lb

## 2018-12-31 DIAGNOSIS — I10 Essential (primary) hypertension: Secondary | ICD-10-CM

## 2018-12-31 DIAGNOSIS — I2510 Atherosclerotic heart disease of native coronary artery without angina pectoris: Secondary | ICD-10-CM

## 2018-12-31 DIAGNOSIS — I5032 Chronic diastolic (congestive) heart failure: Secondary | ICD-10-CM | POA: Diagnosis not present

## 2018-12-31 DIAGNOSIS — N183 Chronic kidney disease, stage 3 unspecified: Secondary | ICD-10-CM

## 2018-12-31 DIAGNOSIS — I350 Nonrheumatic aortic (valve) stenosis: Secondary | ICD-10-CM

## 2018-12-31 MED ORDER — ASPIRIN EC 81 MG PO TBEC: 81.0000 mg | DELAYED_RELEASE_TABLET | Freq: Every day | ORAL | 3 refills | Status: AC

## 2018-12-31 NOTE — Progress Notes (Signed)
Cardiology Office Note    Date:  12/31/2018   ID:  SHAMIR KLIM, DOB 1933-10-23, MRN [id number]  PCP:  Neale Burly, MD  Cardiologist: Carlyle Dolly, MD    Chief Complaint  Patient presents with  . Follow-up    Annual Visit    History of Present Illness:    Sharon Hubbard is a 85 y.o. female with past medical history of CAD (s/p DES to mid-LAD in 2012 and 2015, CABG with RIMA-RCA in 08/2014, cath in 10/2016 showing patent proximal LAD stents and known CTO of mid-RCA with dRCA filling from collaterals and patent RIMA-RCA), chronic diastolic CHF, severe AS (s/p pericardial tissue AVR in 08/2014), HTN, HLD, Stage 3 CKD and dementia who presents to the office today for annual follow-up.  She was last examined myself in 12/2017 and reported breathing had overall been stable but she had experienced worsening lower extremity edema and her son had titrated her Lasix to 40 mg twice daily for the past several weeks with minimal improvement in her symptoms. BNP was checked at the time of her visit and elevated to 232, therefore it was recommended they try Torsemide 20 mg twice daily instead of Lasix to see if symptoms improved. They called the office several days later saying her swelling had worsened with Torsemide and blood sugar had been elevated, therefore was recommended they go back to her previous Lasix 40mg  BID.   In talking with the patient and her son today, he reports that she was admitted to [facility] last month for a GI bleed. He brought with him today a copy of part of her discharge information and it appears her GI bleed was thought to be secondary to 2 polyps along the right colon  Records have not been sent yet for for review. Plavix was discontinued during that admission and ASA was titrated from 81 mg daily to 325 mg daily. She was also started on Losartan 100 mg daily for BP control along with Toprol-XL 25 mg daily. Instructions did not state to stop  Lisinopril, therefore she has been taking this as well.   Since returning home, she denies any evidence of recurrent bleeding. No recent reports of chest pain, dyspnea on exertion, orthopnea, PND or palpitations. She did experience significant edema following hospital discharge but this has now returned to baseline and weight has been stable.  She typically takes Lasix 40 mg in AM/20mg  in PM but sometimes takes 40mg  in PM as well if swelling has worsened.   Past Medical History:  Diagnosis Date  . Anemia   . Aortic stenosis, severe 08/2014   a. s/p AVR with 21 mm [facility] pericardial tissue valve   . Blood transfusion   . Bradycardia, sinus   . CAD (coronary artery disease)    a. s/p DES to mid-LAD in 2012 and 2015 b. CABG with RIMA-RCA in 08/2014 at the time of AVR c. 10/2016: cath showing patent RIMA-RCA and patent stents along LAD with mild-nonobstructive disease along LCx.   . Cancer ([place])    ""had hysterectomy for a 6 showing cancer; think that's pretty strong  . Complication of anesthesia    "felt like I was smothering once with mask on my face"  . Diabetes mellitus   . Dyslipidemia   . Ejection fraction    EF 65%, echo, April, 2012, aortic valve sclerosis with very slight gradient  . Femoral bruit    01/2011 hosp, but no pseudoaneurysm or AV fistula  .  GERD (gastroesophageal reflux disease)   . Groin hematoma    April, 2011  . Hypertension   . Myocardial infarction [facility]) 2011; 08/2010  . Osteoarthritis    arthroscopic surgery right knee February, 2012  . Pancreas cyst    The patient has multiple pancreatic cysts.  This is followed at [facility]         . Polymyalgia rheumatica ([place])   . Preoperative evaluation to rule out surgical contraindication    Patient needs back surgery by Dr. Carloyn Manner, May, 2012                . RBBB (right bundle branch block)    old  . Renal insufficiency   . Schatzki's ring   . Shortness of breath on exertion   . Spinal stenosis    history  of surgery for this  . Stroke ([place]) 01/18/11   "I've had 2 TIA's"    Past Surgical History:  Procedure Laterality Date  . ABDOMINAL HYSTERECTOMY  ~ 1974  . AORTIC VALVE REPLACEMENT N/A 08/22/2014   Procedure: AORTIC VALVE REPLACEMENT (AVR);  Surgeon: Melrose Nakayama, MD;  Location: [place];  Service: Open Heart Surgery;  Laterality: N/A;  . APPENDECTOMY    . BACK SURGERY    . CARDIAC CATHETERIZATION  01/18/11  . CARDIAC CATHETERIZATION N/A 08/16/2014   Procedure: Left Heart Cath and Coronary Angiography;  Surgeon: Leonie Man, MD;  Location: [place] CV LAB;  Service: Cardiovascular;  Laterality: N/A;  . CARDIAC CATHETERIZATION N/A 08/16/2014   Procedure: Coronary Stent Intervention;  Surgeon: Leonie Man, MD;  Location: [place] CV LAB;  Service: Cardiovascular;  Laterality: N/A;  . CARDIAC CATHETERIZATION N/A 08/17/2014   Procedure: Coronary/Graft Atherectomy;  Surgeon: Burnell Blanks, MD;  Location: [place] CV LAB;  Service: Cardiovascular;  Laterality: N/A;  . CHOLECYSTECTOMY  1987  . CORONARY ANGIOGRAM  01/18/2011   Procedure: CORONARY ANGIOGRAM;  Surgeon: Larey Dresser, MD;  Location: [facility] CATH LAB;  Service: Cardiovascular;;  . CORONARY ANGIOPLASTY WITH STENT PLACEMENT  2011   "in [place]"  . CORONARY ANGIOPLASTY WITH STENT PLACEMENT  08/2010; 03/25/2013  . CORONARY ARTERY BYPASS GRAFT N/A 08/22/2014   Procedure: CORONARY ARTERY BYPASS GRAFTING (CABG), ON PUMP, TIMES ONE, USING RIGHT INTERNAL MAMMARY ARTERY;  Surgeon: Melrose Nakayama, MD;  Location: [place];  Service: Open Heart Surgery;  Laterality: N/A;  . DILATION AND CURETTAGE OF UTERUS    . FEMORAL ARTERY EXPLORATION Right 03/26/2013   Procedure: FEMORAL ARTERY EXPLORATION WITH SUTURE REPAIR; EVACUATION OF HEMATOMA;  Surgeon: Mal Misty, MD;  Location: [place];  Service: Vascular;  Laterality: Right;  . KNEE ARTHROSCOPY  02/2010   right knee; chrondoplasty medial femoral condyle;  Partial medial  meniscectomy.   Marland Kitchen LEFT AND RIGHT HEART CATHETERIZATION WITH CORONARY ANGIOGRAM N/A 03/25/2013   Procedure: LEFT AND RIGHT HEART CATHETERIZATION WITH CORONARY ANGIOGRAM;  Surgeon: Burnell Blanks, MD;  Location: [facility] CATH LAB;  Service: Cardiovascular;  Laterality: N/A;  . LEFT HEART CATH AND CORS/GRAFTS ANGIOGRAPHY N/A 11/18/2016   Procedure: LEFT HEART CATH AND CORS/GRAFTS ANGIOGRAPHY;  Surgeon: Burnell Blanks, MD;  Location: [place] CV LAB;  Service: Cardiovascular;  Laterality: N/A;  . LUMBAR SPINE SURGERY  ~ 1977; ~ 2010   "2 hugh ruptured discs; I was paralyzed first OR; 2nd OR by Dr. Carloyn Manner, don't know what for"  . TEE WITHOUT CARDIOVERSION N/A 08/22/2014   Procedure: TRANSESOPHAGEAL ECHOCARDIOGRAM (TEE);  Surgeon: Melrose Nakayama,  MD;  Location: [facility] OR;  Service: Open Heart Surgery;  Laterality: N/A;  . TUBAL LIGATION  1970's    Current Medications: Outpatient Medications Prior to Visit  Medication Sig Dispense Refill  . amLODipine (NORVASC) 10 MG tablet Take 10 mg by mouth daily.      . Cholecalciferol (VITAMIN D-3) 1000 units CAPS Take 1 capsule by mouth daily.    . divalproex (DEPAKOTE) 500 MG 24 hr tablet Take 500 mg by mouth daily.      . furosemide (LASIX) 40 MG tablet Take 1 tablet (40 mg total) by mouth 2 (two) times daily. 180 tablet 3  . glimepiride (AMARYL) 4 MG tablet Take 4 mg by mouth daily.  0  . HYDROcodone-acetaminophen (NORCO/VICODIN) 5-325 MG tablet Take 1 tablet every 6 hours as needed    . Insulin Glargine (LANTUS SOLOSTAR) 100 UNIT/ML Solostar Pen Inject 16 Units into the skin every morning.     . insulin lispro (HUMALOG KWIKPEN) 100 UNIT/ML KiwkPen Inject 7 Units into the skin 3 (three) times daily before meals.     . isosorbide mononitrate (IMDUR) 60 MG 24 hr tablet Take 60 mg by mouth daily.    Marland Kitchen latanoprost (XALATAN) 0.005 % ophthalmic solution Place 1 drop into both eyes at bedtime.      Marland Kitchen losartan (COZAAR) 100 MG tablet Take 100 mg by mouth  daily.    . metoprolol succinate (TOPROL-XL) 25 MG 24 hr tablet Take 25 mg by mouth daily.    . pantoprazole (PROTONIX) 40 MG tablet Take 40 mg by mouth daily.    . predniSONE (DELTASONE) 5 MG tablet Take 5 mg by mouth daily. as directed  0  . rosuvastatin (CRESTOR) 20 MG tablet Take 20 mg by mouth daily.    . timolol (TIMOPTIC) 0.5 % ophthalmic solution Place 1 drop into both eyes daily.     Marland Kitchen aspirin 325 MG EC tablet Take 325 mg by mouth daily.    Marland Kitchen lisinopril (PRINIVIL,ZESTRIL) 20 MG tablet Take 20 mg by mouth.    Marland Kitchen aspirin 81 MG tablet Take 81 mg by mouth daily.      . clopidogrel (PLAVIX) 75 MG tablet TAKE 1 TABLET BY MOUTH ONCE DAILY WITH  BREAKFAST 90 tablet 3  . isosorbide mononitrate (IMDUR) 60 MG 24 hr tablet Take 1.5 tablets (90 mg total) daily by mouth. (Patient taking differently: Take 60 mg by mouth daily. ) 45 tablet 11   No facility-administered medications prior to visit.     Allergies:   Clarithromycin, Codeine, Fentanyl, Morphine, Statins, Sulfa antibiotics, and Atorvastatin   Social History   Socioeconomic History  . Marital status: Married    Spouse name: Not on file  . Number of children: Not on file  . Years of education: Not on file  . Highest education level: Not on file  Occupational History  . Not on file  Tobacco Use  . Smoking status: Never Smoker  . Smokeless tobacco: Never Used  Substance and Sexual Activity  . Alcohol use: No    Alcohol/week: 0.0 standard drinks  . Drug use: No  . Sexual activity: Never    Birth control/protection: Post-menopausal  Other Topics Concern  . Not on file  Social History Narrative  . Not on file   Social Determinants of Health   Financial Resource Strain:   . Difficulty of Paying Living Expenses: Not on file  Food Insecurity:   . Worried About Charity fundraiser in the Last Year:  Not on file  . Ran Out of Food in the Last Year: Not on file  Transportation Needs:   . Lack of Transportation (Medical): Not on  file  . Lack of Transportation (Non-Medical): Not on file  Physical Activity:   . Days of Exercise per Week: Not on file  . Minutes of Exercise per Session: Not on file  Stress:   . Feeling of Stress : Not on file  Social Connections:   . Frequency of Communication with Friends and Family: Not on file  . Frequency of Social Gatherings with Friends and Family: Not on file  . Attends Religious Services: Not on file  . Active Member of Clubs or Organizations: Not on file  . Attends Archivist Meetings: Not on file  . Marital Status: Not on file     Family History:  The patient's family history includes Other (age of onset: 67) in her father; Uterine cancer (age of onset: 54) in her mother.   Review of Systems:   Please see the history of present illness.     General:  No chills, fever, night sweats or weight changes.  Cardiovascular:  No chest pain, dyspnea on exertion, orthopnea, palpitations, paroxysmal nocturnal dyspnea. Positive for lower extremity edema.  Dermatological: No rash, lesions/masses Respiratory: No cough, dyspnea Urologic: No hematuria, dysuria Abdominal:   No nausea, vomiting, diarrhea, bright red blood per rectum, melena, or hematemesis Neurologic:  No visual changes, wkns, changes in mental status. All other systems reviewed and are otherwise negative except as noted above.   Physical Exam:    VS:  BP (!) 149/67   Pulse (!) 54   Ht 5\' 2"  (1.575 m)   Wt 159 lb (72.1 kg)   BMI 29.08 kg/m    General: Well developed, well nourished,female appearing in no acute distress. Head: Normocephalic, atraumatic, sclera non-icteric, no xanthomas, nares are without discharge.  Neck: No carotid bruits. JVD not elevated.  Lungs: Respirations regular and unlabored, without wheezes or rales.  Heart: Regular rate and rhythm. No S3 or S4.  No murmur, no rubs, or gallops appreciated. Abdomen: Soft, non-tender, non-distended with normoactive bowel sounds. No  hepatomegaly. No rebound/guarding. No obvious abdominal masses. Msk:  Strength and tone appear normal for age. No joint deformities or effusions. Extremities: No clubbing or cyanosis. No lower extremity edema. Compression stockings in place.  Distal pedal pulses are 2+ bilaterally. Neuro: Alert and oriented X  2 (person, place). Moves all extremities spontaneously. No focal deficits noted. Psych:  Responds to questions appropriately with a normal affect. Skin: No rashes or lesions noted  Wt Readings from Last 3 Encounters:  12/31/18 159 lb (72.1 kg)  01/08/18 159 lb (72.1 kg)  10/28/17 152 lb (68.9 kg)     Studies/Labs Reviewed:   EKG:  EKG is not ordered today. One was performed at [facility] per their report. Will request a copy of records.   Recent Labs: 01/08/2018: B Natriuretic Peptide 232.0; BUN 31; Creatinine, Ser 1.47; Potassium 4.2; Sodium 139   Lipid Panel    Component Value Date/Time   CHOL 138 11/17/2016 0226   TRIG 63 11/17/2016 0226   HDL 79 11/17/2016 0226   CHOLHDL 1.7 11/17/2016 0226   VLDL 13 11/17/2016 0226   LDLCALC 46 11/17/2016 0226    Additional studies/ records that were reviewed today include:   Cardiac Catheterization: 10/2016  Mid RCA lesion, 100 %stenosed.  RIMA graft was visualized by angiography.  Prox Cx to Dist Cx  lesion, 20 %stenosed.  1st Mrg lesion, 40 %stenosed.  Ost LAD to Mid LAD lesion, 10 %stenosed.  Mid LAD to Dist LAD lesion, 30 %stenosed.   1. Chronic total occlusion mid RCA. The distal RCA fills from left to right collaterals. The free RIMA to the RCA is patent to a small branch.  2. Patent proximal and mid LAD stents with minimal restenosis.  3. Mild non-obstructive disease in the Circumflex artery 4. NSTEMI with diffuse underlying CAD. Pt was hypertensive on arrival to cath lab (220/110). Possible demand ischemia given uncontrolled HTN.   Recommendations: Medical management of CAD. Continue ASA and Plavix. Will  continue hydration post cath for 8 hours. I would monitor x 24 more hours to follow BP and adjust BP medications.    Echocardiogram: 10/2016 Study Conclusions  - Left ventricle: The cavity size was normal. There was mild focal   basal hypertrophy of the septum. Systolic function was normal.   The estimated ejection fraction was in the range of 55% to 60%.   Wall motion was normal; there were no regional wall motion   abnormalities. Doppler parameters are consistent with abnormal   left ventricular relaxation (grade 1 diastolic dysfunction). - Aortic valve: A prosthesis was present and functioning normally.   The prosthesis had a normal range of motion. The sewing ring   appeared normal, had no rocking motion, and showed no evidence of   dehiscence. Peak velocity (S): 211 cm/s. Mean gradient (S): 11 mm   Hg. Valve area (VTI): 0.89 cm^2. Valve area (Vmax): 0.97 cm^2.   Valve area (Vmean): 0.85 cm^2. - Mitral valve: Severely calcified annulus. Mildly thickened   leaflets . There was trivial regurgitation. - Left atrium: The atrium was mildly dilated. Volume/bsa, ES   (1-plane Simpson&'s, A4C): 39 ml/m^2.  Impressions:  - Compared to the prior study, there has been no significant   interval change  Assessment:    1. Coronary artery disease involving native coronary artery of native heart without angina pectoris   2. Chronic diastolic CHF (congestive heart failure) ([place])   3. Severe aortic stenosis, s/p pericardial tissue valve 2016   4. Essential hypertension   5. Stage 3 chronic kidney disease, unspecified whether stage 3a or 3b CKD      Plan:   In order of problems listed above:  1. CAD - She is s/p DES to mid-LAD in 2012 and 2015, CABG with RIMA-RCA in 08/2014 and most recent cath in 10/2016 showing patent proximal LAD stents and known CTO of mid-RCA with dRCA filling from collaterals and patent RIMA-RCA. - She denies any recent chest pain or dyspnea on exertion. -  Plavix was discontinued during her recent admission secondary to a GI bleed. Reviewed with the patient and her son today risks versus benefits of restarting this and given no recent intervention, will remain on ASA only for now.  I did recommend they reduce the dosing from 325 mg daily to 81 mg daily. Continue beta-blocker, Imdur and statin therapy.  2. Chronic Diastolic CHF - by their report, she had significant edema at the time of hospital discharge but this appears close to baseline at the time of examination today. She remains on Lasix 40 mg twice daily.  3. Severe AS  - s/p pericardial tissue AVR in 08/2014. Echocardiogram in 2018 showed no significant abnormalities as outlined above.  4. HTN - BP is elevated at 149/67 during today's visit but they report this has significantly improved since prior to her  hospitalization as SBP was peaking into the 200's at that time. Unfortunately, she has continued on Lisinopril while also being started on Losartan during her recent admission. Will discontinue Lisinopril at this time. Continue Amlodipine 10mg  daily, Toprol-XL 25 mg daily, Imdur 60 mg daily.  5. Stage 3 CKD - previous baseline creatinine of 1.4 in 2019. The patient's son reports her kidney function was stable by most recent labs. Will request a copy of records from her hospitalization at [facility].   Medication Adjustments/Labs and Tests Ordered: Current medicines are reviewed at length with the patient today.  Concerns regarding medicines are outlined above.  Medication changes, Labs and Tests ordered today are listed in the Patient Instructions below. Patient Instructions  Medication Instructions:  Your physician has recommended you make the following change in your medication:  Decrease Aspirin to 81 mg Daily  Stop Taking Lisinopril   *If you need a refill on your cardiac medications before your next appointment, please call your pharmacy*  Lab Work: NONE   If you have labs  (blood work) drawn today and your tests are completely normal, you will receive your results only by: Marland Kitchen MyChart Message (if you have MyChart) OR . A paper copy in the mail If you have any lab test that is abnormal or we need to change your treatment, we will call you to review the results.  Testing/Procedures: NONE   Follow-Up: At [facility], you and your health needs are our priority.  As part of our continuing mission to provide you with exceptional heart care, we have created designated Provider Care Teams.  These Care Teams include your primary Cardiologist (physician) and Advanced Practice Providers (APPs -  Physician Assistants and Nurse Practitioners) who all work together to provide you with the care you need, when you need it.  Your next appointment:   6 month(s)  The format for your next appointment:   In Person  Provider:   Carlyle Dolly, MD  Other Instructions Thank you for choosing [facility]!       Signed, Erma Heritage, PA-C  12/31/2018 5:41 PM    [address] [place], [place] [address] Phone: [phone] Fax: [phone]

## 2018-12-31 NOTE — Patient Instructions (Signed)
Medication Instructions:  Your physician has recommended you make the following change in your medication:  Decrease Aspirin to 81 mg Daily  Stop Taking Lisinopril   *If you need a refill on your cardiac medications before your next appointment, please call your pharmacy*  Lab Work: NONE   If you have labs (blood work) drawn today and your tests are completely normal, you will receive your results only by: Marland Kitchen MyChart Message (if you have MyChart) OR . A paper copy in the mail If you have any lab test that is abnormal or we need to change your treatment, we will call you to review the results.  Testing/Procedures: NONE   Follow-Up: At [facility], you and your health needs are our priority.  As part of our continuing mission to provide you with exceptional heart care, we have created designated Provider Care Teams.  These Care Teams include your primary Cardiologist (physician) and Advanced Practice Providers (APPs -  Physician Assistants and Nurse Practitioners) who all work together to provide you with the care you need, when you need it.  Your next appointment:   6 month(s)  The format for your next appointment:   In Person  Provider:   Carlyle Dolly, MD  Other Instructions Thank you for choosing [facility]!

## 2019-02-04 DIAGNOSIS — E1121 Type 2 diabetes mellitus with diabetic nephropathy: Secondary | ICD-10-CM | POA: Diagnosis not present

## 2019-02-09 DIAGNOSIS — I10 Essential (primary) hypertension: Secondary | ICD-10-CM | POA: Diagnosis not present

## 2019-02-09 DIAGNOSIS — E7849 Other hyperlipidemia: Secondary | ICD-10-CM | POA: Diagnosis not present

## 2019-02-09 DIAGNOSIS — E1121 Type 2 diabetes mellitus with diabetic nephropathy: Secondary | ICD-10-CM | POA: Diagnosis not present

## 2019-02-09 DIAGNOSIS — K2100 Gastro-esophageal reflux disease with esophagitis, without bleeding: Secondary | ICD-10-CM | POA: Diagnosis not present

## 2019-02-09 DIAGNOSIS — I5032 Chronic diastolic (congestive) heart failure: Secondary | ICD-10-CM | POA: Diagnosis not present

## 2019-03-14 ENCOUNTER — Other Ambulatory Visit: Payer: Self-pay | Admitting: Student

## 2019-05-26 DIAGNOSIS — E1121 Type 2 diabetes mellitus with diabetic nephropathy: Secondary | ICD-10-CM | POA: Diagnosis not present

## 2019-05-26 DIAGNOSIS — I10 Essential (primary) hypertension: Secondary | ICD-10-CM | POA: Diagnosis not present

## 2019-05-26 DIAGNOSIS — I5032 Chronic diastolic (congestive) heart failure: Secondary | ICD-10-CM | POA: Diagnosis not present

## 2019-05-26 DIAGNOSIS — E7849 Other hyperlipidemia: Secondary | ICD-10-CM | POA: Diagnosis not present

## 2019-05-26 DIAGNOSIS — K2100 Gastro-esophageal reflux disease with esophagitis, without bleeding: Secondary | ICD-10-CM | POA: Diagnosis not present

## 2019-05-28 DIAGNOSIS — E1121 Type 2 diabetes mellitus with diabetic nephropathy: Secondary | ICD-10-CM | POA: Diagnosis not present

## 2019-05-28 DIAGNOSIS — E7849 Other hyperlipidemia: Secondary | ICD-10-CM | POA: Diagnosis not present

## 2019-05-28 DIAGNOSIS — I5032 Chronic diastolic (congestive) heart failure: Secondary | ICD-10-CM | POA: Diagnosis not present

## 2019-05-28 DIAGNOSIS — K2100 Gastro-esophageal reflux disease with esophagitis, without bleeding: Secondary | ICD-10-CM | POA: Diagnosis not present

## 2019-05-28 DIAGNOSIS — I10 Essential (primary) hypertension: Secondary | ICD-10-CM | POA: Diagnosis not present

## 2019-07-18 ENCOUNTER — Other Ambulatory Visit: Payer: Self-pay | Admitting: Student

## 2019-08-05 DIAGNOSIS — E119 Type 2 diabetes mellitus without complications: Secondary | ICD-10-CM | POA: Diagnosis not present

## 2019-08-05 DIAGNOSIS — H26493 Other secondary cataract, bilateral: Secondary | ICD-10-CM | POA: Diagnosis not present

## 2019-08-05 DIAGNOSIS — H401133 Primary open-angle glaucoma, bilateral, severe stage: Secondary | ICD-10-CM | POA: Diagnosis not present

## 2019-08-05 DIAGNOSIS — Z794 Long term (current) use of insulin: Secondary | ICD-10-CM | POA: Diagnosis not present

## 2019-08-26 DIAGNOSIS — E1121 Type 2 diabetes mellitus with diabetic nephropathy: Secondary | ICD-10-CM | POA: Diagnosis not present

## 2019-08-26 DIAGNOSIS — N182 Chronic kidney disease, stage 2 (mild): Secondary | ICD-10-CM | POA: Diagnosis not present

## 2019-08-26 DIAGNOSIS — K2100 Gastro-esophageal reflux disease with esophagitis, without bleeding: Secondary | ICD-10-CM | POA: Diagnosis not present

## 2019-08-26 DIAGNOSIS — I10 Essential (primary) hypertension: Secondary | ICD-10-CM | POA: Diagnosis not present

## 2019-08-26 DIAGNOSIS — I5032 Chronic diastolic (congestive) heart failure: Secondary | ICD-10-CM | POA: Diagnosis not present

## 2019-08-26 DIAGNOSIS — E7849 Other hyperlipidemia: Secondary | ICD-10-CM | POA: Diagnosis not present

## 2019-12-02 DIAGNOSIS — Z0000 Encounter for general adult medical examination without abnormal findings: Secondary | ICD-10-CM | POA: Diagnosis not present

## 2019-12-02 DIAGNOSIS — K2100 Gastro-esophageal reflux disease with esophagitis, without bleeding: Secondary | ICD-10-CM | POA: Diagnosis not present

## 2019-12-02 DIAGNOSIS — Z1331 Encounter for screening for depression: Secondary | ICD-10-CM | POA: Diagnosis not present

## 2019-12-02 DIAGNOSIS — E1121 Type 2 diabetes mellitus with diabetic nephropathy: Secondary | ICD-10-CM | POA: Diagnosis not present

## 2019-12-02 DIAGNOSIS — I5032 Chronic diastolic (congestive) heart failure: Secondary | ICD-10-CM | POA: Diagnosis not present

## 2019-12-02 DIAGNOSIS — N182 Chronic kidney disease, stage 2 (mild): Secondary | ICD-10-CM | POA: Diagnosis not present

## 2019-12-02 DIAGNOSIS — E7849 Other hyperlipidemia: Secondary | ICD-10-CM | POA: Diagnosis not present

## 2019-12-02 DIAGNOSIS — I10 Essential (primary) hypertension: Secondary | ICD-10-CM | POA: Diagnosis not present

## 2020-01-03 DIAGNOSIS — I5032 Chronic diastolic (congestive) heart failure: Secondary | ICD-10-CM | POA: Diagnosis not present

## 2020-01-03 DIAGNOSIS — E7849 Other hyperlipidemia: Secondary | ICD-10-CM | POA: Diagnosis not present

## 2020-01-03 DIAGNOSIS — K2100 Gastro-esophageal reflux disease with esophagitis, without bleeding: Secondary | ICD-10-CM | POA: Diagnosis not present

## 2020-01-03 DIAGNOSIS — N182 Chronic kidney disease, stage 2 (mild): Secondary | ICD-10-CM | POA: Diagnosis not present

## 2020-01-03 DIAGNOSIS — I10 Essential (primary) hypertension: Secondary | ICD-10-CM | POA: Diagnosis not present

## 2020-01-03 DIAGNOSIS — E1121 Type 2 diabetes mellitus with diabetic nephropathy: Secondary | ICD-10-CM | POA: Diagnosis not present

## 2020-01-03 DIAGNOSIS — Z0000 Encounter for general adult medical examination without abnormal findings: Secondary | ICD-10-CM | POA: Diagnosis not present

## 2020-01-03 DIAGNOSIS — Z1331 Encounter for screening for depression: Secondary | ICD-10-CM | POA: Diagnosis not present

## 2020-01-12 DIAGNOSIS — K862 Cyst of pancreas: Secondary | ICD-10-CM | POA: Diagnosis not present

## 2020-01-12 DIAGNOSIS — R627 Adult failure to thrive: Secondary | ICD-10-CM | POA: Diagnosis not present

## 2020-01-12 DIAGNOSIS — E785 Hyperlipidemia, unspecified: Secondary | ICD-10-CM | POA: Diagnosis present

## 2020-01-12 DIAGNOSIS — I517 Cardiomegaly: Secondary | ICD-10-CM | POA: Diagnosis not present

## 2020-01-12 DIAGNOSIS — Z8673 Personal history of transient ischemic attack (TIA), and cerebral infarction without residual deficits: Secondary | ICD-10-CM | POA: Diagnosis not present

## 2020-01-12 DIAGNOSIS — Z7982 Long term (current) use of aspirin: Secondary | ICD-10-CM | POA: Diagnosis not present

## 2020-01-12 DIAGNOSIS — K5790 Diverticulosis of intestine, part unspecified, without perforation or abscess without bleeding: Secondary | ICD-10-CM | POA: Diagnosis not present

## 2020-01-12 DIAGNOSIS — K8689 Other specified diseases of pancreas: Secondary | ICD-10-CM | POA: Diagnosis not present

## 2020-01-12 DIAGNOSIS — E860 Dehydration: Secondary | ICD-10-CM | POA: Diagnosis not present

## 2020-01-12 DIAGNOSIS — Z20822 Contact with and (suspected) exposure to covid-19: Secondary | ICD-10-CM | POA: Diagnosis present

## 2020-01-12 DIAGNOSIS — F0280 Dementia in other diseases classified elsewhere without behavioral disturbance: Secondary | ICD-10-CM | POA: Diagnosis present

## 2020-01-12 DIAGNOSIS — R1111 Vomiting without nausea: Secondary | ICD-10-CM | POA: Diagnosis not present

## 2020-01-12 DIAGNOSIS — M353 Polymyalgia rheumatica: Secondary | ICD-10-CM | POA: Diagnosis present

## 2020-01-12 DIAGNOSIS — N3001 Acute cystitis with hematuria: Secondary | ICD-10-CM | POA: Diagnosis not present

## 2020-01-12 DIAGNOSIS — J811 Chronic pulmonary edema: Secondary | ICD-10-CM | POA: Diagnosis not present

## 2020-01-12 DIAGNOSIS — J9811 Atelectasis: Secondary | ICD-10-CM | POA: Diagnosis not present

## 2020-01-12 DIAGNOSIS — I5032 Chronic diastolic (congestive) heart failure: Secondary | ICD-10-CM | POA: Diagnosis not present

## 2020-01-12 DIAGNOSIS — N3000 Acute cystitis without hematuria: Secondary | ICD-10-CM | POA: Diagnosis present

## 2020-01-12 DIAGNOSIS — I110 Hypertensive heart disease with heart failure: Secondary | ICD-10-CM | POA: Diagnosis present

## 2020-01-12 DIAGNOSIS — N390 Urinary tract infection, site not specified: Secondary | ICD-10-CM | POA: Diagnosis not present

## 2020-01-12 DIAGNOSIS — F05 Delirium due to known physiological condition: Secondary | ICD-10-CM | POA: Diagnosis present

## 2020-01-12 DIAGNOSIS — G309 Alzheimer's disease, unspecified: Secondary | ICD-10-CM | POA: Diagnosis present

## 2020-01-12 DIAGNOSIS — I10 Essential (primary) hypertension: Secondary | ICD-10-CM | POA: Diagnosis not present

## 2020-01-12 DIAGNOSIS — I5033 Acute on chronic diastolic (congestive) heart failure: Secondary | ICD-10-CM | POA: Diagnosis not present

## 2020-01-12 DIAGNOSIS — R030 Elevated blood-pressure reading, without diagnosis of hypertension: Secondary | ICD-10-CM | POA: Diagnosis not present

## 2020-01-12 DIAGNOSIS — Z794 Long term (current) use of insulin: Secondary | ICD-10-CM | POA: Diagnosis not present

## 2020-01-12 DIAGNOSIS — Z882 Allergy status to sulfonamides status: Secondary | ICD-10-CM | POA: Diagnosis not present

## 2020-01-12 DIAGNOSIS — R410 Disorientation, unspecified: Secondary | ICD-10-CM | POA: Diagnosis not present

## 2020-01-12 DIAGNOSIS — R059 Cough, unspecified: Secondary | ICD-10-CM | POA: Diagnosis not present

## 2020-01-12 DIAGNOSIS — E119 Type 2 diabetes mellitus without complications: Secondary | ICD-10-CM | POA: Diagnosis present

## 2020-01-12 DIAGNOSIS — K5641 Fecal impaction: Secondary | ICD-10-CM | POA: Diagnosis not present

## 2020-01-12 DIAGNOSIS — K5900 Constipation, unspecified: Secondary | ICD-10-CM | POA: Diagnosis not present

## 2020-01-12 DIAGNOSIS — Z87440 Personal history of urinary (tract) infections: Secondary | ICD-10-CM | POA: Diagnosis not present

## 2020-03-21 DEATH — deceased
